# Patient Record
Sex: Male | Born: 1940 | Race: White | Hispanic: No | Marital: Married | Smoking: Former smoker
Health system: Southern US, Community
[De-identification: ages and names within clinical notes are randomized; demographics above are authoritative.]

## PROBLEM LIST (undated history)

## (undated) DIAGNOSIS — M069 Rheumatoid arthritis, unspecified: Secondary | ICD-10-CM

## (undated) DIAGNOSIS — L570 Actinic keratosis: Secondary | ICD-10-CM

## (undated) DIAGNOSIS — I6529 Occlusion and stenosis of unspecified carotid artery: Secondary | ICD-10-CM

## (undated) DIAGNOSIS — H3320 Serous retinal detachment, unspecified eye: Secondary | ICD-10-CM

## (undated) DIAGNOSIS — H5440 Blindness, one eye, unspecified eye: Secondary | ICD-10-CM

## (undated) DIAGNOSIS — G1221 Amyotrophic lateral sclerosis: Secondary | ICD-10-CM

## (undated) DIAGNOSIS — I639 Cerebral infarction, unspecified: Secondary | ICD-10-CM

## (undated) DIAGNOSIS — K5730 Diverticulosis of large intestine without perforation or abscess without bleeding: Secondary | ICD-10-CM

## (undated) DIAGNOSIS — W19XXXA Unspecified fall, initial encounter: Secondary | ICD-10-CM

## (undated) DIAGNOSIS — Z8679 Personal history of other diseases of the circulatory system: Secondary | ICD-10-CM

## (undated) DIAGNOSIS — I10 Essential (primary) hypertension: Secondary | ICD-10-CM

## (undated) DIAGNOSIS — Y92009 Unspecified place in unspecified non-institutional (private) residence as the place of occurrence of the external cause: Secondary | ICD-10-CM

## (undated) DIAGNOSIS — E785 Hyperlipidemia, unspecified: Secondary | ICD-10-CM

## (undated) DIAGNOSIS — K5900 Constipation, unspecified: Secondary | ICD-10-CM

## (undated) DIAGNOSIS — K219 Gastro-esophageal reflux disease without esophagitis: Secondary | ICD-10-CM

## (undated) HISTORY — DX: Gastro-esophageal reflux disease without esophagitis: K21.9

## (undated) HISTORY — PX: EYE SURGERY: SHX253

## (undated) HISTORY — PX: OTHER SURGICAL HISTORY: SHX169

## (undated) HISTORY — DX: Unspecified fall, initial encounter: W19.XXXA

## (undated) HISTORY — DX: Actinic keratosis: L57.0

## (undated) HISTORY — DX: Essential (primary) hypertension: I10

## (undated) HISTORY — DX: Unspecified place in unspecified non-institutional (private) residence as the place of occurrence of the external cause: Y92.009

## (undated) HISTORY — DX: Diverticulosis of large intestine without perforation or abscess without bleeding: K57.30

## (undated) HISTORY — PX: TOTAL KNEE ARTHROPLASTY: SHX125

## (undated) HISTORY — DX: Hyperlipidemia, unspecified: E78.5

## (undated) HISTORY — DX: Personal history of other diseases of the circulatory system: Z86.79

## (undated) HISTORY — DX: Serous retinal detachment, unspecified eye: H33.20

## (undated) HISTORY — DX: Cerebral infarction, unspecified: I63.9

## (undated) HISTORY — DX: Constipation, unspecified: K59.00

## (undated) HISTORY — DX: Blindness, one eye, unspecified eye: H54.40

## (undated) HISTORY — DX: Occlusion and stenosis of unspecified carotid artery: I65.29

## (undated) HISTORY — DX: Rheumatoid arthritis, unspecified: M06.9

---

## 1978-09-24 HISTORY — PX: CATARACT EXTRACTION: SUR2

## 1998-07-04 ENCOUNTER — Inpatient Hospital Stay (HOSPITAL_COMMUNITY): Admission: AD | Admit: 1998-07-04 | Discharge: 1998-07-11 | Payer: Self-pay | Admitting: Rheumatology

## 1998-07-04 ENCOUNTER — Encounter: Payer: Self-pay | Admitting: Rheumatology

## 1998-09-24 HISTORY — PX: JOINT REPLACEMENT: SHX530

## 1999-09-25 HISTORY — PX: CHOLECYSTECTOMY: SHX55

## 2000-03-21 ENCOUNTER — Other Ambulatory Visit: Admission: RE | Admit: 2000-03-21 | Discharge: 2000-03-21 | Payer: Self-pay | Admitting: Orthopedic Surgery

## 2000-06-24 ENCOUNTER — Emergency Department (HOSPITAL_COMMUNITY): Admission: EM | Admit: 2000-06-24 | Discharge: 2000-06-24 | Payer: Self-pay | Admitting: Emergency Medicine

## 2000-06-28 ENCOUNTER — Inpatient Hospital Stay (HOSPITAL_COMMUNITY): Admission: EM | Admit: 2000-06-28 | Discharge: 2000-06-30 | Payer: Self-pay | Admitting: Emergency Medicine

## 2000-06-28 ENCOUNTER — Encounter: Payer: Self-pay | Admitting: General Surgery

## 2000-06-28 ENCOUNTER — Encounter (INDEPENDENT_AMBULATORY_CARE_PROVIDER_SITE_OTHER): Payer: Self-pay | Admitting: *Deleted

## 2001-04-27 ENCOUNTER — Emergency Department (HOSPITAL_COMMUNITY): Admission: EM | Admit: 2001-04-27 | Discharge: 2001-04-27 | Payer: Self-pay | Admitting: Emergency Medicine

## 2001-05-04 ENCOUNTER — Emergency Department (HOSPITAL_COMMUNITY): Admission: EM | Admit: 2001-05-04 | Discharge: 2001-05-04 | Payer: Self-pay | Admitting: Emergency Medicine

## 2001-09-24 HISTORY — PX: CARPAL TUNNEL RELEASE: SHX101

## 2002-06-05 ENCOUNTER — Encounter: Admission: RE | Admit: 2002-06-05 | Discharge: 2002-06-05 | Payer: Self-pay | Admitting: Otolaryngology

## 2002-06-05 ENCOUNTER — Encounter: Payer: Self-pay | Admitting: Otolaryngology

## 2002-08-07 ENCOUNTER — Ambulatory Visit (HOSPITAL_COMMUNITY): Admission: RE | Admit: 2002-08-07 | Discharge: 2002-08-07 | Payer: Self-pay | Admitting: Ophthalmology

## 2002-09-24 HISTORY — PX: RETINAL DETACHMENT SURGERY: SHX105

## 2003-01-23 ENCOUNTER — Ambulatory Visit (HOSPITAL_COMMUNITY): Admission: EM | Admit: 2003-01-23 | Discharge: 2003-01-24 | Payer: Self-pay | Admitting: Ophthalmology

## 2003-01-23 ENCOUNTER — Encounter: Payer: Self-pay | Admitting: Ophthalmology

## 2003-03-15 ENCOUNTER — Ambulatory Visit (HOSPITAL_COMMUNITY): Admission: RE | Admit: 2003-03-15 | Discharge: 2003-03-16 | Payer: Self-pay | Admitting: Ophthalmology

## 2003-06-08 ENCOUNTER — Ambulatory Visit (HOSPITAL_COMMUNITY): Admission: RE | Admit: 2003-06-08 | Discharge: 2003-06-08 | Payer: Self-pay | Admitting: Orthopedic Surgery

## 2003-06-16 ENCOUNTER — Inpatient Hospital Stay (HOSPITAL_COMMUNITY): Admission: RE | Admit: 2003-06-16 | Discharge: 2003-06-21 | Payer: Self-pay | Admitting: Orthopedic Surgery

## 2003-06-16 ENCOUNTER — Encounter: Payer: Self-pay | Admitting: Orthopedic Surgery

## 2003-06-16 ENCOUNTER — Encounter (INDEPENDENT_AMBULATORY_CARE_PROVIDER_SITE_OTHER): Payer: Self-pay | Admitting: *Deleted

## 2003-10-18 ENCOUNTER — Ambulatory Visit (HOSPITAL_COMMUNITY): Admission: RE | Admit: 2003-10-18 | Discharge: 2003-10-19 | Payer: Self-pay | Admitting: Ophthalmology

## 2004-11-17 ENCOUNTER — Inpatient Hospital Stay (HOSPITAL_COMMUNITY): Admission: RE | Admit: 2004-11-17 | Discharge: 2004-11-21 | Payer: Self-pay | Admitting: Orthopedic Surgery

## 2004-12-26 ENCOUNTER — Ambulatory Visit: Payer: Self-pay | Admitting: Internal Medicine

## 2005-04-27 ENCOUNTER — Ambulatory Visit: Payer: Self-pay | Admitting: Internal Medicine

## 2005-05-04 ENCOUNTER — Ambulatory Visit: Payer: Self-pay | Admitting: Internal Medicine

## 2005-05-22 ENCOUNTER — Ambulatory Visit: Payer: Self-pay | Admitting: Internal Medicine

## 2005-06-01 ENCOUNTER — Ambulatory Visit: Payer: Self-pay | Admitting: Internal Medicine

## 2005-06-01 LAB — HM COLONOSCOPY

## 2005-06-08 ENCOUNTER — Ambulatory Visit: Payer: Self-pay | Admitting: Internal Medicine

## 2005-11-13 ENCOUNTER — Ambulatory Visit: Payer: Self-pay | Admitting: Internal Medicine

## 2005-12-18 ENCOUNTER — Encounter: Admission: RE | Admit: 2005-12-18 | Discharge: 2005-12-18 | Payer: Self-pay | Admitting: Internal Medicine

## 2006-01-09 ENCOUNTER — Ambulatory Visit: Payer: Self-pay | Admitting: Internal Medicine

## 2006-01-12 ENCOUNTER — Ambulatory Visit (HOSPITAL_COMMUNITY): Admission: RE | Admit: 2006-01-12 | Discharge: 2006-01-12 | Payer: Self-pay | Admitting: Internal Medicine

## 2006-01-12 ENCOUNTER — Encounter: Admission: RE | Admit: 2006-01-12 | Discharge: 2006-01-12 | Payer: Self-pay | Admitting: Internal Medicine

## 2006-01-12 ENCOUNTER — Encounter: Payer: Self-pay | Admitting: Vascular Surgery

## 2006-01-15 ENCOUNTER — Ambulatory Visit: Payer: Self-pay

## 2006-01-15 ENCOUNTER — Encounter: Payer: Self-pay | Admitting: Cardiology

## 2006-01-30 ENCOUNTER — Ambulatory Visit: Payer: Self-pay | Admitting: Internal Medicine

## 2006-02-07 ENCOUNTER — Ambulatory Visit: Payer: Self-pay

## 2006-02-16 ENCOUNTER — Encounter: Admission: RE | Admit: 2006-02-16 | Discharge: 2006-02-16 | Payer: Self-pay | Admitting: *Deleted

## 2006-02-27 ENCOUNTER — Ambulatory Visit: Payer: Self-pay | Admitting: Internal Medicine

## 2006-03-01 ENCOUNTER — Encounter (INDEPENDENT_AMBULATORY_CARE_PROVIDER_SITE_OTHER): Payer: Self-pay | Admitting: Cardiology

## 2006-03-01 ENCOUNTER — Ambulatory Visit (HOSPITAL_COMMUNITY): Admission: RE | Admit: 2006-03-01 | Discharge: 2006-03-01 | Payer: Self-pay | Admitting: Cardiology

## 2006-04-01 ENCOUNTER — Ambulatory Visit: Payer: Self-pay | Admitting: Internal Medicine

## 2006-04-10 ENCOUNTER — Ambulatory Visit: Payer: Self-pay | Admitting: Internal Medicine

## 2006-04-25 ENCOUNTER — Ambulatory Visit: Payer: Self-pay | Admitting: Internal Medicine

## 2006-05-10 ENCOUNTER — Ambulatory Visit: Payer: Self-pay | Admitting: Internal Medicine

## 2006-05-24 ENCOUNTER — Ambulatory Visit: Payer: Self-pay | Admitting: Internal Medicine

## 2006-05-30 ENCOUNTER — Ambulatory Visit: Payer: Self-pay | Admitting: Internal Medicine

## 2006-07-03 ENCOUNTER — Ambulatory Visit: Payer: Self-pay | Admitting: Internal Medicine

## 2006-08-02 ENCOUNTER — Ambulatory Visit: Payer: Self-pay | Admitting: Internal Medicine

## 2006-08-20 ENCOUNTER — Encounter (INDEPENDENT_AMBULATORY_CARE_PROVIDER_SITE_OTHER): Payer: Self-pay | Admitting: Cardiology

## 2006-08-20 ENCOUNTER — Ambulatory Visit (HOSPITAL_COMMUNITY): Admission: RE | Admit: 2006-08-20 | Discharge: 2006-08-20 | Payer: Self-pay | Admitting: Cardiology

## 2006-08-21 ENCOUNTER — Emergency Department (HOSPITAL_COMMUNITY): Admission: EM | Admit: 2006-08-21 | Discharge: 2006-08-21 | Payer: Self-pay | Admitting: Emergency Medicine

## 2006-10-30 ENCOUNTER — Ambulatory Visit: Payer: Self-pay | Admitting: Internal Medicine

## 2007-03-24 ENCOUNTER — Encounter: Payer: Self-pay | Admitting: Internal Medicine

## 2007-03-24 DIAGNOSIS — K5730 Diverticulosis of large intestine without perforation or abscess without bleeding: Secondary | ICD-10-CM

## 2007-03-24 DIAGNOSIS — Z8679 Personal history of other diseases of the circulatory system: Secondary | ICD-10-CM | POA: Insufficient documentation

## 2007-03-24 DIAGNOSIS — M069 Rheumatoid arthritis, unspecified: Secondary | ICD-10-CM | POA: Insufficient documentation

## 2007-03-24 DIAGNOSIS — K219 Gastro-esophageal reflux disease without esophagitis: Secondary | ICD-10-CM

## 2007-03-24 HISTORY — DX: Diverticulosis of large intestine without perforation or abscess without bleeding: K57.30

## 2007-03-24 HISTORY — DX: Personal history of other diseases of the circulatory system: Z86.79

## 2007-03-24 HISTORY — DX: Rheumatoid arthritis, unspecified: M06.9

## 2007-03-24 HISTORY — DX: Gastro-esophageal reflux disease without esophagitis: K21.9

## 2007-05-06 ENCOUNTER — Ambulatory Visit: Payer: Self-pay | Admitting: Internal Medicine

## 2007-05-06 LAB — CONVERTED CEMR LAB
AST: 24 units/L (ref 0–37)
Albumin: 3.3 g/dL — ABNORMAL LOW (ref 3.5–5.2)
Basophils Absolute: 0 10*3/uL (ref 0.0–0.1)
Bilirubin Urine: NEGATIVE
Bilirubin, Direct: 0.1 mg/dL (ref 0.0–0.3)
Blood in Urine, dipstick: NEGATIVE
Calcium: 8.7 mg/dL (ref 8.4–10.5)
Chloride: 108 meq/L (ref 96–112)
Cholesterol: 115 mg/dL (ref 0–200)
Eosinophils Absolute: 0.3 10*3/uL (ref 0.0–0.6)
Eosinophils Relative: 4.2 % (ref 0.0–5.0)
GFR calc Af Amer: 71 mL/min
GFR calc non Af Amer: 59 mL/min
Glucose, Bld: 113 mg/dL — ABNORMAL HIGH (ref 70–99)
Glucose, Urine, Semiquant: NEGATIVE
Ketones, urine, test strip: NEGATIVE
Lymphocytes Relative: 32.3 % (ref 12.0–46.0)
MCHC: 34 g/dL (ref 30.0–36.0)
MCV: 92.4 fL (ref 78.0–100.0)
Neutro Abs: 3.5 10*3/uL (ref 1.4–7.7)
Neutrophils Relative %: 55.3 % (ref 43.0–77.0)
PSA: 1.72 ng/mL (ref 0.10–4.00)
Platelets: 274 10*3/uL (ref 150–400)
Protein, U semiquant: NEGATIVE
RBC: 3.79 M/uL — ABNORMAL LOW (ref 4.22–5.81)
Sodium: 140 meq/L (ref 135–145)
Specific Gravity, Urine: 1.015
Total CHOL/HDL Ratio: 4
Triglycerides: 72 mg/dL (ref 0–149)
pH: 6.5

## 2007-06-05 ENCOUNTER — Ambulatory Visit: Payer: Self-pay | Admitting: Internal Medicine

## 2007-06-18 ENCOUNTER — Encounter: Payer: Self-pay | Admitting: Internal Medicine

## 2007-08-08 ENCOUNTER — Ambulatory Visit: Payer: Self-pay | Admitting: Internal Medicine

## 2007-08-08 ENCOUNTER — Encounter: Payer: Self-pay | Admitting: Internal Medicine

## 2007-08-08 ENCOUNTER — Ambulatory Visit: Payer: Self-pay | Admitting: Cardiology

## 2007-08-08 ENCOUNTER — Inpatient Hospital Stay (HOSPITAL_COMMUNITY): Admission: EM | Admit: 2007-08-08 | Discharge: 2007-08-12 | Payer: Self-pay | Admitting: Emergency Medicine

## 2007-08-11 ENCOUNTER — Ambulatory Visit: Payer: Self-pay | Admitting: *Deleted

## 2007-08-11 ENCOUNTER — Encounter: Payer: Self-pay | Admitting: Internal Medicine

## 2007-08-15 ENCOUNTER — Inpatient Hospital Stay (HOSPITAL_COMMUNITY): Admission: RE | Admit: 2007-08-15 | Discharge: 2007-08-18 | Payer: Self-pay | Admitting: *Deleted

## 2007-08-15 ENCOUNTER — Encounter (INDEPENDENT_AMBULATORY_CARE_PROVIDER_SITE_OTHER): Payer: Self-pay | Admitting: *Deleted

## 2007-08-15 DIAGNOSIS — I6529 Occlusion and stenosis of unspecified carotid artery: Secondary | ICD-10-CM

## 2007-08-15 HISTORY — PX: CAROTID ENDARTERECTOMY: SUR193

## 2007-08-15 HISTORY — DX: Occlusion and stenosis of unspecified carotid artery: I65.29

## 2007-08-17 ENCOUNTER — Encounter: Payer: Self-pay | Admitting: Vascular Surgery

## 2007-08-27 ENCOUNTER — Ambulatory Visit: Payer: Self-pay | Admitting: Internal Medicine

## 2007-08-27 DIAGNOSIS — E785 Hyperlipidemia, unspecified: Secondary | ICD-10-CM

## 2007-08-27 HISTORY — DX: Hyperlipidemia, unspecified: E78.5

## 2007-08-27 LAB — CONVERTED CEMR LAB
ALT: 24 units/L (ref 0–53)
AST: 23 units/L (ref 0–37)
Bilirubin, Direct: 0.2 mg/dL (ref 0.0–0.3)
Cholesterol: 139 mg/dL (ref 0–200)
HDL: 23.3 mg/dL — ABNORMAL LOW (ref >39.0)
Total Bilirubin: 0.7 mg/dL (ref 0.3–1.2)
Total Protein: 7.2 g/dL (ref 6.0–8.3)
Triglycerides: 94 mg/dL (ref 0–149)
VLDL: 19 mg/dL (ref 0–40)

## 2007-08-28 ENCOUNTER — Ambulatory Visit: Payer: Self-pay | Admitting: *Deleted

## 2007-08-28 ENCOUNTER — Encounter: Payer: Self-pay | Admitting: Internal Medicine

## 2007-08-29 ENCOUNTER — Telehealth: Payer: Self-pay | Admitting: Internal Medicine

## 2007-09-03 ENCOUNTER — Telehealth: Payer: Self-pay | Admitting: Internal Medicine

## 2007-09-03 ENCOUNTER — Telehealth (INDEPENDENT_AMBULATORY_CARE_PROVIDER_SITE_OTHER): Payer: Self-pay | Admitting: *Deleted

## 2007-09-25 HISTORY — PX: SHOULDER SURGERY: SHX246

## 2007-09-30 ENCOUNTER — Ambulatory Visit: Payer: Self-pay | Admitting: Internal Medicine

## 2007-10-16 ENCOUNTER — Telehealth: Payer: Self-pay | Admitting: Internal Medicine

## 2008-01-07 ENCOUNTER — Ambulatory Visit: Payer: Self-pay | Admitting: Internal Medicine

## 2008-02-03 ENCOUNTER — Telehealth (INDEPENDENT_AMBULATORY_CARE_PROVIDER_SITE_OTHER): Payer: Self-pay | Admitting: *Deleted

## 2008-03-11 ENCOUNTER — Ambulatory Visit: Payer: Self-pay | Admitting: *Deleted

## 2008-04-14 ENCOUNTER — Encounter: Payer: Self-pay | Admitting: Internal Medicine

## 2008-04-29 ENCOUNTER — Encounter: Admission: RE | Admit: 2008-04-29 | Discharge: 2008-04-29 | Payer: Self-pay | Admitting: Orthopedic Surgery

## 2008-06-17 ENCOUNTER — Emergency Department (HOSPITAL_COMMUNITY): Admission: EM | Admit: 2008-06-17 | Discharge: 2008-06-17 | Payer: Self-pay | Admitting: Emergency Medicine

## 2008-06-17 ENCOUNTER — Telehealth: Payer: Self-pay | Admitting: Internal Medicine

## 2008-06-22 ENCOUNTER — Ambulatory Visit: Payer: Self-pay | Admitting: Internal Medicine

## 2008-06-22 DIAGNOSIS — K5900 Constipation, unspecified: Secondary | ICD-10-CM

## 2008-06-22 HISTORY — DX: Constipation, unspecified: K59.00

## 2008-07-01 ENCOUNTER — Telehealth: Payer: Self-pay | Admitting: Internal Medicine

## 2008-07-22 ENCOUNTER — Telehealth: Payer: Self-pay | Admitting: Internal Medicine

## 2008-09-09 ENCOUNTER — Ambulatory Visit: Payer: Self-pay | Admitting: *Deleted

## 2008-11-22 ENCOUNTER — Telehealth: Payer: Self-pay | Admitting: Internal Medicine

## 2008-12-14 ENCOUNTER — Ambulatory Visit: Payer: Self-pay | Admitting: Internal Medicine

## 2008-12-14 DIAGNOSIS — I10 Essential (primary) hypertension: Secondary | ICD-10-CM | POA: Insufficient documentation

## 2008-12-14 HISTORY — DX: Essential (primary) hypertension: I10

## 2008-12-23 ENCOUNTER — Telehealth: Payer: Self-pay | Admitting: Internal Medicine

## 2009-01-12 ENCOUNTER — Encounter: Payer: Self-pay | Admitting: Internal Medicine

## 2009-03-15 ENCOUNTER — Ambulatory Visit: Payer: Self-pay | Admitting: Internal Medicine

## 2009-03-31 ENCOUNTER — Ambulatory Visit: Payer: Self-pay | Admitting: Internal Medicine

## 2009-03-31 DIAGNOSIS — C44300 Unspecified malignant neoplasm of skin of unspecified part of face: Secondary | ICD-10-CM | POA: Insufficient documentation

## 2009-03-31 DIAGNOSIS — C44309 Unspecified malignant neoplasm of skin of other parts of face: Secondary | ICD-10-CM | POA: Insufficient documentation

## 2009-06-07 ENCOUNTER — Encounter: Payer: Self-pay | Admitting: Internal Medicine

## 2009-07-15 ENCOUNTER — Ambulatory Visit: Payer: Self-pay | Admitting: Vascular Surgery

## 2009-07-15 ENCOUNTER — Encounter: Payer: Self-pay | Admitting: Internal Medicine

## 2009-08-24 ENCOUNTER — Ambulatory Visit: Payer: Self-pay | Admitting: Internal Medicine

## 2009-10-13 ENCOUNTER — Encounter: Payer: Self-pay | Admitting: Internal Medicine

## 2009-10-21 ENCOUNTER — Telehealth: Payer: Self-pay | Admitting: Internal Medicine

## 2009-11-10 ENCOUNTER — Telehealth: Payer: Self-pay | Admitting: Internal Medicine

## 2010-02-23 ENCOUNTER — Ambulatory Visit: Payer: Self-pay | Admitting: Internal Medicine

## 2010-02-23 LAB — CONVERTED CEMR LAB
AST: 23 units/L (ref 0–37)
Albumin: 3.8 g/dL (ref 3.5–5.2)
Alkaline Phosphatase: 33 units/L — ABNORMAL LOW (ref 39–117)
Basophils Relative: 0.3 % (ref 0.0–3.0)
Blood in Urine, dipstick: NEGATIVE
CO2: 29 meq/L (ref 19–32)
Calcium: 9.1 mg/dL (ref 8.4–10.5)
Eosinophils Absolute: 0.4 10*3/uL (ref 0.0–0.7)
Glucose, Bld: 83 mg/dL (ref 70–99)
Glucose, Urine, Semiquant: NEGATIVE
HCT: 36.3 % — ABNORMAL LOW (ref 39.0–52.0)
HDL: 31.5 mg/dL — ABNORMAL LOW (ref >39.00)
Hemoglobin: 11.9 g/dL — ABNORMAL LOW (ref 13.0–17.0)
Lymphocytes Relative: 21.9 % (ref 12.0–46.0)
Lymphs Abs: 1.9 10*3/uL (ref 0.7–4.0)
MCHC: 32.9 g/dL (ref 30.0–36.0)
Monocytes Relative: 13.3 % — ABNORMAL HIGH (ref 3.0–12.0)
Neutro Abs: 5.3 10*3/uL (ref 1.4–7.7)
Nitrite: NEGATIVE
Potassium: 4.8 meq/L (ref 3.5–5.1)
RBC: 3.66 M/uL — ABNORMAL LOW (ref 4.22–5.81)
Sodium: 143 meq/L (ref 135–145)
Specific Gravity, Urine: >=1.03
TSH: 1.68 microintl units/mL (ref 0.35–5.50)
Total CHOL/HDL Ratio: 4
Total Protein: 7.1 g/dL (ref 6.0–8.3)
WBC Urine, dipstick: NEGATIVE
pH: 5.5

## 2010-03-02 ENCOUNTER — Ambulatory Visit: Payer: Self-pay | Admitting: Internal Medicine

## 2010-06-29 ENCOUNTER — Telehealth: Payer: Self-pay | Admitting: Internal Medicine

## 2010-07-27 ENCOUNTER — Ambulatory Visit: Payer: Self-pay | Admitting: Internal Medicine

## 2010-07-31 ENCOUNTER — Ambulatory Visit: Payer: Self-pay | Admitting: Vascular Surgery

## 2010-10-26 NOTE — Progress Notes (Signed)
Summary: Pt taking generic Fenofibrate. Having burning sensation in face.  Phone Note Call from Patient Call back at Home Phone [phone]   Caller: spouse - Gaynelle Adu Summary of Call: Pts spouse called and said that pt is taking generic Fenofibrate 134mg  once a day. Pt says that his face to have a burning sensation like sunburn. Pt is wondering if this is normal side effect or if he should discontinue med. Pls advise.  Initial call taken by: Lucy Antigua,  June 29, 2010 11:37 AM  Follow-up for Phone Call        suggest  the patient stopped the medication for two to 3 weeks to see if the burning resolves;  if the burning resolves resume the medication to see if the burning recurs Follow-up by: Gordy Savers  MD,  June 29, 2010 5:21 PM  Additional Follow-up for Phone Call Additional follow up Details #1::        attempt to call - ans mach - LMTCB if questions - instructed to stop med x2-3 wks then restart - call in burning restarts. KIK Additional Follow-up by: Duard Brady LPN,  June 30, 2010 11:31 AM

## 2010-10-26 NOTE — Progress Notes (Signed)
Summary: constipation  Phone Note Call from Patient   Caller: Spouse Call For: Brett Savers  MD Summary of Call: c/o constipation, using Dulcolax stool softener, but needs something more. Initial call taken by: Raechel Ache, RN,  November 10, 2009 11:58 AM  Follow-up for Phone Call        miralax (polyethylene glycol) 17 gm (one scoop)  with 8 oz of water twice daily as needed Follow-up by: Brett Savers  MD,  November 10, 2009 12:45 PM  Additional Follow-up for Phone Call Additional follow up Details #1::        Rx Called In Additional Follow-up by: Raechel Ache, RN,  November 10, 2009 1:50 PM    New/Updated Medications: MIRALAX  POWD (POLYETHYLENE GLYCOL 3350) 1 scoop in 8oz water two times a day Prescriptions: MIRALAX  POWD (POLYETHYLENE GLYCOL 3350) 1 scoop in 8oz water two times a day  #17 x 3   Entered by:   Raechel Ache, RN   Authorized by:   Brett Savers  MD   Signed by:   Raechel Ache, RN on 11/10/2009   Method used:   Electronically to        [facility] [phone]* (retail)       [address]       [place], [place]  [address]       Ph: [phone] or [phone]       Fax: [phone]   RxID:   [id number]

## 2010-10-26 NOTE — Progress Notes (Signed)
Summary: Requesting Rx  Phone Note Call from Patient Call back at Home Phone [phone]   Caller: Spouse - Sherilyn Cooter Reason for Call: Referral Summary of Call: Pt was given sample of medicine to help build good cholesterol. Would like a  rx for generic fenofibrate capsules since this is cheaper with my insurance. Initial call taken by: Trixie Dredge,  October 21, 2009 4:31 PM  Follow-up for Phone Call        Rx Called In Follow-up by: Raechel Ache, RN,  October 21, 2009 5:10 PM    New/Updated Medications: FENOFIBRATE MICRONIZED 134 MG CAPS (FENOFIBRATE MICRONIZED) 1 once daily Prescriptions: FENOFIBRATE MICRONIZED 134 MG CAPS (FENOFIBRATE MICRONIZED) 1 once daily  #90 x 3   Entered by:   Raechel Ache, RN   Authorized by:   Gordy Savers  MD   Signed by:   Raechel Ache, RN on 10/21/2009   Method used:   Electronically to        [facility] [phone]* (retail)       [address]       [place], [place]  [address]       Ph: [phone] or [phone]       Fax: [phone]   RxID:   [id number]  OK  145 mg #90 one daily

## 2010-10-26 NOTE — Letter (Signed)
Summary: Rheumatology-Dr. Stacey Drain  Rheumatology-Dr. Stacey Drain   Imported By: Maryln Gottron 11/17/2009 12:20:36  _____________________________________________________________________  External Attachment:    Type:   Image     Comment:   External Document

## 2010-10-26 NOTE — Assessment & Plan Note (Signed)
Summary: cpx/mm   Vital Signs:  Patient profile:   70 year old male Height:      71.25 inches Weight:      191 pounds BMI:     26.55 Temp:     98.1 degrees F oral BP sitting:   140 / 70  (right arm) Cuff size:   regular  Vitals Entered By: Duard Brady LPN (March 02, 1609 9:07 AM) CC: cpx - doing well Is Patient Diabetic? No   CC:  cpx - doing well.  History of Present Illness: 70 year old patient who is seen today for a comprehensive evaluation.  He is followed closely by rheumatology for rheumatoid arthritis.  He has mild dyslipidemia with a low HDL cholesterol.  He is on fibroid therapy.  He has a history of cerebrovascular disease, gastroesophageal reflux disease and treated hypertension.  Laboratory studies were reviewed Here for Medicare AWV:  1.   Risk factors based on Past M, S, F history: for vascular risk factors include hyperlipidemia, and hypertension.  He has a prior history of cerebrovascular disease 2.   Physical Activities: remains quite active in spite of his arthritis.  No real exercise limitations although tires easily 3.   Depression/mood: no history depression 4.   Hearing: no deficits 5.   ADL's: completely independent in all aspects of daily living 6.   Fall Risk: low 7.   Home Safety: no problems identified 8.   Height, weight, &visual acuity:no change in his height or weight.  Blind right eye 9.   Counseling: regular exercise, heart healthy diet.  Discussed 10.   Labs ordered based on risk factors: laboratory profile, including lipid panel, PSA, reviewed 11.           Referral Coordination-rheumatologic follow-up as planned 12.           Care Plan- heart healthy diet;  regular exercise regimen encouraged 13.            Cognitive Assessment- alert and oriented, with normal affect   Preventive Screening-Counseling & Management  Alcohol-Tobacco     Smoking Status: quit  Allergies: 1)  ! * Dopamine  Past History:  Past Medical  History: Reviewed history from 12/14/2008 and no changes required. Rheumatoid arthritis Cerebrovascular accident, hx of Diverticulosis, colon GERD blindness right eye Hyperlipidemia status post right hemispheric stroke November 2008 history retinal detachment elevated liver function studies Hypertension  Past Surgical History: Reviewed history from 01/07/2008 and no changes required. Cholecystectomy 2001 status post bilateral total knee replacement history retinal detachment, right eye status post left carpal tunnel release 2003 colonoscopy.  2006 status post right carotid endarterectomy November 2008 2-D echocardiogram April 2007: ejection fraction 60 to 65%; no wall motion abnormalities  Family History: Reviewed history from 06/05/2007 and no changes required. father died age 56, thoracic aneurysm mother died at 34 3 sisters, one status post bypass surgery  Social History: Reviewed history and no changes required. Married 5 grandchildren   Review of Systems  The patient denies anorexia, fever, weight loss, weight gain, vision loss, decreased hearing, hoarseness, chest pain, syncope, dyspnea on exertion, peripheral edema, prolonged cough, headaches, hemoptysis, abdominal pain, melena, hematochezia, severe indigestion/heartburn, hematuria, incontinence, genital sores, muscle weakness, suspicious skin lesions, transient blindness, difficulty walking, depression, unusual weight change, abnormal bleeding, enlarged lymph nodes, angioedema, breast masses, and testicular masses.    Physical Exam  General:  Well-developed,well-nourished,in no acute distress; alert,appropriate and cooperative throughout examination; 130/74 Head:  Normocephalic and atraumatic without obvious abnormalities. No  apparent alopecia or balding. Eyes:  No corneal or conjunctival inflammation noted. EOMI. Perrla. Funduscopic exam benign, without hemorrhages, exudates or papilledema. Vision grossly  normal. Ears:  External ear exam shows no significant lesions or deformities.  Otoscopic examination reveals clear canals, tympanic membranes are intact bilaterally without bulging, retraction, inflammation or discharge. Hearing is grossly normal bilaterally. Nose:  External nasal examination shows no deformity or inflammation. Nasal mucosa are pink and moist without lesions or exudates. Mouth:  Oral mucosa and oropharynx without lesions or exudates.  Teeth in good repair. Neck:  No deformities, masses, or tenderness noted. Chest Wall:  No deformities, masses, tenderness or gynecomastia noted. Breasts:  No masses or gynecomastia noted Lungs:  Normal respiratory effort, chest expands symmetrically. Lungs are clear to auscultation, no crackles or wheezes. Heart:  Normal rate and regular rhythm. S1 and S2 normal without gallop, murmur, click, rub or other extra sounds. Abdomen:  Bowel sounds positive,abdomen soft and non-tender without masses, organomegaly or hernias noted. Rectal:  No external abnormalities noted. Normal sphincter tone. No rectal masses or tenderness. Genitalia:  Testes bilaterally descended without nodularity, tenderness or masses. No scrotal masses or lesions. No penis lesions or urethral discharge. Prostate:  2+ enlarged.  2+ enlarged.   Msk:  No deformity or scoliosis noted of thoracic or lumbar spine.   Pulses:  R and L carotid,radial,femoral,dorsalis pedis and posterior tibial pulses are full and equal bilaterally Extremities:  No clubbing, cyanosis, edema, or deformity noted with normal full range of motion of all joints.   Neurologic:  No cranial nerve deficits noted. Station and gait are normal. Plantar reflexes are down-going bilaterally. DTRs are symmetrical throughout. Sensory, motor and coordinative functions appear intact. Skin:  Intact without suspicious lesions or rashes Cervical Nodes:  No lymphadenopathy noted Axillary Nodes:  No palpable lymphadenopathy Inguinal  Nodes:  No significant adenopathy Psych:  Cognition and judgment appear intact. Alert and cooperative with normal attention span and concentration. No apparent delusions, illusions, hallucinations   Impression & Recommendations:  Problem # 1:  HYPERTENSION (ICD-401.9)  The following medications were removed from the medication list:    Chlorthalidone 25 Mg Tabs (Chlorthalidone) ..... One daily  Orders: EKG w/ Interpretation (93000)  The following medications were removed from the medication list:    Chlorthalidone 25 Mg Tabs (Chlorthalidone) ..... One daily  Problem # 2:  HYPERLIPIDEMIA (ICD-272.4)  The following medications were removed from the medication list:    Tricor 145 Mg Tabs (Fenofibrate) .Marland Kitchen... 1 tablet by mouth once a day His updated medication list for this problem includes:    Fenofibrate Micronized 134 Mg Caps (Fenofibrate micronized) .Marland Kitchen... 1 once daily  The following medications were removed from the medication list:    Tricor 145 Mg Tabs (Fenofibrate) .Marland Kitchen... 1 tablet by mouth once a day His updated medication list for this problem includes:    Fenofibrate Micronized 134 Mg Caps (Fenofibrate micronized) .Marland Kitchen... 1 once daily  Problem # 3:  DIVERTICULOSIS, COLON (ICD-562.10)  Problem # 4:  GERD (ICD-530.81)  His updated medication list for this problem includes:    Pepcid 20 Mg Tabs (Famotidine) .Marland Kitchen... 1 once daily  His updated medication list for this problem includes:    Pepcid 20 Mg Tabs (Famotidine) .Marland Kitchen... 1 once daily  Complete Medication List: 1)  Humira 40 Mg/0.6ml Kit (Adalimumab) .... Q week 2)  Bayer Aspirin 325 Mg Tabs (Aspirin) .Marland Kitchen.. 1 once daily 3)  Foltx 2.5-25-2 Mg Tabs (Fa-pyridoxine-cyancobalamin) .Marland Kitchen.. 1 once daily 4)  Xalatan  0.005 % Soln (Latanoprost) .... Ou once daily 5)  Timoptic 0.5 % Soln (Timolol maleate) .... Ou two times a day 6)  Methotrexate (anti-rheumatic) 2.5 Mg Tabs (Methotrexate (anti-rheumatic)) .... As dir 7)   Oxycodone-acetaminophen 5-325 Mg Tabs (Oxycodone-acetaminophen) .Marland Kitchen.. 1 q4h as needed 8)  Pepcid 20 Mg Tabs (Famotidine) .Marland Kitchen.. 1 once daily 9)  Fenofibrate Micronized 134 Mg Caps (Fenofibrate micronized) .Marland Kitchen.. 1 once daily 10)  Miralax Powd (Polyethylene glycol 3350) .Marland Kitchen.. 1 scoop in 8oz water two times a day  Other Orders: First annual wellness visit with prevention plan  (Z6109) Dermatology Referral (Derma)  Patient Instructions: 1)  Please schedule a follow-up appointment in 6 months. 2)  Limit your Sodium (Salt) to less than 2 grams a day(slightly less than 1/2 a teaspoon) to prevent fluid retention, swelling, or worsening of symptoms. 3)  It is important that you exercise regularly at least 20 minutes 5 times a week. If you develop chest pain, have severe difficulty breathing, or feel very tired , stop exercising immediately and seek medical attention. 4)  Check your Blood Pressure regularly. If it is above:150/90 you should make an appointment. Prescriptions: MIRALAX  POWD (POLYETHYLENE GLYCOL 3350) 1 scoop in 8oz water two times a day  #17 x 3   Entered and Authorized by:   Gordy Savers  MD   Signed by:   Gordy Savers  MD on 03/02/2010   Method used:   Electronically to        [facility] [phone]* (retail)       [address]       [place], [place]  [address]       Ph: [phone] or [phone]       Fax: [phone]   RxID:   [id number] FENOFIBRATE MICRONIZED 134 MG CAPS (FENOFIBRATE MICRONIZED) 1 once daily  #90 x 6   Entered and Authorized by:   Gordy Savers  MD   Signed by:   Gordy Savers  MD on 03/02/2010   Method used:   Electronically to        [facility] [phone]* (retail)       [address]       [place], [place]  [address]       Ph: [phone] or [phone]       Fax: [phone]   RxID:   [id number] FOLTX 2.5-25-2 MG  TABS (FA-PYRIDOXINE-CYANCOBALAMIN)  1 once daily  #90 x 6   Entered and Authorized by:   Gordy Savers  MD   Signed by:   Gordy Savers  MD on 03/02/2010   Method used:   Electronically to        [facility] [phone]* (retail)       [address]       [place], [place]  [address]       Ph: [phone] or [phone]       Fax: [phone]   RxID:   [id number]

## 2010-10-26 NOTE — Assessment & Plan Note (Signed)
Summary: tetanus ? /dm   Vital Signs:  Patient profile:   70 year old male Weight:      193 pounds Temp:     98.2 degrees F oral BP sitting:   130 / 70  (right arm) Cuff size:   regular  Vitals Entered By: Duard Brady LPN (July 27, 2010 4:25 PM) CC: c/o hit hamd with claw hammer - needs tetnaus Is Patient Diabetic? No   CC:  c/o hit hamd with claw hammer - needs tetnaus.  History of Present Illness: 70 year old patient this traumatized his left wrist area twice over the past week, resulting in skin breakage, and soft tissue ecchymoses.  Is requesting a tetanus shot since it has been over 10 years.  He has a history of RA and is on immunosuppressant therapy.  He has treated hypertension and dyslipidemia.  He states that he has discontinued fenofibrate due to possible side effects  Allergies: 1)  ! * Dopamine  Review of Systems       The patient complains of suspicious skin lesions.  The patient denies anorexia, fever, weight loss, weight gain, vision loss, decreased hearing, hoarseness, chest pain, syncope, dyspnea on exertion, peripheral edema, prolonged cough, headaches, hemoptysis, abdominal pain, melena, hematochezia, severe indigestion/heartburn, hematuria, incontinence, genital sores, muscle weakness, transient blindness, difficulty walking, depression, unusual weight change, abnormal bleeding, enlarged lymph nodes, angioedema, breast masses, and testicular masses.    Physical Exam  General:  Well-developed,well-nourished,in no acute distress; alert,appropriate and cooperative throughout examination Mouth:  Oral mucosa and oropharynx without lesions or exudates.  Teeth in good repair. Neck:  No deformities, masses, or tenderness noted. Lungs:  Normal respiratory effort, chest expands symmetrically. Lungs are clear to auscultation, no crackles or wheezes. Heart:  Normal rate and regular rhythm. S1 and S2 normal without gallop, murmur, click, rub or other extra  sounds. Skin:  areas of soft tissue trauma  on the dorsum of the left hand and wrist.  No signs of wound infection   Impression & Recommendations:  Problem # 1:  CEREBROVASCULAR ACCIDENT, HX OF (ICD-V12.50)  Problem # 2:  HYPERLIPIDEMIA (ICD-272.4)  His updated medication list for this problem includes:    Fenofibrate Micronized 134 Mg Caps (Fenofibrate micronized) .Marland Kitchen... 1 once daily  His updated medication list for this problem includes:    Fenofibrate Micronized 134 Mg Caps (Fenofibrate micronized) .Marland Kitchen... 1 once daily  Complete Medication List: 1)  Humira 40 Mg/0.35ml Kit (Adalimumab) .... Q week 2)  Bayer Aspirin 325 Mg Tabs (Aspirin) .Marland Kitchen.. 1 once daily 3)  Foltx 2.5-25-2 Mg Tabs (Fa-pyridoxine-cyancobalamin) .Marland Kitchen.. 1 once daily 4)  Xalatan 0.005 % Soln (Latanoprost) .... Ou once daily 5)  Timoptic 0.5 % Soln (Timolol maleate) .... Ou two times a day 6)  Methotrexate (anti-rheumatic) 2.5 Mg Tabs (Methotrexate (anti-rheumatic)) .... As dir 7)  Oxycodone-acetaminophen 5-325 Mg Tabs (Oxycodone-acetaminophen) .Marland Kitchen.. 1 q4h as needed 8)  Pepcid 20 Mg Tabs (Famotidine) .Marland Kitchen.. 1 once daily 9)  Fenofibrate Micronized 134 Mg Caps (Fenofibrate micronized) .Marland Kitchen.. 1 once daily 10)  Miralax Powd (Polyethylene glycol 3350) .Marland Kitchen.. 1 scoop in 8oz water two times a day  Other Orders: Tdap => 12yrs IM ([address]109) Admin 1st Vaccine (60454)  Patient Instructions: 1)  Please schedule a follow-up appointment in 4 months. 2)  Limit your Sodium (Salt). 3)  It is important that you exercise regularly at least 20 minutes 5 times a week. If you develop chest pain, have severe difficulty breathing, or feel very tired ,  stop exercising immediately and seek medical attention.   Orders Added: 1)  Est. Patient Level III [16109] 2)  Tdap => 92yrs IM [90715] 3)  Admin 1st Vaccine [60454]   Immunizations Administered:  Tetanus Vaccine:    Vaccine Type: Tdap    Site: left deltoid    Mfr: GlaxoSmithKline    Dose: 0.5  ml    Route: IM    Given by: Duard Brady LPN    Exp. Date: 07/13/2012    Lot #: [id number]    VIS given: 08/11/08 version given July 27, 2010.    Physician counseled: yes   Immunizations Administered:  Tetanus Vaccine:    Vaccine Type: Tdap    Site: left deltoid    Mfr: GlaxoSmithKline    Dose: 0.5 ml    Route: IM    Given by: Duard Brady LPN    Exp. Date: 07/13/2012    Lot #: [id number]    VIS given: 08/11/08 version given July 27, 2010.    Physician counseled: yes

## 2010-11-22 ENCOUNTER — Encounter: Payer: Self-pay | Admitting: Internal Medicine

## 2010-11-23 ENCOUNTER — Encounter: Payer: Self-pay | Admitting: Internal Medicine

## 2010-11-23 ENCOUNTER — Ambulatory Visit (INDEPENDENT_AMBULATORY_CARE_PROVIDER_SITE_OTHER): Payer: MEDICARE | Admitting: Internal Medicine

## 2010-11-23 DIAGNOSIS — I10 Essential (primary) hypertension: Secondary | ICD-10-CM

## 2010-11-23 DIAGNOSIS — Z8679 Personal history of other diseases of the circulatory system: Secondary | ICD-10-CM

## 2010-11-23 DIAGNOSIS — E785 Hyperlipidemia, unspecified: Secondary | ICD-10-CM

## 2010-11-23 MED ORDER — FENOFIBRATE MICRONIZED 134 MG PO CAPS: 134.0000 mg | ORAL_CAPSULE | Freq: Every day | ORAL | Status: DC

## 2010-11-23 MED ORDER — OXYCODONE-ACETAMINOPHEN 5-325 MG PO TABS: 1.0000 | ORAL_TABLET | ORAL | Status: DC | PRN

## 2010-11-23 MED ORDER — PRAVASTATIN SODIUM 40 MG PO TABS: 40.0000 mg | ORAL_TABLET | Freq: Every evening | ORAL | Status: DC

## 2010-11-23 NOTE — Progress Notes (Signed)
  Subjective:    Patient ID: Brett Castaneda, male    DOB: [birth year], 70 y.o.   MRN: [id number]  HPI   70 year old patient who has a history of to revascularize he is. He also has a history of rheumatoid arthritis followed by Dr. Sharyn Lull. He has been on fiber therapy for dyslipidemia in the past but apparently has not been on statin therapy. In view of his history is for vascular disease the risks and benefits of statin therapy discussed. He has a history of hypertension which has been well-controlled. His RA he seems stable on his aggressive medical treatment. He has gastroesophageal reflux disease controlled on Pepcid    Review of Systems  Constitutional: Negative for fever, chills, appetite change and fatigue.  HENT: Negative for hearing loss, ear pain, congestion, sore throat, trouble swallowing, neck stiffness, dental problem, voice change and tinnitus.   Eyes: Negative for pain, discharge and visual disturbance.  Respiratory: Negative for cough, chest tightness, wheezing and stridor.   Cardiovascular: Negative for chest pain, palpitations and leg swelling.  Gastrointestinal: Negative for nausea, vomiting, abdominal pain, diarrhea, constipation, blood in stool and abdominal distention.  Genitourinary: Negative for urgency, hematuria, flank pain, discharge, difficulty urinating and genital sores.  Musculoskeletal: Positive for arthralgias. Negative for myalgias, back pain, joint swelling and gait problem.  Skin: Negative for rash.  Neurological: Negative for dizziness, syncope, speech difficulty, weakness, numbness and headaches.  Hematological: Negative for adenopathy. Does not bruise/bleed easily.  Psychiatric/Behavioral: Negative for behavioral problems and dysphoric mood. The patient is not nervous/anxious.        Objective:   Physical Exam  Constitutional: He is oriented to person, place, and time. He appears well-developed.  HENT:  Head: Normocephalic.  Right Ear: External ear  normal.  Left Ear: External ear normal.  Eyes: Conjunctivae and EOM are normal.  Neck: Normal range of motion.        Status post right carotid endarterectomy  Cardiovascular: Normal rate and normal heart sounds.         Pedal pulses full except for a diminished left posterior tibial pulse  Pulmonary/Chest: Breath sounds normal.  Abdominal: Bowel sounds are normal.  Musculoskeletal: Normal range of motion. He exhibits no edema and no tenderness.  Neurological: He is alert and oriented to person, place, and time.  Psychiatric: He has a normal mood and affect. His behavior is normal.          Assessment & Plan:   cerebrovascular disease stable;   Patient also has evidence of peripheral vascular disease based on clinical exam and a history of mild dyslipidemia. Will start on pravastatin 40 mg daily  Hypertension stable  Rheumatoid arthritis  Gastroesophageal reflux disease   We'll start on pravastatin 40 mg daily and return in 3 months for a lipid panel and SGOT

## 2010-11-23 NOTE — Patient Instructions (Addendum)
Limit your sodium (Salt) intake    It is important that you exercise regularly, at least 20 minutes 3 to 4 times per week.  If you develop chest pain or shortness of breath seek  medical attention.  Return in 3 months for follow-up  

## 2011-01-09 ENCOUNTER — Telehealth: Payer: Self-pay | Admitting: *Deleted

## 2011-01-09 NOTE — Telephone Encounter (Signed)
Stop medication for 2 weeks.  Have patient retry medicine in 2 weeks and notify us if the symptoms return

## 2011-01-09 NOTE — Telephone Encounter (Signed)
Notified pt. 

## 2011-01-09 NOTE — Telephone Encounter (Signed)
Pt just started Pravastatin 3 days ago and had numbness in his mouth and tongue.  What should he do?  [phone] [facility])

## 2011-02-06 NOTE — Assessment & Plan Note (Signed)
OFFICE VISIT   OAKLEN, THIAM  DOB:  April 17, 1941                                       09/09/2008  ZOXWR#:[id number]   The patient continues to follow up for continued surveillance of his  carotid disease.  He had a right carotid endarterectomy for severe  ulcerative plaque and associated strokes in November 2008.  He has a  history of watershed strokes to his right brain.   Duplex scan reveals his right carotid endarterectomy site to be widely  patent.  Mild disease noted in the left internal carotid artery.  Antegrade vertebral flow present bilaterally.   The patient has had no further evidence of strokes.  His function in his  left arm is improved.  No recent visual disturbance.   He appears generally well.  No acute distress, alert and oriented.  BP  149/83, pulse 60 per minute.  His carotids reveal no bruits.  Cranial  nerves intact.  Strength equal bilaterally.   The patient has stable bilateral carotid disease.  Will plan follow-up  again in 1 year with carotid Doppler evaluation.   Balinda Quails, M.D.  Electronically Signed   [facility]MED[facility]  D:  09/09/2008  T:  09/10/2008  Job:  [id number]   cc:   Gordy Savers, MD

## 2011-02-06 NOTE — Consult Note (Signed)
NAME:  Brett Castaneda, Brett Castaneda             ACCOUNT NO.:  [id number]   MEDICAL RECORD NO.:  [id number]          PATIENT TYPE:  INP   LOCATION:  3310                         FACILITY:  [facility]   PHYSICIAN:  Casimiro Needle L. Reynolds, M.D.DATE OF BIRTH:  29-Oct-1940   DATE OF CONSULTATION:  08/17/2007  DATE OF DISCHARGE:                                 CONSULTATION   REQUESTING PHYSICIAN:  Dr.  Cari Caraway   REASON FOR EVALUATION:  Left hemiparesis following right carotid  endarterectomy.   HISTORY OF PRESENT ILLNESS:  This is an inpatient consultation  evaluation of this existing [facility] patient, a 70-  year-old man with a past medical history which includes a right brain  stroke approximately 14 months ago and another right brain TIA about ten  days ago, at which time he was seen by the stroke service, first by Dr.  Nash Shearer and later by Dr. Pearlean Brownie.  At the time of his original stroke, he  was placed on Coumadin for an aortic atheroma, and has been on Plavix  for about a year.  He presented on August 06, 2007 with left upper  extremity tingling and clumsiness which lasted a couple of hours and  resolved.  His MRI at that time showed watershed infarcts in the right  MCA territory.  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  The patient says that since his carotid endarterectomy, he has had some  problems.  The first thing that he has noticed is that his vision has  changed.  He says he is not seeing things off to his left as well as he  did.  The right is somewhat blurry, but the left he is not really seeing  well at all and he is having difficulty reading.  He does feel this is a  new change.  He does have an extensive ophthalmologic history including  blindness in his right eye and multiple retinal detachments in the  past.  He has also been noticed to have a left facial droop, and has noticed  that his left hand is somewhat clumsy.  He says when he brings it to his  face, he is not sure that he is going to touch his face or poke himself.  He thinks his left leg might have felt a little wobbly yesterday, but  this morning he was able to walk around successfully.  He is not aware  of any sensory changes over the left side when he touches the arm or  face.  He denies any associated chest pain, shortness of breath,  palpitations, headache or alteration of consciousness.  He was seen this  morning by Dr. Edilia Bo, who performed a carotid ultrasound which  demonstrated patency of the endarterectomy site.  He also had a CT of  the head this morning, which shows what appears to be a new stroke in  the right frontal area, and neurological consultation was subsequently  requested.   PAST MEDICAL HISTORY:  Remarkable for the previous stroke and right  carotid disease as above.  He has several other medical problems  including:  1. History of rheumatoid arthritis which he is on methotrexate and      Humira.  2. As well as hyperlipidemia and hyperhomocysteinemia.  3. 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  He sees      Dr. Fawn Kirk in the office.   FAMILY/SOCIAL/REVIEW OF SYSTEMS:  Per the admission H&P of August 15, 2007 as well as the consultation note of August 09, 2007 by Dr.  Nash Shearer which is reviewed.   MEDICATIONS AT DISCHARGE FROM THE HOSPITAL ON August 12, 2007:  He was  taking aspirin, Tricor, Folbee, methotrexate and several drops including  timolol, Xalatan and Alphagan.   In the hospital, he remains on the same medication regimen.   PHYSICAL EXAMINATION:  VITAL SIGNS:  Afebrile, blood pressure 154/60,  pulse 70s, respirations 18, O2 sat 97% on room air.  GENERAL EXAMINATION:  This is a  healthy-appearing man, seated in no  evident distress.  HEAD:  Cranium is normocephalic, atraumatic.  Oropharynx is benign.  NECK:  Supple without carotid bruits.  Postoperative changes of the  right carotid endarterectomy are noted.  There are no supraclavicular  bruits.  The surgical site appears to be healing well.  NEUROLOGIC EXAMINATION:  Mental status:  He is awake, alert.  He is  fully oriented to time, place and person.  Recent memory and remote  memory are intact.  Attention span, concentration and fund of knowledge  are all appropriate.  Speech is fluent and not dysarthric.  He has no  defects to confrontational naming.  He can repeat phrases.  Cranial  nerves:  His right eye is somewhat sclerotic with a dense cataract.  Left eye appears healthy.  The left pupil is 3 mm and reactive.  Examination of extraocular movements demonstrates limitation in leftward  gaze with the left eye, with some preserved rightward and vertical  movement.  The right eye is exotropic and does not move well at all.  Examination of visual fields out of the left eye demonstrates decreased  acuity with inability to count fingers in the left upper quadrant, with  some blurriness elsewhere, but able to perceive motion in finger and  count fingers.  Facial sensation is intact to pinprick.  He has an  obvious left facial droop with lower much greater than upper facial  weakness.  Tongue and palate move normally and symmetrically.  Shoulder  shrug strength is normal.  Motor testing:  Normal bulk and tone.  He has  slight weakness of the left finger extensors and interossei, otherwise  normal strength in the left upper and lower extremities compared to the  right.  Sensory:  He reports an intact pinprick and light touch  sensation over all extremities.  Double-simultaneous stimulation is  intact in the upper extremities, question some inconsistent extinction  on the left in the lower extremities.  Coordination:   Rapid movements  are performed adequately.  He is able to perform finger-to-nose without  definite ataxia, although his vision limits his ability to precisely  perform the maneuver somewhat.  Gait:  He rises easily from the chair  and his stance is a bit wide based.  He is able to ambulate a little bit  in the room with minimal assist.  Reflexes are 2+ and symmetric and toe  is up on the left and down on the right.   LABORATORY REVIEW:  CBC from yesterday:  White count 10.0, hemoglobin  10.4, platelets 213,000.  B-Met from yesterday:  Remarkable for an  elevated glucose of 34 and a slightly low sodium of 132, otherwise  normal.   CT of the head performed this morning is personally reviewed.  The study  is remarkable primarily for what appears to be an area of acute ischemia  in the right posterior frontal area.  There do not appear to be any  other acute events.  When compared to the previous MRI done on August 08, 2007, there was some ischemia in this area seen, but this does  appear to be a larger and more well-defined area of ischemia.   IMPRESSION:  Right frontal stroke with resultant mild left hemiparesis  and leftward lateral gaze limitation.  These would be well explained by  his new infarct seen on his imaging study in the left frontal area.  However, there is some concern about some other visual changes including  decreased acuity and possibly neglect.  This does not correspond very  well to what is seen on the CT today.  The new event seen on the scan  today does not correspond well to what was seen on the MRI and;  therefore, probably occurred perioperatively.   RECOMMENDATIONS:  1. Would continue aspirin daily.  2. Ophthalmology evaluation is in progress.  3. I would suggest checking an MRI of the brain to better compare the      more recent event to what happened ten days ago, and to rule out      any involvement in the right posterior areas which might explain       some left-sided      visual neglect, given that his ocular exam is so difficult due to      blindness in the right eye and other limitations.  4. Stroke service to follow.   Thank you for the consultation.      Michael L. Thad Ranger, M.D.  Electronically Signed     MLR/MEDQ  D:  08/17/2007  T:  08/17/2007  Job:  [id number]   cc:   Pramod P. Pearlean Brownie, MD  Gordy Savers, MD

## 2011-02-06 NOTE — Consult Note (Signed)
NAME:  Brett Castaneda, Brett Castaneda NO.:  [id number]   MEDICAL RECORD NO.:  [id number]          PATIENT TYPE:  INP   LOCATION:  3711                         FACILITY:  [facility]   PHYSICIAN:  Balinda Quails, M.D.    DATE OF BIRTH:  1941/09/14   DATE OF CONSULTATION:  08/11/2007  DATE OF DISCHARGE:                                 CONSULTATION   REFERRING PHYSICIAN:  Pramod P. Pearlean Brownie, MD.   PRIMARY CARE PHYSICIAN:  Gordy Savers, MD.   REASON FOR CONSULTATION:  Right hemispheric stroke.   HISTORY:  Brett Castaneda is a 70 year old Caucasian gentleman with a history  of multiple medical problems including a prior right brain embolic  stroke approximately 14-16 months ago.  At that time he was placed on  Coumadin for 3-6 months due to an aortic atheroma.  He was switched  approximately 1 year ago to Plavix.   The patient presented to hospital at [facility] on August 06, 2007,  with a history of a left upper arm tingling, clumsiness, and left-sided  tongue tingling.  These symptoms lasted between 1 and 2 hours and then  completely resolved.  By the time the patient was seen and evaluated by  neurology for consultation, his symptoms were completely gone.   He denied visual disturbance.  No headaches.  No speech changes.  Denies  swallowing or chewing problems.  No dizziness, vertigo, syncope, or  presyncope.  No history of falls.   PAST MEDICAL HISTORY:  1. Right brain stroke.  2. Hyperlipidemia.  3. Retinal detachment.  4. Rheumatoid arthritis.  5. Gastroesophageal reflux.   MEDICATIONS:  1. Tricor 145 mg daily,  2. Multivitamin 1 tablet daily.  3. Lovenox 40 mg subcu daily.  4. Atenolol 0.5% left eye b.i.d.  5. Xalatan ophthalmic 0.005% left eye nightly.  6. Alphagan 0.2% ophthalmic solution left eye b.i.d.  7. Aggrenox 1 tablet p.o. b.i.d.  8. Foltx 1 tablet daily.  9. Tylenol 500 mg p.o. b.i.d.  10.Tylenol 650  mg p.o. q.4 h. p.r.n.   ALLERGIES:  VICODIN and  MORPHINE.   FAMILY HISTORY:  Both of his parents are divorced.  Mother died at age  12, suddenly in her sleep.  Father died at age 41 of an MI.  The patient  has three sisters who are alive with history of coronary artery disease,  previous coronary bypass surgery, history of MI, diabetes and  hypertension.   SOCIAL HISTORY:  The patient is disabled due to rheumatoid arthritis.  He lives in a [place] home.  He lives with his wife.  He has several  grown children.  He does not smoke or use alcohol.   REVIEW OF SYSTEMS:  See history of presenting illness.  The patient  denies cardiac, respiratory, visual, hearing, gastrointestinal,  genitourinary, or respiratory problems.  He does have joint discomfort  and a history of joint replacement associated with rheumatoid arthritis.  No bleeding problems.   PHYSICAL EXAMINATION:  VITAL SIGNS:  BP is 145/84, pulse 69 per minute,  respirations 18 per minute, temperature is 97.7  O2 saturation 96% on  room air.  GENERAL:  The patient is alert and oriented, appeared to be in no acute  distress.  No cyanosis, pallor, or jaundice.  HEENT:  Normocephalic.  Extraocular movements intact.  Mouth and throat  are clear.  NECK:  Supple.  No thyromegaly or adenopathy.  No carotid bruits.  CARDIOVASCULAR:  Regular rate and rhythm.  Normal heart sounds without  murmurs.  No rubs or gallops.  CHEST:  Clear equal air entry bilaterally.  No rales or rhonchi.  ABDOMEN:  Soft, nontender, no masses, or organomegaly.  Normal bowel  sounds.  No bruits.  MUSCULOSKELETAL:  The patient has a left total knee replacement.  He has  good range of motion in his extremities bilaterally.  SKIN:  Warm and dry.  No rashes or ulcers.  NEUROLOGIC:  The patient is alert, and oriented.  Cranial nerves are  intact.  Strength equal bilaterally.  1+ reflexes bilaterally.  Fine  motion in his left hand is mildly reduced.  Strength in his left hand is  good.  He has clear  speech.   INVESTIGATIONS:  1. MR angiogram of the neck reveals a 50%-70% right ICA stenosis.      There is also an MRI of the brain which does verify evidence of      acute watershed infarcts in the right cerebral hemisphere.  2. Carotid Doppler reveals a moderate right internal carotid artery      stenosis with heterogeneous plaque.   FINAL IMPRESSION:  1. Right hemispheric watershed stroke.  2. Hyperlipidemia.  3. History of retinal detachment.  4. Rheumatoid arthritis.  5. Gastroesophageal reflux.   RECOMMENDATIONS:  The patient has what appears to be an unstable right  carotid bifurcation plaque, and now 2 episodes of embolic stroke.  At  this time, best treatment is right carotid endarterectomy for reduction  of stroke risk.  Continue antiplatelet agents.  Scheduled at this time  for Friday, August 15, 2007 at [facility].      Balinda Quails, M.D.  Electronically Signed     PGH/MEDQ  D:  08/11/2007  T:  08/12/2007  Job:  [id number]   cc:   Gordy Savers, MD  Pramod P. Pearlean Brownie, MD

## 2011-02-06 NOTE — Consult Note (Signed)
NAME:  Brett Castaneda, Brett Castaneda NO.:  [id number]   MEDICAL RECORD NO.:  [id number]          Castaneda TYPE:  INP   LOCATION:  3711                         FACILITY:  [facility]   PHYSICIAN:  Bevelyn Buckles. Champey, M.D.DATE OF BIRTH:  17-Jun-1941   DATE OF CONSULTATION:  DATE OF DISCHARGE:                                 CONSULTATION   STROKE CONSULTATION:   REQUESTING PHYSICIAN:  Dr. Felicity Coyer   REASON FOR CONSULTATION:  Stroke.   HISTORY OF PRESENT ILLNESS:  Brett Castaneda is a 70 year old Caucasian male  with multiple medical problems including prior embolic stroke, was found  to have an aortic atheroma, was placed on Coumadin for three to six  months and then switched to Plavix one year ago.  Castaneda presented to  [facility] hospital on Thursday morning after awaking with left upper extremity  tingling, weakness and tongue tingling.  Symptoms lasted about one to  two hours and then completely resolved.  Castaneda denies any other  symptoms of headaches, vision changes, speech changes, swallowing  problems, chewing problems, dizziness, vertigo, falls or loss of  consciousness.   PAST MEDICAL HISTORY:  Positive for:  1. Stroke.  2. Hyperlipidemia.  3. Toxo/retinal detachment4.  Rheumatoid arthritis.  4. Reflux.   MEDICATIONS:  Include:  1. Methotrexate.  2. Plavix.  3. Tricor.  4. Humira.  5. Timolol.   ALLERGIES:  ARE TO VICODIN.   FAMILY HISTORY:  Positive for stroke and vascular disease/aneurysm.   SOCIAL HISTORY:  Castaneda lives with his wife, quit tobacco 25 years ago,  denies any alcohol use.   Review of systems is positive as per HPI.  Review of systems negative as  per HPI in greater than seven other organ systems.   EXAMINATION:  VITALS:  Temperature is 97.6, pulse is 64, respirations  16, blood pressure 142/76, O2 sat is 98%.  HEENT:  Normocephalic, atraumatic.  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  Face looks symmetric.  NECK:  Supple.  HEART:  Regular.  LUNGS:   Clear.  ABDOMEN:  Soft.  EXTREMITIES:  Show good pulses.  NEUROLOGICAL EXAMINATION:  Castaneda is awake, alert.  Language is fluent.  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  Face is symmetric.  Tongue is midline.  On motor examination, Castaneda has good strength in  all four extremities.  Sensation is within normal limits to light touch.  Reflexes are 1 to 2+ symmetric.  Cerebellar function is within normal  limits finger-to-nose.  Gait was not assessed secondary to safety.   LABS:  Cholesterol is 133, LDL is 90.  Hemoglobin A1c is 5.6.  WBC is  6.1, hemoglobin 12.6, hematocrit is 37.0, platelets 259.  Sodium is 135,  potassium is 4.0, chloride is 108, CO2 is 23, BUN 16, creatinine is  1.26, glucose 127.  LFTs are normal.  Calcium is 8.7.   CT head showed hyperdensity in Brett right internal capsule lentiform  nucleus which is old infarct.   MRI/MRA of Brett brain showed acute watershed infarcts in Brett right  hemisphere.  MRA showed no significant stenosis.  IMPRESSION:  A 70 year old Caucasian male with known history of stroke,  who presents with right hemispheric watershed infarcts.  Symptoms have  clearly resolved.  Castaneda is not a candidate for IV/TPA, as his  symptoms are greater than three hours, however Brett symptoms have  resolved.  Will change his Plavix to Aggrenox.  Will consider  anticoagulation after his studies are done, especially with Brett  Castaneda's history.  Will get 2-D echocardiogram to consider a  transesophageal echocardiogram given his history of aortic atheroma.  Will get carotid Dopplers, MRI of Brett neck, homocysteine level, and will  get PT/OT consults as well.  Will follow Brett Castaneda while he is in [facility]  hospital.      Bevelyn Buckles. Nash Shearer, M.D.  Electronically Signed     DRC/MEDQ  D:  08/09/2007  T:  08/09/2007  Job:  [id number]

## 2011-02-06 NOTE — Op Note (Signed)
NAME:  Brett Castaneda, Brett Castaneda NO.:  [id number]   MEDICAL RECORD NO.:  [id number]          PATIENT TYPE:  INP   LOCATION:  3310                         FACILITY:  [facility]   PHYSICIAN:  Balinda Quails, M.D.    DATE OF BIRTH:  05/28/1941   DATE OF PROCEDURE:  08/15/2007  DATE OF DISCHARGE:                               OPERATIVE REPORT   SURGEON:  Denman George, M.D.   ASSISTANT:  Di Kindle. Edilia Bo, M.D., and Jerold Coombe, P.A.   ANESTHETIC:  Is general endotracheal.   ANESTHESIOLOGIST:  Guadalupe Maple, M.D.   PREOPERATIVE DIAGNOSIS:  Right brain stroke.   POSTOPERATIVE DIAGNOSIS:  Right brain stroke.   PROCEDURE:  Right carotid endarterectomy with Dacron patch angioplasty.   CLINICAL NOTE:  Brett Castaneda is a 70 year old Caucasian male with a history  of multiple medical problems including a prior right brain stroke  approximately 14-16 months ago.  He had represented to the hospital  earlier this week with a recurrent right brain stroke by MRI.  This is  an embolic watershed stroke.  He had evidence of moderate ulcerated  right internal carotid artery plaque.  This has worsened over the past  14-16 months.  He was seen in consultation and agreed to undergo right  carotid endarterectomy for reduction of stroke risk.  Risks and benefits  of the operative procedure were explained in detail with a major  morbidity mortality 1-2% to include but not limited MI, CVA, cranial  nerve injury and death.   OPERATIVE PROCEDURE:  The patient was brought to the operating room in  stable condition.  Placed in supine position.  General endotracheal  anesthesia induced.  Foley catheter and arterial line in place.  Right  neck prepped and draped in a sterile fashion.   Curvilinear skin incision was made along the anterior border of the  right sternomastoid muscle.  Dissection carried down through  subcutaneous tissue with electrocautery.  Platysma incised.  Deep  dissection carried along the anterior border of the sternomastoid,  common carotid artery mobilized down to the omohyoid muscle and  encircled with vessel loop.  Vagus nerve was reflected posteriorly and  preserved.  The distal dissection was carried up to the bulb where the  superior thyroid and external carotid were encircled with vessel loops.  The internal carotid artery followed distally up to the posterior belly  of digastric muscle and encircled with a vessel loop.  The hypoglossal  nerve reflected superiorly and preserved.  The patient administered 7000  units of heparin intravenously.   The carotid bifurcation revealed moderate amount of plaque extending 2-3  cm into the right internal carotid artery.  The patient administered  7000 units of heparin intravenously.  Adequate circulation time  permitted.  Carotid vessels were controlled with clamps.  Longitudinal  arteriotomy made in the distal common carotid artery.  The arteriotomy  extended across carotid bulb up and to the internal carotid artery.  There was a friable plaque with some thrombus present in the bulb.  Approximately 60% stenosis.  A shunt inserted.  The plaques were removed with an elevator.  The endarterectomy carried  down to the common carotid artery, and plaque was divided transversely  with Potts scissors.  Plaque raised up in the bulb, and superior thyroid  and external carotid were endarterectomized using an eversion technique.  The distal internal carotid artery plaque feathered out well.  Fragments  of plaque removed with fine forceps.  Site irrigated with heparin saline  solution.   A patch angioplasty endarterectomy site carried out with a running 6-0  Prolene suture and a Finesse Dacron patch.  At completion of patch  angioplasty, shunt was removed.  All vessels well flushed.  Clamps  removed directing initial antegrade flow up the external carotid artery;  following this, the internal carotid was  released.   Adequate hemostasis obtained.  Sponges and instrument counts correct.  The patient administered 50 mg of protamine intravenously.   Sternomastoid fascia closed running with a 2-0 Vicryl suture.  Platysma  closed with a running 3-0 Vicryl suture.  Skin closed with 4-0 Monocryl.  Dermabond applied.   Anesthesia reversed in the operating room.  The patient awakened  readily.  Moved all extremities to command.  Transferred to recovery  room in stable condition.      Balinda Quails, M.D.  Electronically Signed     PGH/MEDQ  D:  08/15/2007  T:  08/16/2007  Job:  [id number]

## 2011-02-06 NOTE — Discharge Summary (Addendum)
NAME:  Brett Castaneda, Brett Castaneda NO.:  [id number]   MEDICAL RECORD NO.:  [id number]          PATIENT TYPE:  INP   LOCATION:  3310                         FACILITY:  [facility]   PHYSICIAN:  Balinda Quails, M.D.    DATE OF BIRTH:  Nov 06, 1940   DATE OF ADMISSION:  08/15/2007  DATE OF DISCHARGE:                               DISCHARGE SUMMARY   ADDENDUM.    ____ QA MARKER: 29                                                 This is an addendum to a previously dictated discharge summary.  See job  number [id number] for details outlining Brett Castaneda admission and discharge  diagnoses, procedures, history and hospital course to the morning of  August 16, 2007.   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   DIAGNOSTICS FROM August 17, 2007:  1. Carotid duplex preliminary report showing right CEA appears patent.      All velocities within normal limits.  There was no left internal      carotid artery stenosis.  The right vertebral flow was retrograde.      Left vertebral antegrade.  2. The CT scan of the head without contrast showing new right frontal      cortical stroke, about 2 x 3 cm in size.  3. MRA/MRI of the head and brain without contrast showing there was no      evidence of large or medium vessel occlusion or correctable      proximal stenosis, bolus enlargement of the left MCA bifurcation      region that could represent 3-4 mm aneurysm, multiple embolic type      strokes to the right MCA with the largest measuring 2 x 3 cm in the      right posterior frontal region, other centimeter subcentimeter      stroke throughout the right hemisphere included 4 x 5 mm CVA of the      basilar ganglia/posterior limb of the internal capsule.  There is      no evidence of stroke in the left hemisphere brain stem or      cerebellum of an  acute nature.   HISTORY:  From Brett Castaneda previous discharge summary, it was thought  that he was neurologically intact postoperatively.  However, on the  morning of August 17, 2007 he reported that he had actually felt that  he had left upper extremity clumsiness since surgery  and also felt that  he had deficit in his left visual field and a decrease in visual acuity  overall.  Of note, he is blind in his right eye.  Physical exam showed  that showed that he did have left-sided facial droop and limited  peripheral vision in the left eye.  His lower  extremity strength was  strong and symmetrical.  His upper extremity movement did show a slight  decrease in strength on the left.  This prompted stat carotid duplex and  CT scan of the head without contrast with results as discussed above.  With the finding of a new stroke ,we did ask for neurology to be  reconsulted and he was seen by Dr. Kelli Hope.  We also asked for  an  ophthalmology consult as it was not felt that his new stroke well  explained his other visual changes including decreased acuity and  possible neglect.  Neurology did recommended a MRI/MRA of the head with  results as discussed above.  The ophthalmologist recommended that the  patient undergo a formal visual field study on an outpatient basis and  continue follow-up with Dr. Luciana Axe for his retina.   On August 18, 2007 Brett Castaneda was showing improved strength in the left  upper extremity.  In fact, they felt essentially symmetrical.  However,  he still reports some clumsiness of his left upper arm.  He reported  that he was ambulating without difficulty.  His vision was reportedly  the same.  Of note, the ophthalmologist reported that he was able to see  20/30 if objects were held to his right side.  He was not having any  seizure or swallowing difficulties.  He was reevaluated by neurology and  felt that no further stroke workup was indicated; however, that  he  should be evaluated by occupational therapy and perhaps undergo  outpatient therapy.  He is also to see Dr. Luciana Axe on outpatient basis as  well.   At this point it is felt Brett Castaneda is stable from his new stroke.  He is  to be reevaluated by Dr. Madilyn Fireman this morning and anticipate that he  should be ready for discharge home on the afternoon of August 18, 2007  or the morning of August 19, 2007.   DISCHARGE MEDICATIONS:  Please see previously dictated discharge  summary.   DISCHARGE INSTRUCTIONS:  Please see previously dictated discharge  summary.  In addition he was instructed to avoid driving until cleared  by Dr. Luciana Axe.  He is also to schedule a 104-month followup with Dr. Pearlean Brownie  or Dr. Nash Shearer at [facility].  He should call to  make that appointment and is to call and schedule followup with Dr.  Luciana Axe for a formal visual field testing and to continue evaluation of  his retina.  We have also ordered a home health occupational therapy  evaluation.      Jerold Coombe, P.A.      Balinda Quails, M.D.  Electronically Signed    AWZ/MEDQ  D:  08/18/2007  T:  08/18/2007  Job:  [id number]   cc:   Pramod P. Pearlean Brownie, MD  Raenette Rover Felicity Coyer, MD  Gordy Savers, MD  Alford Highland Rankin, M.D.

## 2011-02-06 NOTE — Assessment & Plan Note (Signed)
OFFICE VISIT   Brett Castaneda, Brett Castaneda  DOB:  07/24/1941                                       07/15/2009  XBJYN#:[id number]   The patient presents today for evaluation of his right neck.  He is very  pleasant 70 year old gentleman who is status post right carotid  endarterectomy Dacron patch angioplasty by Dr. Liliane Bade on August 15, 2007.  He had prior right brain stroke.  He had been seen in our  office for routine follow-up.  Recently he reports concern regarding  discomfort in his right neck.  He reports that he has had a sore throat  on and off for the last three months and reported sensation of a  prominent pulsation over his left carotid.  He does report some mild  discomfort with this, but more sore throat.  He reports that the  swelling that is associated with this does come and go and does not  appear to be related to the pain.  He reports that the pain is  relatively mild and is not related to any specific events.  He does  report that he had a fever blister on his lip but does not note any  association with this.  He continues to be a nonsmoker having quit in  1982.  Does not drink alcohol on a regular basis.  Review of systems  unchanged.  He continues to have difficulty with reflux and arthritic  joint pain.  He is blind in his right eye.   PHYSICAL EXAM:  A well-developed, well-nourished white male appearing  stated age.  He is a well-developed, well-nourished in no acute  distress.  He does have a blind in right eye which does not focus. Neck:  Reveals no lymphadenopathy.  He does have a prominent carotid pulses  under his old carotid incision.  He does not have bruits bilaterally.  He has no clubbing, cyanosis or edema.  He has no skin rashes.  He is  grossly intact neurologically.   I discussed the significance of his new issue regarding fullness and  soreness in his right neck.  I explained that we have reviewed his  duplex with him today  which he did take in our office.  This reveals no  evidence of carotid artery stenosis and does not show any evidence of  false aneurysm.  I explained that I feel his fullness and discomfort is  not related to his carotid surgery since he does not have any evidence  of false aneurysm or other issue.  I explained that if he does persist  to have a sore throat that I would suggest that he discuss this further  with Dr. Amador Cunas to determine if further workup is indicated.  He  understands this and will see [place] again in our vascular laboratory for a  yearly carotid protocol follow-up.  He will also notify us should he  develop any neurologic deficits.   Larina Earthly, M.D.  Electronically Signed   TFE/MEDQ  D:  07/15/2009  T:  07/18/2009  Job:  [id number]   cc:   Gordy Savers, MD

## 2011-02-06 NOTE — Assessment & Plan Note (Signed)
OFFICE VISIT   BURRIS, MATHERNE  DOB:  12-Apr-1941                                       08/28/2007  NWGNF#:[id number]   The patient returned to the office today after undergoing a right  carotid endarterectomy for severe right carotid stenosis associated with  multiple right hemispheric watershed strokes.  Following surgery, he did  have evidence of several embolic strokes in his right brain, some of  these may have occurred during surgery due to the fact he had a very  unstable plaque.  When the artery was opened, there was actually free  atherosclerotic debris and clot in his right internal carotid artery.   Fortunately, he has made a significant functional recovery.  He is  living at home with his wife.  Still some visual problems in his left  eye.   He is alert and oriented.  Right neck incision healing unremarkably.  His blood pressure is 159/88.  Pulse 63 per minute, regular.  Respirations 18 per minute.  O2 sat 100%.  No carotid bruits audible.   The patient is doing reasonably well.  I will plan followup with him  again in 6 months with a carotid Doppler evaluation per routine.   Balinda Quails, M.D.  Electronically Signed   PGH/MEDQ  D:  08/28/2007  T:  08/29/2007  Job:  [id number]   cc:   Gordy Savers, MD

## 2011-02-06 NOTE — Procedures (Signed)
CAROTID DUPLEX EXAM   INDICATION:  Followup of known carotid artery disease.  The patient is  currently asymptomatic.   HISTORY:  Diabetes:  No.  Cardiac:  No.  Hypertension:  No.  Smoking:  No.  Previous Surgery:  Right CEA with DPA on 08/15/2007 by Dr. Madilyn Fireman.  CV History:  Multiple right hemispheric watershed strokes.  Amaurosis Fugax No, Paresthesias No, Hemiparesis No                                       RIGHT             LEFT  Brachial systolic pressure:         134               134  Brachial Doppler waveforms:         WNL               WNL  Vertebral direction of flow:        Antegrade (atypical)                Antegrade  DUPLEX VELOCITIES (cm/sec)  CCA peak systolic                   65                63  ECA peak systolic                   111               89  ICA peak systolic                   72                67  ICA end diastolic                   20                21  PLAQUE MORPHOLOGY:                  N/A               Soft  PLAQUE AMOUNT:                      N/A               Mild  PLAQUE LOCATION:                    N/A               Bifurcation, ICA   IMPRESSION:  1. Right ICA without recurrent stenosis status post CEA.  2. Left 1-39% ICA stenosis.  3. Patent ECAs.  4. Bilateral antegrade flow in vertebral arteries, however, right      vertebral artery demonstrates early systolic deceleration      consistent with subclavian stenosis.   ___________________________________________  P. Liliane Bade, M.D.   PB/MEDQ  D:  03/11/2008  T:  03/11/2008  Job:  [id number]

## 2011-02-06 NOTE — Procedures (Signed)
CAROTID DUPLEX EXAM   INDICATION:  Followup of known carotid artery disease.   HISTORY:  Diabetes:  No.  Cardiac:  No.  Hypertension:  No.  Smoking:  No.  Previous Surgery:  Right CEA with DPA on 08/15/2007 by Dr. Madilyn Fireman.  CV History:  Multiple right hemispheric watershed stroke.  Amaurosis Fugax No, Paresthesias No, Hemiparesis No                                       RIGHT             LEFT  Brachial systolic pressure:         124               128  Brachial Doppler waveforms:         Within normal limits                Within normal limits  Vertebral direction of flow:        Antegrade         Antegrade  DUPLEX VELOCITIES (cm/sec)  CCA peak systolic                   90                103  ECA peak systolic                   128               125  ICA peak systolic                   109               97  ICA end diastolic                   42                32  PLAQUE MORPHOLOGY:                                    Heterogenous  PLAQUE AMOUNT:                                        Mild  PLAQUE LOCATION:                                      Bifurcation and  ICA   IMPRESSION:  1. Patent right internal carotid artery with no restenose.  2. 1-39% stenosis of the left internal carotid artery.     ___________________________________________  P. Liliane Bade, M.D.   AC/MEDQ  D:  09/09/2008  T:  09/09/2008  Job:  [id number]

## 2011-02-06 NOTE — Discharge Summary (Signed)
NAME:  Brett Castaneda, Brett Castaneda NO.:  [id number]   MEDICAL RECORD NO.:  [id number]          PATIENT TYPE:  INP   LOCATION:  3310                         FACILITY:  [facility]   PHYSICIAN:  Balinda Quails, M.D.    DATE OF BIRTH:  [birth year]   DATE OF ADMISSION:  08/15/2007  DATE OF DISCHARGE:  08/17/2007                               DISCHARGE SUMMARY   ADMISSION DIAGNOSIS:  Severe right internal carotid artery stenosis,  symptomatic with recent history of right hemispheric watershed strokes.   DISCHARGE/SECONDARY DIAGNOSES:  1. Severe right internal carotid artery stenosis, symptomatic with      recent history of right hemispheric watershed strokes,  status post right carotid endarterectomy.  1. Postoperative nausea and vomiting.  2. Preoperative elevated liver enzymes, AST and ALT.  3. Recent admission for right watershed cerebrovascular accident and      was noted to have mild anemia hyperglycemia (hemoglobin A1c 5.6).  4. Hyperlipidemia.  5. History of retinal detachment.  6. Rheumatoid arthritis.  7. Gastroesophageal reflux disease.  8. Intolerance to Vicodin which causes nausea, vomiting and morphine      which causes hallucinations.  He also had a rash and itching with a      dopamine this admission and it was felt to be a new drug allergy.   PROCEDURES:  August 15, 2007, right carotid endarterectomy with Dacron  patch angioplasty by P. Liliane Bade, M.D.   BRIEF HISTORY:  Brett Castaneda is a 70 year old Caucasian male with history of  multiple medical problems including a prior right brain embolic stroke  approximately 14-16 months ago.  At that time, he was placed on Coumadin  for 3-6 months.  Due to aortic atheroma.  He was switched to Plavix  approximate one year ago.  On August 06, 2007, however, the patient  presented to [facility] with complaints of left  upper arm tingling, clumsiness and left-sided tongue tingling.  The  symptoms lasted  between one and two hours and then completely resolved.  By the time he was evaluated by neurology, symptoms had completely  resolved.  MRA of the neck revealed 50-70% right internal carotid or  stenosis.  An MRI of the brain verified evidence of acute watershed  infarcts in the right cerebral hemisphere.  Carotid Dopplers revealed  moderate right internal carotid artery stenosis with heterogeneous  plaque.  Vascular surgery consultation was requested and he was seen by  Dr. Denman George, who recommended that he undergo a right carotid  endarterectomy.  He was discharged home following his new symptoms on  August 12, 2007, with plans for readmission for his carotid  endarterectomy on August 15, 2007.   HOSPITAL COURSE:  Brett Castaneda was electively admitted to [facility] August 15, 2007, and underwent the previously  mentioned procedure.  Postoperatively, he was extubated and was  neurologically at his baseline.  Of note, he did have hypotension and  bradycardia postoperatively and was started on dopamine, however, within  30 minutes of starting the dopamine, the patient  complained of itching  and rash on his chest.  He also had associated heart palpitations.  Dopamine was stopped and within approximately half an hour or so his  palpitations had resolved.  Benadryl was ordered as needed for his rash.  Symptoms had resolved by postoperative day #1.  His hypotension resolved  on its own.  He continued to have some mild bradycardia but by morning  rounds, her heart rate was in the 60s and in sinus rhythm.  He was  afebrile, saturating 95% on room air.  Arterial line and Foley catheter  had been discontinued.  There was an order for his diet to be advanced  as tolerated, however, he had had emesis overnight.  This has been  treated symptomatically and felt most likely secondary to anesthesia,  however, we will continue to monitor this and continue further workup  if  it persists.  Of note, his AST and ALT were elevated preoperatively at  89 and 123, respectively.  There are no baseline liver function test to  compare this with.  Due to Brett Castaneda nausea, it was felt that he should  remain hospitalized for an additional day.  If, on postop day #2, he is  ambulating without difficulty and tolerating a regular diet and voiding  without problems, we anticipate he will be discharged home at that time.  Currently, he is in stable condition, his incision shows no evidence of  hematoma.  Again, neurologically he remains intact.   His postoperative labs show a sodium 132, potassium 3.8, chloride 104,  CO2 25, blood glucose 134, BUN 14, creatinine 1.19, calcium 8.2.  White  count 10, hemoglobin 10.4, hematocrit 31, platelet count 214.  His  preoperative liver function tests showed total bilirubin of 0.8,  alkaline phosphatase 38, AST of 89, ALT 123, total protein 7.1, blood  albumin 3.7.   DISCHARGE MEDICATIONS:  1. Aspirin 325 mg p.o. daily.  2. Tricor 145 mg p.o. nightly.  3. Xalatan 0.005% one drop both eyes nightly.  4. Alphagan 0.2% one drop both eyes b.i.d.  5. Foltx one p.o. daily.  6. Methotrexate 2.5 mg 5 tablets every Thursday.  7. Humira every other week.  8. ICAPS one daily.  9. Vitamin B1 and B6 supplements daily.  10.Timoptic 0.5% one drop both eyes twice daily.  11.Oxycodone 5 mg 1-2 tablets p.o. q.4h. p.r.n. pain.   DISCHARGE INSTRUCTIONS:  He is to continue heart-healthy diet.  May  shower and clean incisions gently with soap and water.  Call if he  develops fever greater than 101, redness or drainage from incision sites  are or neurologic symptoms such as severe headache, weakness, speech or  visual changes.  He should avoid driving or lifting for the next two  weeks.  Increase activity as tolerated.  See Dr[facility] Madilyn Fireman at [facility] in  approximately two weeks.  Our office will contact him regarding  specific  appointment time.  He was also notified to contact family physician  regarding scheduling follow-up labs to check his liver function tests as  medication adjustment may need to be considered.      Jerold Coombe, P.A.      Balinda Quails, M.D.  Electronically Signed    AWZ/MEDQ  D:  08/16/2007  T:  08/17/2007  Job:  [id number]   cc:   Vikki Ports A. Felicity Coyer, MD  Pramod P. Pearlean Brownie, MD  Gordy Savers, MD

## 2011-02-06 NOTE — Discharge Summary (Signed)
NAME:  Brett Castaneda, Brett Castaneda NO.:  [id number]   MEDICAL RECORD NO.:  [id number]          PATIENT TYPE:  INP   LOCATION:  3711                         FACILITY:  [facility]   PHYSICIAN:  Valerie A. Felicity Coyer, MDDATE OF BIRTH:  1941-02-20   DATE OF ADMISSION:  08/08/2007  DATE OF DISCHARGE:  08/12/2007                               DISCHARGE SUMMARY   DISCHARGE DIAGNOSES:  1. Right watershed cerebrovascular accident in setting of right      internal carotid artery stenosis 60-80%.  2. Mild anemia.  3. Mild hyperglycemia.   HISTORY OF PRESENT ILLNESS:  Brett Castaneda is a 70 year old Caucasian male,  with multiple medical problems, including prior embolic stroke.  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  Was placed on Coumadin for 3-6 months  and then switched to Plavix 1 year ago.  Patient presented to the  emergency room on Thursday morning, after waking with left upper  extremity tingling, weakness and tongue tingling.  Symptoms lasted about  1-2 hours and then completely resolved.  Patient denies any other  symptoms of headache, vision changes, speech changes, swallowing  problems, chewing problems, dizziness, vertigo, falls, or loss of  consciousness.  Patient was admitted for further evaluation.   PAST MEDICAL HISTORY:  1. Stroke.  2. Hyperlipidemia.  3. Toxo-retinal detachment.  4. Rheumatoid arthritis.  5. Reflux.   COURSE IN HOSPITALIZATION:  Problem 1.  Right-sided watershed CVA.  Patient underwent MRI, MRA of the head upon admission to the hospital,  which revealed acute watershed infarct distributed throughout the right  cerebral hemisphere.  MRI, MRA of the neck revealed 50-75% stenosis of  the proximal right ICA, unchanged since 2006.  Patient also underwent 2-  D echo, which revealed normal LV EF of 60% and no obvious cardiac source  of embolus was found.  Patient was evaluated by both neuro and vascular  surgery as inpatient.  He is scheduled to have CEA on  August 15, 2007,  per Dr. Madilyn Fireman.  Patient to be discharged on aspirin and periostin.  Aggrenox to be discontinued.  Problem 2.  Mild anemia.  Likely, this is secondary to methotrexate use.  Monitor outpatient.  Problem 3.  Mild hyperglycemia.  A1C obtained during hospitalization was  5.6.  Monitor outpatient.   MEDICATIONS AT TIME OF DISCHARGE:  1. Aspirin 325 mg 1 tab p.o. daily.  2. Tricor 145 mg p.o. q.h.s.  3. Timolol 0.5% 1 drop each eye b.i.d.  4. Xalatan 0.005% 1 drop each eye q.h.s.  5. Alphagan 0.2% 1 drop each eye b.i.d.  6. Foltabs 1 tab daily.  7. Methotrexate 2.5 mg 5 tabs every Thursday.   Patient to hold Humira until 1 week postop.   LABS AT TIME OF DISCHARGE:  Homocystine 16.9.  Cholesterol 133, HDL 30,  LDL 90.  Hemoglobin A1C 5.6.  Hemoglobin 12.6, hematocrit 37.0, white  cell count 6.1.      Valerie A. Felicity Coyer, MD  Electronically Signed     VAL/MEDQ  D:  08/12/2007  T:  08/12/2007  Job:  [id number]   cc:  Marletta Lor, MD

## 2011-02-06 NOTE — Procedures (Signed)
CAROTID DUPLEX EXAM   INDICATION:  Right carotid endarterectomy.   HISTORY:  Diabetes:  No.  Cardiac:  No.  Hypertension:  No.  Smoking:  No.  Previous Surgery:  Right carotid endarterectomy on 08/15/2007.  CV History:  History of multiple right hemispheric strokes.  Amaurosis Fugax No, Paresthesias No, Hemiparesis No                                       RIGHT             LEFT  Brachial systolic pressure:         134               136  Brachial Doppler waveforms:         Normal            Normal  Vertebral direction of flow:        Antegrade / atypical                Antegrade  DUPLEX VELOCITIES (cm/sec)  CCA peak systolic                   64                115  ECA peak systolic                   90                99  ICA peak systolic                   76                78  ICA end diastolic                   24                27  PLAQUE MORPHOLOGY:                                    Heterogeneous  PLAQUE AMOUNT:                      None              Mild  PLAQUE LOCATION:                                      ICA   IMPRESSION:  1. Patent right carotid endarterectomy site with no right internal      carotid artery stenosis.  2. 1%-39% stenosis of the left internal carotid artery.  3. Dampened flow in the right vertebral artery demonstrates early      systolic deceleration.  4. No significant change in the bilateral internal carotid arteries      noted when compared to the previous exam on 09/09/2008.   ___________________________________________  Larina Earthly, M.D.   CH/MEDQ  D:  07/15/2009  T:  07/16/2009  Job:  [id number]

## 2011-02-06 NOTE — Procedures (Signed)
CAROTID DUPLEX EXAM   INDICATION:  Followup carotid artery disease.   HISTORY:  Diabetes:  No.  Cardiac:  No.  Hypertension:  No.  Smoking:  No.  Previous Surgery:  Right carotid endarterectomy 08/15/2007.  CV History:  History of multiple right hemisphere strokes.  Amaurosis Fugax Yes No, Paresthesias Yes No, Hemiparesis Yes No                                       RIGHT             LEFT  Brachial systolic pressure:         130               133  Brachial Doppler waveforms:         Within normal limits                Within normal limits  Vertebral direction of flow:        Antegrade         Antegrade  DUPLEX VELOCITIES (cm/sec)  CCA peak systolic                   87                104  ECA peak systolic                   129               153  ICA peak systolic                   108               100  ICA end diastolic                   40                38  PLAQUE MORPHOLOGY:                  Intimal wall thickening             Heterogeneous  PLAQUE AMOUNT:                      Minimal           Minimal  PLAQUE LOCATION:                    ICA, CCA          Bifurcation   IMPRESSION:  1. Right internal carotid artery velocity suggests a 1%-39% stenosis.  2. Patent right carotid endarterectomy site with no evidence of      restenosis.  3. Left internal carotid artery velocity suggests 1%-39% stenosis.   ___________________________________________  Larina Earthly, M.D.   EM/MEDQ  D:  07/31/2010  T:  07/31/2010  Job:  [id number]

## 2011-02-06 NOTE — Assessment & Plan Note (Signed)
OFFICE VISIT   Brett Castaneda, Brett Castaneda  DOB:  06/08/1941                                       03/11/2008  EAVWU#:[id number]   The patient returns today for continued surveillance of his carotid  disease.  He underwent a right carotid endarterectomy for severe  ulcerative plaque and stenosis in November of last year.  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   His duplex scan at this time reveals his right carotid endarterectomy  site to be widely patent without recurrent stenosis.  Minimal stenosis  in the left internal carotid artery.  Mild soft plaque noted in the left  carotid bifurcation.  Antegrade vertebral flow bilaterally with some  disturbed flow noted in the right vertebral.   PHYSICAL EXAMINATION:  The patient appears generally well.  BP 124/79,  pulse is 60 per minute.  No carotid bruits.  Cranial nerves intact.  Strength equal bilaterally.   The patient has recovered well from his carotid surgery and his history  of watershed strokes in the right brain.  I will plan to follow up with  him again in 6 months with a carotid Doppler evaluation.   Balinda Quails, M.D.  Electronically Signed   PGH/MEDQ  D:  03/11/2008  T:  03/12/2008  Job:  [id number]   cc:   Gordy Savers, MD

## 2011-02-09 NOTE — Discharge Summary (Signed)
NAME:  Brett Castaneda, Brett Castaneda                            ACCOUNT NO.:  [id number]   MEDICAL RECORD NO.:  [id number]                   PATIENT TYPE:  INP   LOCATION:  5037                                 FACILITY:  [facility]   PHYSICIAN:  Dyke Brackett, M.D.                 DATE OF BIRTH:  Nov 27, 1940   DATE OF ADMISSION:  06/16/2003  DATE OF DISCHARGE:  06/21/2003                                 DISCHARGE SUMMARY   ADMISSION DIAGNOSES:  1. Rheumatoid arthritis.  2. End-stage rheumatoid/osteoarthritis right knee.  3. Blind right eye.  4. Left eye retinal detachment, being treated recently.  5. 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   DISCHARGE DIAGNOSES:  1. End-stage rheumatoid/osteoarthritis right knee, status post right total     knee arthroplasty.  2. Acute blood loss anemia secondary to surgery.  3. Transient hallucinations secondary to pain medications.  4. Constipation.  5. Rheumatoid arthritis.  6. Blind right eye.  7. Left eye retinal detachment, being treated by Dr. Luciana Axe.  8. History of methicillin-sensitive Staphylococcus infection right knee in     1999.   SURGICAL PROCEDURE:  On June 16, 2003, Brett Castaneda underwent a right total  knee arthroplasty by Dr. Dyke Brackett, assisted by Richardean Canal, P.A.C.  He had a DePuy MBP Kiel tibial tray cemented size 5 with an LCS complete RP  insert size large, 12.5 mm thickness.  An LCS complete primary femoral  cemented component size large right, and an LCS three-peg rotating patella  cemented size large.   COMPLICATIONS:  None.   CONSULTS:  1. Pharmacy consult for Coumadin therapy June 16, 2003.  2. Physical therapy consult June 17, 2003.  3. Occupational therapy consult June 18, 2003.  4. Case management consult June 20, 2003.   HISTORY OF PRESENT ILLNESS:  This 70 year old white male patient presented  to Dr. Madelon Lips with a history of a workmen's  compensation injury to his  right knee and a subsequent infection in 1999.  He had Methicillin-sensitive  Staphylococcus infection in that knee which caused damage.  He has had  chronic pain in that knee since that time.  The pain is present with  weightbearing and also just at rest.  It is a deep, dull aching sensation  with some popping and grinding in the knee.  He has failed conservative  treatment and, because of that, he is presenting for a right knee  replacement.   HOSPITAL COURSE:  Brett Castaneda tolerated his surgical procedure well and without  immediate postoperative complications.  He was subsequently transferred to  5000.  He had great difficulty with pain control that first 24 hours.  PCA  morphine was not helping.  He was switched to PCA Dilaudid.  His t-max on  postoperative day #1 was 100.5, vital signs were stable.  The leg  was  neurovascularly intact.   On postoperative day #2 pain was much better controlled with the Dilaudid.  He did have some nausea and calf pain.  A Doppler was obtained to rule out a  DVT, and that was negative.  Right knee incision was well approximated, and  the leg was neurovascularly intact.  He was subsequently transfused with two  units of autologous blood due to a hemoglobin of 8.7 and hematocrit of 25.7.  He was switched to p.o. pain medications and continued on therapy.   He continued to make slow progress over the next several days.  On the night  of June 18, 2003, he had some difficulty seeing and complained of  everything looking like gray snow.  Dr. Leta Baptist was consulted for Dr.  Luciana Axe, and they felt this was a normal sensation from his retinal  detachment and for him to just follow up with Dr. Luciana Axe after discharge.   He made good progress with therapy over the next several days.  He did have  difficulty with constipation.  That was treated with laxatives.  He had some  hallucinations while he has been on the Dilaudid.  Once that  was  discontinued, that slowly resolved.  He did continue to run a low-grade  temperature of 100.7 to 101.2 and 101.3.  Wound and otherwise fever workup  was negative.  At this time he is ready for discharge home.   DISCHARGE INSTRUCTIONS:   DIET:  He can resume his regular prehospitalization diet.   MEDICATIONS:  1. He may resume his prehospitalization medications except for the baby     aspirin, Oruvail, and Arava.  These include:     A. Humira 40 mg injection once every other week on Fridays.     B. Timoprazole 20 mg p.o. q.h.s.     C. Percocet p.r.n. for pain.  2. Additional medications include:     A. Coumadin:  He is to take as directed by pharmacy.  He has been taking        5 mg at 6 p.m.     B. OxyContin 10 mg:  He is to take 2 tablets every 12 hours for seven        days and then 1 tablet every 12 hours for seven days and then        discontinue.  He is not to crush or chew those tablets.  He was given        42 with no refill.     C. Percocet 5/325 mg, 1-2 p.o. q.4h. p.r.n. for breakthrough pain, 30        with no refill.   ACTIVITY:  He can be out of bed, weightbearing as tolerated on the right leg  with use of the walker.  Please see the blue total-knee discharge sheet for  further activity instructions.   WOUND CARE:  He may shower after there is no drainage from the wound for two  days.  Please see the blue total-knee discharge sheet for further  instructions.   FOLLOW-UP:  1. He needs to follow up with Dr. Luciana Axe for his eyes after discharge.  2. He is to follow up with Dr. Madelon Lips in our office on Thursday, July 01, 2003, and he needs to call [phone] for that appointment.   LABORATORY DATA:  On June 22, 2003, white count 11, hemoglobin 12.4,  hematocrit 37.2.  On June 17, 2003, white count 11.9, hemoglobin 9.5,  hematocrit 28.5.  On June 18, 2003, white count 11.8, hemoglobin 8.7, hematocrit 25.7.  On June 19, 2003, hemoglobin 9.9,  hematocrit 28.1.  On June 20, 2003, white count 10.1, hemoglobin 10.6, hematocrit 31.4,  and platelets 299.   On June 22, 2003, PT 14, INR 1.1.  On June 21, 2003, PT 18.8, INR  1.9.   Glucose ranged throughout the hospitalization from a low of 113 on September  20 to a high of 140 on June 18, 2003.  Calcium ranged from a low of 7.9  on September 23 to a high of 8.1 on June 18, 2003.  All other  laboratory studies were within normal limits.  Wound and tissue cultures  taken from the wound on June 16, 2003, showed no growth after two days.      Legrand Pitts Marshall Cork, M.D.    KED/MEDQ  D:  06/21/2003  T:  06/21/2003  Job:  [id number]   cc:   Aundra Dubin, M.D.

## 2011-02-09 NOTE — Consult Note (Signed)
. [facility]  Patient:    Brett Castaneda, Brett Castaneda                         MRN: [id number] Proc. Date: 06/28/00 Adm. Date:  [id number] Attending:  Caleen Essex                          Consultation Report  HISTORY OF PRESENT ILLNESS:  Brett Castaneda is a 70 year old white male who began having epigastric abdominal pain on Sunday evening and Monday morning of this week.  He had never had any pain like this before.  The pain was fairly constant in nature.  He went to the emergency department and underwent a CT scan of his abdomen, which by report revealed gallstones but no other evidence of disease.  He was referred to Gita Kudo, M.D., who had him set up for gallbladder surgery for this problem.  He was doing well at home and this evening drank some chicken soup and had recurrence of his abdominal pain. Tonight he describes his abdominal pain as all across his upper abdomen and in his back.  He has not really experienced much nausea or vomiting but just constant, unbearable pain.  He denies any fevers, chills, chest pain, shortness of breath, diarrhea, dysuria, constipation.  His other review of systems is pretty much unremarkable.  PAST MEDICAL HISTORY:  Significant for gallstones, rheumatoid arthritis, previous septic right knee, retinal detachment and blindness in the right eye, early retinal detachment in the left eye.  PAST SURGICAL HISTORY:  Significant only for surgery on his right knee and fingers.  MEDICATIONS:  Oruvail, sulfasalazine, vitamin E, and Arava.  ALLERGIES:  VICODIN.  SOCIAL HISTORY:  He denies any use of alcohol or tobacco products.  FAMILY HISTORY:  Fairly unremarkable.  PHYSICAL EXAMINATION:  GENERAL:  He is a well-developed, well-nourished white male in no acute distress, lying in bed.  SKIN:  Warm and dry.  HEENT:  Extraocular muscles are intact.  NECK:  No bruits.  He does have a small palpable right thyroid nodule that  the patient states has been there for 20 years and has not changed.  LUNGS:  Clear to auscultation laterally.  HEART:  Regular rate and rhythm.  ABDOMEN:  Soft.  He is mildly tender to palpation in his epigastrium and both upper quadrants.  He shows no guarding or peritoneal signs.  EXTREMITIES:  He has no cyanosis, clubbing, or edema, with good peripheral pulses.  NEUROLOGIC:  He is alert and oriented x 4.  HEMATOLOGIC:  I could palpate no lymphadenopathy.  ASSESSMENT AND PLAN:  This is a 70 year old white male with new onset of epigastric and upper abdominal pain.  He was previously diagnosed with gallstones by CT scan and has had no further workup.  We will admit the patient for IV fluids and rehydration.  Will obtain a right upper quadrant ultrasound to further delineate his biliary system and any associated stones within it.  We will also obtain some lab work to better evaluate his hepatic function and pancreatic function. DD:  06/28/00 TD:  06/28/00 Job: [id number]

## 2011-02-09 NOTE — Op Note (Signed)
[place]  Patient:    Brett Castaneda, Brett Castaneda                         MRN: [id number] Proc. Date: 06/28/00 Adm. Date:  [id number] Disc. Date: [id number] Attending:  Chevis Pretty S                           Operative Report  PREOPERATIVE DIAGNOSIS:  Acute calculus cholecystitis.  POSTOPERATIVE DIAGNOSIS:  Acute calculus cholecystitis.  PROCEDURE PERFORMED:  Laparoscopic cholecystectomy.  SURGEON:  Adolph Pollack, M.D.  ASSISTANT:  Velora Heckler, M.D.  ANESTHESIA:  General.  INDICATIONS:  This 70 year old male began having epigastric right upper quadrant pain on 06/24/00 and presented to the emergency department.  He had a CT scan which demonstrates gallbladder wall thickening and gallstones.  He had a white count of 16,000 at that time.  He was discharged and came back in last night with worsening pain and was admitted.  Ultrasound again demonstrates small stones and sludge with a thickening gallbladder wall.  His white blood cell count was 14.6 on admission.  He only had mild elevation of SGOT at 41.  DESCRIPTION OF PROCEDURE:  He was placed supine on the operating table and a general anesthetic was administered.  The abdomen was sterilely prepped and draped.  Marcaine 0.5% was injected into the subumbilical region and a small incision was made in the subumbilical region and carried down to the midline fascia.  A 1 cm incision was made in the midline fascia.  The peritoneal cavity was entered bluntly and under direct vision.  A pursestring suture of 2-0 Vicryl was placed around the fascial edges.  A Hasson trocar was introduced into the peritoneal cavity and a pneumoperitoneum by insufflation of CO2 gas.  Next, the laparoscope was introduced.  The omentum was densely adherent to the gallbladder and liver, and I could not visualize the gallbladder.  I subsequently the 11 mm trocar through an epigastric incision and the 5 mm trocar in the right  lateral abdomen.  I was able to dissect the omental adhesions free from the liver and encountered underlying indurated inflamed gallbladder that was very firm.  Needle decompression of the gallbladder was performed.  The fundus was then grasped and retracted to the right shoulder and I continued to takedown omental adhesions.  We were then able to grasp the infundibulum.  Using blunt dissection, the cystic duct was isolated at its junction with the gallbladder neck.  It was clipped three times proximally and once distally, and divided.  The cystic artery was identified and clipped twice proximally, once distally and divided.  I then dissected the gallbladder free from the liver bed using electrocautery.  Once the gallbladder was freed from the liver bed, I irrigated the liver bed and controlled bleeding with electrocautery.  The gallbladder was then placed in an endopouch bag.  I then placed a drain into the gallbladder fossa and exited out the lateral 5 mm trocar port.  The drain was anchored to the skin with a 3-0 nylon suture.  The gallbladder was then removed in the endopouch bag through the subumbilical port.  The subumbilical fascia defect was closed by tightening up and tightening down the pursestring suture.  Next, all trocars were removed and the pneumoperitoneum was released.  The skin incisions were then closed with 4-0 Monocryl subcuticular stitches followed by Steri-Strips and sterile  dressings.  He tolerated the procedure well without any apparent complications and was taken to the recovery room in satisfactory condition. DD:  06/28/00 TD:  07/01/00 Job: [id number]

## 2011-02-09 NOTE — Op Note (Signed)
NAME:  Brett Castaneda, Brett Castaneda                            ACCOUNT NO.:  [id number]   MEDICAL RECORD NO.:  [id number]                   PATIENT TYPE:  OIB   LOCATION:  5735                                 FACILITY:  [facility]   PHYSICIAN:  Alford Highland. Rankin, M.D.                DATE OF BIRTH:  May 10, 1941   DATE OF PROCEDURE:  03/15/2003  DATE OF DISCHARGE:  03/16/2003                                 OPERATIVE REPORT   PREOPERATIVE DIAGNOSES:  1. Retinal detachment, left eye.  2. Proliferative vitreal retinopathy, left eye, stage C2, inferior and nasal     retinal detachment, left eye.  3. Epiretinal membrane and macular pucker, inducing number four.  4. Cystoid macular edema, left eye.   POSTOPERATIVE DIAGNOSES:  1. Retinal detachment, left eye.  2. Proliferative vitreal retinopathy, left eye, stage C2, inferior and nasal     retinal detachment, left eye.  3. Epiretinal membrane and macular pucker, inducing number four.  4. Cystoid macular edema, left eye.  5. 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   PROCEDURE:  1. Posterior vitrectomy with membrane peel, left eye.  2. Injection of vitreous substitute, temporary Perfluoron.  3. Endolaser pan-retinal photocoagulation for retinopexy purposes.  4. Removal of temporary Perfluoron fluid-air exchange, with permanent     injection of silicone oil 5000 centistokes vitreous substitute, left eye.   SURGEON:  Alford Highland. Rankin, M.D.   ANESTHESIA:  General endotracheal anesthesia.   INDICATIONS FOR PROCEDURE:  The patient is a 70 year old man who initially  had excellent visual acuity recovery to a level of 20/50 after a complex  retinal detachment, repaired with a vitrectomy, endolaser, scleral buckle,  and injection of gas.  He developed progressive macular pucker with  secondary cystoid macular edema.  This is an attempt to release  the macular  pucker, but also to relieve nonconnected, noncontiguous inferior nasal  retinal detachment, on the basis of proliferative vitreal retinopathy with  circumferential contracture of epiretinal tissues along the slip of the  buckle.  The patient understands the risks of anesthesia, the rare  occurrence of death, but also to the eye including, but not limited to  hemorrhage, infection, scarring, need for another surgery, no change in  vision, loss of vision, and progressive disease despite intervention, and  also the use of the silicone oil on a semi-permanent basis, and the  potential of requiring removal in the future.  He wishes to proceed,  understanding these risks.  After an informed consent was signed, he was  taken to the operating room.   DESCRIPTION OF PROCEDURE:  General endotracheal anesthesia was administered  without difficulty.  The left periocular region was sterilely prepped and  draped in the usual ophthalmic fashion.  A lid speculum was applied.  A  conjunctival peritomy was fashioned temporally and supranasally.  A 4 mm  infusion was secured 3.5 mm posterior to the limbus in the inferotemporal  quadrant.  The placement in the vitreous cavity was verified visually.  A  superior sclerotomy was then fashioned.  The [employer] was then placed  into position with the Biome attachment.  Entrance vitrectomies were  introduced and scleral depression was then used to further trim the vitreous  base, using vitreous base technique on the vitreous machine.  After shaving  the vitreous base further, the edges of epiretinal tissues  and  proliferations were noted.  These were engaged with Dorc forceps, and  significant traction on the underlying retina remained.  It was necessary to  use scissors segmentation to break the concentric contracture of this  portion of the epiretinal tissues.  Further elevation of these tissues was  then carried out anterior to the slope of  the buckle.  At this time,  residual fluid was identified in these two quadrants.  Endo diathermy was  then used to make retinotomies in the bases of these collections of fluid.  At this time the internal limiting membrane and epiretinal tissues over the  macular region were identified and removed in a continuous fashion using  Dorc forceps.   Again, at this time to further protect the macular region from dissection  peripherally, Perfluoron was then used to protect the macula further.  Thereafter the fluid-air exchange could be carried out without fear of  dissection of the fluid posterior to the macular region.  A fluid-air  exchange was then carried out over the macula, but overlying drainage of the  peripheral retinal holes identified previously.  Endolaser photocoagulation  was then carried out in the bed of these previous detachments for retinopexy  purposes.  Careful drying of the fluid Perfluoron at the vitreous layer  allowed for maintaining of these retinal reattachment sites.  Further  traction was identified.  Excellent scleral indentation was noted inferiorly  from the buckle.  At this time, the Perfluoron was then aspirated and all  chorioretinal scars and deep pockets were then aspirated as well, to assure  the unlikely event of residual Perfluoron.  Air was then allowed to freely  move through the vitreous cavity, to further allow for dissipation of the  Perfluoron.  At this time, the superior sclerotomies were then closed with  #7-0 Vicryl suture.  The area of silicone oil was then exchanged passively.  The remaining sclerotomy superiorly was closed.  The infusion was removed  and similarly closed with #7-0 Vicryl suture.  The conjunctiva was closed  with #7-0 Vicryl suture.  The subconjunctival injection with antibiotics was  then applied.  A sterile patch and Fox shield were then applied to the left  eye.  At this time the patient was awakened from anesthesia, and was  taken to the  recovery room in good and stable condition.  He tolerated the procedure  without complications.                                               Alford Highland Rankin, M.D.    GAR/MEDQ  D:  03/15/2003  T:  03/16/2003  Job:  [id number]

## 2011-02-09 NOTE — Op Note (Signed)
NAME:  Brett Castaneda, Brett Castaneda                            ACCOUNT NO.:  [id number]   MEDICAL RECORD NO.:  [id number]                   PATIENT TYPE:  OIB   LOCATION:  2550                                 FACILITY:  [facility]   PHYSICIAN:  Alford Highland. Rankin, M.D.                DATE OF BIRTH:  01-Sep-1941   DATE OF PROCEDURE:  10/18/2003  DATE OF DISCHARGE:                                 OPERATIVE REPORT   PREOPERATIVE DIAGNOSIS:  1. 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  2. Retained silicone oil implant--posteriorly, left.   POSTOPERATIVE DIAGNOSIS:  1. 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  2. Retained silicone oil implant--posteriorly, left.   OPERATION PERFORMED:  1. Posterior vitrectomy with endolaser panphotocoagulation, left eye, for     retinopexy purposes.  2. Removal of silicone oil implant--posterior vitreous cavity, left eye.   SURGEON:  Alford Highland. Rankin, M.D.   ANESTHESIA:  General endotracheal.   INDICATIONS FOR PROCEDURE:  The patient is a monocular 70 year old man who  has history of choroiditis, chorioretinal scarring who developed  rhegmatogenous retinal detachment complicated later on by proliferative  diabetic retinopathy.  The patient has undergone successful reattachment of  the posterior pole using liquid silicone as a long term vitreous substitute.  Today the oil will be removed after extensive retinopexy has successfully  maintained the posterior pole to be intact and attached.  The patient  understands the risks of anesthesia including the rare occurrence of death  but also to the eye including but not limited to from the procedure as well  as the previous condition, of  scarring, need for another surgery, no change in vision, loss of vision,  recurrent retinal detachment and need for  another surgery and even loss of  the eye.  He understands the risks and wished to proceed in order to  maintain and recover his best visual function.  Signed consent was obtained.   DESCRIPTION OF PROCEDURE:  In the operating room, general endotracheal  anesthesia was instituted without difficulty.  The left periocular region  was sterilely prepped and draped in the usual ophthalmic fashion.  A lid  speculum was applied.  Conjunctival peritomy was fashioned superiorly and  infratemporally.  A 4 mm infusion secured 3.5 mm posterior to the limbus in  the infratemporal quadrant.  Placement in the vitreous cavity verified  visually.  Superior sclerotomies then fashioned.  Wild microscope was placed  in position with the Biom attachment.  Vitreous silicone oil extractor was  then used to engage the silicone oil.  This was removed without difficulty  under steady intraocular pressure maintained.  Notable findings were of a  white nonperfused retinal anterior to the equator at the 6 o'clock position.  Retinectomy was then performed  in this area to release traction, so as to  prevent spread of this from further contracture.  There was no bleeding.  At  this time irrigation of the of the iris and posterior chamber recess was  then carried out so as to mobilize small amounts of silicone oil bubbles.  These were all removed.  Endolaser panphotocoagulation was placed around the  posterior pole for two more rows so as to confirm that the retina was  attached.  At this time superior sclerotomies were then closed with 7-0  Vicryl suture.  The infusion removed and similarly closed with 7-0 Vicryl  suture.  Intraocular pressure was slightly soft and for this reason 0.1 mL  of 100% SF6 was applied so as to prevent hypotony in the early postoperative  course of the procedure.  Conjunctiva closed with 7-0 Vicryl.  Subconjunctival injection of antibiotic and steroid applied.  The patient  tolerated the  procedure well without complication. The patient was then  transferred to the recovery room in good and stable condition.                                               Alford Highland Rankin, M.D.    GAR/MEDQ  D:  10/18/2003  T:  10/18/2003  Job:  [id number]

## 2011-02-09 NOTE — Op Note (Signed)
NAME:  Brett Castaneda, Brett Castaneda                            ACCOUNT NO.:  [id number]   MEDICAL RECORD NO.:  [id number]                   PATIENT TYPE:  OIB   LOCATION:  5704                                 FACILITY:  [facility]   PHYSICIAN:  Alford Highland. Rankin, M.D.                DATE OF BIRTH:  23-Dec-1940   DATE OF PROCEDURE:  01/23/2003  DATE OF DISCHARGE:  01/24/2003                                 OPERATIVE REPORT   PREOPERATIVE DIAGNOSIS:  Rhegmatogenous retinal detachment, left eye,  temporal, macula threatened.   POSTOPERATIVE DIAGNOSES:  1. Rhegmatogenous retinal detachment, left eye, temporal, macula threatened.  2. 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   PROCEDURES:  1. Posterior vitrectomy with membrane peel, OS.  2. Endolaser panretinal photocoagulation, OS, so as to repair retinal     detachment.  3. Scleral buckle using 240, 70, and 287 elements.  4. Injection of vitreous substitute, SF6 20%.  5. Endodiathermy for retinotomy placement because of residual fluid.  6. Temporary injection of Perfluoron, OS.   SURGEON:  Alford Highland. Rankin, M.D.   ANESTHESIA:  General endotracheal anesthesia.   INDICATION FOR PROCEDURE:  The patient is a 70 year old man who is  monocular, who has had profound visual field loss nasally in the left eye  over the last 24 hours.  This has been progressive.  He was found earlier in  the office today to have rhegmatogenous detachment with a hole seemingly at  the age of chorioretinitis scars but possibly with a hold in the highly  bullous portion of the detachment, which extends from the 1:30 position  inferotemporally down to the 4:30 position.   Patient and the wife understand that this is an attempt to reattach  the  retina.  They understand that the abnormal position of the posterior retinal  hole from chorioretinal scarring from a history of now-inactive  chorioretinitis posed the requirement for vitrectomy to close the posterior  holes and encircle these with Endolaser photocoagulation and place the  intraocular vitreous substitutes.  He understands these risks and wishes to  proceed, with anesthesia, including the risk of rare occurrence of death,  but  also the risks  to the eye, including but not limited to hemorrhage,  infection, scarring, need for further surgery, no change in vision, loss of  vision, and progressive disease despite intervention.  He particularly  understands the need for use of an intraocular substitute, particularly gas,  which will potentially, and will, hamper his visual function over the next  seven to 14 days until the gas bubble has diminished by one-half.  He  understands these issues  and wishes to proceed and is willing to take that  issue rather than using the oil as an intraocular tamponade, which would  require a second procedure.   DESCRIPTION OF PROCEDURE:  At this time the patient was taken to the  operating room.  In the operating room general endotracheal anesthesia was  instituted without difficulty.  The left periocular region was sterilely  prepped and draped in the usual ophthalmic fashion.  A lid speculum applied.  Conjunctival peritomy fashioned 360 degrees with relaxing incision made  inferotemporally and superonasally.  The rectus muscles isolated on 2-0 silk  ties.  A 287 solid-silicone explant was selected after indirect  ophthalmoscopy confirmed temporal detachment with a large retinal tear at  the 2:30 position with extension to large chorioretinal scarrings posterior  to the equator.  A 287 solid-silicone explant was elected and placed from  the 1:30 to the 4:30 position with a 240 encircling band as well as a 70  Watzke sleeve placed in the  superonasal quadrant.  Mersilene 5-0 mattress  sutures were used to place these sutures.  These were tied permanently with  excellent indentation.  At this time a 3 mm infusion was then secured 3.5 mm  posterior to the limbus in the inferotemporal quadrant.  Placement in the  vitreous cavity verified visually.  Superior sclerotomy was then fashioned.  A wall microscope placed in position with BIOM attached.  Core vitrectomy  was then begun.  The hyaloid was detached at the mid-vitreous.  This was  circumcised 360 degrees.  Dense old fibrous adhesions along the  chorioretinal scars at and slightly posterior to the equator were noted 360  degrees, and no attempt was made to remove all these or strip them off the  retina because of the risk of forming further retinal holes in this areas.  Nonetheless, it was trimmed 360 degrees, including over the bed of the  detachment.  A hole was confirmed at the 2:30 position.  Areas along the  chorioretinal scars posterior to the equator at the 4 o'clock meridian as  well as at the 2 o'clock meridian were suspected.   At this time Endodiathermy was then used to mark the edges of the retinal  hole.  An attempt was then made to use Perfluoron to protect the macula and  provide a fluid-air exchange overlying the Perfluoron bubble.  This was  carried out without difficulty.  The retina was seemingly flattened nicely.  The Perfluoron was then slowly removed and fastidious removal of subretinal  fluid from the primary retinal hole was attempted over about a 15-minute  procedure prior to removing the Perfluoron.  Then the Perfluoron was  removed.  Initially there was no posterior pooling of the subretinal fluid  and then later there was clearly pooling of subretinal fluid temporal to the  macula and posterior to the slip of the buckle.  Light endodiathermy was  used to open this area inferior to the horizontal raphe and then all the remaining subretinal fluid  could be removed.  Endolaser photocoagulation was  placed posterior to the buckle and demarcation at the equator and posterior  to the equator 360 degrees.   It  must be also added that prior to fluid-air exchange, the retinal  cryopexy had been applied in the bed of the buckle near the retinal tear  previously mentioned.   At this time all Perfluoron was removed.  A small washing of BSS was placed  just over the posterior  pole so as to confirm mobilization of all  Perfluoron.  The remaining BSS was aspirated and the remainder of the  Perfluoron was allowed to vent. There was a pneumicropic left.  At this time  an air-SF6 20% exchange completed.  The superior sclerotomy was closed with  7-0 Vicryl suture.  The infusion removed and similarly closed with 7-0  Vicryl suture.  Conjunctiva and Tenon's were brought forward and closed in  layers with 7-0 Vicryl suture.  The bed of the buckle had been irrigated  with bug juice.  A subconjunctival injection of antibiotic and steroid  applied.  The patient tolerated the procedure well without complication.  The patient was taken to the recovery room in good and stable condition.                                               Alford Highland Rankin, M.D.    GAR/MEDQ  D:  01/23/2003  T:  01/24/2003  Job:  [id number]

## 2011-02-09 NOTE — H&P (Signed)
NAME:  Brett Castaneda, Brett Castaneda NO.:  [id number]   MEDICAL RECORD NO.:  [id number]          PATIENT TYPE:  INP   LOCATION:                               FACILITY:  [facility]   PHYSICIAN:  Dyke Brackett, M.D.    DATE OF BIRTH:  06/13/1941   DATE OF ADMISSION:  11/17/2004  DATE OF DISCHARGE:                                HISTORY & PHYSICAL   CHIEF COMPLAINT:  Left knee pain.   HISTORY OF PRESENT ILLNESS:  The patient is a 70 year old white male with a  significant history of rheumatoid arthritis.  He has been having severe  progressively worsening left knee pain over the last several years.  The  patient did have a right total knee arthroplasty performed in 2004 with good  results.  The patient states that he has a constant worsening, deep, aching-  type pain when he is up walking around or standing on his feet for long  periods of time.  He has a sharp, shooting, stabbing pain with awkward  movements.  He does have popping, cracking, and grinding within the knee,  occasionally causing it to give out.  It is mostly located along the medial  joint.  It does radiate down the posterior aspect of the leg at times.  He  denies any night pain.   ALLERGIES:  1.  VICODIN, causing nausea.  2.  MORPHINE, causing hallucinations.   CURRENT MEDICATIONS:  1.  Humira 40 mg injection every other week, stopped as of this time.  2.  Timolol eye drops 0.5%, 1 drop b.i.d.  3.  Pilocarpine 1%, 1 drop t.i.d.  4.  Xalatan 0.005%, 1 drop q.h.s.   PRESENT MEDICAL HISTORY:  1.  Rheumatoid arthritis.  2.  Reflux.  3.  Blind right eye.  4.  Two thirds vision, left eye, with macular degeneration and glaucoma.   PAST SURGICAL HISTORY:  1.  Cholecystectomy in 2001.  2.  Right knee arthroscopy and debridement for staph infection in 1999.  3.  Right total knee arthroplasty in 2004.  4.  Carpal tunnel surgery, left hand.  5.  Detached retina in 2003 and 2004.   The patient denies any  complications with the above-mentioned surgical  procedures except for the hallucinations with the morphine with the total  joint replacement.   SOCIAL HISTORY:  The patient is a very healthy-appearing active 70 year old  white male.  Denies any history of smoking or alcohol use.  He is married  and lives with his wife.  He has several grown children.  He lives in a one-  story house.  He is currently disabled.   FAMILY PHYSICIAN:  Dr. __________.   OPHTHALMOLOGIST:  Dr. __________.   RHEUMATOLOGIST:  Dr. Kellie Simmering.   FAMILY MEDICAL HISTORY:  Mother and father are both deceased; mother in her  sleep at 69 years of age, father from a heart attack at 72.  The patient has  three sisters alive with a combination of history of coronary artery  disease, MIs, CABG, diabetes, and hypertension.   REVIEW OF SYSTEMS:  Review of systems is positive for glasses.  He does have  occasional problems with reflux for which he uses over-the-counter  medications.  Otherwise, all other review of systems categories are negative  and noncontributory if not mentioned above or related to his knees.   PHYSICAL EXAMINATION:  VITALS:  Height is 6 feet.  Weight is 190 pounds.  Blood pressure is 124/74.  Pulse is 76 and regular.  Respirations 12.  The  patient is afebrile.  GENERAL:  This is a healthy-appearing, physically fit, 70 year old white  male.  He ambulates with a slight left-sided limp.  He does get on and off  the exam table fairly easily.  HEENT:  Head was normocephalic.  Left pupil was reactive; right pupil  deviated to the right.  Left extraocular motions were intact; right would  only go to midline.  Sclerae were not icteric.  External ears were without  deformities.  Gross hearing is intact.  Oral buccal mucosa was pink and  moist.  NECK:  Supple.  No palpable lymphadenopathy.  Thyroid region was nontender.  The patient had good range of motion of the cervical spine without any  tenderness.   CHEST:  Lung sounds were clear and equal bilaterally.  No wheezes, rales, or  rhonchi.  HEART:  Regular rate and rhythm.  No murmurs, rubs, or gallops.  ABDOMEN:  Soft, nontender.  Bowel sounds were normoactive.  EXTREMITIES:  Upper extremities were symmetric in size and shape.  He had  full range of motion of his shoulders, elbows, and wrists.  Motor strength  was 5/5.  Lower extremities:  Right and left hip had full extension, flexion  up to 130 degrees with 20 degrees internal and external rotation without any  difficulty or tenderness.  Right knee had a well-healed midline surgical  incision, had full extension, flexion to 120 at about a 5 degrees  valgus/varus laxity, had the typical total knee clunking.  No effusion.  No  discomfort.  The calf is soft and nontender.  Left knee was without any  effusion, had coarse crepitus under the patella with full extension, flexion  back to 120 degrees.  He was tender along the medial joint line.  He had no  instability.  The calf is nontender.  Ankles were symmetrical with good  dorsi and plantar flexion.  PERIPHERAL VASCULAR:  Carotid pulses were 2+.  No bruits.  Radial pulses 2+.  Posterior tibial pulses were 1+.  He had no lower extremity edema or venous  stasis changes.  NEUROLOGIC:  The patient was conscious, alert, and appropriate except for  the loss of vision in his right eye.  The left eye had reported two thirds  vision.  Cranial nerves II-XII were grossly intact.  He had no gross  neurologic defects noted.  BREASTS:  Deferred at this time.  RECTAL:  Deferred at this time.  GENITOURINARY:  Deferred at this time.   IMPRESSION:  1.  End-stage rheumatoid arthritis, left knee.  2.  Rheumatoid arthritis.  3.  Blind right eye.  4.  Two thirds vision left eye with macular degeneration and glaucoma.  5.  Reflux disease.   PLAN:  The patient will undergo all routine labs and tests prior to having a left total knee arthroplasty by Dr.  Madelon Lips at [facility] on  November 17, 2004.  The patient has donated three units of autologous blood  for this procedure.      RWK/MEDQ  D:  11/09/2004  T:  11/09/2004  Job:  [id number]

## 2011-02-09 NOTE — Op Note (Signed)
   NAME:  Brett Castaneda, Brett Castaneda NO.:  [id number]   MEDICAL RECORD NO.:  [id number]                   PATIENT TYPE:  INP   LOCATION:  5037                                 FACILITY:  [facility]   PHYSICIAN:  Thera Flake., M.D.             DATE OF BIRTH:  12-02-1940   DATE OF PROCEDURE:  06/16/2003  DATE OF DISCHARGE:                                 OPERATIVE REPORT   PREOPERATIVE DIAGNOSIS:  Status post pyarthrosis of the knee with rheumatoid  arthritis.   POSTOPERATIVE DIAGNOSIS:  Status post pyarthrosis of the knee with  rheumatoid arthritis.   OPERATION PERFORMED:  Right total knee replacement (LCS cemented knee with  large femur, large patella, size 5 tibia, 12.5 mm bearing) rotating  platform.   SURGEON:  Dyke Brackett, M.D.   ASSISTANT:  Madilyn Fireman, P.A.-C.   TOURNIQUET TIME:  One hour and 20 minutes.   ANESTHESIA:   DESCRIPTION OF PROCEDURE:  Sterile prep and drape.  Exsanguination of the  leg, placement of tourniquet, 350 mmHg.  Medial parapatellar approach to the  knee made.  The synovial tissue was diseased although it was somewhat more  of a burned out type synovitis.  Synovectomy was carried out followed by  cutting the tibia about 2 to 3 mm below the most diseased compartment.  Moderately severe wear and damage was noted.  He was sized to be a large  femur.  Anterior and posterior femoral cuts were made with a 4 degree valgus  cut, anterior posterior Chamfers, keyhole for prosthesis created followed by  keel for  the tibia.  Medial side was moderately tight requiring stripping  of the medial collateral ligament to balance ligaments.  Patella was cut.  Patella was extremely large. It was cut leaving about 17 mm of native  patella, prepacked patella.  Trial reduction carried out.  Deemed the best  combination of mobility with stability of the bearing was with a 12.5 mm  bearing.  The final components were cemented in, tibia followed by femur  and  patella with a trial bearing again placed but this was removed.  The  tourniquet was released to allow inspection of the posterior aspect of the  knee.  There was no excessive bleeding.  Excessive cement was removed as  well.  The final polyethylene was inserted.  Range of motion again checked,  closure was accomplished with #5 FiberWire, #1 Ethibond 2-0 Vicryl and skin  clips.  Light sterile dressing applied.                                               Thera Flake., M.D.    WDC/MEDQ  D:  06/16/2003  T:  06/16/2003  Job:  [id number]

## 2011-02-09 NOTE — Op Note (Signed)
NAME:  SEGER, JANI NO.:  [id number]   MEDICAL RECORD NO.:  [id number]          PATIENT TYPE:  INP   LOCATION:  2899                         FACILITY:  [facility]   PHYSICIAN:  Thera Flake., M.D.DATE OF BIRTH:  09-22-1941   DATE OF PROCEDURE:  11/17/2004  DATE OF DISCHARGE:                                 OPERATIVE REPORT   PREOPERATIVE DIAGNOSES:  Severe rheumatoid arthritis with medial tibial  plateau complex bony cystic lesion, prepatellar bursa.   POSTOPERATIVE DIAGNOSES:  Severe rheumatoid arthritis with medial tibial  plateau complex bony cystic lesion, prepatellar bursa.   OPERATION:  1.  Left total knee replacement, (cemented LCS large femur patella and size      5 tibia with 10 mm bearing).  2.  Bone grafting of tibial plateau cyst.  3.  Excision of prepatellar bursa with fibrinous loose bodies.   SURGEON:  Dr. Madelon Lips.   ANESTHESIA:  General.   TOURNIQUET TIME:  Approximately one hour and 15 minutes.   ASSISTANT:  Legrand Pitts. Duffy, P.A.   ANESTHESIA:  General.   Sterile prep and drape and exsanguination of the leg and inflation of the  tourniquet to 375 and a straight skin incision, medial parapatellar  approach.  There was an extensive prepatellar bursa encountered with  fibrinous loose bodies requiring extensive debridement of the prepatellar  area.  A medial parapatellar approach immediately was made.  A complete  synovectomy was carried out.  The tibial plateau was cut about 2 mm below  the most diseased medial compartment with the second provisional cut made to  obtain equal flexion and extension gap.  There was a cystic area on the  anterior medial plateau that required extensive dissection and curetting  followed by cancellous bone grafting prior to insertion of the components  but the components were stably implanted on good bone.  The anterior and  posterior femoral cut was cut with the 10 degree flexion gap, 4 degrees of  valgus  cut on the femur followed by chamfer cuts with release of the  posterior cruciate ligament and excision of the meniscal remnants, etc.  Again, the bone graft was placed.  The tibia was cut with a keel hole  slightly shifting the tibia a little more towards the lateral side to  prevent putting the weightbearing aspect of the tibia on the larger portion  of the cyst although the cyst was bone grafted.  The patella was cut,  leaving about 15 to 16 mm of native patella.  The three peg patella was  used.  The trial components were inserted.  Prior to this, the copious  irrigation was carried out and this was actually, the irrigation was carried  out prior to the bone grafting of the cyst.  Final components were placed  with the cement in the bony state with 750 mg of Kefurox per bag of cement  for antibiotic-impregnated cement was used.  The excess cement was allowed  to harden and a trial bearing was left in place followed by removing the  trial banding.  When  the cement was hardened, the tourniquet was released.  Small bleeders were coagulated.  No excessive bleeding noted from the  posterior aspect of the knee.  No excessive amounts of cement in the  posterior aspect of the knee.  Hemostasis was obtained followed by insertion  of a Hemovac in the lateral gutter and closure of the capsule with #1  Ethibond.  Again, the patient had essentially had full extension on the  table, excellent stability of the collaterals, no tendency to bearing spin  out.  It could be flexed easily to 115-120 degrees without any difficulty  being noted.  We closed the subcutaneous tissues with 2-0 Vicryl, the skin  with skin clips, lightly compressive sterile dressing, knee immobilizer  applied.      WDC/MEDQ  D:  11/17/2004  T:  11/17/2004  Job:  [id number]

## 2011-02-09 NOTE — Discharge Summary (Signed)
NAME:  Brett Castaneda, Brett Castaneda NO.:  [id number]   MEDICAL RECORD NO.:  [id number]          PATIENT TYPE:  INP   LOCATION:  5022                         FACILITY:  [facility]   PHYSICIAN:  Dyke Brackett, M.D.    DATE OF BIRTH:  August 01, 1941   DATE OF ADMISSION:  11/17/2004  DATE OF DISCHARGE:  11/21/2004                                 DISCHARGE SUMMARY   ADMITTING DIAGNOSES:  1. End-stage rheumatoid arthritis left knee.  2. Rheumatoid arthritis.  3. Blind right eye.  4. Two-thirds vision left eye, with macular degeneration and glaucoma.  5. Reflux disease.     DISCHARGE DIAGNOSES:  1. Status post left total knee arthroplasty.  2. Hyponatremia.  3. Hallucinations with pain medications.  4. Poor pain control with Percocet and OxyContin.  5. Gastric reflux disease.  6. Rheumatoid arthritis.  7. Blind right eye.  8. Left eye with two-thirds vision, with macular degeneration and      glaucoma.     HISTORY OF PRESENT ILLNESS:  Brett Castaneda is a 70 year old male with a  significant history of rheumatoid arthritis.  The patient has had  progressively worsening left knee pain over the past several years.  The  patient underwent a right total knee arthroplasty in 2004, with good  results.  Left knee pain is described as a constant deep aching type pain  when he is up walking or standing on his feet for long periods of time.  The  pain does have a sharp, shooting, stabbing quality with all movements.  Mechanical symptoms positive for giving way.  Most of his knee pain is  located in the medial aspect of the joint.  Pain does radiate down the  posterior aspect of the left leg at times.  The patient denies any waking  pain.   ALLERGIES:  1. VICODIN causes nausea.  2. MORPHINE causes hallucinations.     CURRENT MEDICATIONS:  1. __________ 40 mg injection q. week, stopped at this time.  2. Timolol eye drops 0.5% 1 drop b.i.d.  3. Pilocarpine 1% 1 drop t.i.d.  4. Xalatan  0.005% 1 drop at bedtime.     SURGICAL PROCEDURE:  The patient was taken to the operating room on November 17, 2004 by Dr. Madelon Lips, assisted by Arnoldo Morale, P.A.  The patient was  placed under general anesthesia and underwent a left total knee replacement.  The following components were used:  An LCS large femur, size 5 tibia with  10 mm bearing, and three-peg __________ large patella.  The patient  tolerated the procedure well and returned to recovery in good stable  condition.   CONSULTS:  The following consults were obtained while the patient was  hospitalized:  1. PT/OT.  2. Case management.     HOSPITAL COURSE:  On postop day one, patient with very poor pain control.  Pain medicines were changed.  The patient was placed on Oxy-IR.  Patient  also with hyponatremia, asymptomatic.  This was treated with decrease in the  patient's free water intake.  Salt was added to the  diet.  Reflux was  asymptomatic.  Otherwise, the patient was afebrile, vital signs stable.  H&H  were 10.4 and 30.7.   By postop day two, the patient remained with poor pain control.  Therefore,  pain medications were once again changed.  The patient was placed on  [employer].  Hyponatremia remained unchanged at 130.  Otherwise, the  patient was afebrile, and vital signs were stable.  On postop day three, the  patient reported some hallucinations early in the day, but at the time of  visit in the hospital, the patient was without hallucinations.  He was  complaining of some heartburn, and therefore was placed on Protonix.  Hyponatremia was improving.  Sodium was at 132.   On postop day three, the patient reported pain under control, with no  hallucinations.  The patient was doing well with physical therapy, walking  250 feet with a rolling walker, transferring independently.  The patient  denied any heartburn.  The patient ws afebrile, vital signs stable.  Therefore, the patient was discharged home on  postop day four in good stable  condition.   LABORATORIES:  Routine labs on admission:  CBC:  White count 7,500,  hemoglobin 11.8, hematocrit 34.7, platelets 286.  H&H on discharge 9.8 and  28.2.  Coags on admission:  All values were within normal limits.  Routine  chemistries on admission:  Sodium 140, potassium 4.9, chloride 106,  bicarbonate 30, glucose 101, BUN 7, creatinine 1.1.  Hepatic enzymes on  admission:  All values within normal limits.  Urinalysis was negative on  admission.  ECG dated November 13, 2004 showed normal sinus rhythm, with  occasional premature ectopic complexes, otherwise normal.  Heart rate was 73  beats per minute.  PR interval 134 msec, PRT axis of 40, 1724.   DISCHARGE INSTRUCTIONS:   MEDICATIONS:  The patient is to resume home medications except for  __________, hold for one to two weeks, if tolerated..   The following medications are to be added:  1. Lovenox 40 mg 1 injection daily at 8:00 a.m. x 10 days.  2. Demerol 50 mg 1 tab q.4-6 h. to be taken with Phenergan 25 mg 1 tab q.4-      6 h. for pain.  3. Robaxin 500 mg 1 tab q.4-6 h. p.r.n. spasm.  4. Iron 325 mg 1 tab daily x 28 days.     ACTIVITY:  The patient is touchdown weightbearing with a rolling walker.  Home health/PT, Genevieve Norlander.   DIET:  No restrictions.   WOUND CARE:  The patient is to keep wound clean and dry.  Change dressing  daily.  May shower after two days of no drainage.  The patient is to call  office with temperatures greater than 101 degrees Fahrenheit, pain that is  not controlled, or any signs of infection.   SPECIAL INSTRUCTIONS:  CPM 0-90 degrees six to eight hours a day, increase  by 10 degrees a day.   The patient needs a follow-up appointment with Dr. Madelon Lips in the office  approximately 10 days from discharge.  The patient is to call the office at  [phone] for an appointment.      GC/MEDQ  D:  02/01/2005  T:  02/01/2005  Job:  [id number]  cc:   Thera Flake., M.D.  [address] [place]  [place] [address]  Fax: [phone]

## 2011-02-09 NOTE — H&P (Signed)
NAME:  Brett Castaneda, Brett Castaneda                            ACCOUNT NO.:  [id number]   MEDICAL RECORD NO.:  [id number]                   PATIENT TYPE:  INP   LOCATION:                                       FACILITY:  [facility]   PHYSICIAN:  Dyke Brackett, M.D.                 DATE OF BIRTH:  Feb 09, 1941   DATE OF ADMISSION:  06/16/2003  DATE OF DISCHARGE:                                HISTORY & PHYSICAL   CHIEF COMPLAINT:  Chronic pain, right knee.   HISTORY OF PRESENT ILLNESS:  The patient is a 70 year old white male with a  Worker's Compensation injury and subsequent infection in his right knee in  the year of 1999.  At that time, the patient had a methicillin-sensitive  Staphylococcus infection with some damage to the knee.  He has been having  chronic pain with any weightbearing activity.  It now has gotten so he has  significant discomfort even for sitting for long periods of time.  He  describes the pain as a deep dull aching sensation.  He does have popping  and grinding within the knee.  He has no swelling.  The patient has also got  rheumatoid arthritis.   ALLERGIES:  VICODIN causing nausea.   CURRENT MEDICATIONS:  1. Arava 20 mg once a day.  Patient advised to stop as of June 11, 2003.  2. Humira 40 mg injection once every other week on Fridays.  To take his     last injection this Friday and will skip the week of the surgery.  3. Omeprazole 20 mg p.o. q.h.s.  4. Baby aspirin daily.  5. Oruvail 200 mg p.o. daily p.r.n.  6. Percocet p.r.n.   PAST MEDICAL HISTORY:  1. Rheumatoid arthritis.  2. Blindness, right eye.  3. Left eye retinal detachment currently being treated by Dr. Luciana Axe with     therapy with oil injection into the eye.  Has restrictions of laying     supine for any length of time.  He does have decreased or altered vision     in his left eye.  4. History of methicillin-sensitive Staphylococcus infection in 1999.   PAST SURGICAL HISTORY:  1. Cataract  surgery in 1996.  2. Right knee arthroscopic lavage due to staphylococcal infection in 1999.  3. Repeat right knee arthroscopy due to pain and clean out of right knee in     2000.  4. Cholecystectomy in 2001.  5. Carpal tunnel release in 2003.  6. Detached retina in the left eye with surgery by Dr. Luciana Axe in 2004 x 2.   The patient denies any complications with the above-mentioned surgical  procedures.   SOCIAL HISTORY:  The patient is a 70 year old white male, physically fit and  healthy appearing.  He does have a history of smoking, but quit in 1984 with  one pack  a day and nothing since that time.  He denies alcohol use.  He is  married.  He does have three grown children.  He lives in a [place] house  with three steps to the main entrance.  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   FAMILY PHYSICIAN:  Gordy Savers, M.D., [phone].   RHEUMATOLOGIST:  Aundra Dubin, M.D.   Vanna ScotlandJillyn Hidden A. Rankin, M.D.   FAMILY MEDICAL HISTORY:  His mother is deceased at 60 of unknown causes.  His father is deceased at 67 from a heart attack.  The patient has three  sisters alive, one with a history of hypertension, one with cardiac disease,  MI, and bypass, and the third in good medical health.   REVIEW OF SYSTEMS:  Positive for reflux which is fairly well controlled with  the omeprazole.  He does have multiple visual problems with his left ear  related to the retinal detachment and the oil implant within the eye.  He  does have chronic postnasal drip with occasional cough.  Denies any fevers  or chills.  Otherwise all of the review of systems is negative other than  indicated above.   PHYSICAL EXAMINATION:  HEIGHT:  6 feet.  WEIGHT:  187 pounds.  VITAL SIGNS:  Pulse 72 and regular, respirations 14, blood pressure 128/82.  GENERAL APPEARANCE:  This is a healthy-appearing, well-developed, white male  who  ambulates without any significant limp, but has difficulty sitting with  his legs draped over the exam table for any period of time and prefers to  have his leg almost fully extended.  HEENT:  The head was normocephalic.  His right eye pupil was about 2 mm with  opacity, but it was reactive to accommodating light from the left eye.  It  does deviate laterally.  The right pupil is 2 mm and reactive.  The sclerae  are not icteric.  The conjunctivae are pink and moist.  Extraocular  movements intact.  The external ears are without deformities.  Canals  patent.  TMs were pearly and gray, but the left TM did have some sclerotic  regions.  Gross hearing is intact.  The nasal septum was deviated to the  left.  Oral buccal mucosa was pink and moist without lesions.  Dentition was  in good repair.  The uvula was midline.  The patient is able to swallow  without any difficulty.  NECK:  Supple.  No palpable lymphadenopathy.  The thyroid region was  nontender.  He had good range of motion of his cervical spine without any  difficulty.  He had no tenderness along the spinal column to percussion.  CHEST:  Lung sounds were clear and equal bilaterally.  No wheezing, rhonchi,  rales, or rubs noted.  HEART:  Regular rate and rhythm.  S1 and S2 are auscultated.  No murmurs,  rubs, or gallops noted.  ABDOMEN:  Soft and nontender.  Bowel sounds normoactive throughout.  The CVA  region was nontender.  EXTREMITIES:  The upper extremities were symmetric in size and shape.  He  had good range of motion of his shoulders, elbows, and wrists.  Motor  strength was 5/5.  Lower Extremities:  The right and left hips had full  extension.  Flexion up to 120 degrees with 20 degrees of internal and  external rotation without any difficulty or tenderness.  The right knee had  some faint  arthroscopic ports noted.  No signs of erythema.  No effusion. No soft tissue swelling.  He was tender along the medial and lateral joint   lines.  He had a moderate amount of crepitus on the patella with range of  motion.  He had full extension and flexion back to 130 degrees.  He had only  slight medial to lateral laxity with valgus and varus stressing.  The calf  was nontender.  The left knee was also without any signs of erythema,  ecchymosis, or palpable effusions.  He was tender also along the medial and  lateral joint line.  He had about 5 degrees of medial to lateral laxity with  valgus varus stressing and a moderate amount of crepitus on the patella.  He  had full extension flexion back to 130 degrees.  No calf tenderness.  The  ankles were symmetrical with good dorsiflexion and plantar flexion.  PERIPHERAL VASCULAR:  Carotid pulses were 2+ with no bruits.  Radial pulses  2+.  Posterior tibial and dorsalis pedis pulses were 2+.  He had no lower  extremity edema noted at this time.  NEUROLOGIC:  The patient was conscious, alert, and appropriate.  He held an  easy conversation with the examiner.  Cranial nerves II-XII were grossly  intact with the exception of loss of vision in his right eye.  The patient  was grossly intact to light touch sensation from head to toe.  He had no  gross neurologic defects noted.  BREASTS:  Deferred at this time.  RECTAL:  Deferred at this time.  GENITOURINARY:  Deferred at this time.   PLAN:  Dr. Madelon Lips had a conversation with Dr. Kellie Simmering about the patient's  rheumatologic medications.  The patient is to take his last dose of Arava on  this Friday and we will hold for a week or two postoperatively.  He is to  take his last dose of Humira on the Friday.  He will skip the normal week  postoperatively and then resume in one week.  Dr. Madelon Lips also had a  discussion with Dr. Orvan Falconer about the patient's previous history of  staphylococcal infection.  It was researched and he did have a methicillin-  sensitive Staphylococcus aureus.  There are no specific changes in routine  preoperative or  postoperative care.  He just feels that he is possibly at  slightly increased risk for reinfection.  The patient has also been cleared  by Dr. Luciana Axe for this surgical procedure.  There are precautions that must  be taken due to his detached retina and oil implantation in the ocular  cavity.  The patient is not to be laid completely supine for any long  periods of time.  Any attempts to elevated his head intraoperatively would  be beneficial.  Otherwise the patient will undergo all of the routine labs  and tests prior to have a right total knee arthroplasty by Dr. Madelon Lips at  [facility] on June 16, 2003.      Jamelle Rushing, Arnetha Courser, M.D.    RWK/MEDQ  D:  06/10/2003  T:  06/10/2003  Job:  [id number]

## 2011-02-23 ENCOUNTER — Encounter: Payer: Self-pay | Admitting: Internal Medicine

## 2011-02-23 ENCOUNTER — Ambulatory Visit (INDEPENDENT_AMBULATORY_CARE_PROVIDER_SITE_OTHER): Payer: Medicare Other | Admitting: Internal Medicine

## 2011-02-23 DIAGNOSIS — I10 Essential (primary) hypertension: Secondary | ICD-10-CM

## 2011-02-23 DIAGNOSIS — Z125 Encounter for screening for malignant neoplasm of prostate: Secondary | ICD-10-CM

## 2011-02-23 DIAGNOSIS — E785 Hyperlipidemia, unspecified: Secondary | ICD-10-CM

## 2011-02-23 LAB — PSA: PSA: 2.21 ng/mL (ref 0.10–4.00)

## 2011-02-23 LAB — LIPID PANEL
LDL Cholesterol: 77 mg/dL (ref 0–99)
Total CHOL/HDL Ratio: 3
Triglycerides: 50 mg/dL (ref 0.0–149.0)

## 2011-02-23 LAB — TSH: TSH: 1.26 u[IU]/mL (ref 0.35–5.50)

## 2011-02-23 NOTE — Progress Notes (Signed)
  Subjective:    Patient ID: Brett Castaneda, male    DOB: 1941/04/07, 70 y.o.   MRN: [id number]  HPI  is a 70 year old patient who is seen today for followup of his hypertension. This has been well controlled. He is followed by rheumatology for rheumatoid arthritis and this has done well he does get blood work done every 3 months due to methotrexate treatment. Pravachol was placed on hold for a period due to swelling of the tongue. This has been resumed a few weeks ago without reoccurrence. Is here today for fasting lab. Denies any cardiopulmonary complaints feels quite well.    Review of Systems  Constitutional: Negative for fever, chills, appetite change and fatigue.  HENT: Negative for hearing loss, ear pain, congestion, sore throat, trouble swallowing, neck stiffness, dental problem, voice change and tinnitus.   Eyes: Negative for pain, discharge and visual disturbance.  Respiratory: Negative for cough, chest tightness, wheezing and stridor.   Cardiovascular: Negative for chest pain, palpitations and leg swelling.  Gastrointestinal: Negative for nausea, vomiting, abdominal pain, diarrhea, constipation, blood in stool and abdominal distention.  Genitourinary: Negative for urgency, hematuria, flank pain, discharge, difficulty urinating and genital sores.  Musculoskeletal: Negative for myalgias, back pain, joint swelling, arthralgias and gait problem.  Skin: Negative for rash.  Neurological: Negative for dizziness, syncope, speech difficulty, weakness, numbness and headaches.  Hematological: Negative for adenopathy. Does not bruise/bleed easily.  Psychiatric/Behavioral: Negative for behavioral problems and dysphoric mood. The patient is not nervous/anxious.        Objective:   Physical Exam  Constitutional: He is oriented to person, place, and time. He appears well-developed.  HENT:  Head: Normocephalic.  Right Ear: External ear normal.  Left Ear: External ear normal.  Eyes: Conjunctivae  and EOM are normal.  Neck: Normal range of motion.  Cardiovascular: Normal rate and normal heart sounds.        Decreased left posterior tibial pulse  Pulmonary/Chest: Breath sounds normal.  Abdominal: Bowel sounds are normal.  Musculoskeletal: Normal range of motion. He exhibits no edema and no tenderness.  Neurological: He is alert and oriented to person, place, and time.  Psychiatric: He has a normal mood and affect. His behavior is normal.          Assessment & Plan:  Hypertension stable Dyslipidemia. We'll check a fasting lipid profile  Recheck 6 months

## 2011-02-23 NOTE — Patient Instructions (Signed)
Limit your sodium (Salt) intake  Please check your blood pressure on a regular basis.  If it is consistently greater than 150/90, please make an office appointment.  Return in 6 months for follow-up   

## 2011-03-06 ENCOUNTER — Telehealth: Payer: Self-pay | Admitting: *Deleted

## 2011-03-06 NOTE — Telephone Encounter (Signed)
Please call pt with recent lab results.  He is especially interested in the renal functions.

## 2011-03-06 NOTE — Telephone Encounter (Signed)
Called and discussed

## 2011-03-31 ENCOUNTER — Other Ambulatory Visit: Payer: Self-pay | Admitting: Internal Medicine

## 2011-06-12 ENCOUNTER — Encounter: Payer: Self-pay | Admitting: Physician Assistant

## 2011-06-25 LAB — URINALYSIS, ROUTINE W REFLEX MICROSCOPIC
Bilirubin Urine: NEGATIVE
Nitrite: NEGATIVE
Specific Gravity, Urine: 1.027
pH: 6

## 2011-06-25 LAB — DIFFERENTIAL
Basophils Absolute: 0
Lymphocytes Relative: 9 — ABNORMAL LOW
Monocytes Absolute: 0.9
Monocytes Relative: 6
Neutro Abs: 13.8 — ABNORMAL HIGH

## 2011-06-25 LAB — COMPREHENSIVE METABOLIC PANEL
Albumin: 3.9
BUN: 17
Calcium: 9.3
Creatinine, Ser: 1.35
Total Protein: 7.6

## 2011-06-25 LAB — CBC
HCT: 39.3
MCV: 94.8
Platelets: 244
RDW: 13.9

## 2011-07-03 ENCOUNTER — Ambulatory Visit (INDEPENDENT_AMBULATORY_CARE_PROVIDER_SITE_OTHER): Payer: Medicare Other | Admitting: Internal Medicine

## 2011-07-03 ENCOUNTER — Encounter: Payer: Self-pay | Admitting: Internal Medicine

## 2011-07-03 DIAGNOSIS — I10 Essential (primary) hypertension: Secondary | ICD-10-CM

## 2011-07-03 DIAGNOSIS — K219 Gastro-esophageal reflux disease without esophagitis: Secondary | ICD-10-CM

## 2011-07-03 LAB — BASIC METABOLIC PANEL
BUN: 14
CO2: 25
Chloride: 104
Glucose, Bld: 134 — ABNORMAL HIGH
Potassium: 3.8

## 2011-07-03 LAB — APTT
aPTT: 32
aPTT: 36

## 2011-07-03 LAB — DIFFERENTIAL
Basophils Absolute: 0
Lymphocytes Relative: 26
Monocytes Absolute: 0.8
Neutro Abs: 3.5
Neutrophils Relative %: 57

## 2011-07-03 LAB — URINALYSIS, ROUTINE W REFLEX MICROSCOPIC
Bilirubin Urine: NEGATIVE
Ketones, ur: NEGATIVE
Ketones, ur: NEGATIVE
Nitrite: NEGATIVE
Nitrite: NEGATIVE
Protein, ur: NEGATIVE
Protein, ur: NEGATIVE
Specific Gravity, Urine: 1.011
Urobilinogen, UA: 0.2
pH: 6

## 2011-07-03 LAB — LIPID PANEL
LDL Cholesterol: 90
Triglycerides: 65
VLDL: 13

## 2011-07-03 LAB — COMPREHENSIVE METABOLIC PANEL
AST: 89 — ABNORMAL HIGH
Albumin: 3.4 — ABNORMAL LOW
BUN: 16
CO2: 26
Calcium: 9
Chloride: 108
Creatinine, Ser: 1.38
Creatinine, Ser: 1.26
GFR calc Af Amer: >60
GFR calc non Af Amer: 52 — ABNORMAL LOW
Glucose, Bld: 107 — ABNORMAL HIGH
Total Bilirubin: 0.7
Total Protein: 6.8

## 2011-07-03 LAB — CK TOTAL AND CKMB (NOT AT ARMC)
CK, MB: 1.5
Total CK: 85

## 2011-07-03 LAB — CBC
HCT: 31 — ABNORMAL LOW
MCHC: 33.6
MCV: 93.1
MCV: 94.4
Platelets: 213
Platelets: 282
Platelets: 259
RBC: 4.11 — ABNORMAL LOW
RDW: 13.9
RDW: 14.1
RDW: 13.7

## 2011-07-03 LAB — TROPONIN I: Troponin I: 0.01

## 2011-07-03 LAB — PROTIME-INR: INR: 1.1

## 2011-07-03 LAB — HEMOGLOBIN A1C: Mean Plasma Glucose: 122

## 2011-07-03 MED ORDER — OMEPRAZOLE 40 MG PO CPDR: 40.0000 mg | DELAYED_RELEASE_CAPSULE | Freq: Every day | ORAL | Status: DC

## 2011-07-03 NOTE — Progress Notes (Signed)
  Subjective:    Patient ID: Brett Castaneda, male    DOB: Aug 25, 1941, 70 y.o.   MRN: [id number]  HPI  70 year old patient who has a long history of gastro-esophageal reflux disease. He has been on Pepcid 20 mg daily. He was told by a GI specialist greater than 20 years ago that he should consider surgery. More recently he has had severe reflux symptoms usually after a large meal aggravated by the supine position. He describes burning and excessive gas and belching. He has treated hypertension which has been partially controlled off medications. He does have a history of rheumatoid arthritis.    Review of Systems  Constitutional: Negative for fever, chills, appetite change and fatigue.  HENT: Negative for hearing loss, ear pain, congestion, sore throat, trouble swallowing, neck stiffness, dental problem, voice change and tinnitus.   Eyes: Negative for pain, discharge and visual disturbance.  Respiratory: Negative for cough, chest tightness, wheezing and stridor.   Cardiovascular: Negative for chest pain, palpitations and leg swelling.  Gastrointestinal: Positive for abdominal pain. Negative for nausea, vomiting, diarrhea, constipation, blood in stool and abdominal distention.  Genitourinary: Negative for urgency, hematuria, flank pain, discharge, difficulty urinating and genital sores.  Musculoskeletal: Negative for myalgias, back pain, joint swelling, arthralgias and gait problem.  Skin: Negative for rash.  Neurological: Negative for dizziness, syncope, speech difficulty, weakness, numbness and headaches.  Hematological: Negative for adenopathy. Does not bruise/bleed easily.  Psychiatric/Behavioral: Negative for behavioral problems and dysphoric mood. The patient is not nervous/anxious.        Objective:   Physical Exam  Constitutional: He is oriented to person, place, and time. He appears well-developed.       Repeat blood pressure 130/84  HENT:  Head: Normocephalic.  Right Ear: External  ear normal.  Left Ear: External ear normal.  Eyes: Conjunctivae and EOM are normal.  Neck: Normal range of motion.  Cardiovascular: Normal rate and normal heart sounds.   Pulmonary/Chest: Breath sounds normal.  Abdominal: Soft. Bowel sounds are normal. He exhibits no distension. There is no tenderness. There is no rebound and no guarding.  Musculoskeletal: Normal range of motion. He exhibits no edema and no tenderness.  Neurological: He is alert and oriented to person, place, and time.  Psychiatric: He has a normal mood and affect. His behavior is normal.          Assessment & Plan:   GERD. Aggressive antireflux measures were discussed and information provided. He will be placed on omeprazole 20 mg daily Hypertension currently stable off medication Rheumatoid arthritis. Rheumatology

## 2011-07-03 NOTE — Patient Instructions (Addendum)
Avoids foods high in acid such as tomatoes citrus juices, and spicy foods.  Avoid eating within two hours of lying down or before exercising.  Do not overheat.  Try smaller more frequent meals.  If symptoms persist, elevate the head of her bed 12 inches while sleeping.  Call or return to clinic prn if these symptoms worsen or fail to improve as anticipated. Acid Reflux (GERD) Acid reflux is also called gastroesophageal reflux disease (GERD). Your stomach makes acid to help digest food. Acid reflux happens when acid from your stomach goes into the tube between your mouth and stomach (esophagus). Your stomach is protected from the acid, but this tube is not. When acid gets into the tube, it may cause a burning feeling in the chest (heartburn). Besides heartburn, other health problems can happen if the acid keeps going into the tube. Some causes of acid reflux include:  Being overweight.   Smoking.   Drinking alcohol.   Eating large meals.   Eating meals and then going to bed right away.   Eating certain foods.   Increased stomach acid production.  HOME CARE  Take all medicine as told by your doctor.   You may need to:   Lose weight.   Avoid alcohol.   Quit smoking.   Do not eat big meals. It is better to eat smaller meals throughout the day.   Do not eat a meal and then nap or go to bed.   Sleep with your head higher than your stomach.   Avoid foods that bother you.   You may need more tests, or you may need to see a special doctor.  GET HELP RIGHT AWAY IF:  You have chest pain that is different than before.   You have pain that goes to your arms, jaw, or between your shoulder blades.   You throw up (vomit) blood, dark brown liquid, or your throw up looks like coffee grounds.   You have trouble swallowing.   You have trouble breathing or cannot stop coughing.   You feel dizzy or pass out.   Your skin is cool, wet, and pale.   Your medicine is not helping.  MAKE  SURE YOU:    Understand these instructions.   Will watch your condition.   Will get help right away if you are not doing well or get worse.  Document Released: 02/27/2008 Document Re-Released: 12/05/2009 [facility] Patient Information 2011 [place], [place].

## 2011-08-02 ENCOUNTER — Ambulatory Visit (INDEPENDENT_AMBULATORY_CARE_PROVIDER_SITE_OTHER): Payer: Medicare Other | Admitting: Thoracic Diseases

## 2011-08-02 ENCOUNTER — Encounter: Payer: Self-pay | Admitting: Thoracic Diseases

## 2011-08-02 ENCOUNTER — Other Ambulatory Visit (INDEPENDENT_AMBULATORY_CARE_PROVIDER_SITE_OTHER): Payer: Medicare Other | Admitting: *Deleted

## 2011-08-02 VITALS — BP 150/71 | HR 91 | Resp 20 | Ht 71.0 in | Wt 190.0 lb

## 2011-08-02 DIAGNOSIS — I6529 Occlusion and stenosis of unspecified carotid artery: Secondary | ICD-10-CM

## 2011-08-02 DIAGNOSIS — Z48812 Encounter for surgical aftercare following surgery on the circulatory system: Secondary | ICD-10-CM

## 2011-08-02 NOTE — Progress Notes (Signed)
Established Carotid Patient Date of Surgery: R CEA 08/15/07 Surgeon: Madilyn Fireman, MD  History of Present Illness  Brett Castaneda is a 70 y.o. male who has known carotid stenosis.  Patient has had previous Right CEA.  Patient has Negative history of TIA or stroke symptom.  The patient denies amaurosis fugax or monocular blindness.  The patient  denies facial drooping.  Pt. denies hemiplegia.  The patient denies receptive or expressive aphasia.  Pt. denies weakness; BUE/BLE; Pt. Doe state that he has had pain in both calves behind knees for the past 2 weeks when he was putting a porch in for his daughter. He denies pain at rest or claudication. He states it hurts when he has been in a kneeling position an then gets up and walks. He denies rest or night pain. 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 He has also had Bilat. TKR and is on methotrexate for his Rheumatoid arthritis.   Non-Invasive Vascular Imaging CAROTID DUPLEX 08/02/2011   Right ICA 20 - 39 % stenosis Left ICA 20 - 39 % stenosis  Previous carotid studies demonstrated: RICA 20 - 39 % stenosis, LICA 20 - 39 % stenosis.  These findings are Unchanged from previous exam  Past Medical History  Diagnosis Date  . BASAL CELL CARCINOMA, FACE 03/31/2009  . CEREBROVASCULAR ACCIDENT, HX OF 03/24/2007  . CONSTIPATION 06/22/2008  . DIVERTICULOSIS, COLON 03/24/2007  . GERD 03/24/2007  . HYPERLIPIDEMIA 08/27/2007  . HYPERTENSION 12/14/2008  . Rheumatoid arthritis 03/24/2007  . Blind right eye   . Retinal detachment     hx of  . Stroke     Social History History  Substance Use Topics  . Smoking status: Former Smoker -- 23 years    Types: Cigarettes    Quit date: 09/24/1980  . Smokeless tobacco: Never Used  . Alcohol Use: No    Family History Family History  Problem Relation Age of Onset  . Stroke Mother 64  . Heart disease Father   . Coronary artery disease Sister   . Heart disease  Sister   . Diabetes Sister   . Hypertension Sister     Review of Systems : [x]  Positive   [ ]  Negative  General:[ ]  Weight loss,  [ ]  Weight gain, [ ]  Loss of appetite, [ ]  Fever, [ ]  chills  Neurologic: [ ]  Dizziness, [ ]  Blackouts, [ ]  Headaches, [ ]  Seizure [ ]  Stroke, [ ]  "Mini stroke", [ ]  Slurred speech, [ ]  Temporary blindness;  [ ] weakness,  Ear/Nose/Throat: [ ]  Change in hearing, [ ]  Nose bleeds, [ ]  Hoarseness  Vascular:[x ] Pain in legs with walking secondary to arthritis, [ ]  Pain in feet while lying flat , [ ]   Non-healing ulcer, [ ]  Blood clot in vein,    Pulmonary: [ ]  Home oxygen, [ ]   Productive cough, [ ]  Bronchitis, [ ]  Coughing up blood,  [ ]  Asthma, [ ]  Wheezing  Musculoskeletal:  [ ]  Arthritis, [x ] Joint pain, [ ]  low back pain  Cardiac: [ ]  Chest pain, [ ]  Shortness of breath when lying flat, [ ]  Shortness of breath with exertion, [ ]  Palpitations, [ ]  Heart murmur, [ ]   Atrial fibrillation  Hematologic:[ ]  Easy Bruising, [ ]  Anemia; [ ]  Hepatitis  Psychiatric: [ ]   Depression, [ ]  Anxiety   Gastrointestinal: [ ]  Black stool, [ ]  Blood in stool, [ ]  Peptic ulcer disease,  [ ]   Gastroesophageal Reflux, [ ]  Trouble swallowing, [ ]  Diarrhea, [ ]  Constipation  Urinary: [ ]  chronic Kidney disease, [ ]  on HD, [ ]  Burning with urination, [ ]  Frequent urination, [ ]  Difficulty urinating;   Skin: [ ]  Rashes, [ ]  Wounds   Allergies  Allergen Reactions  . Dopamine   . Morphine And Related   . Vicodin (Hydrocodone-Acetaminophen)     GI Upset    Current Outpatient Prescriptions  Medication Sig Dispense Refill  . adalimumab (HUMIRA) 40 MG/0.8ML injection Inject 40 mg into the skin once a week.        Marland Kitchen aspirin 325 MG tablet Take 325 mg by mouth daily.        . famotidine (PEPCID) 20 MG tablet Take 20 mg by mouth daily.        . FOLBIC 2.5-25-2 MG TABS TAKE 1 TABLET BY MOUTH ONCE DAILY  90 tablet  1  . latanoprost (XALATAN) 0.005 % ophthalmic solution 1 drop at  bedtime.        . methotrexate (RHEUMATREX) 2.5 MG tablet Take 2.5 mg by mouth. Caution:Chemotherapy. Protect from light. As directed       . omeprazole (PRILOSEC) 40 MG capsule Take 1 capsule (40 mg total) by mouth daily.  90 capsule  6  . oxyCODONE-acetaminophen (PERCOCET) 5-325 MG per tablet Take 1 tablet by mouth every 4 (four) hours as needed.  30 tablet  0  . polyethylene glycol (GLYCOLAX/MIRALAX) powder Take 17 g by mouth 2 (two) times daily.        . pravastatin (PRAVACHOL) 40 MG tablet Take 1 tablet (40 mg total) by mouth every evening.  90 tablet  4  . timolol (TIMOPTIC) 0.5 % ophthalmic solution 1 drop 2 (two) times daily.          Physical Examination  Filed Vitals:   08/02/11 1429  BP: 150/71  Pulse: 91  Resp: 20    General: WDWN male in NAD GAIT: normal Eyes: PERRLA Pulmonary:  CTAB,   Cardiac: regular Rhythm  Vascular:     RIGHT   LEF CAROTID BRUITS Negative Negative  RADIAL Palpable palpable     BLE warm and well perfused with 2-3+ palpable DP and PT botH calves soft, no swelling Gastrointestinal: soft, nontender, BS WNL, no r/g,    Musculoskeletal: good and equal strength in BUE/BLE  Neurologic: A&O X 3; Appropriate Affect ; SENSATION ;normal; MOTOR FUNCTION: normal 5/5 strength in all tested muscle groups Speech is normal  Assessment: Brett Castaneda is a 70 y.o. male who presents with: Asymptomatic 20 - 39 % Bilateral ICA  stenosis The  ICA stenosis is  Unchanged from previous exam. Right subclavian artery velocities of 367. Asymptomatic for RUE claudication. Arthritis in joints, s/p Bilat. TKR Doubt DVT as pain in knees is related to going from a squatting to a standing position Doubt PAD as pt has bounding peripheral pulses  Plan: Follow-up in 1 years with Carotid Duplex scan and letter  Pt was given information regarding stroke symptoms and prevention Pt to contact PMD re: meds vs arthritis causing bilat, knee calf pain. Clinic MD: *C.Fields,  MD

## 2011-08-08 NOTE — Procedures (Unsigned)
CAROTID DUPLEX EXAM  INDICATION:  Follow up right CEA.  HISTORY: Diabetes:  No. Cardiac:  No. Hypertension:  No. Smoking:  No. Previous Surgery:  Right CEA, 08/15/2007. CV History:  CVA. Amaurosis Fugax No, Paresthesias No, Hemiparesis No.                                      RIGHT             LEFT Brachial systolic pressure:         130               120 Brachial Doppler waveforms:         WNL               WNL Vertebral direction of flow:        Abnormal          Antegrade DUPLEX VELOCITIES (cm/sec) CCA peak systolic                   91                90 ECA peak systolic                   85                183 ICA peak systolic                   45                74 ICA end diastolic                   8                 20 PLAQUE MORPHOLOGY:                                    Heterogenous PLAQUE AMOUNT:                      Moderate          Minimal PLAQUE LOCATION:                    Subclavian        ICA/ECA  IMPRESSION: 1. Widely patent right carotid endarterectomy without evidence of     restenosis and hyperplasia. 2. 1% to 39% left internal carotid artery plaquing. 3. Right vertebral artery is bidirectional.  Right subclavian artery     velocities are 367 cm/s with distal post-stenotic turbulence     observed.  However , there is no significant blood pressure     gradient in the brachial arteries. 4. Left subclavian and vertebral arteries are within normal limits. 5. Incidental finding:  There are multiple nodes in the left thyroid.  ___________________________________________ Larina Earthly, M.D.  LT/MEDQ  D:  08/02/2011  T:  08/02/2011  Job:  [id number]

## 2011-08-24 ENCOUNTER — Encounter: Payer: Self-pay | Admitting: Internal Medicine

## 2011-08-24 ENCOUNTER — Ambulatory Visit (INDEPENDENT_AMBULATORY_CARE_PROVIDER_SITE_OTHER): Payer: Medicare Other | Admitting: Internal Medicine

## 2011-08-24 DIAGNOSIS — E785 Hyperlipidemia, unspecified: Secondary | ICD-10-CM

## 2011-08-24 DIAGNOSIS — K219 Gastro-esophageal reflux disease without esophagitis: Secondary | ICD-10-CM

## 2011-08-24 DIAGNOSIS — I10 Essential (primary) hypertension: Secondary | ICD-10-CM

## 2011-08-24 MED ORDER — OXYCODONE-ACETAMINOPHEN 5-325 MG PO TABS: 1.0000 | ORAL_TABLET | ORAL | Status: DC | PRN

## 2011-08-24 NOTE — Progress Notes (Signed)
  Subjective:    Patient ID: Brett Castaneda, male    DOB: [birth year], 70 y.o.   MRN: [id number]  HPI  70 year old patient who is seen today for followup. He has a history of rheumatoid arthritis hypertension and dyslipidemia. The past week he has had increasing lumbar pain. He is seen for his six-month followup. Medical regimen includes oxycodone methotrexate as well as Humira. Denies any systemic complaints such as fever or weight loss In general doing reasonably well.    Review of Systems  Musculoskeletal: Positive for back pain and arthralgias.       Objective:   Physical Exam  Constitutional: He is oriented to person, place, and time. He appears well-developed.  HENT:  Head: Normocephalic.  Right Ear: External ear normal.  Left Ear: External ear normal.  Eyes: Conjunctivae and EOM are normal.  Neck: Normal range of motion.  Cardiovascular: Normal rate and normal heart sounds.   Pulmonary/Chest: Breath sounds normal.  Abdominal: Bowel sounds are normal.  Musculoskeletal: Normal range of motion. He exhibits no edema and no tenderness.       Straight leg test resulted in pain in the posterior thigh area No focal tenderness  Neurological: He is alert and oriented to person, place, and time.  Psychiatric: He has a normal mood and affect. His behavior is normal.          Assessment & Plan:   Low back pain Rheumatoid arthritis Hypertension stable Dyslipidemia we'll continue Pravachol.  Recheck 6 months for his annual exam We'll continue his analgesics rest. Will call if unimproved. Will followup with rheumatology

## 2011-08-24 NOTE — Patient Instructions (Signed)
Most patients with low back pain will improve with time over the next two to 4 weeks.  Keep active but avoid any activities that cause pain.  Apply moist heat to the low back area several times daily.  Call or return to clinic prn if these symptoms worsen or fail to improve as anticipated.

## 2011-10-10 ENCOUNTER — Other Ambulatory Visit: Payer: Self-pay

## 2011-10-10 ENCOUNTER — Emergency Department (HOSPITAL_COMMUNITY)
Admission: EM | Admit: 2011-10-10 | Discharge: 2011-10-10 | Disposition: A | Discharge status: Home / Self Care | Payer: Medicare Other | Attending: Emergency Medicine | Admitting: Emergency Medicine

## 2011-10-10 ENCOUNTER — Emergency Department (HOSPITAL_COMMUNITY): Payer: Medicare Other

## 2011-10-10 ENCOUNTER — Encounter (HOSPITAL_COMMUNITY): Payer: Self-pay | Admitting: Emergency Medicine

## 2011-10-10 DIAGNOSIS — I10 Essential (primary) hypertension: Secondary | ICD-10-CM | POA: Insufficient documentation

## 2011-10-10 DIAGNOSIS — M069 Rheumatoid arthritis, unspecified: Secondary | ICD-10-CM | POA: Insufficient documentation

## 2011-10-10 DIAGNOSIS — Z8673 Personal history of transient ischemic attack (TIA), and cerebral infarction without residual deficits: Secondary | ICD-10-CM | POA: Insufficient documentation

## 2011-10-10 DIAGNOSIS — Z7982 Long term (current) use of aspirin: Secondary | ICD-10-CM | POA: Insufficient documentation

## 2011-10-10 DIAGNOSIS — K219 Gastro-esophageal reflux disease without esophagitis: Secondary | ICD-10-CM | POA: Insufficient documentation

## 2011-10-10 DIAGNOSIS — E785 Hyperlipidemia, unspecified: Secondary | ICD-10-CM | POA: Insufficient documentation

## 2011-10-10 DIAGNOSIS — H5010 Unspecified exotropia: Secondary | ICD-10-CM | POA: Insufficient documentation

## 2011-10-10 DIAGNOSIS — R55 Syncope and collapse: Secondary | ICD-10-CM | POA: Insufficient documentation

## 2011-10-10 DIAGNOSIS — H5440 Blindness, one eye, unspecified eye: Secondary | ICD-10-CM | POA: Insufficient documentation

## 2011-10-10 DIAGNOSIS — R404 Transient alteration of awareness: Secondary | ICD-10-CM | POA: Insufficient documentation

## 2011-10-10 DIAGNOSIS — R42 Dizziness and giddiness: Secondary | ICD-10-CM

## 2011-10-10 DIAGNOSIS — R112 Nausea with vomiting, unspecified: Secondary | ICD-10-CM | POA: Insufficient documentation

## 2011-10-10 DIAGNOSIS — Z79899 Other long term (current) drug therapy: Secondary | ICD-10-CM | POA: Insufficient documentation

## 2011-10-10 LAB — DIFFERENTIAL
Eosinophils Absolute: 0.2 10*3/uL (ref 0.0–0.7)
Lymphs Abs: 1.2 10*3/uL (ref 0.7–4.0)
Neutro Abs: 7.3 10*3/uL (ref 1.7–7.7)
Neutrophils Relative %: 80 % — ABNORMAL HIGH (ref 43–77)

## 2011-10-10 LAB — CBC
MCH: 31.5 pg (ref 26.0–34.0)
Platelets: 236 10*3/uL (ref 150–400)
RBC: 4.03 MIL/uL — ABNORMAL LOW (ref 4.22–5.81)
WBC: 9.2 10*3/uL (ref 4.0–10.5)

## 2011-10-10 LAB — BASIC METABOLIC PANEL
Chloride: 103 mEq/L (ref 96–112)
GFR calc Af Amer: >90 mL/min (ref >90)
GFR calc non Af Amer: 84 mL/min — ABNORMAL LOW (ref >90)
Glucose, Bld: 128 mg/dL — ABNORMAL HIGH (ref 70–99)
Potassium: 4.1 mEq/L (ref 3.5–5.1)
Sodium: 136 mEq/L (ref 135–145)

## 2011-10-10 LAB — URINALYSIS, ROUTINE W REFLEX MICROSCOPIC
Hgb urine dipstick: NEGATIVE
Nitrite: NEGATIVE
Specific Gravity, Urine: 1.015 (ref 1.005–1.030)
Urobilinogen, UA: 1 mg/dL (ref 0.0–1.0)

## 2011-10-10 LAB — TROPONIN I: Troponin I: <0.3 ng/mL (ref <0.30)

## 2011-10-10 NOTE — ED Notes (Signed)
Family at bedside. 

## 2011-10-10 NOTE — ED Provider Notes (Signed)
I saw and evaluated the patient, reviewed the resident's note and I agree with the findings and plan.  Patient's symptoms started when patient was sitting down he became dizzy. Patient walked around more dizzy. But when he lays flat history of similar symptoms associated with vertigo she had 25 years ago. He denied any chest pain, severe headache, vomiting. He feels back to his normal self right now. We'll check MRI to rule out CVA. Suspect the patient will be about to be discharged.  Toy Baker, MD 10/10/11 1640

## 2011-10-10 NOTE — ED Notes (Addendum)
Pt states he had n/ but denies vomiting until ems arrived.  Has not vomitted since and nausea has gone away.

## 2011-10-10 NOTE — ED Provider Notes (Signed)
History     CSN: [id number]  Arrival date & time 10/10/11  1430   First MD Initiated Contact with Patient 10/10/11 1522      Chief Complaint  Patient presents with  . Loss of Consciousness    (Consider location/radiation/quality/duration/timing/severity/associated sxs/prior treatment) HPI Comments: Patient had near syncopal episode while sitting on the couch. He been sitting I./15 minutes. He denied chest pain, shortness of breath, nausea/vomiting, weakness/numbness leading up to the event. States he did not lose consciousness however came extremely close. Had episode of nausea with vomiting once EMS arrived upon him on the stretcher. He did not fall as he is sitting down on the couch when this happened.  Patient is a 71 y.o. male presenting with syncope. The history is provided by the patient. No language interpreter was used.  Loss of Consciousness This is a new problem. The current episode started today. Episode frequency: Once. The problem has been resolved. Associated symptoms include nausea (when EMS arrived) and vomiting (once). Pertinent negatives include no abdominal pain, chest pain, chills, coughing, diaphoresis, fever, headaches, numbness, urinary symptoms, vertigo, visual change or weakness. The symptoms are aggravated by nothing. He has tried nothing for the symptoms. The treatment provided no relief.    Past Medical History  Diagnosis Date  . BASAL CELL CARCINOMA, FACE 03/31/2009  . CEREBROVASCULAR ACCIDENT, HX OF 03/24/2007  . CONSTIPATION 06/22/2008  . DIVERTICULOSIS, COLON 03/24/2007  . GERD 03/24/2007  . HYPERLIPIDEMIA 08/27/2007  . HYPERTENSION 12/14/2008  . Rheumatoid arthritis 03/24/2007  . Blind right eye   . Retinal detachment     hx of  . Stroke     Past Surgical History  Procedure Date  . Total knee arthroplasty     bital  . Eye surgery     retinal detachment  . Carpal tunnel release   . Carotid endarterectomy   . Cataract extraction 1980    removed  cataract in left eye. Currently blind in right eye  . Cholecystectomy 2001    Gall Bladder  . Retinal detachment surgery 2004    Family History  Problem Relation Age of Onset  . Stroke Mother 85  . Heart disease Father   . Coronary artery disease Sister   . Heart disease Sister   . Diabetes Sister   . Hypertension Sister     History  Substance Use Topics  . Smoking status: Former Smoker -- 23 years    Types: Cigarettes    Quit date: 09/24/1980  . Smokeless tobacco: Never Used  . Alcohol Use: No      Review of Systems  Constitutional: Negative for fever, chills and diaphoresis.  Respiratory: Negative for cough.   Cardiovascular: Positive for syncope. Negative for chest pain.  Gastrointestinal: Positive for nausea (when EMS arrived) and vomiting (once). Negative for abdominal pain.  Neurological: Negative for vertigo, weakness, numbness and headaches.  All other systems reviewed and are negative.    Allergies  Dopamine; Morphine and related; and Vicodin  Home Medications   Current Outpatient Rx  Name Route Sig Dispense Refill  . ADALIMUMAB 40 MG/0.8ML [place] KIT Subcutaneous Inject 40 mg into the skin once a week.      . ASPIRIN 325 MG PO TABS Oral Take 325 mg by mouth daily.      . FOLBIC 2.5-25-2 MG PO TABS  TAKE 1 TABLET BY MOUTH ONCE DAILY 90 tablet 1  . LATANOPROST 0.005 % OP SOLN  1 drop at bedtime.      Marland Kitchen  METHOTREXATE 2.5 MG PO TABS Oral Take 2.5 mg by mouth. Caution:Chemotherapy. Protect from light. As directed     . OMEPRAZOLE 40 MG PO CPDR Oral Take 1 capsule (40 mg total) by mouth daily. 90 capsule 6  . OXYCODONE-ACETAMINOPHEN 5-325 MG PO TABS Oral Take 1 tablet by mouth every 4 (four) hours as needed. 30 tablet 0  . POLYETHYLENE GLYCOL 3350 PO POWD Oral Take 17 g by mouth 2 (two) times daily.      Marland Kitchen PRAVASTATIN SODIUM 40 MG PO TABS Oral Take 1 tablet (40 mg total) by mouth every evening. 90 tablet 4  . TIMOLOL MALEATE 0.5 % OP SOLN  1 drop 2 (two) times  daily.        BP 147/80  Pulse 70  Temp(Src) 98.7 F (37.1 C) (Oral)  Resp 17  SpO2 98%  Physical Exam  Constitutional: He is oriented to person, place, and time. He appears well-developed and well-nourished. No distress.  HENT:  Head: Normocephalic and atraumatic.  Mouth/Throat: No oropharyngeal exudate.  Eyes: EOM are normal. Pupils are equal, round, and reactive to light.       R eye with mild exotropia as baseline. 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 Neck supple.  Cardiovascular: Normal rate and regular rhythm.  Exam reveals no friction rub.   No murmur heard. Pulmonary/Chest: Effort normal and breath sounds normal. No respiratory distress. He has no wheezes. He has no rales.  Abdominal: He exhibits no distension. There is no tenderness. There is no rebound.  Musculoskeletal: Normal range of motion. He exhibits no edema.  Neurological: He is alert and oriented to person, place, and time. He has normal reflexes. He displays normal reflexes. No cranial nerve deficit. He exhibits normal muscle tone. Coordination normal.  Skin: Skin is warm and dry. He is not diaphoretic.    ED Course  Procedures (including critical care time)  Labs Reviewed  CBC - Abnormal; Notable for the following:    RBC 4.03 (*)    Hemoglobin 12.7 (*)    HCT 38.0 (*)    All other components within normal limits  DIFFERENTIAL - Abnormal; Notable for the following:    Neutrophils Relative 80 (*)    All other components within normal limits  BASIC METABOLIC PANEL - Abnormal; Notable for the following:    Glucose, Bld 128 (*)    GFR calc non Af Amer 84 (*)    All other components within normal limits  URINALYSIS, ROUTINE W REFLEX MICROSCOPIC  TROPONIN I   Ct Head Wo Contrast  10/10/2011  *RADIOLOGY REPORT*  Clinical Data: Loss of consciousness.  Dizziness.  CT HEAD WITHOUT CONTRAST  Technique:  Contiguous axial images were obtained from the base  of the skull through the vertex without contrast.  Comparison: MRI from 08/17/2007.  CT from 08/17/2007.  Findings: There is no evidence for acute hemorrhage, hydrocephalus, mass lesion, or abnormal extra-axial fluid collection.  No definite CT evidence for acute infarction.  Diffuse loss of parenchymal volume is consistent with atrophy.  Old right frontal infarct noted.The visualized paranasal sinuses and mastoid air cells are clear.  IMPRESSION: Stable.  Old right frontal infarct.  No new or acute interval abnormality.  Original Report Authenticated By: ERIC A. MANSELL, M.D.   [name]  10/10/2011  *RADIOLOGY REPORT*  Clinical Data: Dizziness.  Headache and vomiting.  Rule out CVA.  MRI HEAD WITHOUT CONTRAST  Technique:  Multiplanar, multiecho pulse sequences of the brain and surrounding structures were obtained according to standard protocol without intravenous contrast.  Comparison: CT 10/10/2011, MRI 08/17/2007  Findings: Negative for acute infarct.  Chronic infarct right frontal lobe.  Chronic ischemia in the white matter bilaterally.  Chronic infarcts in the right thalamus and right putamen.  Brainstem is intact.  Negative for hemorrhage or mass lesion.  Chronic injury to the right eye with atrophy.  Prior scleral buckle left globe. 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  This measures 12 mm.  IMPRESSION: Chronic ischemic change.  No acute abnormality.  Original Report Authenticated By: Camelia Phenes, M.D.     1. Near syncope       MDM  71 year old male presents with near-syncopal that. Unprovoked, as he was sitting on the couch and had been for 15 minutes this happened. No associated chest pain, nausea/vomiting, shortness of breath. Upon EMS arrival he did some nausea and one episode of vomiting when they moved into the stretcher, however has resolved now. History of arthritis. No cardiac history. No history of diabetes. History of a  stroke while he had a carotid endarterectomy. Vitals stable. Patient's neuro intact. Exam is benign.  [place] near syncopal episode, concern for possible cardiac versus neurologic etiology. We'll check head CT, troponin, basic labs. EKG is nonischemic.  MRI ordered to evaluate for possible basilar/cerebellar stroke. MRI negative. Symptoms c/w vertigo. Patient discharged home in stable condition, instructed to f/u with PCP in 2-3 days.  Elwin Mocha, MD 10/10/11 [phone]

## 2011-10-10 NOTE — ED Notes (Signed)
Pt home eatting lunch.  Pt sat down and passed out.  Crawled to call EMS 4 zofran, 18g rt hand.  12lead unreamrk.  99% on RA.  History of stroke and vertigo

## 2011-10-11 MED FILL — Ondansetron HCl Inj 4 MG/2ML (2 MG/ML): INTRAMUSCULAR | Qty: 2 | Status: AC

## 2011-10-11 NOTE — ED Provider Notes (Signed)
I saw and evaluated the patient, reviewed the resident's note and I agree with the findings and plan.  Toy Baker, MD 10/11/11 1525

## 2011-11-12 ENCOUNTER — Encounter: Payer: Self-pay | Admitting: Internal Medicine

## 2011-11-12 ENCOUNTER — Ambulatory Visit (INDEPENDENT_AMBULATORY_CARE_PROVIDER_SITE_OTHER): Payer: Medicare Other | Admitting: Internal Medicine

## 2011-11-12 DIAGNOSIS — I10 Essential (primary) hypertension: Secondary | ICD-10-CM

## 2011-11-12 DIAGNOSIS — J069 Acute upper respiratory infection, unspecified: Secondary | ICD-10-CM

## 2011-11-12 MED ORDER — BENZONATATE 200 MG PO CAPS: 200.0000 mg | ORAL_CAPSULE | Freq: Two times a day (BID) | ORAL | Status: DC | PRN

## 2011-11-12 NOTE — Progress Notes (Signed)
  Subjective:    Patient ID: Brett Castaneda, male    DOB: [birth year], 71 y.o.   MRN: [id number]  HPI  71 year old patient who presents with a three-day history of head and chest congestion and productive cough. He has had no fever. He was evaluated in the ED one month ago for vertigo. This has not recurred. He does have a hydrocodone allergy. Denies any chest pain or shortness of breath. He has a history of cerebrovascular disease and denies any focal neurological symptoms    Review of Systems  Constitutional: Negative for fever, chills, appetite change and fatigue.  HENT: Positive for congestion, rhinorrhea and postnasal drip. Negative for hearing loss, ear pain, sore throat, trouble swallowing, neck stiffness, dental problem, voice change and tinnitus.   Eyes: Negative for pain, discharge and visual disturbance.  Respiratory: Positive for cough. Negative for chest tightness, wheezing and stridor.   Cardiovascular: Negative for chest pain, palpitations and leg swelling.  Gastrointestinal: Negative for nausea, vomiting, abdominal pain, diarrhea, constipation, blood in stool and abdominal distention.  Genitourinary: Negative for urgency, hematuria, flank pain, discharge, difficulty urinating and genital sores.  Musculoskeletal: Negative for myalgias, back pain, joint swelling, arthralgias and gait problem.  Skin: Negative for rash.  Neurological: Negative for dizziness, syncope, speech difficulty, weakness, numbness and headaches.  Hematological: Negative for adenopathy. Does not bruise/bleed easily.  Psychiatric/Behavioral: Negative for behavioral problems and dysphoric mood. The patient is not nervous/anxious.        Objective:   Physical Exam  Constitutional: He is oriented to person, place, and time. He appears well-developed.       Repeat blood pressure 124/74  HENT:  Head: Normocephalic.  Right Ear: External ear normal.  Left Ear: External ear normal.  Eyes: Conjunctivae and EOM are  normal.       Exotropia  Neck: Normal range of motion.  Cardiovascular: Normal rate and normal heart sounds.   Pulmonary/Chest: Breath sounds normal.  Abdominal: Bowel sounds are normal.  Musculoskeletal: Normal range of motion. He exhibits no edema and no tenderness.  Neurological: He is alert and oriented to person, place, and time.  Psychiatric: He has a normal mood and affect. His behavior is normal.          Assessment & Plan:  Viral URI. Will treat symptomatically hypertension stable History of cerebrovascular disease

## 2011-11-12 NOTE — Patient Instructions (Signed)
Get plenty of rest, Drink lots of  clear liquids, and use Tylenol or ibuprofen for fever and discomfort.    Call or return to clinic prn if these symptoms worsen or fail to improve as anticipated.  

## 2011-11-20 ENCOUNTER — Encounter: Payer: Self-pay | Admitting: Internal Medicine

## 2011-11-20 ENCOUNTER — Ambulatory Visit (INDEPENDENT_AMBULATORY_CARE_PROVIDER_SITE_OTHER): Payer: Medicare Other | Admitting: Internal Medicine

## 2011-11-20 VITALS — BP 140/90 | Temp 99.5°F | Wt 184.0 lb

## 2011-11-20 DIAGNOSIS — I10 Essential (primary) hypertension: Secondary | ICD-10-CM

## 2011-11-20 MED ORDER — BENZONATATE 200 MG PO CAPS: 200.0000 mg | ORAL_CAPSULE | Freq: Two times a day (BID) | ORAL | Status: AC | PRN

## 2011-11-20 MED ORDER — AZITHROMYCIN 250 MG PO TABS: ORAL_TABLET | ORAL | Status: AC

## 2011-11-20 NOTE — Patient Instructions (Signed)
Take your antibiotic as prescribed until ALL of it is gone, but stop if you develop a rash, swelling, or any side effects of the medication.  Contact our office as soon as possible if  there are side effects of the medication.  Call or return to clinic prn if these symptoms worsen or fail to improve as anticipated.  

## 2011-11-20 NOTE — Progress Notes (Signed)
  Subjective:    Patient ID: Brett Castaneda, male    DOB: April 08, 1941, 71 y.o.   MRN: [id number]  HPI  71 year old patient who was seen 8 days ago and treated symptomatically for a viral URI more recently has developed worsening fever cough and general sense of unwellness. Fever has been as high as 101. Still no significant sputum production. He has been using Tessalon and Mucinex DM. He does have a hydrocodone allergy denies any chest pain or shortness of breath although he does note more chest congestion    Review of Systems  Constitutional: Positive for fever, activity change, appetite change and fatigue. Negative for chills.  HENT: Negative for hearing loss, ear pain, congestion, sore throat, trouble swallowing, neck stiffness, dental problem, voice change and tinnitus.   Eyes: Negative for pain, discharge and visual disturbance.  Respiratory: Positive for cough. Negative for chest tightness, wheezing and stridor.   Cardiovascular: Negative for chest pain, palpitations and leg swelling.  Gastrointestinal: Negative for nausea, vomiting, abdominal pain, diarrhea, constipation, blood in stool and abdominal distention.  Genitourinary: Negative for urgency, hematuria, flank pain, discharge, difficulty urinating and genital sores.  Musculoskeletal: Negative for myalgias, back pain, joint swelling, arthralgias and gait problem.  Skin: Negative for rash.  Neurological: Negative for dizziness, syncope, speech difficulty, weakness, numbness and headaches.  Hematological: Negative for adenopathy. Does not bruise/bleed easily.  Psychiatric/Behavioral: Negative for behavioral problems and dysphoric mood. The patient is not nervous/anxious.        Objective:   Physical Exam  Constitutional: He is oriented to person, place, and time. He appears well-developed.       Appears unwell but in no acute distress. Temperature 99.5  HENT:  Head: Normocephalic.  Right Ear: External ear normal.  Left Ear:  External ear normal.  Eyes: Conjunctivae and EOM are normal.  Neck: Normal range of motion.  Cardiovascular: Normal rate and normal heart sounds.   Pulmonary/Chest: Effort normal. He has rales.       Rales were noted involving both lung bases the right greater than the left  O2 saturation 97% Pulse rate 90  Abdominal: Bowel sounds are normal.  Musculoskeletal: Normal range of motion. He exhibits no edema and no tenderness.  Neurological: He is alert and oriented to person, place, and time.  Psychiatric: He has a normal mood and affect. His behavior is normal.          Assessment & Plan:   Recent viral URI- now suspected community-acquired pneumonia. We'll treat with azithromycin. We'll continue symptomatic treatment. We'll call if unimproved and check a chest x-ray if there is not prompt clinical improvement

## 2011-11-21 ENCOUNTER — Telehealth: Payer: Self-pay | Admitting: Internal Medicine

## 2011-11-21 MED ORDER — TRAMADOL HCL 50 MG PO TABS: 50.0000 mg | ORAL_TABLET | Freq: Four times a day (QID) | ORAL | Status: AC | PRN

## 2011-11-21 NOTE — Telephone Encounter (Signed)
Pt was dx with pneumonia yesterday. Pt is having pain all over. [facility] [phone]

## 2011-11-21 NOTE — Telephone Encounter (Signed)
done

## 2011-11-21 NOTE — Telephone Encounter (Signed)
Please advise 

## 2011-11-21 NOTE — Telephone Encounter (Signed)
Tramadol 50 mg #50  Two  initially then 1 every 6 hours as needed for pain

## 2011-11-30 ENCOUNTER — Telehealth: Payer: Self-pay | Admitting: *Deleted

## 2011-11-30 NOTE — Telephone Encounter (Signed)
Observe over the weekend. Call next week if any clinical worsening or fever. Continue Mucinex. No further antibiotics indicated

## 2011-11-30 NOTE — Telephone Encounter (Signed)
pts wife aware.

## 2011-11-30 NOTE — Telephone Encounter (Signed)
Pt wife is aware nurse will call back

## 2011-11-30 NOTE — Telephone Encounter (Signed)
Pt was dx with pneumonia and has finished his antibiotic.  He is taking mucinex and still has a productive cough clear sputum with a yellow tint, no fever.  He is also having frequent urination. [facility] [employer]

## 2011-12-20 ENCOUNTER — Other Ambulatory Visit: Payer: Self-pay | Admitting: Dermatology

## 2012-03-07 ENCOUNTER — Ambulatory Visit (INDEPENDENT_AMBULATORY_CARE_PROVIDER_SITE_OTHER)
Admission: RE | Admit: 2012-03-07 | Discharge: 2012-03-07 | Disposition: A | Discharge status: Home / Self Care | Payer: Medicare Other | Source: Ambulatory Visit | Attending: Internal Medicine | Admitting: Internal Medicine

## 2012-03-07 ENCOUNTER — Ambulatory Visit (INDEPENDENT_AMBULATORY_CARE_PROVIDER_SITE_OTHER): Payer: Medicare Other | Admitting: Internal Medicine

## 2012-03-07 ENCOUNTER — Telehealth: Payer: Self-pay

## 2012-03-07 ENCOUNTER — Encounter: Payer: Self-pay | Admitting: Internal Medicine

## 2012-03-07 VITALS — BP 140/80 | Temp 100.3°F | Wt 182.0 lb

## 2012-03-07 DIAGNOSIS — R509 Fever, unspecified: Secondary | ICD-10-CM

## 2012-03-07 DIAGNOSIS — M069 Rheumatoid arthritis, unspecified: Secondary | ICD-10-CM

## 2012-03-07 DIAGNOSIS — I10 Essential (primary) hypertension: Secondary | ICD-10-CM

## 2012-03-07 LAB — POCT URINALYSIS DIPSTICK
Bilirubin, UA: NEGATIVE
Glucose, UA: NEGATIVE
Nitrite, UA: NEGATIVE
Spec Grav, UA: 1.025
Urobilinogen, UA: 0.2

## 2012-03-07 LAB — COMPREHENSIVE METABOLIC PANEL
ALT: 22 U/L (ref 0–53)
AST: 28 U/L (ref 0–37)
Alkaline Phosphatase: 79 U/L (ref 39–117)
Sodium: 132 mEq/L — ABNORMAL LOW (ref 135–145)
Total Bilirubin: 1.6 mg/dL — ABNORMAL HIGH (ref 0.3–1.2)
Total Protein: 7.6 g/dL (ref 6.0–8.3)

## 2012-03-07 LAB — CBC WITH DIFFERENTIAL/PLATELET
Basophils Relative: 0 % (ref 0.0–3.0)
Eosinophils Absolute: 0 10*3/uL (ref 0.0–0.7)
HCT: 40 % (ref 39.0–52.0)
Lymphocytes Relative: 5.9 % — ABNORMAL LOW (ref 12.0–46.0)
MCV: 98.1 fl (ref 78.0–100.0)
Monocytes Relative: 2.9 % — ABNORMAL LOW (ref 3.0–12.0)
Neutro Abs: 18 10*3/uL — ABNORMAL HIGH (ref 1.4–7.7)
Neutrophils Relative %: 91.2 % — ABNORMAL HIGH (ref 43.0–77.0)

## 2012-03-07 MED ORDER — AZITHROMYCIN 250 MG PO TABS: ORAL_TABLET | ORAL | Status: AC

## 2012-03-07 MED ORDER — CEFTRIAXONE SODIUM 1 G IJ SOLR
1.0000 g | Freq: Once | INTRAMUSCULAR | Status: AC
  Administered 2012-03-07: 1 g via INTRAMUSCULAR

## 2012-03-07 NOTE — Addendum Note (Signed)
Addended by: Rita Ohara R on: 03/07/2012 01:10 PM   Modules accepted: Orders

## 2012-03-07 NOTE — Patient Instructions (Addendum)
Drink as much fluid as you  can tolerate over the next few days  Take [phone]  mg of Tylenol every 6 hours as needed for pain relief or fever.  Avoid taking more than 3000 mg in a 24-hour period (  This may cause liver damage).  ED evaluation over the weekend if any clinical worsening  Take your antibiotic as prescribed until ALL of it is gone, but stop if you develop a rash, swelling, or any side effects of the medication.  Contact our office as soon as possible if  there are side effects of the medication.  Return office visit in 3- 4 days

## 2012-03-07 NOTE — Telephone Encounter (Signed)
Error

## 2012-03-07 NOTE — Progress Notes (Signed)
  Subjective:    Patient ID: Brett Castaneda, male    DOB: [birth year], 71 y.o.   MRN: [id number]  HPI  71 year old patient with  history of RA who is on immunosuppressant therapy. Yesterday he began feeling unwell and later in the afternoon began having fever chills and progressive weakness. He has had perhaps a minimal nonproductive cough. Through the night he had some urinary frequency but no significant dysuria. History of hypertension his lipidemia. Fever yesterday was as high as 102. He has had poor appetite. Today he is still weak but improved fever with Tylenol.    Review of Systems  Constitutional: Positive for fever, chills, diaphoresis, activity change, appetite change and fatigue.  HENT: Negative for hearing loss, ear pain, congestion, sore throat, trouble swallowing, neck stiffness, dental problem, voice change and tinnitus.   Eyes: Negative for pain, discharge and visual disturbance.  Respiratory: Positive for cough. Negative for chest tightness, wheezing and stridor.   Cardiovascular: Negative for chest pain, palpitations and leg swelling.  Gastrointestinal: Negative for nausea, vomiting, abdominal pain, diarrhea, constipation, blood in stool and abdominal distention.  Genitourinary: Negative for urgency, hematuria, flank pain, discharge, difficulty urinating and genital sores.  Musculoskeletal: Negative for myalgias, back pain, joint swelling, arthralgias and gait problem.  Skin: Negative for rash.  Neurological: Positive for weakness. Negative for dizziness, syncope, speech difficulty, numbness and headaches.  Hematological: Negative for adenopathy. Does not bruise/bleed easily.  Psychiatric/Behavioral: Negative for behavioral problems and dysphoric mood. The patient is not nervous/anxious.        Objective:   Physical Exam  Constitutional: He is oriented to person, place, and time. He appears well-developed and well-nourished.       Appears weak but not toxic Blood pressure  140 rate 80 O2 saturation and pulse rate both 96  HENT:  Head: Normocephalic.  Right Ear: External ear normal.  Left Ear: External ear normal.  Eyes: Conjunctivae and EOM are normal.  Neck: Normal range of motion.  Cardiovascular: Normal rate and normal heart sounds.   Pulmonary/Chest: Effort normal and breath sounds normal. No respiratory distress. He has no wheezes. He has no rales.  Abdominal: Soft. Bowel sounds are normal. He exhibits no distension. There is no tenderness.  Musculoskeletal: Normal range of motion. He exhibits no edema and no tenderness.  Neurological: He is alert and oriented to person, place, and time.  Skin: He is not diaphoretic.  Psychiatric: He has a normal mood and affect. His behavior is normal.          Assessment & Plan:   Fever and chills in an immunocompromise host RA Hypertension Dyslipidemia  We'll obtain labs chest x-ray and a set of blood cultures. We'll treat with Rocephin 1 g IM and placed on erythromycin. A chest x-ray will be reviewed. If there is any clinical deterioration limit to the hospital for further evaluation and treatment

## 2012-03-07 NOTE — Progress Notes (Signed)
Quick Note:  Spoke with pt- informed of resutls and dr. Vernon Prey instructions - drink plenty of fluids and keep appt on monday ______

## 2012-03-10 ENCOUNTER — Ambulatory Visit (INDEPENDENT_AMBULATORY_CARE_PROVIDER_SITE_OTHER): Payer: Medicare Other | Admitting: Internal Medicine

## 2012-03-10 ENCOUNTER — Encounter: Payer: Self-pay | Admitting: Internal Medicine

## 2012-03-10 VITALS — BP 120/80 | Temp 97.6°F | Wt 180.0 lb

## 2012-03-10 DIAGNOSIS — R7881 Bacteremia: Secondary | ICD-10-CM

## 2012-03-10 DIAGNOSIS — I10 Essential (primary) hypertension: Secondary | ICD-10-CM

## 2012-03-10 DIAGNOSIS — M069 Rheumatoid arthritis, unspecified: Secondary | ICD-10-CM

## 2012-03-10 MED ORDER — PENICILLIN V POTASSIUM 500 MG PO TABS: 500.0000 mg | ORAL_TABLET | Freq: Four times a day (QID) | ORAL | Status: DC

## 2012-03-10 MED ORDER — CEFTRIAXONE SODIUM 1 G IJ SOLR
1.0000 g | Freq: Once | INTRAMUSCULAR | Status: AC
  Administered 2012-03-10: 1 g via INTRAMUSCULAR

## 2012-03-10 NOTE — Progress Notes (Signed)
  Subjective:    Patient ID: Brett Castaneda, male    DOB: 1940-12-19, 71 y.o.   MRN: [id number]  HPI  71 year old patient who is seen today in followup. He was seen 3 days ago with abrupt onset of fever chills malaise and mild cough. Evaluation included a chest x-ray that was consistent with pneumonia. Blood cultures were obtained and preliminary results show gram-positive cocci. 3 days ago he was treated with Rocephin 1 g IM and has taken 4 of 6 dosages of azithromycin. He has noted a prompt clinical improvement and today he feels quite well.  He is immunocompromised due to RA and treatment with methotrexate and Humira. Sensitivities are pending    Review of Systems  Constitutional: Positive for fever, chills, diaphoresis, activity change, appetite change and fatigue.  HENT: Negative for hearing loss, ear pain, congestion, sore throat, trouble swallowing, neck stiffness, dental problem, voice change and tinnitus.   Eyes: Negative for pain, discharge and visual disturbance.  Respiratory: Positive for cough (resolved). Negative for chest tightness, wheezing and stridor.   Cardiovascular: Negative for chest pain, palpitations and leg swelling.  Gastrointestinal: Negative for nausea, vomiting, abdominal pain, diarrhea, constipation, blood in stool and abdominal distention.  Genitourinary: Negative for urgency, hematuria, flank pain, discharge, difficulty urinating and genital sores.  Musculoskeletal: Negative for myalgias, back pain, joint swelling, arthralgias and gait problem.  Skin: Negative for rash.  Neurological: Negative for dizziness, syncope, speech difficulty, weakness, numbness and headaches.  Hematological: Negative for adenopathy. Does not bruise/bleed easily.  Psychiatric/Behavioral: Negative for behavioral problems and dysphoric mood. The patient is not nervous/anxious.        Objective:   Physical Exam  Constitutional: He is oriented to person, place, and time. He appears  well-developed.  HENT:  Head: Normocephalic.  Right Ear: External ear normal.  Left Ear: External ear normal.  Eyes: Conjunctivae and EOM are normal.  Neck: Normal range of motion.  Cardiovascular: Normal rate and normal heart sounds.   Pulmonary/Chest: Breath sounds normal.       Chest remains clear O2 saturation 98  Abdominal: Bowel sounds are normal.  Musculoskeletal: Normal range of motion. He exhibits no edema and no tenderness.  Neurological: He is alert and oriented to person, place, and time.  Psychiatric: He has a normal mood and affect. His behavior is normal.          Assessment & Plan:   Probable pneumococcal pneumonia with bacteremia. We'll complete therapy with azithromycin. Pending culture and sensitivities Will retreat with Rocephin 1 g IM today. Will treat with Pen-Vee K 500 4 times a day for 10 additional days. Patient is aware that based on sensitivities antibiotic therapy may be changed. Hypertension stable RA

## 2012-03-10 NOTE — Progress Notes (Signed)
Quick Note:  Pt has rov today st 1245 ______

## 2012-03-10 NOTE — Patient Instructions (Signed)
Take your antibiotic as prescribed until ALL of it is gone, but stop if you develop a rash, swelling, or any side effects of the medication.  Contact our office as soon as possible if  there are side effects of the medication.  Call or return to clinic prn if these symptoms worsen or fail to improve as anticipated.  

## 2012-03-11 LAB — CULTURE, BLOOD (SINGLE)

## 2012-03-12 ENCOUNTER — Telehealth: Payer: Self-pay

## 2012-03-12 NOTE — Telephone Encounter (Signed)
Spoke with wife - discuss meds- and to call with changes or fever

## 2012-03-12 NOTE — Telephone Encounter (Signed)
Wife called - doing fair , no fever, still no energy , little appetite, fluid intake fair - just f/u on making sure he is on the right meds.  please advise

## 2012-03-12 NOTE — Telephone Encounter (Signed)
Continue same

## 2012-03-14 LAB — CULTURE, BLOOD (SINGLE): Organism ID, Bacteria: NO GROWTH

## 2012-03-17 ENCOUNTER — Other Ambulatory Visit: Payer: Self-pay | Admitting: Internal Medicine

## 2012-03-21 ENCOUNTER — Encounter: Payer: Self-pay | Admitting: Internal Medicine

## 2012-03-21 ENCOUNTER — Ambulatory Visit: Payer: Medicare Other | Admitting: Internal Medicine

## 2012-03-21 ENCOUNTER — Ambulatory Visit (INDEPENDENT_AMBULATORY_CARE_PROVIDER_SITE_OTHER): Payer: Medicare Other | Admitting: Internal Medicine

## 2012-03-21 ENCOUNTER — Telehealth: Payer: Self-pay | Admitting: Internal Medicine

## 2012-03-21 VITALS — BP 130/80 | Temp 97.7°F | Wt 181.0 lb

## 2012-03-21 DIAGNOSIS — J189 Pneumonia, unspecified organism: Secondary | ICD-10-CM

## 2012-03-21 DIAGNOSIS — I10 Essential (primary) hypertension: Secondary | ICD-10-CM

## 2012-03-21 DIAGNOSIS — M069 Rheumatoid arthritis, unspecified: Secondary | ICD-10-CM

## 2012-03-21 LAB — CBC WITH DIFFERENTIAL/PLATELET
Basophils Absolute: 0 10*3/uL (ref 0.0–0.1)
Eosinophils Absolute: 0.3 10*3/uL (ref 0.0–0.7)
HCT: 40 % (ref 39.0–52.0)
Hemoglobin: 12.9 g/dL — ABNORMAL LOW (ref 13.0–17.0)
Lymphs Abs: 2.9 10*3/uL (ref 0.7–4.0)
MCHC: 32.2 g/dL (ref 30.0–36.0)
Monocytes Absolute: 1.2 10*3/uL — ABNORMAL HIGH (ref 0.1–1.0)
Neutro Abs: 4.6 10*3/uL (ref 1.4–7.7)
Platelets: 325 10*3/uL (ref 150.0–400.0)
RDW: 14.7 % — ABNORMAL HIGH (ref 11.5–14.6)

## 2012-03-21 LAB — COMPREHENSIVE METABOLIC PANEL
ALT: 18 U/L (ref 0–53)
AST: 25 U/L (ref 0–37)
Chloride: 103 mEq/L (ref 96–112)
Creatinine, Ser: 1 mg/dL (ref 0.4–1.5)
Sodium: 138 mEq/L (ref 135–145)
Total Bilirubin: 0.5 mg/dL (ref 0.3–1.2)
Total Protein: 7.6 g/dL (ref 6.0–8.3)

## 2012-03-21 NOTE — Telephone Encounter (Signed)
Called pts wife and sch work in ov for today at 2:15pm with pcp as noted.

## 2012-03-21 NOTE — Telephone Encounter (Signed)
Pts spouse called to check on status of getting pt work in appt today with Dr Amador Cunas re: fatigue, no energy, pale.

## 2012-03-21 NOTE — Telephone Encounter (Signed)
  Caller: Robbi/Spouse; PCP: Eleonore Chiquito; CB#: [phone];  Call regarding F/U After PCN for Bacteremia and pneumonia.  Started PCN 03/10/12. after F/U office visit.  Wife calling does he need a F/U appt to redo blood work. Now calling that he is fatigued and has no energy.  Triaged Fatigue and has lost 5 lbs in the past week.  Disp = needs to be seen in 24 hrs and no appts available.  PLEASE CALL PT FOR WORK-IN APPT OR TO ADVISE.   Home care and call back inst given.

## 2012-03-21 NOTE — Patient Instructions (Signed)
Slowly resume your usual activities  Call or return to clinic prn if these symptoms worsen or fail to improve as anticipated.  

## 2012-03-21 NOTE — Telephone Encounter (Signed)
Okay to work in today or tomorrow's Saturday clinic

## 2012-03-21 NOTE — Progress Notes (Signed)
  Subjective:    Patient ID: Brett Castaneda, male    DOB: Jan 25, 1941, 71 y.o.   MRN: [id number]  HPI  71 year old patient who was treated recently for a community-acquired pneumonia in the setting of immunocompromise with methotrexate and Humira.  He initially was felt to have a bacteremia associated with the pneumonia when an initial blood culture revealed gram-positive cocci. Subsequent culture revealed staph aureus coagulase-negative and only one of 2 blood cultures and this was probably a skin contaminant.  Today he complains of persistent fatigue and poor appetite this seems to be improving slowly. He has completed antibiotic therapy. He is sleeping poorly. No fever or chills denies any cough    Review of Systems  Constitutional: Positive for activity change, appetite change and fatigue. Negative for fever and diaphoresis.  HENT: Negative for hearing loss, ear pain, congestion, sore throat, trouble swallowing, neck stiffness, dental problem, voice change and tinnitus.   Eyes: Negative for pain, discharge and visual disturbance.  Respiratory: Negative for cough, chest tightness, wheezing and stridor.   Cardiovascular: Negative for chest pain, palpitations and leg swelling.  Gastrointestinal: Negative for nausea, vomiting, abdominal pain, diarrhea, constipation, blood in stool and abdominal distention.  Genitourinary: Negative for urgency, hematuria, flank pain, discharge, difficulty urinating and genital sores.  Musculoskeletal: Negative for myalgias, back pain, joint swelling, arthralgias and gait problem.  Skin: Negative for rash.  Neurological: Positive for weakness. Negative for dizziness, syncope, speech difficulty, numbness and headaches.  Hematological: Negative for adenopathy. Does not bruise/bleed easily.  Psychiatric/Behavioral: Negative for behavioral problems and dysphoric mood. The patient is not nervous/anxious.        Objective:   Physical Exam  Constitutional: He is oriented  to person, place, and time. He appears well-developed.  HENT:  Head: Normocephalic.  Right Ear: External ear normal.  Left Ear: External ear normal.  Eyes: Conjunctivae and EOM are normal.  Neck: Normal range of motion.  Cardiovascular: Normal rate and normal heart sounds.   Pulmonary/Chest: Effort normal and breath sounds normal. No respiratory distress. He has no wheezes. He has no rales.       Pulse 64 O2 saturation 97  Abdominal: Bowel sounds are normal.  Musculoskeletal: Normal range of motion. He exhibits no edema and no tenderness.  Neurological: He is alert and oriented to person, place, and time.  Psychiatric: He has a normal mood and affect. His behavior is normal.          Assessment & Plan:   Status post community acquired pneumonia Fatigue probably secondary to deconditioning. We'll check a CBC to hopefully show resolution of his leukocytosis as well as chemistries. He did have a mild elevated bilirubin and suppressed serum sodium. We'll slowly increase his level of activity Hypertension stable Rheumatoid arthritis

## 2012-03-28 ENCOUNTER — Other Ambulatory Visit: Payer: Self-pay | Admitting: Internal Medicine

## 2012-04-04 ENCOUNTER — Telehealth: Payer: Self-pay | Admitting: Internal Medicine

## 2012-04-04 NOTE — Telephone Encounter (Signed)
Spoke with pt- informed of dr. Vernon Prey instructions to eval at next rov

## 2012-04-04 NOTE — Telephone Encounter (Signed)
We'll assess the need for a Pneumovax at the time of his next office visit

## 2012-04-04 NOTE — Telephone Encounter (Signed)
We'll assess the need for a Pneumovax at the time of his next office visit 

## 2012-04-04 NOTE — Telephone Encounter (Signed)
Caller: Robbie/Spouse; Phone Number: [phone]; Message from caller: Pt wants to know if he needs to get Pneumonia vaccine since he has already had Pneumonia twice this year. Wife wants to also give doctor FYI that she knows they were told it will take a while to recover but he still has decreased energy and appetite.

## 2012-06-20 ENCOUNTER — Encounter: Payer: Self-pay | Admitting: Internal Medicine

## 2012-06-20 ENCOUNTER — Ambulatory Visit (INDEPENDENT_AMBULATORY_CARE_PROVIDER_SITE_OTHER): Payer: Medicare Other | Admitting: Internal Medicine

## 2012-06-20 VITALS — BP 130/80 | Temp 98.2°F | Wt 184.0 lb

## 2012-06-20 DIAGNOSIS — Z0000 Encounter for general adult medical examination without abnormal findings: Secondary | ICD-10-CM

## 2012-06-20 DIAGNOSIS — E785 Hyperlipidemia, unspecified: Secondary | ICD-10-CM

## 2012-06-20 DIAGNOSIS — Z23 Encounter for immunization: Secondary | ICD-10-CM

## 2012-06-20 DIAGNOSIS — M069 Rheumatoid arthritis, unspecified: Secondary | ICD-10-CM

## 2012-06-20 DIAGNOSIS — I10 Essential (primary) hypertension: Secondary | ICD-10-CM

## 2012-06-20 MED ORDER — PNEUMOCOCCAL VAC POLYVALENT 25 MCG/0.5ML IJ INJ: 0.5000 mL | INJECTION | Freq: Once | INTRAMUSCULAR | Status: DC

## 2012-06-20 NOTE — Patient Instructions (Signed)
Limit your sodium (Salt) intake    It is important that you exercise regularly, at least 20 minutes 3 to 4 times per week.  If you develop chest pain or shortness of breath seek  medical attention.  Return in 6 months for follow-up  

## 2012-06-20 NOTE — Progress Notes (Signed)
Subjective:    Patient ID: Brett Castaneda, male    DOB: 02-Mar-1941, 71 y.o.   MRN: [id number]  HPI  71 year old patient who is seen today for followup of hypertension and dyslipidemia he is followed by rheumatology for RA and remains on methotrexate therapy.  Humira has been discontinued several weeks ago and the patient remains quite stable. He has had a history GERD this year of community-acquired pneumonia. Today he feels quite well.  Past Medical History  Diagnosis Date  . BASAL CELL CARCINOMA, FACE 03/31/2009  . CEREBROVASCULAR ACCIDENT, HX OF 03/24/2007  . CONSTIPATION 06/22/2008  . DIVERTICULOSIS, COLON 03/24/2007  . GERD 03/24/2007  . HYPERLIPIDEMIA 08/27/2007  . HYPERTENSION 12/14/2008  . Rheumatoid arthritis 03/24/2007  . Blind right eye   . Retinal detachment     hx of  . Stroke     History   Social History  . Marital Status: Married    Spouse Name: N/A    Number of Children: N/A  . Years of Education: N/A   Occupational History  . Not on file.   Social History Main Topics  . Smoking status: Former Smoker -- 23 years    Types: Cigarettes    Quit date: 09/24/1980  . Smokeless tobacco: Never Used  . Alcohol Use: No  . Drug Use: No  . Sexually Active: Not on file   Other Topics Concern  . Not on file   Social History Narrative  . No narrative on file    Past Surgical History  Procedure Date  . Total knee arthroplasty     bital  . Eye surgery     retinal detachment  . Carpal tunnel release   . Carotid endarterectomy   . Cataract extraction 1980    removed cataract in left eye. Currently blind in right eye  . Cholecystectomy 2001    Gall Bladder  . Retinal detachment surgery 2004    Family History  Problem Relation Age of Onset  . Stroke Mother 93  . Heart disease Father   . Coronary artery disease Sister   . Heart disease Sister   . Diabetes Sister   . Hypertension Sister     Allergies  Allergen Reactions  . Dopamine Anaphylaxis  . Morphine And  Related     hallucination  . Vicodin (Hydrocodone-Acetaminophen)     GI Upset    Current Outpatient Prescriptions on File Prior to Visit  Medication Sig Dispense Refill  . aspirin 325 MG tablet Take 325 mg by mouth daily.       . FOLBIC 2.5-25-2 MG TABS take 1 tablet by mouth once daily  90 tablet  1  . folic acid-pyridoxine-cyancobalamin (FOLTX) 2.5-25-2 MG TABS Take 1 tablet by mouth daily.      Marland Kitchen latanoprost (XALATAN) 0.005 % ophthalmic solution Place 1 drop into the left eye at bedtime.       . methotrexate (RHEUMATREX) 2.5 MG tablet Take 10 mg by mouth 2 (two) times a week. Sunday and Monday; Caution:Chemotherapy. Protect from light. As directed      . Multiple Vitamins-Minerals (ICAPS) CAPS Take 2 capsules by mouth daily.      Marland Kitchen omeprazole (PRILOSEC) 40 MG capsule Take 40 mg by mouth every evening.      . polyethylene glycol powder (GLYCOLAX/MIRALAX) powder TAKE 1 SCOOP IN 8 OUNCES OF WATER TWICE A DAY  527 g  3  . timolol (TIMOPTIC) 0.5 % ophthalmic solution Place 1 drop into the left eye daily.  BP 130/80  Temp 98.2 F (36.8 C) (Oral)  Wt 184 lb (83.462 kg)       Review of Systems  Constitutional: Negative for fever, chills, appetite change and fatigue.  HENT: Negative for hearing loss, ear pain, congestion, sore throat, trouble swallowing, neck stiffness, dental problem, voice change and tinnitus.   Eyes: Negative for pain, discharge and visual disturbance.  Respiratory: Negative for cough, chest tightness, wheezing and stridor.   Cardiovascular: Negative for chest pain, palpitations and leg swelling.  Gastrointestinal: Negative for nausea, vomiting, abdominal pain, diarrhea, constipation, blood in stool and abdominal distention.  Genitourinary: Negative for urgency, hematuria, flank pain, discharge, difficulty urinating and genital sores.  Musculoskeletal: Negative for myalgias, back pain, joint swelling, arthralgias and gait problem.  Skin: Negative for rash.    Neurological: Negative for dizziness, syncope, speech difficulty, weakness, numbness and headaches.  Hematological: Negative for adenopathy. Does not bruise/bleed easily.  Psychiatric/Behavioral: Negative for behavioral problems and dysphoric mood. The patient is not nervous/anxious.        Objective:   Physical Exam  Constitutional: He is oriented to person, place, and time. He appears well-developed.  HENT:  Head: Normocephalic.  Right Ear: External ear normal.  Left Ear: External ear normal.  Eyes: Conjunctivae normal and EOM are normal.  Neck: Normal range of motion.  Cardiovascular: Normal rate and normal heart sounds.   Pulmonary/Chest: Breath sounds normal.  Abdominal: Bowel sounds are normal.  Musculoskeletal: Normal range of motion. He exhibits no edema and no tenderness.  Neurological: He is alert and oriented to person, place, and time.  Psychiatric: He has a normal mood and affect. His behavior is normal.          Assessment & Plan:   Hypertension well controlled Dyslipidemia stable Rheumatoid arthritis. Followup rheumatology  Flu vaccine and Pneumovax dispensed Recheck 6 months

## 2012-06-24 DIAGNOSIS — L570 Actinic keratosis: Secondary | ICD-10-CM

## 2012-06-24 HISTORY — DX: Actinic keratosis: L57.0

## 2012-07-29 ENCOUNTER — Telehealth: Payer: Self-pay | Admitting: Internal Medicine

## 2012-07-29 NOTE — Telephone Encounter (Signed)
Caller: Robbie/Spouse; Patient Name: Brett Castaneda; PCP: Eleonore Chiquito [facility]); Best Callback Phone Number: [phone].  Spouse calling about possible shingles outbreak on his scalp.  ONset of "soreness" 1 week ago.  States he can not lie on the right side of his head.  Has small blisterlike bumps in scalp.   Temp/tactile. Per skin lesions protocol, emergent symptoms denied; advised appt within 24 hours.   Appt scheduled 07/30/12 0915 due to car issues.

## 2012-07-30 ENCOUNTER — Ambulatory Visit (INDEPENDENT_AMBULATORY_CARE_PROVIDER_SITE_OTHER): Payer: Medicare Other | Admitting: Internal Medicine

## 2012-07-30 ENCOUNTER — Encounter: Payer: Self-pay | Admitting: Neurosurgery

## 2012-07-30 ENCOUNTER — Encounter: Payer: Self-pay | Admitting: Internal Medicine

## 2012-07-30 VITALS — BP 122/80 | Temp 97.9°F | Wt 186.0 lb

## 2012-07-30 DIAGNOSIS — M069 Rheumatoid arthritis, unspecified: Secondary | ICD-10-CM

## 2012-07-30 DIAGNOSIS — I10 Essential (primary) hypertension: Secondary | ICD-10-CM

## 2012-07-30 DIAGNOSIS — R51 Headache: Secondary | ICD-10-CM

## 2012-07-30 NOTE — Progress Notes (Signed)
  Subjective:    Patient ID: Brett Castaneda, male    DOB: [birth year], 71 y.o.   MRN: [id number]  HPI  71 year old patient who presents with a chief complaint no tenderness involving the right temporal scalp area. About 3 weeks ago while working a 2 x 4 slept and he sustained trauma to this area he has had persistent pain and tenderness over the scalp but no rash. He states that there is discomfort when he lies on his right side. He ados es have a history of rheumatoid arthritis that has been stable.  .b    Review of Systems  Constitutional: Negative for fever, chills, appetite change and fatigue.  HENT: Negative for hearing loss, ear pain, congestion, sore throat, trouble swallowing, neck stiffness, dental problem, voice change and tinnitus.   Eyes: Negative for pain, discharge and visual disturbance.  Respiratory: Negative for cough, chest tightness, wheezing and stridor.   Cardiovascular: Negative for chest pain, palpitations and leg swelling.  Gastrointestinal: Negative for nausea, vomiting, abdominal pain, diarrhea, constipation, blood in stool and abdominal distention.  Genitourinary: Negative for urgency, hematuria, flank pain, discharge, difficulty urinating and genital sores.  Musculoskeletal: Negative for myalgias, back pain, joint swelling, arthralgias and gait problem.  Skin: Negative for rash.  Neurological: Positive for headaches. Negative for dizziness, syncope, speech difficulty, weakness and numbness.  Hematological: Negative for adenopathy. Does not bruise/bleed easily.  Psychiatric/Behavioral: Negative for behavioral problems and dysphoric mood. The patient is not nervous/anxious.        Objective:   Physical Exam  Constitutional: He is oriented to person, place, and time. He appears well-developed.  HENT:  Head: Normocephalic.  Right Ear: External ear normal.  Left Ear: External ear normal.  Eyes: Conjunctivae normal and EOM are normal.  Neck: Normal range of motion.   Cardiovascular: Normal rate and normal heart sounds.   Pulmonary/Chest: Breath sounds normal.  Abdominal: Bowel sounds are normal.  Musculoskeletal: Normal range of motion. He exhibits no edema and no tenderness.  Neurological: He is alert and oriented to person, place, and time.  Skin:       Slight tenderness over the right temporal scalp area. No dermatitis  Psychiatric: He has a normal mood and affect. His behavior is normal.          Assessment & Plan:  Right scalp tenderness. No evidence of dermatitis possibly related to contusion from recent trauma. Will treat with Celebrex 200 mg daily for a few days and observe Rheumatoid arthritis stable Hypertension stable

## 2012-07-30 NOTE — Patient Instructions (Signed)
Call or return to clinic prn if these symptoms worsen or fail to improve as anticipated.  Celebrex 200 mg capsule once daily

## 2012-07-31 ENCOUNTER — Ambulatory Visit (INDEPENDENT_AMBULATORY_CARE_PROVIDER_SITE_OTHER): Payer: Medicare Other | Admitting: Neurosurgery

## 2012-07-31 ENCOUNTER — Encounter: Payer: Self-pay | Admitting: Neurosurgery

## 2012-07-31 ENCOUNTER — Other Ambulatory Visit (INDEPENDENT_AMBULATORY_CARE_PROVIDER_SITE_OTHER): Payer: Medicare Other | Admitting: *Deleted

## 2012-07-31 VITALS — BP 126/75 | HR 71 | Resp 16 | Ht 71.0 in | Wt 184.0 lb

## 2012-07-31 DIAGNOSIS — I6529 Occlusion and stenosis of unspecified carotid artery: Secondary | ICD-10-CM

## 2012-07-31 DIAGNOSIS — Z48812 Encounter for surgical aftercare following surgery on the circulatory system: Secondary | ICD-10-CM

## 2012-07-31 NOTE — Progress Notes (Signed)
[facility] Carotid Office Note  CC: Carotid surveillance Referring Physician: Fields  History of Present Illness: 71 year old male patient of Dr. Darrick Castaneda status post right CEA in 2008. The patient denies any signs or symptoms of CVA, TIA, amaurosis fugax or any neural deficit. The patient denies any new medical diagnoses or recent surgery.  Past Medical History  Diagnosis Date  . BASAL CELL CARCINOMA, FACE 03/31/2009  . CEREBROVASCULAR ACCIDENT, HX OF 03/24/2007  . CONSTIPATION 06/22/2008  . DIVERTICULOSIS, COLON 03/24/2007  . GERD 03/24/2007  . HYPERLIPIDEMIA 08/27/2007  . HYPERTENSION 12/14/2008  . Rheumatoid arthritis 03/24/2007  . Blind right eye   . Retinal detachment     hx of  . Stroke     ROS: [x]  Positive   [ ]  Denies    General: [ ]  Weight loss, [ ]  Fever, [ ]  chills Neurologic: [ ]  Dizziness, [ ]  Blackouts, [ ]  Seizure [ ]  Stroke, [ ]  "Mini stroke", [ ]  Slurred speech, [ ]  Temporary blindness; [ ]  weakness in arms or legs, [ ]  Hoarseness Cardiac: [ ]  Chest pain/pressure, [ ]  Shortness of breath at rest [ ]  Shortness of breath with exertion, [ ]  Atrial fibrillation or irregular heartbeat Vascular: [ ]  Pain in legs with walking, [ ]  Pain in legs at rest, [ ]  Pain in legs at night,  [ ]  Non-healing ulcer, [ ]  Blood clot in vein/DVT,   Pulmonary: [ ]  Home oxygen, [ ]  Productive cough, [ ]  Coughing up blood, [ ]  Asthma,  [ ]  Wheezing Musculoskeletal:  [ ]  Arthritis, [ ]  Low back pain, [ ]  Joint pain Hematologic: [ ]  Easy Bruising, [ ]  Anemia; [ ]  Hepatitis Gastrointestinal: [ ]  Blood in stool, [ ]  Gastroesophageal Reflux/heartburn, [ ]  Trouble swallowing Urinary: [ ]  chronic Kidney disease, [ ]  on HD - [ ]  MWF or [ ]  TTHS, [ ]  Burning with urination, [ ]  Difficulty urinating Skin: [ ]  Rashes, [ ]  Wounds Psychological: [ ]  Anxiety, [ ]  Depression   Social History History  Substance Use Topics  . Smoking status: Former Smoker -- 23 years   Types: Cigarettes    Quit date: 09/24/1980  . Smokeless tobacco: Never Used  . Alcohol Use: No    Family History Family History  Problem Relation Age of Onset  . Stroke Mother 52  . Heart disease Father   . Coronary artery disease Sister   . Heart disease Sister   . Diabetes Sister   . Hypertension Sister     Allergies  Allergen Reactions  . Dopamine Anaphylaxis  . Morphine And Related     hallucination  . Vicodin (Hydrocodone-Acetaminophen)     GI Upset    Current Outpatient Prescriptions  Medication Sig Dispense Refill  . aspirin 325 MG tablet Take 325 mg by mouth daily.       . FOLBIC 2.5-25-2 MG TABS take 1 tablet by mouth once daily  90 tablet  1  . folic acid-pyridoxine-cyancobalamin (FOLTX) 2.5-25-2 MG TABS Take 1 tablet by mouth daily.      Marland Kitchen latanoprost (XALATAN) 0.005 % ophthalmic solution Place 1 drop into the left eye at bedtime.       . methotrexate (RHEUMATREX) 2.5 MG tablet Take 10 mg by mouth 2 (two) times a week. Sunday and Monday; Caution:Chemotherapy. Protect from light. As directed      . Multiple Vitamins-Minerals (ICAPS) CAPS Take 2 capsules by mouth daily.      Marland Kitchen omeprazole (  PRILOSEC) 40 MG capsule Take 40 mg by mouth every evening.      . polyethylene glycol powder (GLYCOLAX/MIRALAX) powder TAKE 1 SCOOP IN 8 OUNCES OF WATER TWICE A DAY  527 g  3  . timolol (TIMOPTIC) 0.5 % ophthalmic solution Place 1 drop into the left eye daily.         Physical Examination  Filed Vitals:   07/31/12 1413  BP: 126/75  Pulse: 71  Resp: 16    Body mass index is 25.66 kg/(m^2).  General:  WDWN in NAD Gait: Normal HEENT: WNL Eyes: Pupils equal Pulmonary: normal non-labored breathing , without Rales, rhonchi,  wheezing Cardiac: RRR, without  Murmurs, rubs or gallops; Abdomen: soft, NT, no masses Skin: no rashes, ulcers noted  Vascular Exam Pulses: 3+ radial pulses bilaterally Carotid bruits: Carotid pulses to auscultation no bruits are heard Extremities  without ischemic changes, no Gangrene , no cellulitis; no open wounds;  Musculoskeletal: no muscle wasting or atrophy   Neurologic: A&O X 3; Appropriate Affect ; SENSATION: normal; MOTOR FUNCTION:  moving all extremities equally. Speech is fluent/normal  Non-Invasive Vascular Imaging CAROTID DUPLEX 07/31/2012  Right ICA 0 - 19% stenosis Left ICA 20 - 39 % stenosis   ASSESSMENT/PLAN: Asymptomatic patient with mild left ICA stenosis, the patient will followup in one year with repeat carotid duplex. The patient's questions were encouraged and answered, he is in agreement with this plan.  Lauree Chandler ANP   Clinic MD: Darrick Castaneda

## 2012-08-01 NOTE — Addendum Note (Signed)
Addended by: Sharee Pimple on: 08/01/2012 08:54 AM   Modules accepted: Orders

## 2012-09-26 ENCOUNTER — Telehealth: Payer: Self-pay | Admitting: Internal Medicine

## 2012-09-26 MED ORDER — CELECOXIB 200 MG PO CAPS: 200.0000 mg | ORAL_CAPSULE | Freq: Two times a day (BID) | ORAL | Status: DC

## 2012-09-26 NOTE — Telephone Encounter (Signed)
Pt was given some samples in Nov, 2013 of Celebrex and it has helped him tremendously. He wants to know if he can get a Rx for either Celebrex or the generic if there is one called in to his pharmacy, [facility] on [address]. [phone]? Thank you.

## 2012-09-26 NOTE — Telephone Encounter (Signed)
Pt's wife notified that Rx for Celebrex was sent to pharmacy, generic form medication may be expensive not sure. Pt's wife verbalized understanding.

## 2012-09-26 NOTE — Telephone Encounter (Signed)
Okay to refill Celebrex 200 mg #30 one daily refill x6. Notify patient this medication may be quite expensive

## 2012-10-24 ENCOUNTER — Other Ambulatory Visit: Payer: Self-pay | Admitting: Internal Medicine

## 2012-12-18 ENCOUNTER — Encounter: Payer: Self-pay | Admitting: Internal Medicine

## 2012-12-18 ENCOUNTER — Ambulatory Visit (INDEPENDENT_AMBULATORY_CARE_PROVIDER_SITE_OTHER): Payer: Medicare Other | Admitting: Internal Medicine

## 2012-12-18 VITALS — BP 124/80 | HR 65 | Temp 97.8°F | Resp 18 | Wt 184.0 lb

## 2012-12-18 DIAGNOSIS — E785 Hyperlipidemia, unspecified: Secondary | ICD-10-CM

## 2012-12-18 DIAGNOSIS — M069 Rheumatoid arthritis, unspecified: Secondary | ICD-10-CM

## 2012-12-18 DIAGNOSIS — I10 Essential (primary) hypertension: Secondary | ICD-10-CM

## 2012-12-18 DIAGNOSIS — K219 Gastro-esophageal reflux disease without esophagitis: Secondary | ICD-10-CM

## 2012-12-18 MED ORDER — CELECOXIB 200 MG PO CAPS: 200.0000 mg | ORAL_CAPSULE | Freq: Two times a day (BID) | ORAL | Status: DC

## 2012-12-18 MED ORDER — OMEPRAZOLE 40 MG PO CPDR: DELAYED_RELEASE_CAPSULE | ORAL | Status: DC

## 2012-12-18 NOTE — Patient Instructions (Signed)
Limit your sodium (Salt) intake  Please check your blood pressure on a regular basis.  If it is consistently greater than 150/90, please make an office appointment.  Return in 6 months for follow-up   

## 2012-12-18 NOTE — Progress Notes (Signed)
Subjective:    Patient ID: Brett Castaneda, male    DOB: 1941/04/02, 72 y.o.   MRN: [id number]  HPI  72 year old patient who is seen today for followup. He has a history of rheumatoid arthritis but does quite well. He is able to perform vigorous activities such as chopping wood without much difficulty. Remains on methotrexate. No concerns or complaints today  Past Medical History  Diagnosis Date  . BASAL CELL CARCINOMA, FACE 03/31/2009  . CEREBROVASCULAR ACCIDENT, HX OF 03/24/2007  . CONSTIPATION 06/22/2008  . DIVERTICULOSIS, COLON 03/24/2007  . GERD 03/24/2007  . HYPERLIPIDEMIA 08/27/2007  . HYPERTENSION 12/14/2008  . Rheumatoid arthritis 03/24/2007  . Blind right eye   . Retinal detachment     hx of  . Stroke   . Keratosis Oct. 2013    History   Social History  . Marital Status: Married    Spouse Name: N/A    Number of Children: N/A  . Years of Education: N/A   Occupational History  . Not on file.   Social History Main Topics  . Smoking status: Former Smoker -- 23 years    Types: Cigarettes    Quit date: 09/24/1980  . Smokeless tobacco: Never Used  . Alcohol Use: No  . Drug Use: No  . Sexually Active: Not on file   Other Topics Concern  . Not on file   Social History Narrative  . No narrative on file    Past Surgical History  Procedure Laterality Date  . Total knee arthroplasty      bital  . Eye surgery      retinal detachment  . Carpal tunnel release    . Carotid endarterectomy    . Cataract extraction  1980    removed cataract in left eye. Currently blind in right eye  . Cholecystectomy  2001    Gall Bladder  . Retinal detachment surgery  2004    Family History  Problem Relation Age of Onset  . Stroke Mother 54  . Heart disease Father   . Coronary artery disease Sister   . Heart disease Sister   . Diabetes Sister   . Hypertension Sister     Allergies  Allergen Reactions  . Dopamine Anaphylaxis  . Morphine And Related     hallucination  . Vicodin  (Hydrocodone-Acetaminophen)     GI Upset    Current Outpatient Prescriptions on File Prior to Visit  Medication Sig Dispense Refill  . aspirin 325 MG tablet Take 325 mg by mouth daily.       . celecoxib (CELEBREX) 200 MG capsule Take 1 capsule (200 mg total) by mouth 2 (two) times daily.  30 capsule  6  . FOLBIC 2.5-25-2 MG TABS take 1 tablet by mouth once daily  90 tablet  1  . latanoprost (XALATAN) 0.005 % ophthalmic solution Place 1 drop into the left eye at bedtime.       . methotrexate (RHEUMATREX) 2.5 MG tablet Take 10 mg by mouth 2 (two) times a week. Sunday and Monday; Caution:Chemotherapy. Protect from light. As directed      . Multiple Vitamins-Minerals (ICAPS) CAPS Take 2 capsules by mouth daily.      Marland Kitchen omeprazole (PRILOSEC) 40 MG capsule take 1 capsule by mouth once daily  30 capsule  3  . polyethylene glycol powder (GLYCOLAX/MIRALAX) powder TAKE 1 SCOOP IN 8 OUNCES OF WATER TWICE A DAY  527 g  3  . timolol (TIMOPTIC) 0.5 % ophthalmic solution Place  1 drop into the left eye daily.        No current facility-administered medications on file prior to visit.    BP 124/80  Pulse 65  Temp(Src) 97.8 F (36.6 C) (Oral)  Resp 18  Wt 184 lb (83.462 kg)  BMI 25.67 kg/m2  SpO2 98%     Review of Systems  Constitutional: Negative for fever, chills, appetite change and fatigue.  HENT: Negative for hearing loss, ear pain, congestion, sore throat, trouble swallowing, neck stiffness, dental problem, voice change and tinnitus.   Eyes: Negative for pain, discharge and visual disturbance.  Respiratory: Negative for cough, chest tightness, wheezing and stridor.   Cardiovascular: Negative for chest pain, palpitations and leg swelling.  Gastrointestinal: Negative for nausea, vomiting, abdominal pain, diarrhea, constipation, blood in stool and abdominal distention.  Genitourinary: Negative for urgency, hematuria, flank pain, discharge, difficulty urinating and genital sores.   Musculoskeletal: Negative for myalgias, back pain, joint swelling, arthralgias and gait problem.  Skin: Negative for rash.  Neurological: Negative for dizziness, syncope, speech difficulty, weakness, numbness and headaches.  Hematological: Negative for adenopathy. Does not bruise/bleed easily.  Psychiatric/Behavioral: Negative for behavioral problems and dysphoric mood. The patient is not nervous/anxious.        Objective:   Physical Exam  Constitutional: He is oriented to person, place, and time. He appears well-developed.  HENT:  Head: Normocephalic.  Right Ear: External ear normal.  Left Ear: External ear normal.  Eyes: Conjunctivae and EOM are normal.  Neck: Normal range of motion.  Cardiovascular: Normal rate and normal heart sounds.   Pulmonary/Chest: Breath sounds normal.  Abdominal: Bowel sounds are normal.  Musculoskeletal: Normal range of motion. He exhibits no edema and no tenderness.  Neurological: He is alert and oriented to person, place, and time.  Psychiatric: He has a normal mood and affect. His behavior is normal.          Assessment & Plan:   Rheumatoid arthritis. History of hypertension. Blood pressure well controlled off medication History of cerebrovascular disease.   CPX 6 months

## 2013-01-27 ENCOUNTER — Other Ambulatory Visit (HOSPITAL_COMMUNITY): Payer: Self-pay | Admitting: Orthopedic Surgery

## 2013-01-27 DIAGNOSIS — M25561 Pain in right knee: Secondary | ICD-10-CM

## 2013-02-04 ENCOUNTER — Encounter (HOSPITAL_COMMUNITY)
Admission: RE | Admit: 2013-02-04 | Discharge: 2013-02-04 | Disposition: A | Discharge status: Home / Self Care | Payer: Worker's Compensation | Source: Ambulatory Visit | Attending: Orthopedic Surgery | Admitting: Orthopedic Surgery

## 2013-02-04 DIAGNOSIS — Z96659 Presence of unspecified artificial knee joint: Secondary | ICD-10-CM | POA: Insufficient documentation

## 2013-02-04 DIAGNOSIS — M25561 Pain in right knee: Secondary | ICD-10-CM

## 2013-02-04 DIAGNOSIS — M069 Rheumatoid arthritis, unspecified: Secondary | ICD-10-CM | POA: Insufficient documentation

## 2013-02-04 DIAGNOSIS — M25569 Pain in unspecified knee: Secondary | ICD-10-CM | POA: Insufficient documentation

## 2013-02-04 MED ORDER — TECHNETIUM TC 99M MEDRONATE IV KIT
25.0000 | PACK | Freq: Once | INTRAVENOUS | Status: AC | PRN
  Administered 2013-02-04: 25 via INTRAVENOUS

## 2013-03-04 ENCOUNTER — Ambulatory Visit (INDEPENDENT_AMBULATORY_CARE_PROVIDER_SITE_OTHER): Payer: Medicare Other | Admitting: Internal Medicine

## 2013-03-04 ENCOUNTER — Encounter: Payer: Self-pay | Admitting: Internal Medicine

## 2013-03-04 VITALS — BP 140/80 | HR 70 | Temp 98.5°F | Resp 20 | Wt 180.0 lb

## 2013-03-04 DIAGNOSIS — M069 Rheumatoid arthritis, unspecified: Secondary | ICD-10-CM

## 2013-03-04 DIAGNOSIS — I10 Essential (primary) hypertension: Secondary | ICD-10-CM

## 2013-03-04 LAB — COMPREHENSIVE METABOLIC PANEL
ALT: 23 U/L (ref 0–53)
AST: 30 U/L (ref 0–37)
CO2: 25 mEq/L (ref 19–32)
Chloride: 105 mEq/L (ref 96–112)
GFR: 65.43 mL/min (ref >60.00)
Sodium: 138 mEq/L (ref 135–145)
Total Bilirubin: 1.1 mg/dL (ref 0.3–1.2)
Total Protein: 7.4 g/dL (ref 6.0–8.3)

## 2013-03-04 LAB — CBC WITH DIFFERENTIAL/PLATELET
Basophils Absolute: 0 10*3/uL (ref 0.0–0.1)
Eosinophils Absolute: 0.5 10*3/uL (ref 0.0–0.7)
Hemoglobin: 13 g/dL (ref 13.0–17.0)
Lymphocytes Relative: 21.9 % (ref 12.0–46.0)
Lymphs Abs: 1.8 10*3/uL (ref 0.7–4.0)
MCHC: 33.2 g/dL (ref 30.0–36.0)
MCV: 99.2 fl (ref 78.0–100.0)
Monocytes Absolute: 0.5 10*3/uL (ref 0.1–1.0)
Neutro Abs: 5.4 10*3/uL (ref 1.4–7.7)
RDW: 15 % — ABNORMAL HIGH (ref 11.5–14.6)

## 2013-03-04 MED ORDER — DOXYCYCLINE HYCLATE 100 MG PO TABS: 100.0000 mg | ORAL_TABLET | Freq: Two times a day (BID) | ORAL | Status: DC

## 2013-03-04 NOTE — Progress Notes (Signed)
  Subjective:    Patient ID: Brett Castaneda, male    DOB: 08/03/1941, 72 y.o.   MRN: [id number]  HPI 72 year old who has a history of rheumatoid arthritis. He presents with about a 2-3 week history of general malaise fatigue he has had some associated headache and sore throat. He did remove a tick on May 27 that he felt poorly prior to the removal of the tic. There's been no documented fever.   Wt Readings from Last 3 Encounters:  03/04/13 180 lb (81.647 kg)  12/18/12 184 lb (83.462 kg)  07/31/12 184 lb (83.462 kg)      Review of Systems  Constitutional: Positive for activity change, appetite change and fatigue. Negative for fever and chills.  HENT: Positive for sore throat. Negative for hearing loss, ear pain, congestion, trouble swallowing, neck stiffness, dental problem, voice change and tinnitus.   Eyes: Negative for pain, discharge and visual disturbance.  Respiratory: Negative for cough, chest tightness, wheezing and stridor.   Cardiovascular: Negative for chest pain, palpitations and leg swelling.  Gastrointestinal: Negative for nausea, vomiting, abdominal pain, diarrhea, constipation, blood in stool and abdominal distention.  Genitourinary: Negative for urgency, hematuria, flank pain, discharge, difficulty urinating and genital sores.  Musculoskeletal: Negative for myalgias, back pain, joint swelling, arthralgias and gait problem.  Skin: Negative for rash.  Neurological: Positive for weakness and headaches. Negative for dizziness, syncope, speech difficulty and numbness.  Hematological: Negative for adenopathy. Does not bruise/bleed easily.  Psychiatric/Behavioral: Negative for behavioral problems and dysphoric mood. The patient is not nervous/anxious.        Objective:   Physical Exam  Constitutional: He is oriented to person, place, and time. He appears well-developed.  HENT:  Head: Normocephalic.  Right Ear: External ear normal.  Left Ear: External ear normal.  Eyes:  Conjunctivae and EOM are normal.  Neck: Normal range of motion.  Cardiovascular: Normal rate and normal heart sounds.   Pulmonary/Chest: Breath sounds normal.  Abdominal: Bowel sounds are normal.  Musculoskeletal: Normal range of motion. He exhibits no edema and no tenderness.  Rheumatoid hands  Neurological: He is alert and oriented to person, place, and time.  Psychiatric: He has a normal mood and affect. His behavior is normal.          Assessment & Plan:   Malaise Rheumatoid arthritis History of tick exposure  We'll treat with doxycycline for 10 days. Will check some updated lab. Followup rheumatology

## 2013-03-04 NOTE — Patient Instructions (Signed)
Take your antibiotic as prescribed until ALL of it is gone, but stop if you develop a rash, swelling, or any side effects of the medication.  Contact our office as soon as possible if  there are side effects of the medication.  Call or return to clinic prn if these symptoms worsen or fail to improve as anticipated.  

## 2013-03-05 ENCOUNTER — Telehealth: Payer: Self-pay | Admitting: Internal Medicine

## 2013-03-05 NOTE — Telephone Encounter (Signed)
Spoke to pt's wife Gaynelle Adu told her husbands lab work was normal per Dr. Amador Cunas. Robbie verbalized understanding.

## 2013-03-05 NOTE — Telephone Encounter (Signed)
Please call/notify patient that lab/test/procedure is normal 

## 2013-03-05 NOTE — Telephone Encounter (Signed)
Wife calling for pt for results of labs done 6/11. Pls call when avaiable. OK to leave message.

## 2013-03-19 ENCOUNTER — Telehealth: Payer: Self-pay | Admitting: Internal Medicine

## 2013-03-19 NOTE — Telephone Encounter (Signed)
ED Notification 

## 2013-03-19 NOTE — Telephone Encounter (Signed)
Patient Information:  Caller Name: Gaynelle Adu  Phone: [phone]  Patient: Brett Castaneda  Gender: Male  DOB: 1941-01-23  Age: 72 Years  PCP: Eleonore Chiquito [facility])  Office Follow Up:  Does the office need to follow up with this patient?: No  Instructions For The Office: N/A  RN Note:  Wife states, Pt has had issues w/ dizziness, fatique for couple of months, Pt worsen on 6-26 w/ losing his balance. Advised Wife to have Pt seen at ED due to severe dizziness, feels like passing out when standing.  Pt doesn't want to go to ED now, he agrees to go in the morning of 6-27 if symptoms are not better.  No same day appts remaining.  Symptoms  Reason For Call & Symptoms: Dizziness, Fatigue, Off Balance  Reviewed Health History In EMR: Yes  Reviewed Medications In EMR: Yes  Reviewed Allergies In EMR: Yes  Reviewed Surgeries / Procedures: Yes  Date of Onset of Symptoms: 01/17/2013  Guideline(s) Used:  Dizziness  Disposition Per Guideline:   Go to ED Now (or to Office with PCP Approval)  Reason For Disposition Reached:   Severe dizziness (e.g., unable to stand, requires support to walk, feels like passing out now)  Advice Given:  N/A  Patient Refused Recommendation:  Patient Will Go To ED  Pt will go to ED w/n 24 hrs if symptoms don't improve or call back

## 2013-03-21 ENCOUNTER — Other Ambulatory Visit: Payer: Self-pay | Admitting: Internal Medicine

## 2013-03-26 ENCOUNTER — Encounter: Payer: Self-pay | Admitting: Internal Medicine

## 2013-03-26 ENCOUNTER — Ambulatory Visit (INDEPENDENT_AMBULATORY_CARE_PROVIDER_SITE_OTHER): Payer: Medicare Other | Admitting: Internal Medicine

## 2013-03-26 VITALS — BP 134/88 | HR 68 | Temp 98.5°F | Resp 20 | Wt 180.0 lb

## 2013-03-26 DIAGNOSIS — M069 Rheumatoid arthritis, unspecified: Secondary | ICD-10-CM

## 2013-03-26 DIAGNOSIS — I10 Essential (primary) hypertension: Secondary | ICD-10-CM

## 2013-03-26 DIAGNOSIS — R5381 Other malaise: Secondary | ICD-10-CM

## 2013-03-26 DIAGNOSIS — R5383 Other fatigue: Secondary | ICD-10-CM

## 2013-03-26 DIAGNOSIS — R05 Cough: Secondary | ICD-10-CM

## 2013-03-26 MED ORDER — PREDNISONE 20 MG PO TABS: 20.0000 mg | ORAL_TABLET | Freq: Every day | ORAL | Status: DC

## 2013-03-26 NOTE — Patient Instructions (Signed)
Take over-the-counter expectorants and cough medications such as  Mucinex DM.  Call if there is no improvement in 5 to 7 days or if he developed worsening cough, fever, or new symptoms, such as shortness of breath or chest pain.  Use a nonsedating antihistamine such as Allegra or Zyrtec once daily  Prednisone twice daily for 7 days  Use nasal spray twice daily

## 2013-03-26 NOTE — Progress Notes (Signed)
Subjective:    Patient ID: Brett Castaneda, male    DOB: December 30, 1940, 72 y.o.   MRN: [id number]  HPI  72 year old patient who has a history of rheumatoid arthritis.  He was seen last month with a 2 to three-week history of increasing fatigue and malaise. At that time he gave a history of a tick exposure N/A completed 10 days of doxycycline therapy.  He still feels unwell. Complaints today include low-grade fever sore throat postnasal drip and mild cough. Laboratory update revealed a slightly elevated sedimentation rate a normal white count and other lab was unremarkable allergies include hydrocodone.  Past Medical History  Diagnosis Date  . BASAL CELL CARCINOMA, FACE 03/31/2009  . CEREBROVASCULAR ACCIDENT, HX OF 03/24/2007  . CONSTIPATION 06/22/2008  . DIVERTICULOSIS, COLON 03/24/2007  . GERD 03/24/2007  . HYPERLIPIDEMIA 08/27/2007  . HYPERTENSION 12/14/2008  . Rheumatoid arthritis(714.0) 03/24/2007  . Blind right eye   . Retinal detachment     hx of  . Stroke   . Keratosis Oct. 2013    History   Social History  . Marital Status: Married    Spouse Name: N/A    Number of Children: N/A  . Years of Education: N/A   Occupational History  . Not on file.   Social History Main Topics  . Smoking status: Former Smoker -- 23 years    Types: Cigarettes    Quit date: 09/24/1980  . Smokeless tobacco: Never Used  . Alcohol Use: No  . Drug Use: No  . Sexually Active: Not on file   Other Topics Concern  . Not on file   Social History Narrative  . No narrative on file    Past Surgical History  Procedure Laterality Date  . Total knee arthroplasty      bital  . Eye surgery      retinal detachment  . Carpal tunnel release    . Carotid endarterectomy    . Cataract extraction  1980    removed cataract in left eye. Currently blind in right eye  . Cholecystectomy  2001    Gall Bladder  . Retinal detachment surgery  2004    Family History  Problem Relation Age of Onset  . Stroke Mother  99  . Heart disease Father   . Coronary artery disease Sister   . Heart disease Sister   . Diabetes Sister   . Hypertension Sister     Allergies  Allergen Reactions  . Dopamine Anaphylaxis  . Morphine And Related     hallucination  . Vicodin (Hydrocodone-Acetaminophen)     GI Upset    Current Outpatient Prescriptions on File Prior to Visit  Medication Sig Dispense Refill  . aspirin 325 MG tablet Take 325 mg by mouth daily.       . celecoxib (CELEBREX) 200 MG capsule Take 200 mg by mouth as needed.      . FOLBIC 2.5-25-2 MG TABS take 1 tablet by mouth once daily  90 tablet  1  . latanoprost (XALATAN) 0.005 % ophthalmic solution Place 1 drop into the left eye at bedtime.       . methotrexate (RHEUMATREX) 2.5 MG tablet Take 10 mg by mouth 2 (two) times a week. Sunday and Monday; Caution:Chemotherapy. Protect from light. As directed      . Multiple Vitamins-Minerals (ICAPS) CAPS Take 2 capsules by mouth daily.      Marland Kitchen omeprazole (PRILOSEC) 40 MG capsule take 1 capsule by mouth once daily  90 capsule  3  . polyethylene glycol powder (GLYCOLAX/MIRALAX) powder TAKE 1 SCOOP IN 8 OUNCES OF WATER TWICE A DAY  527 g  3  . timolol (TIMOPTIC) 0.5 % ophthalmic solution Place 1 drop into the left eye daily.        No current facility-administered medications on file prior to visit.    BP 134/88  Pulse 68  Temp(Src) 98.5 F (36.9 C) (Oral)  Resp 20  Wt 180 lb (81.647 kg)  BMI 25.12 kg/m2  SpO2 97%       Review of Systems  Constitutional: Positive for activity change, appetite change and fatigue. Negative for fever and chills.  HENT: Positive for congestion, sore throat, rhinorrhea and postnasal drip. Negative for hearing loss, ear pain, trouble swallowing, neck stiffness, dental problem, voice change and tinnitus.   Eyes: Negative for pain, discharge and visual disturbance.  Respiratory: Positive for cough. Negative for chest tightness, wheezing and stridor.   Cardiovascular:  Negative for chest pain, palpitations and leg swelling.  Gastrointestinal: Negative for nausea, vomiting, abdominal pain, diarrhea, constipation, blood in stool and abdominal distention.  Genitourinary: Negative for urgency, hematuria, flank pain, discharge, difficulty urinating and genital sores.  Musculoskeletal: Negative for myalgias, back pain, joint swelling, arthralgias and gait problem.  Skin: Negative for rash.  Neurological: Negative for dizziness, syncope, speech difficulty, weakness, numbness and headaches.  Hematological: Negative for adenopathy. Does not bruise/bleed easily.  Psychiatric/Behavioral: Negative for behavioral problems and dysphoric mood. The patient is not nervous/anxious.        Objective:   Physical Exam  Constitutional: He is oriented to person, place, and time. He appears well-developed.  HENT:  Head: Normocephalic.  Right Ear: External ear normal.  Left Ear: External ear normal.  Eyes: Conjunctivae and EOM are normal.  Neck: Normal range of motion.  Cardiovascular: Normal rate and normal heart sounds.   Pulmonary/Chest: Effort normal and breath sounds normal. No respiratory distress. He has no wheezes. He has no rales.  Abdominal: Bowel sounds are normal.  Musculoskeletal: Normal range of motion. He exhibits no edema and no tenderness.  Neurological: He is alert and oriented to person, place, and time.  Psychiatric: He has a normal mood and affect. His behavior is normal.          Assessment & Plan:   Malaise sore throat cough. Viral versus allergy flare. Patient has been miserable for the several weeks we'll treat with 7 days of oral prednisone. Additionally will place on nasal steroids and antihistamine therapy. We'll treat symptomatically with tramadol Rheumatoid arthritis Hypertension stable

## 2013-04-09 ENCOUNTER — Observation Stay (HOSPITAL_COMMUNITY)
Admission: EM | Admit: 2013-04-09 | Discharge: 2013-04-10 | Disposition: A | Discharge status: Home / Self Care | Payer: Medicare Other | Attending: Internal Medicine | Admitting: Internal Medicine

## 2013-04-09 ENCOUNTER — Encounter (HOSPITAL_COMMUNITY): Payer: Self-pay

## 2013-04-09 ENCOUNTER — Emergency Department (HOSPITAL_COMMUNITY): Payer: Medicare Other

## 2013-04-09 ENCOUNTER — Inpatient Hospital Stay (HOSPITAL_COMMUNITY): Payer: Medicare Other

## 2013-04-09 DIAGNOSIS — M069 Rheumatoid arthritis, unspecified: Secondary | ICD-10-CM

## 2013-04-09 DIAGNOSIS — Z85828 Personal history of other malignant neoplasm of skin: Secondary | ICD-10-CM | POA: Insufficient documentation

## 2013-04-09 DIAGNOSIS — Z8679 Personal history of other diseases of the circulatory system: Secondary | ICD-10-CM

## 2013-04-09 DIAGNOSIS — Z8249 Family history of ischemic heart disease and other diseases of the circulatory system: Secondary | ICD-10-CM

## 2013-04-09 DIAGNOSIS — Z79899 Other long term (current) drug therapy: Secondary | ICD-10-CM | POA: Insufficient documentation

## 2013-04-09 DIAGNOSIS — E785 Hyperlipidemia, unspecified: Secondary | ICD-10-CM

## 2013-04-09 DIAGNOSIS — K5730 Diverticulosis of large intestine without perforation or abscess without bleeding: Secondary | ICD-10-CM

## 2013-04-09 DIAGNOSIS — I714 Abdominal aortic aneurysm, without rupture, unspecified: Secondary | ICD-10-CM | POA: Insufficient documentation

## 2013-04-09 DIAGNOSIS — Z8673 Personal history of transient ischemic attack (TIA), and cerebral infarction without residual deficits: Secondary | ICD-10-CM | POA: Insufficient documentation

## 2013-04-09 DIAGNOSIS — I639 Cerebral infarction, unspecified: Secondary | ICD-10-CM

## 2013-04-09 DIAGNOSIS — I10 Essential (primary) hypertension: Secondary | ICD-10-CM

## 2013-04-09 DIAGNOSIS — J189 Pneumonia, unspecified organism: Principal | ICD-10-CM

## 2013-04-09 DIAGNOSIS — I6350 Cerebral infarction due to unspecified occlusion or stenosis of unspecified cerebral artery: Secondary | ICD-10-CM

## 2013-04-09 DIAGNOSIS — R079 Chest pain, unspecified: Secondary | ICD-10-CM

## 2013-04-09 DIAGNOSIS — K219 Gastro-esophageal reflux disease without esophagitis: Secondary | ICD-10-CM

## 2013-04-09 DIAGNOSIS — C44309 Unspecified malignant neoplasm of skin of other parts of face: Secondary | ICD-10-CM

## 2013-04-09 DIAGNOSIS — I6529 Occlusion and stenosis of unspecified carotid artery: Secondary | ICD-10-CM | POA: Diagnosis present

## 2013-04-09 DIAGNOSIS — K5900 Constipation, unspecified: Secondary | ICD-10-CM

## 2013-04-09 LAB — BASIC METABOLIC PANEL
Calcium: 9.1 mg/dL (ref 8.4–10.5)
GFR calc Af Amer: 80 mL/min — ABNORMAL LOW (ref >90)
GFR calc non Af Amer: 69 mL/min — ABNORMAL LOW (ref >90)
Potassium: 3.5 mEq/L (ref 3.5–5.1)
Sodium: 136 mEq/L (ref 135–145)

## 2013-04-09 LAB — CBC
Hemoglobin: 14.3 g/dL (ref 13.0–17.0)
Hemoglobin: 12.7 g/dL — ABNORMAL LOW (ref 13.0–17.0)
MCH: 33.9 pg (ref 26.0–34.0)
MCHC: 35.5 g/dL (ref 30.0–36.0)
MCHC: 35.2 g/dL (ref 30.0–36.0)
Platelets: 210 10*3/uL (ref 150–400)
Platelets: 188 10*3/uL (ref 150–400)
RBC: 3.78 MIL/uL — ABNORMAL LOW (ref 4.22–5.81)
RDW: 15.1 % (ref 11.5–15.5)

## 2013-04-09 LAB — CREATININE, SERUM
Creatinine, Ser: 1.05 mg/dL (ref 0.50–1.35)
GFR calc non Af Amer: 70 mL/min — ABNORMAL LOW (ref >90)

## 2013-04-09 LAB — POCT I-STAT TROPONIN I
Troponin i, poc: 0.01 ng/mL (ref 0.00–0.08)
Troponin i, poc: 0 ng/mL (ref 0.00–0.08)

## 2013-04-09 LAB — URINALYSIS, ROUTINE W REFLEX MICROSCOPIC
Nitrite: NEGATIVE
Protein, ur: NEGATIVE mg/dL
Specific Gravity, Urine: 1.018 (ref 1.005–1.030)
Urobilinogen, UA: 0.2 mg/dL (ref 0.0–1.0)

## 2013-04-09 LAB — PROTIME-INR: INR: 1.1 (ref 0.00–1.49)

## 2013-04-09 MED ORDER — METHOTREXATE 2.5 MG PO TABS: 10.0000 mg | ORAL_TABLET | ORAL | Status: DC

## 2013-04-09 MED ORDER — FA-PYRIDOXINE-CYANOCOBALAMIN 2.5-25-2 MG PO TABS
1.0000 | ORAL_TABLET | Freq: Every day | ORAL | Status: DC
  Administered 2013-04-09 – 2013-04-10 (×2): 1 via ORAL
  Filled 2013-04-09 (×2): qty 1

## 2013-04-09 MED ORDER — DIPHENHYDRAMINE HCL 25 MG PO TABS
25.0000 mg | ORAL_TABLET | Freq: Four times a day (QID) | ORAL | Status: DC | PRN
  Filled 2013-04-09: qty 1

## 2013-04-09 MED ORDER — ONDANSETRON HCL 4 MG/2ML IJ SOLN
4.0000 mg | Freq: Four times a day (QID) | INTRAMUSCULAR | Status: DC | PRN
  Administered 2013-04-09: 4 mg via INTRAVENOUS
  Filled 2013-04-09: qty 2

## 2013-04-09 MED ORDER — ASPIRIN 325 MG PO TABS
325.0000 mg | ORAL_TABLET | Freq: Every day | ORAL | Status: DC
  Administered 2013-04-09 – 2013-04-10 (×2): 325 mg via ORAL
  Filled 2013-04-09 (×2): qty 1

## 2013-04-09 MED ORDER — GUAIFENESIN-DM 100-10 MG/5ML PO SYRP: 5.0000 mL | ORAL_SOLUTION | ORAL | Status: DC | PRN

## 2013-04-09 MED ORDER — ALBUTEROL SULFATE (5 MG/ML) 0.5% IN NEBU: 2.5000 mg | INHALATION_SOLUTION | RESPIRATORY_TRACT | Status: DC | PRN

## 2013-04-09 MED ORDER — ACETAMINOPHEN 325 MG PO TABS: 650.0000 mg | ORAL_TABLET | Freq: Four times a day (QID) | ORAL | Status: DC | PRN

## 2013-04-09 MED ORDER — ONDANSETRON HCL 4 MG PO TABS: 4.0000 mg | ORAL_TABLET | Freq: Four times a day (QID) | ORAL | Status: DC | PRN

## 2013-04-09 MED ORDER — METOPROLOL TARTRATE 25 MG PO TABS
25.0000 mg | ORAL_TABLET | Freq: Two times a day (BID) | ORAL | Status: DC
  Administered 2013-04-10: 25 mg via ORAL
  Filled 2013-04-09 (×2): qty 1

## 2013-04-09 MED ORDER — HEPARIN SODIUM (PORCINE) 5000 UNIT/ML IJ SOLN
5000.0000 [IU] | Freq: Three times a day (TID) | INTRAMUSCULAR | Status: DC
  Administered 2013-04-09 – 2013-04-10 (×3): 5000 [IU] via SUBCUTANEOUS
  Filled 2013-04-09 (×6): qty 1

## 2013-04-09 MED ORDER — PIPERACILLIN-TAZOBACTAM 3.375 G IVPB
3.3750 g | Freq: Once | INTRAVENOUS | Status: AC
  Administered 2013-04-09: 3.375 g via INTRAVENOUS
  Filled 2013-04-09: qty 50

## 2013-04-09 MED ORDER — LATANOPROST 0.005 % OP SOLN
1.0000 [drp] | Freq: Every day | OPHTHALMIC | Status: DC
  Filled 2013-04-09: qty 2.5

## 2013-04-09 MED ORDER — LEVOFLOXACIN IN D5W 750 MG/150ML IV SOLN: 750.0000 mg | INTRAVENOUS | Status: DC

## 2013-04-09 MED ORDER — DEXTROSE 5 % IV SOLN
500.0000 mg | INTRAVENOUS | Status: DC
  Administered 2013-04-09: 500 mg via INTRAVENOUS
  Filled 2013-04-09 (×2): qty 500

## 2013-04-09 MED ORDER — ONDANSETRON HCL 4 MG/2ML IJ SOLN
4.0000 mg | Freq: Once | INTRAMUSCULAR | Status: AC
  Administered 2013-04-09: 4 mg via INTRAVENOUS
  Filled 2013-04-09: qty 2

## 2013-04-09 MED ORDER — ZOLPIDEM TARTRATE 5 MG PO TABS: 5.0000 mg | ORAL_TABLET | Freq: Every evening | ORAL | Status: DC | PRN

## 2013-04-09 MED ORDER — POLYETHYLENE GLYCOL 3350 17 G PO PACK
17.0000 g | PACK | Freq: Every day | ORAL | Status: DC | PRN
  Filled 2013-04-09: qty 1

## 2013-04-09 MED ORDER — ASPIRIN 325 MG PO TABS
325.0000 mg | ORAL_TABLET | ORAL | Status: AC
  Administered 2013-04-09: 325 mg via ORAL
  Filled 2013-04-09: qty 1

## 2013-04-09 MED ORDER — PIPERACILLIN-TAZOBACTAM 3.375 G IVPB
3.3750 g | Freq: Three times a day (TID) | INTRAVENOUS | Status: DC
  Administered 2013-04-10 (×2): 3.375 g via INTRAVENOUS
  Filled 2013-04-09 (×5): qty 50

## 2013-04-09 MED ORDER — ACETAMINOPHEN 650 MG RE SUPP: 650.0000 mg | Freq: Four times a day (QID) | RECTAL | Status: DC | PRN

## 2013-04-09 MED ORDER — TIMOLOL MALEATE 0.5 % OP SOLN
1.0000 [drp] | Freq: Every day | OPHTHALMIC | Status: DC
  Filled 2013-04-09: qty 5

## 2013-04-09 MED ORDER — SODIUM CHLORIDE 0.9 % IJ SOLN: 3.0000 mL | Freq: Two times a day (BID) | INTRAMUSCULAR | Status: DC

## 2013-04-09 MED ORDER — PANTOPRAZOLE SODIUM 40 MG PO TBEC
80.0000 mg | DELAYED_RELEASE_TABLET | Freq: Every day | ORAL | Status: DC
  Filled 2013-04-09 (×2): qty 1
  Filled 2013-04-09: qty 2

## 2013-04-09 MED ORDER — PREDNISONE 20 MG PO TABS
20.0000 mg | ORAL_TABLET | Freq: Every day | ORAL | Status: DC
  Filled 2013-04-09 (×2): qty 1

## 2013-04-09 MED ORDER — ALUM & MAG HYDROXIDE-SIMETH 200-200-20 MG/5ML PO SUSP: 30.0000 mL | Freq: Four times a day (QID) | ORAL | Status: DC | PRN

## 2013-04-09 MED ORDER — SODIUM CHLORIDE 0.9 % IV SOLN: INTRAVENOUS | Status: DC

## 2013-04-09 NOTE — ED Notes (Signed)
Pts CBG was 121 reported to nurse Irving Burton

## 2013-04-09 NOTE — ED Provider Notes (Signed)
I saw and evaluated the patient, reviewed the resident's note and I agree with the findings and plan. Subjective: Patient here complaining of chest pain with associated diaphoresis. Also notes cough and recently just finished a long course of steroids.  Objective: Patient afebrile but mildly tachycardic. Decreased breath sounds bilaterally.  Assessment/plan: Chest x-ray consistent with pneumonia will start treatment for community acquired pneumonia as well as a minute for ACS    Toy Baker, MD 04/09/13 1759

## 2013-04-09 NOTE — Progress Notes (Signed)
ANTIBIOTIC CONSULT NOTE - INITIAL  Pharmacy Consult for Zosyn Indication: pneumonia  Allergies  Allergen Reactions  . Dopamine Anaphylaxis  . Morphine And Related Other (See Comments)    Hallucinations, very bad reactions  . Vicodin (Hydrocodone-Acetaminophen) Nausea And Vomiting   Vital Signs: Temp: 98.3 F (36.8 C) (07/17 1614) Temp src: Oral (07/17 1614) BP: 154/83 mmHg (07/17 1700) Pulse Rate: 72 (07/17 1700)  Labs:  Recent Labs  04/09/13 1619  WBC 13.5*  HGB 14.3  PLT 210  CREATININE 1.06    Medical History: Past Medical History  Diagnosis Date  . BASAL CELL CARCINOMA, FACE 03/31/2009  . CEREBROVASCULAR ACCIDENT, HX OF 03/24/2007  . CONSTIPATION 06/22/2008  . DIVERTICULOSIS, COLON 03/24/2007  . GERD 03/24/2007  . HYPERLIPIDEMIA 08/27/2007  . HYPERTENSION 12/14/2008  . Rheumatoid arthritis(714.0) 03/24/2007  . Blind right eye   . Retinal detachment     hx of  . Stroke   . Keratosis Oct. 2013   Assessment: 72 y/o M presents to ED with N/V, CP, lightheadedness. CXR reveals patchy bibasilar densities; patient takes methotrexate PTA and appears to be finishing up a course of prednisone as well. WBC 13.5, Scr 1.06, unremarkable UA, Tmax 98.3.   ED Antibiotics  -Zosyn 3.375 g IV x 1 ordered -Azithromycin 500 mg IV x 1 ordered  7/17 Blood x 2 >> 7/17 Sputum >>  Goal of Therapy:  Eradication of infection  Plan:  -Zosyn 3.375G IV q8h to be infused over 4 hours -Trend WBC, temp, renal function -F/U radiologic/micro data -F/U change to PO when indicated  Thank you for allowing me to take part in this patient's care,  Abran Duke, PharmD Clinical Pharmacist Phone: [phone] Pager: [phone] 04/09/2013 6:29 PM

## 2013-04-09 NOTE — ED Notes (Signed)
Pt. Developed chest pain 15 minutes ago, he was riding in the truck.  He became diphoretic and vomited .  Pt.is very pale and sweaty.  Chest pain is non-radiaiting, center of chest, and is pressure like

## 2013-04-09 NOTE — Consult Note (Signed)
Reason for Consult: "chest pain"  Requesting Physician: [facility]  HPI: This is a 72 y.o. male with a past medical history significant for PVD. 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 Postop he had more embolic Lt strokes but eventually recovered. He denies any prior cardiac history. He is admitted now with pneumonia. We are asked to see him for chest pain. The pat denies any histroy of chest pain or tightness to me. He did have a "throbbing" in his lower back that he felt was his heart beat. He denies any tachycardia.  PMHx:  Past Medical History  Diagnosis Date  . BASAL CELL CARCINOMA, FACE 03/31/2009  . CEREBROVASCULAR ACCIDENT, HX OF 03/24/2007  . CONSTIPATION 06/22/2008  . DIVERTICULOSIS, COLON 03/24/2007  . GERD 03/24/2007  . HYPERLIPIDEMIA 08/27/2007  . HYPERTENSION 12/14/2008  . Rheumatoid arthritis(714.0) 03/24/2007  . Blind right eye   . Retinal detachment     hx of  . Stroke   . Keratosis Oct. 2013   Past Surgical History  Procedure Laterality Date  . Total knee arthroplasty      bital  . Eye surgery      retinal detachment  . Carpal tunnel release    . Carotid endarterectomy    . Cataract extraction  1980    removed cataract in left eye. Currently blind in right eye  . Cholecystectomy  2001    Gall Bladder  . Retinal detachment surgery  2004    FAMHx: Family History  Problem Relation Age of Onset  . Stroke Mother 22  . Heart disease Father   . Coronary artery disease Sister   . Heart disease Sister   . Diabetes Sister   . Hypertension Sister     SOCHx:  reports that he quit smoking about 32 years ago. His smoking use included Cigarettes. He smoked 0.00 packs per day for 23 years. He has never used smokeless tobacco. He reports that he does not drink alcohol or use illicit drugs.  ALLERGIES: Allergies   Allergen Reactions  . Dopamine Anaphylaxis  . Morphine And Related Other (See Comments)    Hallucinations, very bad reactions  . Vicodin (Hydrocodone-Acetaminophen) Nausea And Vomiting    ROS: Pertinent items are noted in HPI.  HOME MEDICATIONS:  (Not in a hospital admission)  HOSPITAL MEDICATIONS: I have reviewed the patient's current medications.  VITALS: Blood pressure 154/83, pulse 72, temperature 98.3 F (36.8 C), temperature source Oral, resp. rate 19, SpO2 98.00%.  PHYSICAL EXAM: General appearance: alert, cooperative, appears older than stated age and no distress Neck: no JVD and RCE scar with soft RCA bruit Lungs: clear to auscultation bilaterally Heart: regular rate and rhythm Abdomen: soft, non-tender; bowel sounds normal; no masses,  no organomegaly Extremities: extremities normal, atraumatic, no cyanosis or edema Pulses: 2+ and symmetric Skin: Skin color, texture, turgor normal. No rashes or lesions Neurologic: Grossly normal  LABS: Results for orders placed during the hospital encounter of 04/09/13 (from the past 48 hour(s))  CBC     Status: Abnormal   Collection Time    04/09/13  4:19 PM      Result Value Range   WBC 13.5 (*) 4.0 - 10.5 K/uL   RBC 4.22  4.22 - 5.81 MIL/uL   Hemoglobin 14.3  13.0 - 17.0 g/dL   HCT 11.9  14.7 -  52.0 %   MCV 95.5  78.0 - 100.0 fL   MCH 33.9  26.0 - 34.0 pg   MCHC 35.5  30.0 - 36.0 g/dL   RDW 16.1  09.6 - 04.5 %   Platelets 210  150 - 400 K/uL  BASIC METABOLIC PANEL     Status: Abnormal   Collection Time    04/09/13  4:19 PM      Result Value Range   Sodium 136  135 - 145 mEq/L   Potassium 3.5  3.5 - 5.1 mEq/L   Chloride 99  96 - 112 mEq/L   CO2 26  19 - 32 mEq/L   Glucose, Bld 134 (*) 70 - 99 mg/dL   BUN 20  6 - 23 mg/dL   Creatinine, Ser 4.09  0.50 - 1.35 mg/dL   Calcium 9.1  8.4 - 81.1 mg/dL   GFR calc non Af Amer 69 (*) >90 mL/min   GFR calc Af Amer 80 (*) >90 mL/min   Comment:            The eGFR has  been calculated     using the CKD EPI equation.     This calculation has not been     validated in all clinical     situations.     eGFR's persistently     <90 mL/min signify     possible Chronic Kidney Disease.  POCT I-STAT TROPONIN I     Status: None   Collection Time    04/09/13  5:01 PM      Result Value Range   Troponin i, poc 0.01  0.00 - 0.08 ng/mL   Comment 3            Comment: Due to the release kinetics of cTnI,     a negative result within the first hours     of the onset of symptoms does not rule out     myocardial infarction with certainty.     If myocardial infarction is still suspected,     repeat the test at appropriate intervals.  URINALYSIS, ROUTINE W REFLEX MICROSCOPIC     Status: None   Collection Time    04/09/13  5:15 PM      Result Value Range   Color, Urine YELLOW  YELLOW   APPearance CLEAR  CLEAR   Specific Gravity, Urine 1.018  1.005 - 1.030   pH 7.0  5.0 - 8.0   Glucose, UA NEGATIVE  NEGATIVE mg/dL   Hgb urine dipstick NEGATIVE  NEGATIVE   Bilirubin Urine NEGATIVE  NEGATIVE   Ketones, ur NEGATIVE  NEGATIVE mg/dL   Protein, ur NEGATIVE  NEGATIVE mg/dL   Urobilinogen, UA 0.2  0.0 - 1.0 mg/dL   Nitrite NEGATIVE  NEGATIVE   Leukocytes, UA NEGATIVE  NEGATIVE   Comment: MICROSCOPIC NOT DONE ON URINES WITH NEGATIVE PROTEIN, BLOOD, LEUKOCYTES, NITRITE, OR GLUCOSE <1000 mg/dL.    EKG: NSR/NSST changes  IMAGING: Dg Chest 2 View  04/09/2013   *RADIOLOGY REPORT*  Clinical Data: Chest pressure and congestion.  CHEST - 2 VIEW  Comparison: 03/07/2012 and 08/15/2007  Findings: Two views of the chest were obtained.  Patchy densities at the lung bases, left side greater than right.  Findings are similar to the previous examination.  Upper lungs are clear.  Heart size is within normal limits.  No definite pleural effusions.  IMPRESSION: Bibasilar patchy densities, left side greater than right.  There is likely a chronic component but  cannot exclude acute-on-chronic  disease, particularly at the left lung base.   Original Report Authenticated By: Richarda Overlie, M.D.      IMPRESSION: Principal Problem:   CAP (community acquired pneumonia) Active Problems:   Chest pain- really back discomfort- sounds atypical for angina   HYPERTENSION   RCE 11/08 complicated by post op CVA   Malignant neoplasm of skin of parts of face   HYPERLIPIDEMIA   GERD   DIVERTICULOSIS, COLON   CONSTIPATION   Rheumatoid arthritis(714.0)   CEREBROVASCULAR ACCIDENT, HX OF   RECOMMENDATION: MD to see. Consider an echo before discharge and possibly an OP Myoview.  Time Spent Directly with Patient: 45  minutes  Abelino Derrick [phone] beeper 04/09/2013, 7:01 PM

## 2013-04-09 NOTE — ED Notes (Signed)
Completed orthostatic vital signs pt was able to stand with some assistance and stated that he felt fine.

## 2013-04-09 NOTE — H&P (Signed)
[facility]                                                                                    Patient Demographics  Brett Castaneda, is a 72 y.o. male  CSN: [id number]  MRN: [id number]  DOB - 06-05-1941  Admit Date - 04/09/2013  Outpatient Primary MD for the patient is Rogelia Boga, MD   With History of -  Past Medical History  Diagnosis Date  . BASAL CELL CARCINOMA, FACE 03/31/2009  . CEREBROVASCULAR ACCIDENT, HX OF 03/24/2007  . CONSTIPATION 06/22/2008  . DIVERTICULOSIS, COLON 03/24/2007  . GERD 03/24/2007  . HYPERLIPIDEMIA 08/27/2007  . HYPERTENSION 12/14/2008  . Rheumatoid arthritis(714.0) 03/24/2007  . Blind right eye   . Retinal detachment     hx of  . Stroke   . Keratosis Oct. 2013      Past Surgical History  Procedure Laterality Date  . Total knee arthroplasty      bital  . Eye surgery      retinal detachment  . Carpal tunnel release    . Carotid endarterectomy    . Cataract extraction  1980    removed cataract in left eye. Currently blind in right eye  . Cholecystectomy  2001    Gall Bladder  . Retinal detachment surgery  2004    in for   Chief Complaint  Patient presents with  . Chest Pain     HPI  Brett Castaneda  is a 72 y.o. male,  with history of rheumatoid arthritis who is been on methotrexate and intermittent steroids for a while, dyslipidemia, hypertension, diverticulosis, CVA in the past from which he has recovered completely, whose been having some cough and congestion for the last 5-7 days, he was in his car which was driven by his wife this afternoon when he started to feel lightheaded and weak, when he's tried to get out of the car he got nauseated and threw up, at that point he had substernal chest pain along with some sweating and shortness of breath, pain did not radiate and lasted about 15 minutes, pain went away by itself. He says he intermittently has a throbbing feeling in both flanks which has been going on for the last few years,  she had this feeling for a few minutes also but it all resolved.   He was brought to the ER where his workup was suggested of community-acquired pneumonia with mild leukocytosis, EKG and cardiac enzymes were unremarkable. Patient already feels somewhat better. I was called to admit the patient.    Review of Systems    In addition to the HPI above,  No Fever-chills, No Headache, No changes with Vision or hearing, No problems swallowing food or Liquids, No Chest pain, Cough or Shortness of Breath, No Abdominal pain, No Nausea or Vommitting, Bowel movements are regular, No Blood in stool or Urine, No dysuria, No new skin rashes or bruises, No new joints pains-aches,  No new weakness, tingling, numbness in any extremity, mild generalized weakness but feels better already No recent weight gain or loss, No polyuria, polydypsia or polyphagia, No significant Mental Stressors.  A full 10 point Review of  Systems was done, except as stated above, all other Review of Systems were negative.   Social History History  Substance Use Topics  . Smoking status: Former Smoker -- 23 years    Types: Cigarettes    Quit date: 09/24/1980  . Smokeless tobacco: Never Used  . Alcohol Use: No      Family History Family History  Problem Relation Age of Onset  . Stroke Mother 40  . Heart disease Father   . Coronary artery disease Sister   . Heart disease Sister   . Diabetes Sister   . Hypertension Sister       Prior to Admission medications   Medication Sig Start Date End Date Taking? Authorizing Provider  aspirin 325 MG tablet Take 325 mg by mouth daily.    Yes Historical Provider, MD  chlorpheniramine (CHLOR-TRIMETON) 4 MG tablet Take 4 mg by mouth daily as needed for allergies.   Yes Historical Provider, MD  FOLBIC 2.5-25-2 MG TABS take 1 tablet by mouth once daily 03/28/12  Yes Gordy Savers, MD  latanoprost (XALATAN) 0.005 % ophthalmic solution Place 1 drop into the left eye at  bedtime.    Yes Historical Provider, MD  methotrexate (RHEUMATREX) 2.5 MG tablet Take 10 mg by mouth 2 (two) times a week. Saturday and Sunday; Caution:Chemotherapy. Protect from light.   Yes Historical Provider, MD  omeprazole (PRILOSEC) 40 MG capsule take 1 capsule by mouth once daily 12/18/12  Yes Gordy Savers, MD  polyethylene glycol [facility] Ethelene Hal) packet Take 17 g by mouth daily as needed (for constipation).   Yes Historical Provider, MD  predniSONE (DELTASONE) 20 MG tablet Take 1 tablet (20 mg total) by mouth daily. 03/26/13  Yes Gordy Savers, MD  timolol (TIMOPTIC) 0.5 % ophthalmic solution Place 1 drop into the left eye daily.    Yes Historical Provider, MD    Allergies  Allergen Reactions  . Dopamine Anaphylaxis  . Morphine And Related Other (See Comments)    Hallucinations, very bad reactions  . Vicodin (Hydrocodone-Acetaminophen) Nausea And Vomiting    Physical Exam  Vitals  Blood pressure 154/83, pulse 72, temperature 98.3 F (36.8 C), temperature source Oral, resp. rate 19, SpO2 98.00%.   1. General elderly white male lying in bed in NAD,     2. Normal affect and insight, Not Suicidal or Homicidal, Awake Alert, Oriented X 3.  3. No F.N deficits, ALL C.Nerves Intact, Strength 5/5 all 4 extremities, Sensation intact all 4 extremities, Plantars down going.  4. Ears and Eyes appear Normal, Conjunctivae clear, PERRLA. Moist Oral Mucosa.  5. Supple Neck, No JVD, No cervical lymphadenopathy appriciated, No Carotid Bruits.  6. Symmetrical Chest wall movement, Good air movement bilaterally, few rales  7. RRR, No Gallops, Rubs or Murmurs, No Parasternal Heave.  8. Positive Bowel Sounds, Abdomen Soft, Non tender, No organomegaly appriciated,No rebound -guarding or rigidity.  9.  No Cyanosis, Normal Skin Turgor, No Skin Rash or Bruise.  10. Good muscle tone,  joints appear normal , no effusions, Normal ROM.  11. No Palpable Lymph Nodes in Neck or  Axillae     Data Review  CBC  Recent Labs Lab 04/09/13 1619  WBC 13.5*  HGB 14.3  HCT 40.3  PLT 210  MCV 95.5  MCH 33.9  MCHC 35.5  RDW 15.1   ------------------------------------------------------------------------------------------------------------------  Chemistries   Recent Labs Lab 04/09/13 1619  NA 136  K 3.5  CL 99  CO2 26  GLUCOSE 134*  BUN 20  CREATININE 1.06  CALCIUM 9.1   ------------------------------------------------------------------------------------------------------------------ CrCl is unknown because both a height and weight (above a minimum accepted value) are required for this calculation. ------------------------------------------------------------------------------------------------------------------ No results found for this basename: TSH, T4TOTAL, FREET3, T3FREE, THYROIDAB,  in the last 72 hours   Coagulation profile No results found for this basename: INR, PROTIME,  in the last 168 hours ------------------------------------------------------------------------------------------------------------------- No results found for this basename: DDIMER,  in the last 72 hours -------------------------------------------------------------------------------------------------------------------  Cardiac Enzymes No results found for this basename: CK, CKMB, TROPONINI, MYOGLOBIN,  in the last 168 hours ------------------------------------------------------------------------------------------------------------------ No components found with this basename: POCBNP,    ---------------------------------------------------------------------------------------------------------------  Urinalysis    Component Value Date/Time   COLORURINE YELLOW 04/09/2013 1715   APPEARANCEUR CLEAR 04/09/2013 1715   LABSPEC 1.018 04/09/2013 1715   PHURINE 7.0 04/09/2013 1715   GLUCOSEU NEGATIVE 04/09/2013 1715   HGBUR NEGATIVE 04/09/2013 1715   HGBUR negative 02/23/2010 0756    BILIRUBINUR NEGATIVE 04/09/2013 1715   BILIRUBINUR neg 03/07/2012 1211   KETONESUR NEGATIVE 04/09/2013 1715   PROTEINUR NEGATIVE 04/09/2013 1715   UROBILINOGEN 0.2 04/09/2013 1715   UROBILINOGEN 0.2 03/07/2012 1211   NITRITE NEGATIVE 04/09/2013 1715   NITRITE neg 03/07/2012 1211   LEUKOCYTESUR NEGATIVE 04/09/2013 1715    ----------------------------------------------------------------------------------------------------------------  Imaging results:   Dg Chest 2 View  04/09/2013   *RADIOLOGY REPORT*  Clinical Data: Chest pressure and congestion.  CHEST - 2 VIEW  Comparison: 03/07/2012 and 08/15/2007  Findings: Two views of the chest were obtained.  Patchy densities at the lung bases, left side greater than right.  Findings are similar to the previous examination.  Upper lungs are clear.  Heart size is within normal limits.  No definite pleural effusions.  IMPRESSION: Bibasilar patchy densities, left side greater than right.  There is likely a chronic component but cannot exclude acute-on-chronic disease, particularly at the left lung base.   Original Report Authenticated By: Richarda Overlie, M.D.    My personal review of EKG: Rhythm NSR,   no Acute ST changes    Assessment & Plan  1. CAP - will be admitted to telemetry bed, sputum and blood cultures, since there is nausea vomiting involved we'll place him on Zosyn along with azithromycin. Nebulizer treatments and oxygen as needed. Follow culture results.   2. Chest pain lightheadedness, dizziness and sweating - EKG and troponin are stable, however he will be ruled out for MI with serial troponins, we'll continue home dose aspirin, since blood pressure is high we'll add low-dose beta blocker, cardiology to evaluate. Will check echogram also to rule out wall motion abnormalities.   3. History of rheumatoid arthritis on methotrexate and prednisone will be continued.    4. Throbbing sensation and flank areas which has been ongoing for the last few  years. Currently completely resolved. He has history of aneurysm in the family, will check abdominal vascular ultrasound to rule out AAA. Thereafter outpatient followup. He has good bilateral pedal pulses on exam. No abdominal pain whatsoever at this time. Never had any pain the sensation was throbbing in nature.   5. GERD. Continue PPI.       DVT Prophylaxis Heparin    AM Labs Ordered, also please review Full Orders  Family Communication: Admission, patients condition and plan of care including tests being ordered have been discussed with the patient and wife who indicate understanding and agree with the plan and Code Status.  Code Status full  Likely DC to  home  Time spent in minutes : 35  Condition Marinell Blight K M.D on 04/09/2013 at 6:35 PM  Between 7am to 7pm - Pager - [phone]  After 7pm go to [email or web address] - password [email or web address]  And look for the night coverage person covering me after hours  [facility] Office  [phone]

## 2013-04-09 NOTE — Consult Note (Signed)
Pt. Seen and examined. Agree with the NP/PA-C note as written.  Pleasant 72 yo male with a history of stroke in 2008, rheumatoid arthritis, retinal detachment, HTN, HPL and family history of CAD. He has reported progressive weakness and shortness of breath, which has been attributed to RA flares by his PCP - for which he gets steroids. He now presents with severe weakness, chills, cough and congestion. He did have some substernal chest pain (AFTER VOMITING) and some throbbing flank pain. Both of which have resolved. Neither sounds particularly cardiac in etiology.  He does, however, have multiple risk factors for CAD. His story of progressive weakness and dyspnea with exertion does sound concerning for CAD.  EKG is not concerning for ischemia.  Impression: 1. Atypical chest pain, probably related to PNA 2. Numerous cardiovascular equivalent risk factors (including stroke, HTN, HPL and RA)  Plan: 1. Agree with cycling cardiac enzymes. If negative, I would recommend outpatient stress testing in our office.  Thanks for the consult.  Chrystie Nose, MD, [facility] Attending Cardiologist [facility]

## 2013-04-09 NOTE — ED Notes (Signed)
C/o sudden onset lightheadedness, dizziness & "felt faint". Family reports pt became diaphoretic, n/v, emesis x3. After he started vomiting, then started to have midsternal CP & flet "throbbing" to bilateral flank areas. States CP lasted only 15 min which subsided when vomiting stopped.  Presently denies CP, bilateral flank throbbing but dizziness & nausea continues.  States dizziness worsens with head mvmt & when eyes are opened & he tries to focus on anything. Dizziness better when he is still & keeps eyes closed. Denies fever, cold, cough, abd pain. Pt is legally blind & has been having problems with increased pressure in his eyes (saw MD yesterday).

## 2013-04-09 NOTE — ED Notes (Signed)
Unit RN notified phlebotomy at bedside to draw labs

## 2013-04-09 NOTE — ED Notes (Signed)
Patient transported to X-ray 

## 2013-04-09 NOTE — ED Provider Notes (Signed)
History    CSN: [id number] Arrival date & time 04/09/13  1607  First MD Initiated Contact with Patient 04/09/13 1629     Chief Complaint  Patient presents with  . Chest Pain   (Consider location/radiation/quality/duration/timing/severity/associated sxs/prior Treatment) Patient is a 72 y.o. male presenting with neurologic complaint. The history is provided by the patient and the spouse.  Neurologic Problem This is a new (lightheadedness) problem. The current episode started today ("this morning"). The problem occurs constantly. The problem has been unchanged. Associated symptoms include chest pain (Onset and patient was on his way to the hospital, lasted for approximately 15 minutes, has since resolved, pressure-like in character, 5/10 when present, no known exacerbating or relieving factors), diaphoresis, nausea, vomiting and weakness. Pertinent negatives include no abdominal pain, anorexia, change in bowel habit, chills, congestion, coughing, fatigue, fever, headaches, joint swelling, myalgias, neck pain, numbness, rash, sore throat, swollen glands, urinary symptoms, vertigo or visual change. Nothing aggravates the symptoms. He has tried nothing for the symptoms.   Past Medical History  Diagnosis Date  . BASAL CELL CARCINOMA, FACE 03/31/2009  . CEREBROVASCULAR ACCIDENT, HX OF 03/24/2007  . CONSTIPATION 06/22/2008  . DIVERTICULOSIS, COLON 03/24/2007  . GERD 03/24/2007  . HYPERLIPIDEMIA 08/27/2007  . HYPERTENSION 12/14/2008  . Rheumatoid arthritis(714.0) 03/24/2007  . Blind right eye   . Retinal detachment     hx of  . Stroke   . Keratosis Oct. 2013   Past Surgical History  Procedure Laterality Date  . Total knee arthroplasty      bital  . Eye surgery      retinal detachment  . Carpal tunnel release    . Carotid endarterectomy    . Cataract extraction  1980    removed cataract in left eye. Currently blind in right eye  . Cholecystectomy  2001    Gall Bladder  . Retinal detachment  surgery  2004   Family History  Problem Relation Age of Onset  . Stroke Mother 70  . Heart disease Father   . Coronary artery disease Sister   . Heart disease Sister   . Diabetes Sister   . Hypertension Sister    History  Substance Use Topics  . Smoking status: Former Smoker -- 23 years    Types: Cigarettes    Quit date: 09/24/1980  . Smokeless tobacco: Never Used  . Alcohol Use: No    Review of Systems  Constitutional: Positive for diaphoresis. Negative for fever, chills, activity change, appetite change and fatigue.  HENT: Negative for congestion, sore throat, rhinorrhea, sneezing, trouble swallowing and neck pain.   Eyes: Negative for pain and redness.  Respiratory: Positive for chest tightness. Negative for cough, choking, shortness of breath, wheezing and stridor.   Cardiovascular: Positive for chest pain (Onset and patient was on his way to the hospital, lasted for approximately 15 minutes, has since resolved, pressure-like in character, 5/10 when present, no known exacerbating or relieving factors). Negative for leg swelling.  Gastrointestinal: Positive for nausea and vomiting. Negative for abdominal pain, diarrhea, constipation, blood in stool, abdominal distention, anal bleeding, anorexia and change in bowel habit.  Musculoskeletal: Negative for myalgias, back pain and joint swelling.  Skin: Negative for rash.  Neurological: Positive for weakness. Negative for dizziness, vertigo, speech difficulty, light-headedness, numbness and headaches.  Hematological: Negative for adenopathy.  Psychiatric/Behavioral: Negative for confusion.    Allergies  Dopamine; Morphine and related; and Vicodin  Home Medications   No current outpatient prescriptions on file. BP 148/90  Pulse 71  Temp(Src) 97.8 F (36.6 C) (Oral)  Resp 16  Ht 5\' 11"  (1.803 m)  Wt 177 lb (80.287 kg)  BMI 24.7 kg/m2  SpO2 99% Physical Exam  Nursing note and vitals reviewed. Constitutional: He is  oriented to person, place, and time. He appears well-developed and well-nourished.  Non-toxic appearance. He appears ill. He appears distressed.  HENT:  Head: Normocephalic and atraumatic.  Eyes: Conjunctivae and EOM are normal. Right eye exhibits no discharge. Left eye exhibits no discharge.  Neck: Normal range of motion. Neck supple. No tracheal deviation present.  Cardiovascular: Normal rate, regular rhythm and normal heart sounds.  Exam reveals no friction rub.   No murmur heard. Pulmonary/Chest: Effort normal. No stridor. No respiratory distress. He has no wheezes. He has rales (Bilateral lung bases). He exhibits no tenderness.  Abdominal: Soft. He exhibits no distension. There is no tenderness. There is no rebound and no guarding.  Neurological: He is alert and oriented to person, place, and time.  Skin: Skin is warm.  Psychiatric: He has a normal mood and affect.    ED Course  Procedures (including critical care time) Labs Reviewed  CBC - Abnormal; Notable for the following:    WBC 13.5 (*)    All other components within normal limits  BASIC METABOLIC PANEL - Abnormal; Notable for the following:    Glucose, Bld 134 (*)    GFR calc non Af Amer 69 (*)    GFR calc Af Amer 80 (*)    All other components within normal limits  GLUCOSE, CAPILLARY - Abnormal; Notable for the following:    Glucose-Capillary 121 (*)    All other components within normal limits  CBC - Abnormal; Notable for the following:    RBC 3.78 (*)    Hemoglobin 12.7 (*)    HCT 36.1 (*)    All other components within normal limits  CREATININE, SERUM - Abnormal; Notable for the following:    GFR calc non Af Amer 70 (*)    GFR calc Af Amer 81 (*)    All other components within normal limits  CULTURE, EXPECTORATED SPUTUM-ASSESSMENT  CULTURE, BLOOD (ROUTINE X 2)  CULTURE, BLOOD (ROUTINE X 2)  URINALYSIS, ROUTINE W REFLEX MICROSCOPIC  PROTIME-INR  BASIC METABOLIC PANEL  CBC  POCT I-STAT TROPONIN I  POCT  I-STAT TROPONIN I   Results for orders placed during the hospital encounter of 04/09/13  CBC      Result Value Range   WBC 13.5 (*) 4.0 - 10.5 K/uL   RBC 4.22  4.22 - 5.81 MIL/uL   Hemoglobin 14.3  13.0 - 17.0 g/dL   HCT 40.9  81.1 - 91.4 %   MCV 95.5  78.0 - 100.0 fL   MCH 33.9  26.0 - 34.0 pg   MCHC 35.5  30.0 - 36.0 g/dL   RDW 78.2  95.6 - 21.3 %   Platelets 210  150 - 400 K/uL  BASIC METABOLIC PANEL      Result Value Range   Sodium 136  135 - 145 mEq/L   Potassium 3.5  3.5 - 5.1 mEq/L   Chloride 99  96 - 112 mEq/L   CO2 26  19 - 32 mEq/L   Glucose, Bld 134 (*) 70 - 99 mg/dL   BUN 20  6 - 23 mg/dL   Creatinine, Ser 0.86  0.50 - 1.35 mg/dL   Calcium 9.1  8.4 - 57.8 mg/dL   GFR calc non Af Denyse Dago  69 (*) >90 mL/min   GFR calc Af Amer 80 (*) >90 mL/min  URINALYSIS, ROUTINE W REFLEX MICROSCOPIC      Result Value Range   Color, Urine YELLOW  YELLOW   APPearance CLEAR  CLEAR   Specific Gravity, Urine 1.018  1.005 - 1.030   pH 7.0  5.0 - 8.0   Glucose, UA NEGATIVE  NEGATIVE mg/dL   Hgb urine dipstick NEGATIVE  NEGATIVE   Bilirubin Urine NEGATIVE  NEGATIVE   Ketones, ur NEGATIVE  NEGATIVE mg/dL   Protein, ur NEGATIVE  NEGATIVE mg/dL   Urobilinogen, UA 0.2  0.0 - 1.0 mg/dL   Nitrite NEGATIVE  NEGATIVE   Leukocytes, UA NEGATIVE  NEGATIVE  GLUCOSE, CAPILLARY      Result Value Range   Glucose-Capillary 121 (*) 70 - 99 mg/dL  CBC      Result Value Range   WBC 9.4  4.0 - 10.5 K/uL   RBC 3.78 (*) 4.22 - 5.81 MIL/uL   Hemoglobin 12.7 (*) 13.0 - 17.0 g/dL   HCT 62.1 (*) 30.8 - 65.7 %   MCV 95.5  78.0 - 100.0 fL   MCH 33.6  26.0 - 34.0 pg   MCHC 35.2  30.0 - 36.0 g/dL   RDW 84.6  96.2 - 95.2 %   Platelets 188  150 - 400 K/uL  CREATININE, SERUM      Result Value Range   Creatinine, Ser 1.05  0.50 - 1.35 mg/dL   GFR calc non Af Amer 70 (*) >90 mL/min   GFR calc Af Amer 81 (*) >90 mL/min  PROTIME-INR      Result Value Range   Prothrombin Time 14.0  11.6 - 15.2 seconds   INR  1.10  0.00 - 1.49  POCT I-STAT TROPONIN I      Result Value Range   Troponin i, poc 0.01  0.00 - 0.08 ng/mL   Comment 3           POCT I-STAT TROPONIN I      Result Value Range   Troponin i, poc 0.00  0.00 - 0.08 ng/mL   Comment 3             Dg Chest 2 View  04/09/2013   *RADIOLOGY REPORT*  Clinical Data: Chest pressure and congestion.  CHEST - 2 VIEW  Comparison: 03/07/2012 and 08/15/2007  Findings: Two views of the chest were obtained.  Patchy densities at the lung bases, left side greater than right.  Findings are similar to the previous examination.  Upper lungs are clear.  Heart size is within normal limits.  No definite pleural effusions.  IMPRESSION: Bibasilar patchy densities, left side greater than right.  There is likely a chronic component but cannot exclude acute-on-chronic disease, particularly at the left lung base.   Original Report Authenticated By: Richarda Overlie, M.D.   US Aorta  04/09/2013   *RADIOLOGY REPORT*  Clinical Data:  Abdominal discomfort.  ULTRASOUND OF ABDOMINAL AORTA  Technique:  Ultrasound examination of the abdominal aorta was performed to evaluate for abdominal aortic aneurysm.  Comparison: CT 06/17/2008  Abdominal Aorta:  Dilatation of the mid and distal abdominal aorta with atherosclerotic plaque.  No significant dilatation of the proximal iliac arteries.        Maximum AP diameter:  3.0 cm.       Maximum TRV diameter:  3.4 cm.  Right kidney:  Right kidney measures 9.5 cm in length.  There is mild cortical thinning.  Negative  for hydronephrosis.  Left kidney:  Left kidney measures 10.5 cm in length without hydronephrosis.  Mild cortical thinning.  IMPRESSION: Abdominal aortic aneurysm measuring up to 3.4 cm.  Mild cortical thinning in the kidneys without hydronephrosis.   Original Report Authenticated By: Richarda Overlie, M.D.   1. CAP (community acquired pneumonia)   2. Chest pain   3. Rheumatoid arthritis(714.0)   4. Unspecified essential hypertension   5. Family  history of early CAD   6. Stroke     MDM  Waynetta Pean 72 y.o. presents with ligtheadedness, nausea, vomiting, and chest pain. Chest pain resolved prior to arrival. AFVSS at bed side. ASA given.  Initial troponin negative. EKG unchanged from previous. Chest x-ray shows diffuse bilateral bibasilar infiltrates worse in previous x-ray. Concern for multifocal pneumonia. Patient was started on antibiotics in the emergency department, for community acquired pneumonia. Considering the patient was also having chest pain and has risk factors for ACS, admission indicated for ACS rule out treatment of pneumonia. ECG, labs, and imaging reviewed. I discussed this patient's care with my attending, Dr. Freida Busman.   Sena Hitch, MD 04/09/13 [phone]

## 2013-04-10 DIAGNOSIS — I517 Cardiomegaly: Secondary | ICD-10-CM

## 2013-04-10 LAB — CBC
MCV: 95.1 fL (ref 78.0–100.0)
Platelets: 170 10*3/uL (ref 150–400)
RDW: 15 % (ref 11.5–15.5)
WBC: 8.1 10*3/uL (ref 4.0–10.5)

## 2013-04-10 LAB — EXPECTORATED SPUTUM ASSESSMENT W GRAM STAIN, RFLX TO RESP C

## 2013-04-10 LAB — BASIC METABOLIC PANEL
Calcium: 8.8 mg/dL (ref 8.4–10.5)
Creatinine, Ser: 1.09 mg/dL (ref 0.50–1.35)
GFR calc Af Amer: 77 mL/min — ABNORMAL LOW (ref >90)

## 2013-04-10 LAB — TROPONIN I: Troponin I: <0.3 ng/mL (ref <0.30)

## 2013-04-10 MED ORDER — AZITHROMYCIN 250 MG PO TABS: 250.0000 mg | ORAL_TABLET | Freq: Every day | ORAL | Status: DC

## 2013-04-10 MED ORDER — AMOXICILLIN-POT CLAVULANATE 875-125 MG PO TABS: 1.0000 | ORAL_TABLET | Freq: Two times a day (BID) | ORAL | Status: DC

## 2013-04-10 NOTE — Progress Notes (Signed)
[facility]                                                                                Patient Demographics  Brett Castaneda, is a 72 y.o. male, DOB - Jan 31, 1941, ZOX:[id number], WJX:[id number]  Admit date - 04/09/2013  Admitting Physician Leroy Sea, MD  Outpatient Primary MD for the patient is Rogelia Boga, MD  LOS - 1   Chief Complaint  Patient presents with  . Chest Pain        Assessment & Plan    1. CAP - follow sputum and blood cultures, much improved on empiric antibiotics which are Zosyn along with azithromycin. Nebulizer treatments and oxygen as needed. Follow culture results.     2. Chest pain lightheadedness, dizziness and sweating - EKG and cycled troponin are stable, we'll continue home dose aspirin,  beta blocker, cardiology input appreciated. Will check echogram also to rule out wall motion abnormalities. Outpatient stress testing if stable post discharge as per cardiology recommendation.   3. History of rheumatoid arthritis on methotrexate and prednisone will be continued.    4. Throbbing sensation and flank areas which has been ongoing for the last few years. Obtained abdominal vascular ultrasound which shows a 3.4 cm AAA, continue beta blocker schedule outpatient vascular surgery followup post discharge..    5. GERD. Continue PPI.     Code Status: full  Family Communication: wife  Disposition Plan: home   Procedures Echo   Consults Cardiology   DVT Prophylaxis  Heparin   Lab Results  Component Value Date   PLT 170 04/10/2013    Medications  Scheduled Meds: . aspirin  325 mg Oral Daily  . azithromycin  500 mg Intravenous Q24H  . folic acid-pyridoxine-cyancobalamin  1 tablet Oral Daily  . heparin  5,000 Units Subcutaneous Q8H  . latanoprost  1 drop Left Eye QHS  . [START ON 04/11/2013] methotrexate  10 mg Oral Custom  . metoprolol tartrate  25 mg Oral BID  . pantoprazole  80 mg Oral Daily  .  piperacillin-tazobactam (ZOSYN)  IV  3.375 g Intravenous Q8H  . predniSONE  20 mg Oral Q breakfast  . sodium chloride  3 mL Intravenous Q12H  . timolol  1 drop Left Eye Daily   Continuous Infusions: . sodium chloride 50 mL/hr at 04/09/13 2045   PRN Meds:.acetaminophen, acetaminophen, albuterol, alum & mag hydroxide-simeth, diphenhydrAMINE, guaiFENesin-dextromethorphan, ondansetron (ZOFRAN) IV, ondansetron, polyethylene glycol, zolpidem  Antibiotics    Anti-infectives   Start     Dose/Rate Route Frequency Ordered Stop   04/10/13 0400  piperacillin-tazobactam (ZOSYN) IVPB 3.375 g     3.375 g 12.5 mL/hr over 240 Minutes Intravenous 3 times per day 04/09/13 1831     04/09/13 1830  azithromycin (ZITHROMAX) 500 mg in dextrose 5 % 250 mL IVPB     500 mg 250 mL/hr over 60 Minutes Intravenous Every 24 hours 04/09/13 1819     04/09/13 1815  piperacillin-tazobactam (ZOSYN) IVPB 3.375 g     3.375 g 12.5 mL/hr over 240 Minutes Intravenous  Once 04/09/13 1802 04/10/13 0355   04/09/13 1815  levofloxacin (LEVAQUIN) IVPB 750 mg  Status:  Discontinued  750 mg 100 mL/hr over 90 Minutes Intravenous Every 24 hours 04/09/13 1802 04/09/13 1819       Time Spent in minutes   35   SINGH,PRASHANT K M.D on 04/10/2013 at 8:33 AM  Between 7am to 7pm - Pager - [phone]  After 7pm go to [email or web address] - password [email or web address]  And look for the night coverage person covering for me after hours  [facility] Office  [phone]    Subjective:   Brett Castaneda today has, No headache, No chest pain, No abdominal pain - No Nausea, No new weakness tingling or numbness, No Cough - SOB.    Objective:   Filed Vitals:   04/09/13 1957 04/09/13 2042 04/10/13 0500 04/10/13 0626  BP: 119/86 148/90  121/68  Pulse: 75 71  59  Temp:  97.8 F (36.6 C)  98.6 F (37 C)  TempSrc:  Oral  Oral  Resp:  16  16  Height:  5\' 11"  (1.803 m)    Weight:  80.287 kg (177 lb) 78.881 kg (173 lb 14.4 oz)   SpO2:  99%   95%    Wt Readings from Last 3 Encounters:  04/10/13 78.881 kg (173 lb 14.4 oz)  03/26/13 81.647 kg (180 lb)  03/04/13 81.647 kg (180 lb)     Intake/Output Summary (Last 24 hours) at 04/10/13 0833 Last data filed at 04/10/13 0804  Gross per 24 hour  Intake    825 ml  Output   1026 ml  Net   -201 ml    Exam Awake Alert, Oriented X 3, No new F.N deficits, Normal affect [place].AT,PERRAL Supple Neck,No JVD, No cervical lymphadenopathy appriciated.  Symmetrical Chest wall movement, Good air movement bilaterally, CTAB RRR,No Gallops,Rubs or new Murmurs, No Parasternal Heave +ve B.Sounds, Abd Soft, Non tender, No organomegaly appriciated, No rebound - guarding or rigidity. No Cyanosis, Clubbing or edema, No new Rash or bruise      Data Review   Micro Results Recent Results (from the past 240 hour(s))  CULTURE, EXPECTORATED SPUTUM-ASSESSMENT     Status: None   Collection Time    04/10/13 12:18 AM      Result Value Range Status   Specimen Description SPUTUM   Final   Special Requests NONE   Final   Sputum evaluation     Final   Value: THIS SPECIMEN IS ACCEPTABLE. RESPIRATORY CULTURE REPORT TO FOLLOW.   Report Status 04/10/2013 FINAL   Final    Radiology Reports Dg Chest 2 View  04/09/2013   *RADIOLOGY REPORT*  Clinical Data: Chest pressure and congestion.  CHEST - 2 VIEW  Comparison: 03/07/2012 and 08/15/2007  Findings: Two views of the chest were obtained.  Patchy densities at the lung bases, left side greater than right.  Findings are similar to the previous examination.  Upper lungs are clear.  Heart size is within normal limits.  No definite pleural effusions.  IMPRESSION: Bibasilar patchy densities, left side greater than right.  There is likely a chronic component but cannot exclude acute-on-chronic disease, particularly at the left lung base.   Original Report Authenticated By: Richarda Overlie, M.D.   US Aorta  04/09/2013   *RADIOLOGY REPORT*  Clinical Data:  Abdominal discomfort.   ULTRASOUND OF ABDOMINAL AORTA  Technique:  Ultrasound examination of the abdominal aorta was performed to evaluate for abdominal aortic aneurysm.  Comparison: CT 06/17/2008  Abdominal Aorta:  Dilatation of the mid and distal abdominal aorta with atherosclerotic plaque.  No significant dilatation of the  proximal iliac arteries.        Maximum AP diameter:  3.0 cm.       Maximum TRV diameter:  3.4 cm.  Right kidney:  Right kidney measures 9.5 cm in length.  There is mild cortical thinning.  Negative for hydronephrosis.  Left kidney:  Left kidney measures 10.5 cm in length without hydronephrosis.  Mild cortical thinning.  IMPRESSION: Abdominal aortic aneurysm measuring up to 3.4 cm.  Mild cortical thinning in the kidneys without hydronephrosis.   Original Report Authenticated By: Richarda Overlie, M.D.    CBC  Recent Labs Lab 04/09/13 1619 04/09/13 2047 04/10/13 0335  WBC 13.5* 9.4 8.1  HGB 14.3 12.7* 12.1*  HCT 40.3 36.1* 35.3*  PLT 210 188 170  MCV 95.5 95.5 95.1  MCH 33.9 33.6 32.6  MCHC 35.5 35.2 34.3  RDW 15.1 15.0 15.0    Chemistries   Recent Labs Lab 04/09/13 1619 04/09/13 2047 04/10/13 0335  NA 136  --  137  K 3.5  --  4.0  CL 99  --  102  CO2 26  --  30  GLUCOSE 134*  --  97  BUN 20  --  17  CREATININE 1.06 1.05 1.09  CALCIUM 9.1  --  8.8   ------------------------------------------------------------------------------------------------------------------ estimated creatinine clearance is 67.2 ml/min (by C-G formula based on Cr of 1.09). ------------------------------------------------------------------------------------------------------------------ No results found for this basename: HGBA1C,  in the last 72 hours ------------------------------------------------------------------------------------------------------------------ No results found for this basename: CHOL, HDL, LDLCALC, TRIG, CHOLHDL, LDLDIRECT,  in the last 72  hours ------------------------------------------------------------------------------------------------------------------ No results found for this basename: TSH, T4TOTAL, FREET3, T3FREE, THYROIDAB,  in the last 72 hours ------------------------------------------------------------------------------------------------------------------ No results found for this basename: VITAMINB12, FOLATE, FERRITIN, TIBC, IRON, RETICCTPCT,  in the last 72 hours  Coagulation profile  Recent Labs Lab 04/09/13 2047  INR 1.10    No results found for this basename: DDIMER,  in the last 72 hours  Cardiac Enzymes No results found for this basename: CK, CKMB, TROPONINI, MYOGLOBIN,  in the last 168 hours ------------------------------------------------------------------------------------------------------------------ No components found with this basename: POCBNP,

## 2013-04-10 NOTE — Discharge Summary (Signed)
[facility]                                                                                   Brett Castaneda, is a 72 y.o. male  DOB Apr 28, 1941  MRN [id number].  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 BASAL CELL CARCINOMA, FACE 03/31/2009  . CEREBROVASCULAR ACCIDENT, HX OF 03/24/2007  . CONSTIPATION 06/22/2008  . DIVERTICULOSIS, COLON 03/24/2007  . GERD 03/24/2007  . HYPERLIPIDEMIA 08/27/2007  . HYPERTENSION 12/14/2008  . Rheumatoid arthritis(714.0) 03/24/2007  . Blind right eye   . Retinal detachment     hx of  . Stroke   . Keratosis Oct. 2013    Past Surgical History  Procedure Laterality Date  . Total knee arthroplasty      bital  . Eye surgery      retinal detachment  . Carpal tunnel release    . Carotid endarterectomy    . Cataract extraction  1980    removed cataract in left eye. Currently blind in right eye  . Cholecystectomy  2001    Gall Bladder  . 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 male, patient with history of dyslipidemia, rheumatoid arthritis on prednisone and methotrexate, dyslipidemia, hypertension, GERD who was admitted yesterday with symptoms of chest pain along with findings suggestive of community acquired pneumonia.  Was admitted the hospital for workup of chest pain and treatment of committed for pneumonia, he was started on empiric antibiotics which included Unasyn azithromycin as he had some preceding history  of nausea and vomiting, also he was worked up for ACS, he ruled out for MI with serial negative troponins, he had an unremarkable echo gram and stable telemetry monitoring for 24 hours. He was seen by cardiologist Dr. Rennis Golden who cleared him for home discharge with outpatient followup with him for a noninvasive stress test in an outpatient setting.    Patient currently is completing symptom-free, he will be discharged home on oral antibiotics, his blood culture results are still pending will request primary care physician to please follow final culture results on next visit and obtain a two-view chest x-ray next visit along with CBC and BMP.    Complained of some throbbing sensation in his flank areas which has been present for the last several years for which he underwent a vascular abdominal ultrasound revealing a 3.4 cm AAA,  he already follows with Dr. early vascular surgeon and I will request the patient to follow with him post discharge within a week for this problem. He has agreed to do so.   For his rheumatoid arthritis he'll continue taking his methotrexate and prednisone unchanged, for his GERD he will continue his PPI.     Today   Subjective:   Brett Castaneda today has no headache,no chest abdominal pain,no new weakness tingling or numbness, feels much better wants to go home today.   Objective:   Blood pressure 118/72, pulse 63, temperature 98 F (36.7 C), temperature source Oral, resp. rate 18, height 5\' 11"  (1.803 m), weight 78.881 kg (173 lb 14.4 oz), SpO2 97.00%.   Intake/Output Summary (Last 24 hours) at 04/10/13 1432 Last data filed at 04/10/13 1306  Gross per 24 hour  Intake 1679.17 ml  Output   1426 ml  Net 253.17 ml    Exam Awake Alert, Oriented *3, No new F.N deficits, Normal affect [place].AT,PERRAL Supple Neck,No JVD, No cervical lymphadenopathy appriciated.  Symmetrical Chest wall movement, Good air movement bilaterally, CTAB RRR,No Gallops,Rubs or new Murmurs, No Parasternal Heave +ve B.Sounds, Abd Soft, Non tender, No organomegaly appriciated, No rebound -guarding or rigidity. No Cyanosis, Clubbing or edema, No new Rash or bruise  Data Review   Major procedures and Radiology Reports - PLEASE review detailed and final reports for all details in brief -   Echo Left ventricle: The cavity size was normal. Wall thickness was increased in a pattern of mild LVH. The estimated ejection fraction was 60%. Wall motion was normal; there were no regional wall motion abnormalities.     Dg Chest 2 View  04/09/2013   *RADIOLOGY REPORT*  Clinical Data: Chest pressure and congestion.  CHEST - 2 VIEW  Comparison: 03/07/2012 and 08/15/2007  Findings: Two views of the chest were obtained.  Patchy densities at the lung bases, left side greater than right.  Findings are similar to the previous  examination.  Upper lungs are clear.  Heart size is within normal limits.  No definite pleural effusions.  IMPRESSION: Bibasilar patchy densities, left side greater than right.  There is likely a chronic component but cannot exclude acute-on-chronic disease, particularly at the left lung base.   Original Report Authenticated By: Richarda Overlie, M.D.   US Aorta  04/09/2013   *RADIOLOGY REPORT*  Clinical Data:  Abdominal discomfort.  ULTRASOUND OF ABDOMINAL AORTA  Technique:  Ultrasound examination of the abdominal aorta was performed to evaluate for abdominal aortic aneurysm.  Comparison: CT 06/17/2008  Abdominal Aorta:  Dilatation of the mid and distal abdominal aorta with atherosclerotic plaque.  No significant  dilatation of the proximal iliac arteries.        Maximum AP diameter:  3.0 cm.       Maximum TRV diameter:  3.4 cm.  Right kidney:  Right kidney measures 9.5 cm in length.  There is mild cortical thinning.  Negative for hydronephrosis.  Left kidney:  Left kidney measures 10.5 cm in length without hydronephrosis.  Mild cortical thinning.  IMPRESSION: Abdominal aortic aneurysm measuring up to 3.4 cm.  Mild cortical thinning in the kidneys without hydronephrosis.   Original Report Authenticated By: Richarda Overlie, M.D.    Micro Results      Recent Results (from the past 240 hour(s))  CULTURE, EXPECTORATED SPUTUM-ASSESSMENT     Status: None   Collection Time    04/10/13 12:18 AM      Result Value Range Status   Specimen Description SPUTUM   Final   Special Requests NONE   Final   Sputum evaluation     Final   Value: THIS SPECIMEN IS ACCEPTABLE. RESPIRATORY CULTURE REPORT TO FOLLOW.   Report Status 04/10/2013 FINAL   Final  CULTURE, RESPIRATORY (NON-EXPECTORATED)     Status: None   Collection Time    04/10/13 12:18 AM      Result Value Range Status   Specimen Description SPUTUM   Final   Special Requests NONE   Final   Gram Stain     Final   Value: MODERATE WBC PRESENT, PREDOMINANTLY PMN      RARE SQUAMOUS EPITHELIAL CELLS PRESENT     RARE GRAM POSITIVE COCCI     IN PAIRS   Culture PENDING   Incomplete   Report Status PENDING   Incomplete     CBC w Diff: Lab Results  Component Value Date   WBC 8.1 04/10/2013   HGB 12.1* 04/10/2013   HCT 35.3* 04/10/2013   PLT 170 04/10/2013   LYMPHOPCT 21.9 03/04/2013   MONOPCT 5.8 03/04/2013   EOSPCT 5.5* 03/04/2013   BASOPCT 0.5 03/04/2013    CMP: Lab Results  Component Value Date   NA 137 04/10/2013   K 4.0 04/10/2013   CL 102 04/10/2013   CO2 30 04/10/2013   BUN 17 04/10/2013   CREATININE 1.09 04/10/2013   PROT 7.4 03/04/2013   ALBUMIN 3.3* 03/04/2013   BILITOT 1.1 03/04/2013   ALKPHOS 65 03/04/2013   AST 30 03/04/2013   ALT 23 03/04/2013  .   Discharge Instructions     Follow with Primary MD Rogelia Boga, MD in 4 days   Get CBC, CMP, checked 4 days by Primary MD and again as instructed by your Primary MD. Get a 2 view Chest X ray done next visit.  Get Medicines reviewed and adjusted.  Please request your Prim.MD to go over all Hospital Tests and Procedure/Radiological results at the follow up, please get all Hospital records sent to your Prim MD by signing hospital release before you go home.  Activity: As tolerated with Full fall precautions use walker/cane & assistance as needed   Diet:  Heart Healthy  For Heart failure patients - Check your Weight same time everyday, if you gain over 2 pounds, or you develop in leg swelling, experience more shortness of breath or chest pain, call your Primary MD immediately. Follow Cardiac Low Salt Diet and 1.8 lit/day fluid restriction.  Disposition Home   If you experience worsening of your admission symptoms, develop shortness of breath, life threatening emergency, suicidal or homicidal thoughts you must seek medical attention  immediately by calling 911 or calling your MD immediately  if symptoms less severe.  You Must read complete instructions/literature along with all the  possible adverse reactions/side effects for all the Medicines you take and that have been prescribed to you. Take any new Medicines after you have completely understood and accpet all the possible adverse reactions/side effects.   Do not drive and provide baby sitting services if your were admitted for syncope or siezures until you have seen by Primary MD or a Neurologist and advised to do so again.  Do not drive when taking Pain medications.    Do not take more than prescribed Pain, Sleep and Anxiety Medications  Special Instructions: If you have smoked or chewed Tobacco  in the last 2 yrs please stop smoking, stop any regular Alcohol  and or any Recreational drug use.  Wear Seat belts while driving.   Please note  You were cared for by a hospitalist during your hospital stay. If you have any questions about your discharge medications or the care you received while you were in the hospital after you are discharged, you can call the unit and asked to speak with the hospitalist on call if the hospitalist that took care of you is not available. Once you are discharged, your primary care physician will handle any further medical issues. Please note that NO REFILLS for any discharge medications will be authorized once you are discharged, as it is imperative that you return to your primary care physician (or establish a relationship with a primary care physician if you do not have one) for your aftercare needs so that they can reassess your need for medications and monitor your lab values.    Follow-up Information   Follow up with Rogelia Boga, MD. Schedule an appointment as soon as possible for a visit in 4 days.   Contact information:   [address] [place] [address] [phone]       Follow up with EARLY, TODD, MD. Schedule an appointment as soon as possible for a visit in 1 week.   Contact information:   [address] [place] [address] [phone]        Follow up with Chrystie Nose, MD. Schedule an appointment as soon as possible for a visit in 1 week.   Contact information:   [address] [place] [address] [phone]         Discharge Medications     Medication List         amoxicillin-clavulanate 875-125 MG per tablet  Commonly known as:  AUGMENTIN  Take 1 tablet by mouth 2 (two) times daily.     aspirin 325 MG tablet  Take 325 mg by mouth daily.     azithromycin 250 MG tablet  Commonly known as:  ZITHROMAX  Take 1 tablet (250 mg total) by mouth daily.     chlorpheniramine 4 MG tablet  Commonly known as:  CHLOR-TRIMETON  Take 4 mg by mouth daily as needed for allergies.     FOLBIC 2.5-25-2 MG Tabs  Generic drug:  folic acid-pyridoxine-cyancobalamin  take 1 tablet by mouth once daily     latanoprost 0.005 % ophthalmic solution  Commonly known as:  XALATAN  Place 1 drop into the left eye at bedtime.     methotrexate 2.5 MG tablet  Commonly known as:  RHEUMATREX  Take 10 mg by mouth 2 (two) times a week. Saturday and Sunday; Caution:Chemotherapy. Protect from light.  omeprazole 40 MG capsule  Commonly known as:  PRILOSEC  take 1 capsule by mouth once daily     polyethylene glycol packet  Commonly known as:  MIRALAX / GLYCOLAX  Take 17 g by mouth daily as needed (for constipation).     predniSONE 20 MG tablet  Commonly known as:  DELTASONE  Take 1 tablet (20 mg total) by mouth daily.     timolol 0.5 % ophthalmic solution  Commonly known as:  TIMOPTIC  Place 1 drop into the left eye daily.           Total Time in preparing paper work, data evaluation and todays exam - 35 minutes  Leroy Sea M.D on 04/10/2013 at 2:32 PM  [facility] Office  [phone]

## 2013-04-10 NOTE — Progress Notes (Signed)
  Echocardiogram 2D Echocardiogram has been performed.  Brett Castaneda 04/10/2013, 9:37 AM

## 2013-04-10 NOTE — Progress Notes (Signed)
Notified K. Schorr NP about patient refusal to take metoprolol 25 MG and prednisone 20 mg.  Also informed her that patient would like to be DNR status.  No orders at this time.  Will continue to monitor.  Patient denies pain and is resting comfortably.  Vitals stable.  Barrie Lyme 12:29 AM 04/10/2013

## 2013-04-10 NOTE — Progress Notes (Signed)
Discussed discharge instructions with pt including follow up appt, medications to take at home, prescriptions, activity, and diet. Pt and spouse verbalized understanding and denied any questions.   Juliane Lack, RN

## 2013-04-10 NOTE — Progress Notes (Signed)
Subjective: No complaints.  Ready to go.  He says his CP was a pressure that occurred while vomiting.   Objective: Vital signs in last 24 hours: Temp:  [97.8 F (36.6 C)-98.6 F (37 C)] 98.6 F (37 C) (07/18 0626) Pulse Rate:  [59-109] 59 (07/18 0626) Resp:  [16-20] 16 (07/18 0626) BP: (119-162)/(68-115) 121/68 mmHg (07/18 0626) SpO2:  [95 %-99 %] 95 % (07/18 0626) Weight:  [173 lb 14.4 oz (78.881 kg)-177 lb (80.287 kg)] 173 lb 14.4 oz (78.881 kg) (07/18 0500) Last BM Date: 04/09/13  Intake/Output from previous day: 07/17 0701 - 07/18 0700 In: 825 [I.V.:500; IV Piggyback:325] Out: 876 [Urine:875; Stool:1] Intake/Output this shift:    Medications Current Facility-Administered Medications  Medication Dose Route Frequency Provider Last Rate Last Dose  . 0.9 %  sodium chloride infusion   Intravenous Continuous Leroy Sea, MD 50 mL/hr at 04/09/13 2045    . acetaminophen (TYLENOL) tablet 650 mg  650 mg Oral Q6H PRN Leroy Sea, MD       Or  . acetaminophen (TYLENOL) suppository 650 mg  650 mg Rectal Q6H PRN Leroy Sea, MD      . albuterol (PROVENTIL) (5 MG/ML) 0.5% nebulizer solution 2.5 mg  2.5 mg Nebulization Q2H PRN Leroy Sea, MD      . alum & mag hydroxide-simeth (MAALOX/MYLANTA) 200-200-20 MG/5ML suspension 30 mL  30 mL Oral Q6H PRN Leroy Sea, MD      . aspirin tablet 325 mg  325 mg Oral Daily Leroy Sea, MD   325 mg at 04/09/13 2347  . azithromycin (ZITHROMAX) 500 mg in dextrose 5 % 250 mL IVPB  500 mg Intravenous Q24H Leroy Sea, MD 250 mL/hr at 04/09/13 1939 500 mg at 04/09/13 1939  . diphenhydrAMINE (BENADRYL) tablet 25 mg  25 mg Oral Q6H PRN Leroy Sea, MD      . folic acid-pyridoxine-cyancobalamin (FOLTX) 2.5-25-2 MG per tablet 1 tablet  1 tablet Oral Daily Leroy Sea, MD   1 tablet at 04/09/13 2347  . guaiFENesin-dextromethorphan (ROBITUSSIN DM) 100-10 MG/5ML syrup 5 mL  5 mL Oral Q4H PRN Leroy Sea, MD        . heparin injection 5,000 Units  5,000 Units Subcutaneous Q8H Leroy Sea, MD   5,000 Units at 04/10/13 0559  . latanoprost (XALATAN) 0.005 % ophthalmic solution 1 drop  1 drop Left Eye QHS Leroy Sea, MD      . Melene Muller ON 04/11/2013] methotrexate (RHEUMATREX) tablet 10 mg  10 mg Oral Custom Leroy Sea, MD      . metoprolol tartrate (LOPRESSOR) tablet 25 mg  25 mg Oral BID Leroy Sea, MD      . ondansetron [facility]) tablet 4 mg  4 mg Oral Q6H PRN Leroy Sea, MD       Or  . ondansetron (ZOFRAN) injection 4 mg  4 mg Intravenous Q6H PRN Leroy Sea, MD   4 mg at 04/09/13 2243  . pantoprazole (PROTONIX) EC tablet 80 mg  80 mg Oral Daily Leroy Sea, MD      . piperacillin-tazobactam (ZOSYN) IVPB 3.375 g  3.375 g Intravenous Q8H Abran Duke, RPH   3.375 g at 04/10/13 0559  . polyethylene glycol (MIRALAX / GLYCOLAX) packet 17 g  17 g Oral Daily PRN Leroy Sea, MD      . predniSONE (DELTASONE) tablet 20 mg  20 mg Oral Q breakfast  Leroy Sea, MD      . sodium chloride 0.9 % injection 3 mL  3 mL Intravenous Q12H Leroy Sea, MD      . timolol (TIMOPTIC) 0.5 % ophthalmic solution 1 drop  1 drop Left Eye Daily Leroy Sea, MD      . zolpidem (AMBIEN) tablet 5 mg  5 mg Oral QHS PRN Leroy Sea, MD        PE: General appearance: alert, cooperative and no distress Lungs: clear to auscultation bilaterally Heart: regular rate and rhythm, S1, S2 normal, no murmur, click, rub or gallop Extremities: No LEE Pulses: 2+ and symmetric Neurologic: Grossly normal  Lab Results:   Recent Labs  04/09/13 1619 04/09/13 2047 04/10/13 0335  WBC 13.5* 9.4 8.1  HGB 14.3 12.7* 12.1*  HCT 40.3 36.1* 35.3*  PLT 210 188 170   BMET  Recent Labs  04/09/13 1619 04/09/13 2047 04/10/13 0335  NA 136  --  137  K 3.5  --  4.0  CL 99  --  102  CO2 26  --  30  GLUCOSE 134*  --  97  BUN 20  --  17  CREATININE 1.06 1.05 1.09  CALCIUM 9.1  --  8.8    PT/INR  Recent Labs  04/09/13 2047  LABPROT 14.0  INR 1.10   Assessment/Plan  Principal Problem:   CAP (community acquired pneumonia) Active Problems:   Malignant neoplasm of skin of parts of face   HYPERLIPIDEMIA   HYPERTENSION   GERD   DIVERTICULOSIS, COLON   CONSTIPATION   Rheumatoid arthritis(714.0)   CEREBROVASCULAR ACCIDENT, HX OF   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  On Lopressor but apparently he refused it last night.  Negative MI.   +/- out patient stress test.  CP atypical.  BP and HR stable.   LOS: 1 day    HAGER, BRYAN 04/10/2013 7:56 AM   Agree with note written by Jones Skene PAC  Atyp CP with + CRF, neg enz. OK for D/C. Will arrange OP myoview the F/U in office.   Runell Gess 04/10/2013 8:50 AM

## 2013-04-12 LAB — CULTURE, RESPIRATORY W GRAM STAIN: Culture: NORMAL

## 2013-04-12 NOTE — ED Provider Notes (Signed)
I saw and evaluated the patient, reviewed the resident's note and I agree with the findings and plan.  Elyon Zoll T Jermain Curt, MD 04/12/13 0706 

## 2013-04-13 ENCOUNTER — Telehealth: Payer: Self-pay | Admitting: Internal Medicine

## 2013-04-13 NOTE — Telephone Encounter (Signed)
Discharged on Friday and was told to follow up with Dr Early also-Called to make an appt today--they told him they would need a referral from [place].

## 2013-04-13 NOTE — Telephone Encounter (Signed)
Returned call and spoke w/ Gaynelle Adu, pt's wife.  Stated pt was told to see Dr. Arbie Cookey and needs a referral.  Pt scheduled to see Dr. Rennis Golden on this Thursday as a new pt in the office.  Informed wife that Dr. Rennis Golden will likely make the referral at his visit on Thursday and will be notified of call.  If sooner referral needed, Dr. Rennis Golden will place order.  Wife verbalized understanding and agreed w/ plan.

## 2013-04-16 ENCOUNTER — Encounter: Payer: Self-pay | Admitting: Internal Medicine

## 2013-04-16 ENCOUNTER — Ambulatory Visit (INDEPENDENT_AMBULATORY_CARE_PROVIDER_SITE_OTHER): Payer: Medicare Other | Admitting: Internal Medicine

## 2013-04-16 ENCOUNTER — Encounter (HOSPITAL_COMMUNITY): Payer: Self-pay | Admitting: Internal Medicine

## 2013-04-16 VITALS — BP 138/82 | HR 80 | Ht 71.0 in | Wt 176.9 lb

## 2013-04-16 VITALS — BP 136/90 | HR 82 | Temp 98.7°F | Resp 20 | Wt 178.0 lb

## 2013-04-16 DIAGNOSIS — J189 Pneumonia, unspecified organism: Secondary | ICD-10-CM

## 2013-04-16 DIAGNOSIS — I10 Essential (primary) hypertension: Secondary | ICD-10-CM

## 2013-04-16 DIAGNOSIS — I714 Abdominal aortic aneurysm, without rupture, unspecified: Secondary | ICD-10-CM

## 2013-04-16 DIAGNOSIS — R079 Chest pain, unspecified: Secondary | ICD-10-CM

## 2013-04-16 DIAGNOSIS — M069 Rheumatoid arthritis, unspecified: Secondary | ICD-10-CM

## 2013-04-16 LAB — CULTURE, BLOOD (ROUTINE X 2)

## 2013-04-16 NOTE — Patient Instructions (Addendum)
Your physician has requested that you have a lexiscan myoview. For further information please visit [email or web address]. Please follow instruction sheet, as given. We will call you with the results.   Your physician wants you to follow-up in: 1 year. You will receive a reminder letter in the mail two months in advance. If you don't receive a letter, please call our office to schedule the follow-up appointment.  

## 2013-04-16 NOTE — Patient Instructions (Signed)
Limit your sodium (Salt) intake    It is important that you exercise regularly, at least 20 minutes 3 to 4 times per week.  If you develop chest pain or shortness of breath seek  medical attention.  Return in 6 months for follow-up  

## 2013-04-16 NOTE — Progress Notes (Signed)
Subjective:    Patient ID: Brett Castaneda, male    DOB: [birth year], 72 y.o.   MRN: [id number]  HPI  72 year old patient who is seen today in followup. He was discharged recently for inpatient treatment of a suspected community acquired pneumonia. He has a history of hypertension and cerebrovascular disease. He also presented with chest pain that was felt to be atypical for angina but he is scheduled for a stress test later today. He is doing quite well but remains slightly weak. Denies any fever. He does have a history of rheumatoid arthritis Hospital records reviewed. Abdominal ultrasound revealed a 3 cm AAA  Past Medical History  Diagnosis Date  . BASAL CELL CARCINOMA, FACE 03/31/2009  . CEREBROVASCULAR ACCIDENT, HX OF 03/24/2007  . CONSTIPATION 06/22/2008  . DIVERTICULOSIS, COLON 03/24/2007  . GERD 03/24/2007  . HYPERLIPIDEMIA 08/27/2007  . HYPERTENSION 12/14/2008  . Rheumatoid arthritis(714.0) 03/24/2007  . Blind right eye   . Retinal detachment     hx of  . Stroke   . Keratosis Oct. 2013    History   Social History  . Marital Status: Married    Spouse Name: N/A    Number of Children: N/A  . Years of Education: N/A   Occupational History  . Not on file.   Social History Main Topics  . Smoking status: Former Smoker -- 23 years    Types: Cigarettes    Quit date: 09/24/1980  . Smokeless tobacco: Never Used  . Alcohol Use: No  . Drug Use: No  . Sexually Active: Not on file   Other Topics Concern  . Not on file   Social History Narrative  . No narrative on file    Past Surgical History  Procedure Laterality Date  . Total knee arthroplasty      bital  . Eye surgery      retinal detachment  . Carpal tunnel release    . Carotid endarterectomy    . Cataract extraction  1980    removed cataract in left eye. Currently blind in right eye  . Cholecystectomy  2001    Gall Bladder  . Retinal detachment surgery  2004    Family History  Problem Relation Age of Onset  .  Stroke Mother 67  . Heart disease Father   . Coronary artery disease Sister   . Heart disease Sister   . Diabetes Sister   . Hypertension Sister     Allergies  Allergen Reactions  . Dopamine Anaphylaxis  . Morphine And Related Other (See Comments)    Hallucinations, very bad reactions  . Vicodin (Hydrocodone-Acetaminophen) Nausea And Vomiting    Current Outpatient Prescriptions on File Prior to Visit  Medication Sig Dispense Refill  . aspirin 325 MG tablet Take 325 mg by mouth daily.       . chlorpheniramine (CHLOR-TRIMETON) 4 MG tablet Take 4 mg by mouth daily as needed for allergies.      . FOLBIC 2.5-25-2 MG TABS take 1 tablet by mouth once daily  90 tablet  1  . latanoprost (XALATAN) 0.005 % ophthalmic solution Place 1 drop into the left eye at bedtime.       . methotrexate (RHEUMATREX) 2.5 MG tablet Take 10 mg by mouth 2 (two) times a week. Saturday and Sunday; Caution:Chemotherapy. Protect from light.      Marland Kitchen omeprazole (PRILOSEC) 40 MG capsule take 1 capsule by mouth once daily  90 capsule  3  . polyethylene glycol (MIRALAX / GLYCOLAX) packet  Take 17 g by mouth daily as needed (for constipation).      . timolol (TIMOPTIC) 0.5 % ophthalmic solution Place 1 drop into the left eye daily.        No current facility-administered medications on file prior to visit.    BP 136/90  Pulse 82  Temp(Src) 98.7 F (37.1 C) (Oral)  Resp 20  Wt 178 lb (80.74 kg)  BMI 24.84 kg/m2  SpO2 96%       Review of Systems  Constitutional: Positive for fatigue. Negative for fever, chills and appetite change.  HENT: Negative for hearing loss, ear pain, congestion, sore throat, trouble swallowing, neck stiffness, dental problem, voice change and tinnitus.   Eyes: Negative for pain, discharge and visual disturbance.  Respiratory: Negative for cough, chest tightness, wheezing and stridor.   Cardiovascular: Negative for chest pain, palpitations and leg swelling.  Gastrointestinal: Negative  for nausea, vomiting, abdominal pain, diarrhea, constipation, blood in stool and abdominal distention.  Genitourinary: Negative for urgency, hematuria, flank pain, discharge, difficulty urinating and genital sores.  Musculoskeletal: Negative for myalgias, back pain, joint swelling, arthralgias and gait problem.  Skin: Negative for rash.  Neurological: Positive for weakness. Negative for dizziness, syncope, speech difficulty, numbness and headaches.  Hematological: Negative for adenopathy. Does not bruise/bleed easily.  Psychiatric/Behavioral: Negative for behavioral problems and dysphoric mood. The patient is not nervous/anxious.        Objective:   Physical Exam  Constitutional: He is oriented to person, place, and time. He appears well-developed.  Blood pressure 124/82  HENT:  Head: Normocephalic.  Right Ear: External ear normal.  Left Ear: External ear normal.  Eyes: Conjunctivae and EOM are normal.  Neck: Normal range of motion.  Cardiovascular: Normal rate and normal heart sounds.   Pulmonary/Chest: Effort normal.  A few crackles at the right base Diminished breath sounds at the left base O2 saturation 96%  Abdominal: Bowel sounds are normal.  Musculoskeletal: Normal range of motion. He exhibits no edema and no tenderness.  Neurological: He is alert and oriented to person, place, and time.  Psychiatric: He has a normal mood and affect. His behavior is normal.          Assessment & Plan:   Status post required pneumonia 3.4 cm AAA History of atypical chest pain. Stress test later today has been scheduled Rheumatoid arthritis. Followup rheumatology  Return here 6 months

## 2013-04-16 NOTE — Progress Notes (Signed)
OFFICE NOTE  Chief Complaint:  Hospital follow-up  Primary Care Physician: Rogelia Boga, MD  HPI:  Brett Castaneda is a pleasant 72 yo male with a history of stroke in 2008, rheumatoid arthritis, retinal detachment, HTN, HPL and family history of CAD. He has reported progressive weakness and shortness of breath, which has been attributed to RA flares by his PCP - for which he gets steroids. He recently presented to the hospital with severe weakness, chills, cough and congestion. He did have some substernal chest pain (AFTER VOMITING) and some throbbing flank pain. Both of which have resolved. Neither sounded particularly cardiac in etiology. He does, however, have multiple risk factors for CAD. His story of progressive weakness and dyspnea with exertion does sound concerning for CAD. EKG is not concerning for ischemia. He ruled out for MI and was scheduled to see me back in the office today for followup visit. We were considering stress testing due to the fact that he has a history of vascular disease with a small abdominal aortic aneurysm and carotid artery disease. Also because of his chest pain symptoms which potentially could have been cardiac, although did not seem to be anginal. He's had no further chest pain and actually feels much better.  PMHx:  Past Medical History  Diagnosis Date  . BASAL CELL CARCINOMA, FACE 03/31/2009  . CEREBROVASCULAR ACCIDENT, HX OF 03/24/2007  . CONSTIPATION 06/22/2008  . DIVERTICULOSIS, COLON 03/24/2007  . GERD 03/24/2007  . HYPERLIPIDEMIA 08/27/2007  . HYPERTENSION 12/14/2008  . Rheumatoid arthritis(714.0) 03/24/2007  . Blind right eye   . Retinal detachment     hx of  . Stroke   . Keratosis Oct. 2013    Past Surgical History  Procedure Laterality Date  . Total knee arthroplasty      bital  . Eye surgery      retinal detachment  . Carpal tunnel release    . Carotid endarterectomy    . Cataract extraction  1980    removed cataract in left eye.  Currently blind in right eye  . Cholecystectomy  2001    Gall Bladder  . Retinal detachment surgery  2004    FAMHx:  Family History  Problem Relation Age of Onset  . Stroke Mother 36  . Heart disease Father   . Coronary artery disease Sister   . Heart disease Sister   . Diabetes Sister   . Hypertension Sister     SOCHx:   reports that he quit smoking about 32 years ago. His smoking use included Cigarettes. He smoked 0.00 packs per day for 23 years. He has never used smokeless tobacco. He reports that he does not drink alcohol or use illicit drugs.  ALLERGIES:  Allergies  Allergen Reactions  . Dopamine Anaphylaxis  . Morphine And Related Other (See Comments)    Hallucinations, very bad reactions  . Vicodin (Hydrocodone-Acetaminophen) Nausea And Vomiting    ROS: A comprehensive review of systems was negative except for: Constitutional: positive for fatigue Respiratory: positive for dyspnea on exertion Musculoskeletal: positive for muscle weakness  HOME MEDS: Current Outpatient Prescriptions  Medication Sig Dispense Refill  . aspirin 325 MG tablet Take 325 mg by mouth daily.       . chlorpheniramine (CHLOR-TRIMETON) 4 MG tablet Take 4 mg by mouth daily as needed for allergies.      . FOLBIC 2.5-25-2 MG TABS take 1 tablet by mouth once daily  90 tablet  1  . latanoprost (XALATAN) 0.005 % ophthalmic solution  Place 1 drop into the left eye at bedtime.       . methotrexate (RHEUMATREX) 2.5 MG tablet Take 10 mg by mouth 2 (two) times a week. Saturday and Sunday; Caution:Chemotherapy. Protect from light.      . polyethylene glycol (MIRALAX / GLYCOLAX) packet Take 17 g by mouth daily as needed (for constipation).      . timolol (TIMOPTIC) 0.5 % ophthalmic solution Place 1 drop into the left eye daily.        No current facility-administered medications for this visit.    LABS/IMAGING: No results found for this or any previous visit (from the past 48 hour(s)). No results  found.  VITALS: BP 138/82  Pulse 80  Ht 5\' 11"  (1.803 m)  Wt 176 lb 14.4 oz (80.241 kg)  BMI 24.68 kg/m2  EXAM: General appearance: alert and no distress Neck: no adenopathy, no carotid bruit, no JVD, supple, symmetrical, trachea midline and thyroid not enlarged, symmetric, no tenderness/mass/nodules Lungs: diminished breath sounds bilaterally Heart: regular rate and rhythm, S1, S2 normal, no murmur, click, rub or gallop Abdomen: soft, non-tender; bowel sounds normal; no masses,  no organomegaly Extremities: extremities normal, atraumatic, no cyanosis or edema Pulses: 2+ and symmetric Skin: Skin color, texture, turgor normal. No rashes or lesions Neurologic: Grossly normal  EKG: Not performed  ASSESSMENT: 1. Chest pain, atypical for angina 2. Vascular disease 3. Rheumatoid arthritis 4. Hyperlipidemia 5. Hypertension 6. Prior stroke  PLAN: 1.   [name] has number of coronary equivalents and had chest pain which sounded atypical was probably related to pneumonia and/or vomiting. Nonetheless, he should have stress testing both for prognostic significance and to rule out any significant underlying coronary disease. I would recommend a laexiscan nuclear stress test and we'll see him back to review the results shortly. He is to followup with Dr. Arbie Cookey regarding his vascular disease, but that is not urgent.  Chrystie Nose, MD, [facility] Attending Cardiologist [facility]  HILTY,Kenneth C 04/16/2013, 1:25 PM

## 2013-04-28 ENCOUNTER — Ambulatory Visit (HOSPITAL_COMMUNITY)
Admission: RE | Admit: 2013-04-28 | Discharge: 2013-04-28 | Disposition: A | Discharge status: Home / Self Care | Payer: Medicare Other | Source: Ambulatory Visit | Attending: Cardiovascular Disease | Admitting: Cardiovascular Disease

## 2013-04-28 DIAGNOSIS — I739 Peripheral vascular disease, unspecified: Secondary | ICD-10-CM | POA: Insufficient documentation

## 2013-04-28 DIAGNOSIS — I714 Abdominal aortic aneurysm, without rupture, unspecified: Secondary | ICD-10-CM | POA: Insufficient documentation

## 2013-04-28 DIAGNOSIS — Z87891 Personal history of nicotine dependence: Secondary | ICD-10-CM | POA: Insufficient documentation

## 2013-04-28 DIAGNOSIS — R42 Dizziness and giddiness: Secondary | ICD-10-CM | POA: Insufficient documentation

## 2013-04-28 DIAGNOSIS — Z8249 Family history of ischemic heart disease and other diseases of the circulatory system: Secondary | ICD-10-CM | POA: Insufficient documentation

## 2013-04-28 DIAGNOSIS — R0989 Other specified symptoms and signs involving the circulatory and respiratory systems: Secondary | ICD-10-CM | POA: Insufficient documentation

## 2013-04-28 DIAGNOSIS — R5381 Other malaise: Secondary | ICD-10-CM | POA: Insufficient documentation

## 2013-04-28 DIAGNOSIS — Z8673 Personal history of transient ischemic attack (TIA), and cerebral infarction without residual deficits: Secondary | ICD-10-CM | POA: Insufficient documentation

## 2013-04-28 DIAGNOSIS — I10 Essential (primary) hypertension: Secondary | ICD-10-CM | POA: Insufficient documentation

## 2013-04-28 DIAGNOSIS — R0609 Other forms of dyspnea: Secondary | ICD-10-CM | POA: Insufficient documentation

## 2013-04-28 DIAGNOSIS — R5383 Other fatigue: Secondary | ICD-10-CM | POA: Insufficient documentation

## 2013-04-28 DIAGNOSIS — R079 Chest pain, unspecified: Secondary | ICD-10-CM | POA: Insufficient documentation

## 2013-04-28 MED ORDER — AMINOPHYLLINE 25 MG/ML IV SOLN
125.0000 mg | Freq: Once | INTRAVENOUS | Status: AC
  Administered 2013-04-28: 125 mg via INTRAVENOUS

## 2013-04-28 MED ORDER — TECHNETIUM TC 99M SESTAMIBI GENERIC - CARDIOLITE
10.0000 | Freq: Once | INTRAVENOUS | Status: AC | PRN
  Administered 2013-04-28: 10 via INTRAVENOUS

## 2013-04-28 MED ORDER — TECHNETIUM TC 99M SESTAMIBI GENERIC - CARDIOLITE
30.0000 | Freq: Once | INTRAVENOUS | Status: AC | PRN
  Administered 2013-04-28: 30 via INTRAVENOUS

## 2013-04-28 MED ORDER — REGADENOSON 0.4 MG/5ML IV SOLN
0.4000 mg | Freq: Once | INTRAVENOUS | Status: AC
  Administered 2013-04-28: 0.4 mg via INTRAVENOUS

## 2013-04-28 NOTE — Procedures (Addendum)
[facility] [address] [place] [address] [phone]  Cardiology Nuclear Med Study  Brett Castaneda is a 72 y.o. male     MRN : [id number]     DOB: 1940/11/21  Procedure Date: 04/28/2013  Nuclear Med Background Indication for Stress Test:  Evaluation for Ischemia History:  NO PRIOR HISTORY REPORTED Cardiac Risk Factors: CVA, Family History - CAD, History of Smoking, Hypertension, Lipids, PVD and AAA  Symptoms:  Chest Pain, Dizziness, DOE, Fatigue, Light-Headedness and SOB   Nuclear Pre-Procedure Caffeine/Decaff Intake:  1:00am NPO After: 11AM   IV Site: R Forearm  IV 0.9% NS with Angio Cath:  22g  Chest Size (in):  40"  IV Started by: Emmit Pomfret, RN  Height: 5\' 11"  (1.803 m)  Cup Size: n/a  BMI:  Body mass index is 24.56 kg/(m^2). Weight:  176 lb (79.833 kg)   Tech Comments:  N/A    Nuclear Med Study 1 or 2 day study: 1 day  Stress Test Type:  Lexiscan  Order Authorizing Provider:  Zoila Shutter, MD   Resting Radionuclide: Technetium 43m Sestamibi  Resting Radionuclide Dose: 10.8 mCi   Stress Radionuclide:  Technetium 21m Sestamibi  Stress Radionuclide Dose: 31.4 mCi           Stress Protocol Rest HR: 68 Stress HR: 102  Rest BP: 172/96 Stress BP: 159/84  Exercise Time (min): n/a METS: n/a   Predicted Max HR: 150 bpm % Max HR: 68 bpm Rate Pressure Product: 84132  Dose of Adenosine (mg):  n/a Dose of Lexiscan: 0.4 mg  Dose of Atropine (mg): n/a Dose of Dobutamine: n/a mcg/kg/min (at max HR)  Stress Test Technologist: Esperanza Sheets, CCT Nuclear Technologist: Koren Shiver, CNMT   Rest Procedure:  Myocardial perfusion imaging was performed at rest 45 minutes following the intravenous administration of Technetium 26m Sestamibi. Stress Procedure:  The patient received IV Lexiscan 0.4 mg over 15-seconds.  Technetium 36m Sestamibi injected at 30-seconds.  The patient experienced SOB and nausea; 75 mg of IV  Aminophylline was administered with resolution of symptoms.  There were no significant changes with Lexiscan.  Quantitative spect images were obtained after a 45 minute delay.  Transient Ischemic Dilatation (Normal <1.22):  0.96  Lung/Heart Ratio (Normal <0.45):  0.33 QGS EDV:  84 ml QGS ESV:  27 ml LV Ejection Fraction: 68%  Signed by     Rest ECG: NSR - Normal EKG  Stress ECG: No significant change from baseline ECG  QPS Raw Data Images:  Normal; no motion artifact; normal heart/lung ratio. Stress Images:  Small in size,mild in intensity apical lateral and apical anteroseptal defect Rest Images:  Normal homogeneous uptake in all areas of the myocardium. Subtraction (SDS):  Small region of apical ishemia (extent 7%)  Impression Exercise Capacity:  Lexiscan with no exercise. BP Response:  Normal blood pressure response. Clinical Symptoms:  Mild shortness of breath  ECG Impression:  No significant ST segment change suggestive of ischemia. Comparison with Prior Nuclear Study: No images to compare  Overall Impression:  Low risk stress nuclear study demonstrating a possible small region of apical anteroseptal and apical lateral inferior ischemia.  LV Wall Motion:  Normal LV function; EF 68%, Normal wall motion.   Brett Bihari, MD  04/28/2013 6:55 PM

## 2013-04-29 ENCOUNTER — Telehealth: Payer: Self-pay | Admitting: Internal Medicine

## 2013-04-29 NOTE — Telephone Encounter (Signed)
Returned call.  Pt stated he wanted to know if anything is wrong from the test.  Result given per MD.  Pt verbalized understanding.  Pt stated he is not on any medications.  Pt advised no changes would be made to treatment at this time.  Pt verbalized understanding and agreed w/ plan.

## 2013-04-29 NOTE — Telephone Encounter (Signed)
Patient had stress test yesterday Eugenie Birks).  Today he is having leg cramps.  Could the cramps be due to the stress test??

## 2013-04-29 NOTE — Progress Notes (Signed)
Patient called and spoke with Joice Lofts, RN, who gave test results to patient.

## 2013-05-22 ENCOUNTER — Telehealth: Payer: Self-pay | Admitting: Internal Medicine

## 2013-05-22 ENCOUNTER — Encounter: Payer: Self-pay | Admitting: Internal Medicine

## 2013-05-22 ENCOUNTER — Ambulatory Visit (INDEPENDENT_AMBULATORY_CARE_PROVIDER_SITE_OTHER): Payer: Medicare Other | Admitting: Internal Medicine

## 2013-05-22 VITALS — BP 170/100 | HR 71 | Temp 98.7°F | Resp 20 | Wt 182.0 lb

## 2013-05-22 DIAGNOSIS — E785 Hyperlipidemia, unspecified: Secondary | ICD-10-CM

## 2013-05-22 DIAGNOSIS — I10 Essential (primary) hypertension: Secondary | ICD-10-CM

## 2013-05-22 DIAGNOSIS — R5381 Other malaise: Secondary | ICD-10-CM

## 2013-05-22 DIAGNOSIS — M069 Rheumatoid arthritis, unspecified: Secondary | ICD-10-CM

## 2013-05-22 LAB — COMPREHENSIVE METABOLIC PANEL
Alkaline Phosphatase: 55 U/L (ref 39–117)
BUN: 11 mg/dL (ref 6–23)
Glucose, Bld: 91 mg/dL (ref 70–99)
Sodium: 138 mEq/L (ref 135–145)
Total Bilirubin: 0.5 mg/dL (ref 0.3–1.2)
Total Protein: 7 g/dL (ref 6.0–8.3)

## 2013-05-22 LAB — CBC WITH DIFFERENTIAL/PLATELET
Eosinophils Relative: 2.8 % (ref 0.0–5.0)
HCT: 36.3 % — ABNORMAL LOW (ref 39.0–52.0)
Hemoglobin: 12.1 g/dL — ABNORMAL LOW (ref 13.0–17.0)
Lymphs Abs: 2.5 10*3/uL (ref 0.7–4.0)
Monocytes Relative: 9.8 % (ref 3.0–12.0)
Neutro Abs: 3.5 10*3/uL (ref 1.4–7.7)
WBC: 6.9 10*3/uL (ref 4.5–10.5)

## 2013-05-22 LAB — SEDIMENTATION RATE: Sed Rate: 29 mm/hr — ABNORMAL HIGH (ref 0–22)

## 2013-05-22 NOTE — Patient Instructions (Signed)
Limit your sodium (Salt) intake  Please check your blood pressure on a regular basis.  If it is consistently greater than 150/90, please make an office appointment.  Return in one month for follow-up  

## 2013-05-22 NOTE — Telephone Encounter (Signed)
Patient Information:  Caller Name: Gaynelle Adu  Phone: [phone]  Patient: Brett Castaneda, Brett Castaneda  Gender: Male  DOB: [birth year]  Age: 72 Years  PCP: Eleonore Chiquito (Family Practice > 50yrs old)  Office Follow Up:  Does the office need to follow up with this patient?: No  Instructions For The Office: N/A   Symptoms  Reason For Call & Symptoms: caller reports pt has no energy.  Pt is having calf pain.  Caller states that pt has had these symptoms for a couple months but now it is getting worse  Reviewed Health History In EMR: Yes  Reviewed Medications In EMR: Yes  Reviewed Allergies In EMR: Yes  Reviewed Surgeries / Procedures: Yes  Date of Onset of Symptoms: Unknown  Guideline(s) Used:  Weakness (Generalized) and Fatigue  Disposition Per Guideline:   Go to Office Now  Reason For Disposition Reached:   Moderate weakness (i.e., interferes with work, school, normal activities) and cause unknown  Advice Given:  N/A  Patient Will Follow Care Advice:  YES  Appointment Scheduled:  05/22/2013 13:00:00 Appointment Scheduled Provider:  Eleonore Chiquito (Family Practice > 22yrs old)

## 2013-05-22 NOTE — Progress Notes (Signed)
Subjective:    Patient ID: Brett Castaneda, male    DOB: January 10, 1941, 71 y.o.   MRN: [id number]  HPI  72 year old patient who has a history of rheumatoid arthritis. He was hospitalized last month for community acquired pneumonia and has not fully recovered. He complains of the significant fatigue. He states that he has a difficult time ambulating do to weakness in the legs. He is followed by rheumatology for rheumatoid arthritis. He also describes some cramping in the calves with standing and walking but this does not occur in a sitting or supine position. There's been no fever or other constitutional complaints. Medical regimen does include methotrexate. He states that there is a history of hypothyroidism and his family. Last TSH was greater than 2 years ago  Past Medical History  Diagnosis Date  . BASAL CELL CARCINOMA, FACE 03/31/2009  . CEREBROVASCULAR ACCIDENT, HX OF 03/24/2007  . CONSTIPATION 06/22/2008  . DIVERTICULOSIS, COLON 03/24/2007  . GERD 03/24/2007  . HYPERLIPIDEMIA 08/27/2007  . HYPERTENSION 12/14/2008  . Rheumatoid arthritis(714.0) 03/24/2007  . Blind right eye   . Retinal detachment     hx of  . Stroke   . Keratosis Oct. 2013    History   Social History  . Marital Status: Married    Spouse Name: N/A    Number of Children: N/A  . Years of Education: N/A   Occupational History  . Not on file.   Social History Main Topics  . Smoking status: Former Smoker -- 23 years    Types: Cigarettes    Quit date: 09/24/1980  . Smokeless tobacco: Never Used  . Alcohol Use: No  . Drug Use: No  . Sexual Activity: Not on file   Other Topics Concern  . Not on file   Social History Narrative  . No narrative on file    Past Surgical History  Procedure Laterality Date  . Total knee arthroplasty      bital  . Eye surgery      retinal detachment  . Carpal tunnel release    . Carotid endarterectomy    . Cataract extraction  1980    removed cataract in left eye. Currently blind in  right eye  . Cholecystectomy  2001    Gall Bladder  . Retinal detachment surgery  2004    Family History  Problem Relation Age of Onset  . Stroke Mother 24  . Heart disease Father   . Coronary artery disease Sister   . Heart disease Sister   . Diabetes Sister   . Hypertension Sister     Allergies  Allergen Reactions  . Dopamine Anaphylaxis  . Morphine And Related Other (See Comments)    Hallucinations, very bad reactions  . Vicodin [Hydrocodone-Acetaminophen] Nausea And Vomiting    Current Outpatient Prescriptions on File Prior to Visit  Medication Sig Dispense Refill  . aspirin 325 MG tablet Take 325 mg by mouth daily.       . chlorpheniramine (CHLOR-TRIMETON) 4 MG tablet Take 4 mg by mouth daily as needed for allergies.      . FOLBIC 2.5-25-2 MG TABS take 1 tablet by mouth once daily  90 tablet  1  . latanoprost (XALATAN) 0.005 % ophthalmic solution Place 1 drop into the left eye at bedtime.       . methotrexate (RHEUMATREX) 2.5 MG tablet Take 10 mg by mouth 2 (two) times a week. Saturday and Sunday; Caution:Chemotherapy. Protect from light.      . polyethylene  glycol (MIRALAX / GLYCOLAX) packet Take 17 g by mouth daily as needed (for constipation).      . timolol (TIMOPTIC) 0.5 % ophthalmic solution Place 1 drop into the left eye daily.        No current facility-administered medications on file prior to visit.    BP 170/100  Pulse 71  Temp(Src) 98.7 F (37.1 C) (Oral)  Resp 20  Wt 182 lb (82.555 kg)  BMI 25.4 kg/m2  SpO2 98%       Review of Systems  Constitutional: Positive for fatigue. Negative for fever, chills and appetite change.  HENT: Negative for hearing loss, ear pain, congestion, sore throat, trouble swallowing, neck stiffness, dental problem, voice change and tinnitus.   Eyes: Negative for pain, discharge and visual disturbance.  Respiratory: Negative for cough, chest tightness, wheezing and stridor.   Cardiovascular: Negative for chest pain,  palpitations and leg swelling.  Gastrointestinal: Negative for nausea, vomiting, abdominal pain, diarrhea, constipation, blood in stool and abdominal distention.  Genitourinary: Negative for urgency, hematuria, flank pain, discharge, difficulty urinating and genital sores.  Musculoskeletal: Positive for gait problem. Negative for myalgias, back pain, joint swelling and arthralgias.  Skin: Negative for rash.  Neurological: Positive for weakness. Negative for dizziness, syncope, speech difficulty, numbness and headaches.  Hematological: Negative for adenopathy. Does not bruise/bleed easily.  Psychiatric/Behavioral: Negative for behavioral problems and dysphoric mood. The patient is not nervous/anxious.        Objective:   Physical Exam  Constitutional: He is oriented to person, place, and time. He appears well-developed.  Alert and appropriate. Afebrile does not appear to be in any distress Blood pressure 160/95  HENT:  Head: Normocephalic.  Right Ear: External ear normal.  Left Ear: External ear normal.  Eyes: Conjunctivae and EOM are normal.  Neck: Normal range of motion.  Cardiovascular: Normal rate and normal heart sounds.   Pulmonary/Chest: Breath sounds normal.  A few basilar crackles O2 saturation 98  Abdominal: Bowel sounds are normal.  Musculoskeletal: Normal range of motion. He exhibits no edema and no tenderness.  Neurological: He is alert and oriented to person, place, and time. He has normal reflexes.  Appears generally weak but nonfocal Reflexes are brisk and symmetrical  Psychiatric: He has a normal mood and affect. His behavior is normal.          Assessment & Plan:   Weakness and fatigue. Unclear etiology. We'll check some updated lab Hypertension. The blood pressure is elevated today but presently is not on therapy. Will observe over the next month and reassess at that time. Home blood pressure monitoring encouraged Rheumatoid arthritis Family history of  hypothyroidism. We'll check a TSH

## 2013-05-27 ENCOUNTER — Other Ambulatory Visit: Payer: Self-pay | Admitting: Rheumatology

## 2013-05-27 DIAGNOSIS — M545 Low back pain: Secondary | ICD-10-CM

## 2013-05-30 ENCOUNTER — Ambulatory Visit
Admission: RE | Admit: 2013-05-30 | Discharge: 2013-05-30 | Disposition: A | Discharge status: Home / Self Care | Payer: Medicare Other | Source: Ambulatory Visit | Attending: Rheumatology | Admitting: Rheumatology

## 2013-05-30 DIAGNOSIS — M545 Low back pain, unspecified: Secondary | ICD-10-CM

## 2013-06-01 ENCOUNTER — Other Ambulatory Visit: Payer: Self-pay | Admitting: Internal Medicine

## 2013-06-04 ENCOUNTER — Encounter: Payer: Self-pay | Admitting: Rheumatology

## 2013-06-04 ENCOUNTER — Encounter (INDEPENDENT_AMBULATORY_CARE_PROVIDER_SITE_OTHER): Payer: Medicare Other | Admitting: Vascular Surgery

## 2013-06-04 DIAGNOSIS — I70219 Atherosclerosis of native arteries of extremities with intermittent claudication, unspecified extremity: Secondary | ICD-10-CM

## 2013-06-15 ENCOUNTER — Telehealth: Payer: Self-pay | Admitting: Internal Medicine

## 2013-06-15 NOTE — Telephone Encounter (Signed)
Pt's wife calling to report that pt is still experiencing weakness in legs and has fallen twice over the weekend.  Pt was referred to Dr. Kellie Simmering due to hx of RA.  However, testing was done and nothing significant was found  Pt needs to know what are the next treatment options that PCP recommends since pt is not getting better.

## 2013-06-15 NOTE — Telephone Encounter (Signed)
rov this week for reassessment and possible referral

## 2013-06-15 NOTE — Telephone Encounter (Signed)
Spoke to pt and wife told them to call back in the morning and schedule an appt per Dr.K for this week. Pt verbalized understanding.

## 2013-06-17 ENCOUNTER — Ambulatory Visit (INDEPENDENT_AMBULATORY_CARE_PROVIDER_SITE_OTHER): Payer: Medicare Other | Admitting: Internal Medicine

## 2013-06-17 ENCOUNTER — Encounter: Payer: Self-pay | Admitting: Internal Medicine

## 2013-06-17 VITALS — BP 140/86 | HR 75 | Temp 98.1°F | Resp 20 | Wt 181.0 lb

## 2013-06-17 DIAGNOSIS — M069 Rheumatoid arthritis, unspecified: Secondary | ICD-10-CM

## 2013-06-17 DIAGNOSIS — I10 Essential (primary) hypertension: Secondary | ICD-10-CM

## 2013-06-17 DIAGNOSIS — E785 Hyperlipidemia, unspecified: Secondary | ICD-10-CM

## 2013-06-17 DIAGNOSIS — R29898 Other symptoms and signs involving the musculoskeletal system: Secondary | ICD-10-CM

## 2013-06-17 NOTE — Patient Instructions (Signed)
Limit your sodium (Salt) intake    It is important that you exercise regularly, at least 20 minutes 3 to 4 times per week.  If you develop chest pain or shortness of breath seek  medical attention.  Nerve conduction studies as discussed  Return in one month for follow-up

## 2013-06-17 NOTE — Progress Notes (Signed)
Subjective:    Patient ID: Brett Castaneda, male    DOB: 26-Dec-1940, 72 y.o.   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 He complains of excessive daytime sleepiness cold intolerance in general decreased energy.  Leg weakness seems to predominate. He says that he fell twice recently. He also describes some cramping in both calves. He has been seen by rheumatology for rheumatoid arthritis. A lumbar MRI revealed mild spinal stenosis and osteoarthritic changes but no significant findings. He also had vascular studies performed that revealed a normal ABI. A nuclear stress test also has been normal.    Past Medical History  Diagnosis Date  . BASAL CELL CARCINOMA, FACE 03/31/2009  . CEREBROVASCULAR ACCIDENT, HX OF 03/24/2007  . CONSTIPATION 06/22/2008  . DIVERTICULOSIS, COLON 03/24/2007  . GERD 03/24/2007  . HYPERLIPIDEMIA 08/27/2007  . HYPERTENSION 12/14/2008  . Rheumatoid arthritis(714.0) 03/24/2007  . Blind right eye   . Retinal detachment     hx of  . Stroke   . Keratosis Oct. 2013    History   Social History  . Marital Status: Married    Spouse Name: N/A    Number of Children: N/A  . Years of Education: N/A   Occupational History  . Not on file.   Social History Main Topics  . Smoking status: Former Smoker -- 23 years    Types: Cigarettes    Quit date: 09/24/1980  . Smokeless tobacco: Never Used  . Alcohol Use: No  . Drug Use: No  . Sexual Activity: Not on file   Other Topics Concern  . Not on file   Social History Narrative  . No narrative on file    Past Surgical History  Procedure Laterality Date  . Total knee arthroplasty      bital  . Eye surgery      retinal detachment  . Carpal tunnel release    . Carotid endarterectomy    . Cataract extraction  1980    removed cataract in left eye. Currently  blind in right eye  . Cholecystectomy  2001    Gall Bladder  . Retinal detachment surgery  2004    Family History  Problem Relation Age of Onset  . Stroke Mother 53  . Heart disease Father   . Coronary artery disease Sister   . Heart disease Sister   . Diabetes Sister   . Hypertension Sister     Allergies  Allergen Reactions  . Dopamine Anaphylaxis  . Morphine And Related Other (See Comments)    Hallucinations, very bad reactions  . Vicodin [Hydrocodone-Acetaminophen] Nausea And Vomiting    Current Outpatient Prescriptions on File Prior to Visit  Medication Sig Dispense Refill  . aspirin 325 MG tablet Take 325 mg by mouth daily.       . chlorpheniramine (CHLOR-TRIMETON) 4 MG tablet Take 4 mg by mouth daily as needed for allergies.      Marland Kitchen latanoprost (XALATAN) 0.005 % ophthalmic solution Place 1 drop into the left eye at bedtime.       . methotrexate (RHEUMATREX) 2.5 MG tablet Take 10 mg by mouth 2 (two) times a week. Saturday and Sunday; Caution:Chemotherapy. Protect from light.      . polyethylene glycol (MIRALAX / GLYCOLAX) packet Take 17 g by mouth daily as needed (for constipation).      Marland Kitchen  timolol (TIMOPTIC) 0.5 % ophthalmic solution Place 1 drop into the left eye daily.       Marland Kitchen VIRT-VITE FORTE 2.5-25-2 MG TABS take 1 tablet by mouth once daily  90 tablet  0   No current facility-administered medications on file prior to visit.    BP 140/86  Pulse 75  Temp(Src) 98.1 F (36.7 C) (Oral)  Resp 20  Wt 181 lb (82.101 kg)  BMI 25.26 kg/m2  SpO2 95%       Review of Systems  Constitutional: Positive for activity change and fatigue. Negative for fever, chills and appetite change.  HENT: Negative for hearing loss, ear pain, congestion, sore throat, trouble swallowing, neck stiffness, dental problem, voice change and tinnitus.   Eyes: Negative for pain, discharge and visual disturbance.  Respiratory: Negative for cough, chest tightness, wheezing and stridor.    Cardiovascular: Negative for chest pain, palpitations and leg swelling.  Gastrointestinal: Negative for nausea, vomiting, abdominal pain, diarrhea, constipation, blood in stool and abdominal distention.  Genitourinary: Negative for urgency, hematuria, flank pain, discharge, difficulty urinating and genital sores.  Musculoskeletal: Positive for gait problem. Negative for myalgias, back pain, joint swelling and arthralgias.  Skin: Negative for rash.  Neurological: Negative for dizziness, syncope, speech difficulty, weakness, numbness and headaches.  Hematological: Negative for adenopathy. Does not bruise/bleed easily.  Psychiatric/Behavioral: Negative for behavioral problems and dysphoric mood. The patient is not nervous/anxious.        Objective:   Physical Exam  Constitutional: He is oriented to person, place, and time. He appears well-developed.  130/80  HENT:  Head: Normocephalic.  Right Ear: External ear normal.  Left Ear: External ear normal.  Eyes: Conjunctivae and EOM are normal.  Neck: Normal range of motion.  Cardiovascular: Normal rate, normal heart sounds and intact distal pulses.   Pulmonary/Chest: Breath sounds normal.  Abdominal: Bowel sounds are normal.  Musculoskeletal: Normal range of motion. He exhibits no edema and no tenderness.  Negative straight leg test  Neurological: He is alert and oriented to person, place, and time. He has normal reflexes. No cranial nerve deficit. Coordination normal.  Able to raise either leg off examining table in a supine position without difficulty Able to hold both legs against gravity  Weakness in dorsi and plantar flexion of the feet-patient is unable to walk on his toes or his heels  Patellar and Achilles reflexes are normal Plantar responses flexor  Psychiatric: He has a normal mood and affect. His behavior is normal.          Assessment & Plan:   Generalized weakness but lower chimney weakness predominates especially  peripherally with dorsi and plantar flexion weakness. We'll check a CPK as well as nerve conduction studies. We'll consider neurological referral Rheumatoid arthritis Hypertension

## 2013-06-18 ENCOUNTER — Other Ambulatory Visit: Payer: Self-pay | Admitting: Internal Medicine

## 2013-06-18 DIAGNOSIS — R29898 Other symptoms and signs involving the musculoskeletal system: Secondary | ICD-10-CM

## 2013-06-19 ENCOUNTER — Ambulatory Visit: Payer: Self-pay | Admitting: Internal Medicine

## 2013-06-19 ENCOUNTER — Telehealth: Payer: Self-pay | Admitting: Internal Medicine

## 2013-06-19 NOTE — Telephone Encounter (Signed)
Wife would like results of creatine labs done 9/24. pls call Also does pt need to see a a different md for the nerve conduction test or does neuro do this test? She thinks so.

## 2013-06-22 ENCOUNTER — Ambulatory Visit: Payer: Medicare Other | Admitting: Internal Medicine

## 2013-06-22 NOTE — Telephone Encounter (Signed)
Spoke to pt's wife Gaynelle Adu, told her Creatinine was normal and someone will be contacting them for Neurology referral appt and it is different from Nerve Conduction testing. Robbie verbalized understanding.

## 2013-06-24 DIAGNOSIS — I639 Cerebral infarction, unspecified: Secondary | ICD-10-CM

## 2013-06-24 HISTORY — DX: Cerebral infarction, unspecified: I63.9

## 2013-06-26 ENCOUNTER — Telehealth: Payer: Self-pay | Admitting: Neurology

## 2013-06-26 NOTE — Telephone Encounter (Signed)
Pt's wife called, pt fell this AM on the sidewalk. Wife fears his balance and walking is getting work, She wants to know if we can do anything to get him in sooner for his NCV/EMG. He is currently scheduled for 07/02/13. There are no openings on Monday but I thought I'd let you know, so we could see if we can do anything at another time. [phone]# [phone] / Sherri S.

## 2013-06-26 NOTE — Telephone Encounter (Signed)
Noted.  Can we add him on the schedule for Tuesday at 10am? Otherwise it would be Wednesday morning at 10am. Thanks.

## 2013-06-29 ENCOUNTER — Telehealth: Payer: Self-pay | Admitting: Internal Medicine

## 2013-06-29 NOTE — Telephone Encounter (Signed)
Please schedule

## 2013-06-29 NOTE — Telephone Encounter (Signed)
Pt fell friday am and hurt his nose,forhead above eye,,cheek  and knee.  Pt went to eye md today , which advised pt to fu w/ pcp for perhaps an antiobiotic. No appt tomorrow except SD, pt have nerve conduction test on wed in the am. It is ok to schedule?

## 2013-06-30 ENCOUNTER — Encounter: Payer: Self-pay | Admitting: Internal Medicine

## 2013-06-30 ENCOUNTER — Ambulatory Visit (INDEPENDENT_AMBULATORY_CARE_PROVIDER_SITE_OTHER): Payer: Medicare Other | Admitting: Internal Medicine

## 2013-06-30 VITALS — BP 150/90 | HR 106 | Temp 98.2°F | Resp 20 | Wt 185.0 lb

## 2013-06-30 DIAGNOSIS — M069 Rheumatoid arthritis, unspecified: Secondary | ICD-10-CM

## 2013-06-30 DIAGNOSIS — R2681 Unsteadiness on feet: Secondary | ICD-10-CM

## 2013-06-30 DIAGNOSIS — Z8679 Personal history of other diseases of the circulatory system: Secondary | ICD-10-CM

## 2013-06-30 DIAGNOSIS — R269 Unspecified abnormalities of gait and mobility: Secondary | ICD-10-CM

## 2013-06-30 DIAGNOSIS — Z23 Encounter for immunization: Secondary | ICD-10-CM

## 2013-06-30 DIAGNOSIS — I10 Essential (primary) hypertension: Secondary | ICD-10-CM

## 2013-06-30 NOTE — Patient Instructions (Signed)
Neurology evaluation as discussed Nerve conduction studies as scheduled

## 2013-06-30 NOTE — Telephone Encounter (Signed)
Pt aware.

## 2013-06-30 NOTE — Progress Notes (Signed)
  Subjective:    Patient ID: Brett Castaneda, male    DOB: [birth year], 72 y.o.   MRN: [id number]  HPI  61/7.  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 He complains of excessive daytime sleepiness cold intolerance in general decreased energy. Leg weakness seems to predominate. He says that he fell twice recently. He also describes some cramping in both calves.  He has been seen by rheumatology for rheumatoid arthritis. A lumbar MRI revealed mild spinal stenosis and osteoarthritic changes but no significant findings. He also had vascular studies performed that revealed a normal ABI. A nuclear stress test also has been normal.   10/7. Patient was seen today after a fall 4 days ago. He tripped and fell forward sustaining facial trauma and right knee trauma. He also landed on his hands to buffer the fall. He did see his ophthalmologist yesterday.  He is scheduled for nerve conduction studies in the morning   Review of Systems  Musculoskeletal: Positive for gait problem.  Neurological: Positive for weakness. Negative for numbness.       Objective:   Physical Exam  Constitutional: He is oriented to person, place, and time. He appears well-developed and well-nourished. No distress.  Eyes:  Tenderness over the left infraorbital area with mild soft tissue swelling  Neurological: He is alert and oriented to person, place, and time.  Marked weakness in foot flexion and extension Patellar reflexes normal Achilles reflexes depressed  Hip flexors appear to be intact  Denies any sensory changes  Plantar response flexor  Skin:  Abrasions over the bridge of the nose and right knee           Assessment & Plan:   Gait abnormality secondary to bilateral weakness in foot flexors and extensors flexion and extension. Recent normal lumbar MRI. We'll  proceed with nerve conduction studies in the morning and schedule a neurological evaluation Hypertension

## 2013-07-01 ENCOUNTER — Ambulatory Visit (INDEPENDENT_AMBULATORY_CARE_PROVIDER_SITE_OTHER): Payer: Medicare Other | Admitting: Neurology

## 2013-07-01 ENCOUNTER — Encounter: Payer: Self-pay | Admitting: Neurology

## 2013-07-01 DIAGNOSIS — R209 Unspecified disturbances of skin sensation: Secondary | ICD-10-CM

## 2013-07-01 DIAGNOSIS — G608 Other hereditary and idiopathic neuropathies: Secondary | ICD-10-CM

## 2013-07-01 DIAGNOSIS — R269 Unspecified abnormalities of gait and mobility: Secondary | ICD-10-CM

## 2013-07-01 NOTE — Progress Notes (Signed)
See procedure note for EMG results.  Amrutha Avera K. Meher Kucinski, DO  

## 2013-07-02 ENCOUNTER — Telehealth: Payer: Self-pay | Admitting: Internal Medicine

## 2013-07-02 ENCOUNTER — Encounter: Payer: Self-pay | Admitting: Neurology

## 2013-07-02 DIAGNOSIS — R2681 Unsteadiness on feet: Secondary | ICD-10-CM

## 2013-07-02 NOTE — Telephone Encounter (Signed)
Pt wife is calling concerning getting MRI of brain for her husband. Pt has unsteady gait and falling alot. Pt fell yesterday

## 2013-07-02 NOTE — Telephone Encounter (Signed)
Called and spoke with pt's wife and she is aware that an MRI was ordered.

## 2013-07-02 NOTE — Telephone Encounter (Signed)
Please schedule a brain MRI  due to instability

## 2013-07-02 NOTE — Telephone Encounter (Signed)
MRI ordered

## 2013-07-06 NOTE — Procedures (Signed)
[facility]  [address] [place], [address]  [place], [place] [address] Tel: [phone] Fax:  [phone] Test Date:  07/01/2013  Patient: Brett Castaneda DOB: 07-28-1941 Physician: Nita Sickle, DO  Sex: Male Height: 5\' 11"  Ref Phys:   ID#: [id number]   Technician:    Patient Complaints: 72 year-old male presenting with 55-month history of bilateral leg weakness and falls.  NCV & EMG Findings: Extensive evaluation of the right upper and lower extremities reveals: 1. Absent ulnar sensory response and prolonged median sensory response (4.2 ms) with preserved amplitude. 2. Absent sural and superficial peroneal sensory responses. 3. Reduced amplitude of the median (3.0 mV) and ulnar motor response (4.6 mV) with prolonged latency (3.8 ms).  Ulnar conduction velocity is slowed at the elbow (A Elbow-B Elbow, 38 m/s). 4. Tibial motor response is absent.  Peroneal motor response is absent at the extensor digitorum brevis and markedly reduced (1.3 mV) with prolonged latency (4.8 ms) at the tibialis anterior. 5. Absent ulnar F-wave and prolonged bilateral H-reflexes. 6. In the legs, chronic motor axon loss changes affecting nearly all the muscles distal to mid-thigh, with active motor axon loss limited to L5 > S1-innervated muscles.  7. Chronic motor axon loss changes affecting C7-T1-myotomes with active changes isolated to flexor pollicis longus, abductor pollicis brevis, and flexor digitorum profundus 4+5.   Impression: In summary, this is a complex NCS/EMG study with findings as follows: 1. 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  Overall, these changes are moderately severe in degree electrically.   2. Alternatively, multilevel intraspinal canal lesions (i.e. radiculopathy, anterior horn cell disorder) cannot be excluded and repeat EMG in 3-6 months may be indicated to evaluate to  degree of ongoing axon motor loss.  3. Right ulnar neuropathy at the elbow, moderate in degree electrically.   ___________________________ Nita Sickle, DO    Nerve Conduction Studies Anti Sensory Summary Table   Site NR Peak (ms) Norm Peak (ms) P-T Amp (V) Norm P-T Amp  Right Median Anti Sensory (2nd Digit)  Wrist    4.2 <3.8 15.8 >10  Right Sup Peroneal Anti Sensory (Ant Lat Mall)  12 cm NR  <4.6  >3  Right Sural Anti Sensory (Lat Mall)  Calf NR  <4.6  >3  Right Ulnar Anti Sensory (5th Digit)  Wrist NR  <3.2  >5   Motor Summary Table   Site NR Onset (ms) Norm Onset (ms) O-P Amp (mV) Norm O-P Amp Site1 Site2 Delta-0 (ms) Dist (cm) Vel (m/s) Norm Vel (m/s)  Right Median Motor (Abd Poll Brev)  Wrist    4.5 <4.0 3.0 >5 Elbow Wrist 5.8 32.0 55 >50  Elbow    10.3  2.7         Right Peroneal Motor (Ext Dig Brev)  Ankle NR  <6.0  >2.5 B Fib Ankle  43.0  >40  B Fib NR     Poplt B Fib  10.0  >40  Poplt NR            Right Peroneal TA Motor (Tib Ant)  Fib Head    4.8 <4.5 1.3 >3 Poplit Fib Head 22.7 0.0  >40  Poplit    27.5  1.1         Right Tibial Motor (Abd Hall Brev)  Ankle NR  <6.0  >4 Knee Ankle  42.0  >40  Knee NR  Right Ulnar Motor (Abd Dig Minimi)  Wrist    3.8 <3.1 4.6 >7 B Elbow Wrist 5.2 26.0 50 >50  B Elbow    9.0  3.6  A Elbow B Elbow 2.6 10.0 38 >50  A Elbow    11.6  3.6          F Wave Studies   NR F-Lat (ms) Lat Norm (ms) L-R F-Lat (ms)  Right Ulnar (Mrkrs) (Abd Dig Min)  NR  <33    H Reflex Studies   NR H-Lat (ms) Lat Norm (ms) L-R H-Lat (ms)  Left Tibial (Gastroc)     36.89 <35 3.55  Right Tibial (Gastroc)     40.44 <35 3.55   EMG   Side Muscle Ins Act Fibs Psw Fasc Number Recrt Dur Dur. Amp Amp. Poly Poly. Comment  Right AntTibialis Nml 1+ 2+ Nml 3- Rapid All 1+ All 1+ Most 1+ N/A  Right Gastroc Nml 1+ 2+ Nml 1- Mod Few 1+ Nml Nml Nml Nml N/A  Right Flex Dig Long 1- Nml Nml Nml Nml None - - - - - - ATR  Right VastusLat Nml Nml Nml  Nml 1- Mod-R Some 1+ Nml Nml Some 1+ N/A  Right AdductorLong Nml Nml Nml Nml 1- Mod-R Some 1+ Nml Nml Nml Nml N/A  Right Iliopsoas Nml Nml Nml Nml Nml Nml Nml Nml Nml Nml Nml Nml N/A  Right GluteusMed Nml Nml Nml Nml Nml Nml Nml Nml Nml Nml Nml Nml N/A  Right BicepsFemS Nml Nml Nml Nml 1- Mod Few 1+ Nml Nml Nml Nml N/A  Right Semimembranosus Nml 1+ 2+ Nml Nml Mod-R Some 1+ Some 1+ Nml Nml N/A  Right Lumbo Parasp Low Nml Nml Nml Nml NE - - - - - - - N/A  Right Deltoid Nml Nml Nml Nml Nml Nml Nml Nml Nml Nml Nml Nml N/A  Right Triceps Nml Nml Nml Nml 1- Mod-R Some 1+ Nml Nml Nml Nml N/A  Right Biceps Nml Nml Nml Nml Nml Nml Nml Nml Nml Nml Nml Nml N/A  Right PronatorTeres Nml Nml Nml Nml 2- Rapid Some 1+ Nml Nml Some 1+ N/A  Right FlexPolLong Nml 1+ Nml Nml 2- Rapid Some 1+ Nml Nml Nml Nml N/A  Right Ext Indicis Nml Nml Nml Nml 2- Rapid Many 1+ Nml Nml Nml Nml N/A  Right 1stDorInt Nml Nml Nml Nml 2- Rapid Many 1+ Nml Nml Nml Nml ATR  Right Abd Poll Brev Nml 1+ Nml Nml 3- Rapid Most 1+ Nml Nml Some 1+ N/A  Right ABD Dig Min Nml Nml Nml Nml 3- Rapid Most 1+ Nml Nml Nml Nml N/A  Right FlexDigProf 4,5 Nml 1+ Nml Nml 2- Rapid Some 1+ Nml Nml Nml Nml N/A  Right Cervical Parasp Low Nml Nml Nml Nml NE - - - - - - - N/A      Waveforms:

## 2013-07-07 ENCOUNTER — Ambulatory Visit (INDEPENDENT_AMBULATORY_CARE_PROVIDER_SITE_OTHER): Payer: Medicare Other | Admitting: Neurology

## 2013-07-07 ENCOUNTER — Encounter: Payer: Self-pay | Admitting: Neurology

## 2013-07-07 VITALS — BP 142/74 | HR 78 | Temp 99.1°F | Ht 71.0 in | Wt 178.0 lb

## 2013-07-07 DIAGNOSIS — R296 Repeated falls: Secondary | ICD-10-CM

## 2013-07-07 DIAGNOSIS — G629 Polyneuropathy, unspecified: Secondary | ICD-10-CM | POA: Insufficient documentation

## 2013-07-07 DIAGNOSIS — Z9181 History of falling: Secondary | ICD-10-CM

## 2013-07-07 DIAGNOSIS — R269 Unspecified abnormalities of gait and mobility: Secondary | ICD-10-CM

## 2013-07-07 DIAGNOSIS — G609 Hereditary and idiopathic neuropathy, unspecified: Secondary | ICD-10-CM

## 2013-07-07 LAB — CREATININE, SERUM: Creatinine, Ser: 1 mg/dL (ref 0.4–1.5)

## 2013-07-07 NOTE — Patient Instructions (Addendum)
1.  Blood work today 2.  MRI cervical spine and MRI of the brain: Scheduled at [facility] Radiology department 07/14/13 12:00pm. Please arrive at 11:45am to the Radiology department. If you need to reschedule please call [phone]. 3.  Lumbar puncture under radiology: Please go directly to Short Stay department after the Radiology department after MRI on 07/14/13. Nothing by mouth after midnight. Be prepared to be in the department for 5-8 hours for this test.  4.  Physical therapy for gait training. Neurorehab will contact you regarding this appointment.  5.  Return to clinic 3 weeks

## 2013-07-07 NOTE — Progress Notes (Signed)
[facility] Clinic Note - Initial Visit   Date: 07/07/2013   Brett Castaneda   Dear Dr Amador Cunas:  Thank you for your kind referral of Brett Castaneda for consultation of falls. Although his history is well known to you, please allow [place] to reiterate it for the purpose of our medical record. The patient was accompanied to the clinic by wife.   History of Present Illness: Brett Castaneda is a 72 y.o. year-old right-handed Caucasian male with history of HPL, HTN, RA (on MTX, intermittently on steroids), GERD, right retinal detachment, and stroke (2008, mild residual left sided weakness), and right carotid stenosis s/p CEA (2008) presenting for evaluation of falls.    Mr. Castaneda has always been physically active and even last year was cutting firewood.  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  However, during the summer 2013, he started developing bilateral leg weakness and walking problems.  His wife recall that he became acutely ill on 04/08/2013 because she was going with her husband to get her driver's license, he started feeling nauseated and sick.  When she drove them home, he was took weak to get out of the car so she drove him to the Emergency Department.  He was complaining of chest pain, shortness of breath, and back pain.  Work-up was suggestive of community-acquired pneumonia with mild leukocytosis, EKG and cardiac enzymes were unremarkable and he was started on appropriate antibiotics and discharged home.  Since his discharge, he has not been doing well and especially started noticing problems with walking and feeling very unsteady. He has fallen 5 times over the past few months, some of which were severe falls resulting in facial contusions and breaking his eye glasses.  His last two falls occurred when he was on the side-walk.  He feels that he is unable to stop and when he does attempt to walk,  sometimes shuffles backwards.  He endorses weakness of his legs, generalized malaise, and cramping of the legs. No bowel/bladder problems.  Denies any facial weakness, problems with swallowing/talking, muscle twitches, numbness/tingling.   Electronic medical record, and images have been reviewed where available and summarized as:  MRI lumbar spine wo contrast 05/31/2013: Spinal stenosis and degenerative changes most prominent L4-5 level  He also had vascular studies performed that revealed a normal ABI. A nuclear stress test also has been normal.  EMG 07/01/2013: In summary, this is a complex NCS/EMG study with findings as follows:  1. 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 Overall, these changes are moderately severe in degree electrically.  2. Alternatively, multilevel intraspinal canal lesions (i.e. radiculopathy, anterior horn cell disorder) cannot be excluded and repeat EMG in 3-6 months may be indicated to evaluate to degree of ongoing axon motor loss.  3. Right ulnar neuropathy at the elbow, moderate in degree electrically.   Component     Latest Ref Rng 05/22/2013 06/17/2013  TSH     0.35 - 5.50 uIU/mL 0.26 (L)   Sed Rate     0 - 22 mm/hr 29 (H)   CK Total     7 - 232 U/L  102    Past Medical History  Diagnosis Date  . BASAL CELL CARCINOMA, FACE 03/31/2009  . 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 CONSTIPATION 06/22/2008  . DIVERTICULOSIS, COLON  03/24/2007  . GERD 03/24/2007  . HYPERLIPIDEMIA 08/27/2007  . HYPERTENSION 12/14/2008  . Rheumatoid arthritis(714.0) 03/24/2007  . Blind right eye   . Retinal detachment     hx of  . Stroke   . Keratosis Oct. 2013    Past Surgical History  Procedure Laterality Date  . Total knee arthroplasty      bital  . Eye surgery      retinal detachment  . Carpal tunnel release    . Carotid  endarterectomy  2008  . Cataract extraction  1980    removed cataract in left eye. Currently blind in right eye  . Cholecystectomy  2001    Gall Bladder  . Retinal detachment surgery  2004     Medications:  Current Outpatient Prescriptions on File Prior to Visit  Medication Sig Dispense Refill  . aspirin 325 MG tablet Take 325 mg by mouth daily.       . chlorpheniramine (CHLOR-TRIMETON) 4 MG tablet Take 4 mg by mouth daily as needed for allergies.      Marland Kitchen latanoprost (XALATAN) 0.005 % ophthalmic solution Place 1 drop into the left eye at bedtime.       . methotrexate (RHEUMATREX) 2.5 MG tablet Take 10 mg by mouth 2 (two) times a week. Saturday and Sunday; Caution:Chemotherapy. Protect from light.      . polyethylene glycol (MIRALAX / GLYCOLAX) packet Take 17 g by mouth daily as needed (for constipation).      . timolol (TIMOPTIC) 0.5 % ophthalmic solution Place 1 drop into the left eye daily.       Marland Kitchen VIRT-VITE FORTE 2.5-25-2 MG TABS take 1 tablet by mouth once daily  90 tablet  0   No current facility-administered medications on file prior to visit.    Allergies:  Allergies  Allergen Reactions  . Dopamine Anaphylaxis  . Morphine And Related Other (See Comments)    Hallucinations, very bad reactions  . Vicodin [Hydrocodone-Acetaminophen] Nausea And Vomiting    Family History: Family History  Problem Relation Age of Onset  . Stroke Mother 27  . Heart disease Father   . Coronary artery disease Sister   . Heart disease Sister   . Diabetes Sister   . Hypertension Sister     Social History: History   Social History  . Marital Status: Married    Spouse Name: N/A    Number of Children: N/A  . Years of Education: N/A   Occupational History  . Not on file.   Social History Main Topics  . Smoking status: Former Smoker -- 23 years    Types: Cigarettes    Quit date: 09/24/1980  . Smokeless tobacco: Never Used  . Alcohol Use: No  . Drug Use: No  . Sexual Activity: Not  on file   Other Topics Concern  . Not on file   Social History Narrative   Lives with wife of 51 years.  They have four children.  He previously worked in [employer], Lobbyist, as well as other maintenance work.     Review of Systems:  CONSTITUTIONAL: No fevers, chills, night sweats, or weight loss.  + fatigue EYES: No visual changes or eye pain ENT: No hearing changes.  No history of nose bleeds.   RESPIRATORY: No cough, wheezing and shortness of breath.   CARDIOVASCULAR: Negative for chest pain, and palpitations.   GI: Negative for abdominal discomfort, blood in stools or black stools.  No recent change in bowel habits.   GU:  No history of incontinence.   MUSCLOSKELETAL: + joint pain or swelling.   SKIN:+ lesions, rash, and itching.   HEMATOLOGY/ONCOLOGY: Negative for prolonged bleeding, +bruising easily, and swollen nodes.  ENDOCRINE: Negative for cold or heat intolerance, polydipsia or goiter.   PSYCH:  No depression or anxiety symptoms.   NEURO: As Above.   Vital Signs:  BP 142/74  Pulse 78  Temp(Src) 99.1 F (37.3 C)  Ht 5\' 11"  (1.803 m)  Wt 178 lb (80.74 kg)  BMI 24.84 kg/m2  Neurological Exam: MENTAL STATUS including orientation to time, place, person, recent and remote memory, attention span and concentration, language, and fund of knowledge is normal.  Speech is not dysarthric.  CRANIAL NERVES: II:  No visual field defects.  Limited fundoscopic exam of left eye, unable to assess right eye due to lens opacity.   III-IV-VI:  Blind in right eye.  Left pupil is briskly reactive.  Mildly disconjugate gaze with baseline right exotropia.  Extraocular muscles of the left eye are intact.   No nystagmus.  No ptosis prior to or post sustained upgaze.   V:  Normal facial sensation.  Jaw jerk is absent.   VII:  Mild facial weakness of the frontalis, buccinator, orbicularis oculi, and orbicularis oris muscle (5-/5).  Face is symmetric.   Snout and Myerson's signs present.    VIII:   Normal hearing and vestibular function.   IX-X:  Normal palatal movement.   XI:  Normal shoulder shrug and head rotation.   XII:  Mild tongue weakness, normal range of motion, no deviation or fasciculation.  MOTOR:  Significant atrophy of the intrinsic hand muscles (APB, ADM, FDI), moderate TA atrophy bilaterally.  No fasciculations.  No pronator drift.   Right Upper Extremity:    Left Upper Extremity:    Deltoid  5/5   Deltoid  5/5   Biceps  5/5   Biceps  5/5   Triceps  5/5   Triceps  5/5   Wrist extensors  4+/5   Wrist extensors  4+/5   Wrist flexors  4+/5   Wrist flexors  4+/5   Finger extensors  4/5   Finger extensors  4/5   Finger flexors  5/5   Finger flexors  5/5   Dorsal interossei  4/5   Dorsal interossei  4/5   Abductor pollicis  4/5   Abductor pollicis  4/5   Tone (Ashworth scale)  0  Tone (Ashworth scale)  0   Right Lower Extremity:    Left Lower Extremity:    Hip flexors  4/5   Hip flexors  4/5   Hip extensors  5/5   Hip extensors  5/5   Knee flexors  5/5   Knee flexors  5/5   Knee extensors  5-/5   Knee extensors  5-/5   Dorsiflexors  2/5   Dorsiflexors  3/5   Plantarflexors  2/5   Plantarflexors  2/5   Toe extensors  0/5   Toe extensors  1/5   Toe flexors  2/5   Toe flexors  2/5   Tone (Ashworth scale)  0  Tone (Ashworth scale)  0   MSRs:  Right  Left brachioradialis 3+  brachioradialis 3+  biceps 3+  biceps 3+  triceps 3+  triceps 3+  patellar 2+  patellar 2+  ankle jerk 0  ankle jerk 0  Hoffman no  Hoffman no  plantar response mute  plantar response mute   SENSORY:  Vibration is reduced to 20% at knees bilaterally and absent at ankles.  Proprioception impaired at great toe bilaterally.  Light touch, pin prick, and temperature is intact.  Romberg's sign prominently positive. No pseudoathetosis.  COORDINATION/GAIT:  Normal finger-to- nose-finger and heel-to-shin.  Intact rapid alternating movements  bilaterally.  Unable to rise from a chair without using arms.  Gait wide-based, unsteady, and he drags the left foot.  He is too unsteady to perform tandem and stressed gait intact.    IMPRESSION: Mr. Krummel is a 72 year-old gentleman presenting for evaluation of falls and bilateral leg weakness over the past 45-months.  His neurological examination shows atrophy and weakness of his lower extremities, moderately severe distal to proximal pattern of large fiber sensorimotor polyneuropathy, and brisk reflexes of the arms and knee jerks. Imaging of his lumbar spine shows L4-L5 stenosis, and while this may contribute to his weakness, it would not alone explain his symptomology. His EMG showed both axon loss and demyelinating features which is consistent with his clinical findings and given the subacute nature of his symptoms, I would like to obtain CSF testing to look for inflammatory/infiltrative/infectious processes in addition to extensive lab testing.  Because he does have hyperreflexia, I will order imaging of his brain and cervical spine to look for structural disease.  There is no evidence of myopathy on EMG and recent CK is normal.   PLAN/RECOMMENDATIONS:  1.  MRI brain and c-spine wwo contrast 2.  Lumbar puncture - will check routine analysis, OCB, MBP, IgG index, WNV, Lyme, cytology,VDRL, ACE 3.   Check the following labs: Marland Kitchen Vitamin B12  . Methylmalonic acid(mma), rnd urine  . Copper, serum  . Ceruloplasmin  . Creatinine, serum  . Sedimentation rate  . C-reactive protein  . Vitamin B6  . Vitamin B1  . Protein electrophoresis  . IFE, Urine (with Tot Prot)  . Immunofixation electrophoresis  . GM1 Antibody IgG, IgM  . Hemoglobin A1c  . T4, free  . T3, free  . Vitamin E  . Zinc  4.  Referral to physical therapy for gait training 5.  Fall precautions discussed, as he is at high risk for falls 6.  Return to clinic in 3-weeks, or sooner as needed   The duration of this appointment visit  was 80 minutes of face-to-face time with the patient.  Greater than 50% of this time was spent in counseling, explanation of diagnosis, planning of further management, and coordination of care.   Thank you for allowing me to participate in patient's care.  If I can answer any additional questions, I would be pleased to do so.    Sincerely,    Jaimi Belle K. Allena Katz, DO

## 2013-07-08 LAB — CERULOPLASMIN: Ceruloplasmin: 26 mg/dL (ref 20–60)

## 2013-07-09 ENCOUNTER — Ambulatory Visit: Payer: Medicare Other | Attending: Neurology | Admitting: Physical Therapy

## 2013-07-09 ENCOUNTER — Encounter (HOSPITAL_COMMUNITY): Payer: Self-pay | Admitting: Pharmacy Technician

## 2013-07-09 DIAGNOSIS — R269 Unspecified abnormalities of gait and mobility: Secondary | ICD-10-CM | POA: Insufficient documentation

## 2013-07-09 DIAGNOSIS — IMO0001 Reserved for inherently not codable concepts without codable children: Secondary | ICD-10-CM | POA: Insufficient documentation

## 2013-07-09 LAB — UIFE/LIGHT CHAINS/TP QN, 24-HR UR
Albumin, U: DETECTED
Alpha 1, Urine: DETECTED — AB
Alpha 2, Urine: DETECTED — AB
Beta, Urine: DETECTED — AB
Free Kappa Lt Chains,Ur: 17.8 mg/dL — ABNORMAL HIGH (ref 0.14–2.42)
Free Kappa/Lambda Ratio: 16.33 ratio — ABNORMAL HIGH (ref 2.04–10.37)
Free Lambda Lt Chains,Ur: 1.09 mg/dL — ABNORMAL HIGH (ref 0.02–0.67)
Gamma Globulin, Urine: DETECTED — AB
Total Protein, Urine: 19.8 mg/dL

## 2013-07-09 LAB — COPPER, SERUM: Copper: 93 ug/dL (ref 70–175)

## 2013-07-10 ENCOUNTER — Telehealth: Payer: Self-pay | Admitting: Neurology

## 2013-07-10 LAB — IMMUNOFIXATION ELECTROPHORESIS
IgA: 540 mg/dL — ABNORMAL HIGH (ref 68–379)
IgG (Immunoglobin G), Serum: 1680 mg/dL — ABNORMAL HIGH (ref 650–1600)
IgM, Serum: 60 mg/dL (ref 41–251)

## 2013-07-10 NOTE — Telephone Encounter (Signed)
Please call pt regarding MRI / Sherri

## 2013-07-11 LAB — VITAMIN E
Gamma-Tocopherol (Vit E): <0.2 mg/L (ref <=4.3)
Vitamin E (Alpha Tocopherol): 18.9 mg/L (ref 5.7–19.9)

## 2013-07-11 LAB — ZINC: Zinc: 73 ug/dL (ref 60–130)

## 2013-07-13 ENCOUNTER — Ambulatory Visit: Payer: Medicare Other | Admitting: Physical Therapy

## 2013-07-13 ENCOUNTER — Telehealth: Payer: Self-pay

## 2013-07-13 NOTE — Telephone Encounter (Signed)
Returned patient's call.  Questions answered.  Gracey Tolle K. Allena Katz, DO

## 2013-07-13 NOTE — Telephone Encounter (Signed)
Pt has questions regarding MRI results

## 2013-07-14 ENCOUNTER — Other Ambulatory Visit: Payer: Self-pay | Admitting: Neurology

## 2013-07-14 ENCOUNTER — Ambulatory Visit (HOSPITAL_COMMUNITY)
Admission: RE | Admit: 2013-07-14 | Discharge: 2013-07-14 | Disposition: A | Discharge status: Home / Self Care | Payer: Medicare Other | Source: Ambulatory Visit | Attending: Neurology | Admitting: Neurology

## 2013-07-14 ENCOUNTER — Other Ambulatory Visit: Payer: Self-pay | Admitting: Radiology

## 2013-07-14 DIAGNOSIS — G629 Polyneuropathy, unspecified: Secondary | ICD-10-CM

## 2013-07-14 DIAGNOSIS — Z9181 History of falling: Secondary | ICD-10-CM | POA: Insufficient documentation

## 2013-07-14 DIAGNOSIS — R296 Repeated falls: Secondary | ICD-10-CM

## 2013-07-14 DIAGNOSIS — R269 Unspecified abnormalities of gait and mobility: Secondary | ICD-10-CM

## 2013-07-14 DIAGNOSIS — G9389 Other specified disorders of brain: Secondary | ICD-10-CM | POA: Insufficient documentation

## 2013-07-14 HISTORY — PX: MRI: SHX5353

## 2013-07-14 HISTORY — PX: LUMBAR PUNCTURE: SHX1985

## 2013-07-14 LAB — CSF CELL COUNT WITH DIFFERENTIAL

## 2013-07-14 MED ORDER — GADOBENATE DIMEGLUMINE 529 MG/ML IV SOLN
20.0000 mL | Freq: Once | INTRAVENOUS | Status: AC | PRN
  Administered 2013-07-14: 17 mL via INTRAVENOUS

## 2013-07-14 NOTE — Procedures (Signed)
Lumbar puncture performed at L3-4 under fluoroscopy without complication

## 2013-07-15 ENCOUNTER — Other Ambulatory Visit: Payer: Self-pay

## 2013-07-15 ENCOUNTER — Telehealth: Payer: Self-pay | Admitting: Neurology

## 2013-07-15 DIAGNOSIS — I639 Cerebral infarction, unspecified: Secondary | ICD-10-CM

## 2013-07-15 LAB — VDRL, CSF: VDRL Quant, CSF: NONREACTIVE

## 2013-07-15 LAB — HERPES SIMPLEX VIRUS(HSV) DNA BY PCR: HSV 1 DNA: NOT DETECTED

## 2013-07-15 NOTE — Telephone Encounter (Signed)
Called patient to discuss results of MRI brain. There is evidence of a right old frontoparietal stroke which is consistent with his clinical history. They come he had a stroke in 2008 in the setting of carotid stenosis and underwent CEA soon after. I also mention that there is evidence of a possible subacute right pontine stroke.  I have ordered vessel imaging which is scheduled for tomorrow. Initial CSF results are also normal.  All questions were answered.  Donika K. Allena Katz, DO

## 2013-07-16 ENCOUNTER — Ambulatory Visit: Payer: Self-pay | Admitting: Internal Medicine

## 2013-07-16 ENCOUNTER — Other Ambulatory Visit (HOSPITAL_COMMUNITY): Payer: Self-pay

## 2013-07-16 ENCOUNTER — Telehealth: Payer: Self-pay | Admitting: Neurology

## 2013-07-16 ENCOUNTER — Encounter (HOSPITAL_COMMUNITY): Payer: Self-pay

## 2013-07-16 ENCOUNTER — Ambulatory Visit (HOSPITAL_COMMUNITY)
Admission: RE | Admit: 2013-07-16 | Discharge: 2013-07-16 | Disposition: A | Discharge status: Home / Self Care | Payer: Medicare Other | Source: Ambulatory Visit | Attending: Neurology | Admitting: Neurology

## 2013-07-16 DIAGNOSIS — I700 Atherosclerosis of aorta: Secondary | ICD-10-CM | POA: Insufficient documentation

## 2013-07-16 DIAGNOSIS — G9389 Other specified disorders of brain: Secondary | ICD-10-CM | POA: Insufficient documentation

## 2013-07-16 DIAGNOSIS — I639 Cerebral infarction, unspecified: Secondary | ICD-10-CM

## 2013-07-16 DIAGNOSIS — R29898 Other symptoms and signs involving the musculoskeletal system: Secondary | ICD-10-CM | POA: Insufficient documentation

## 2013-07-16 DIAGNOSIS — R918 Other nonspecific abnormal finding of lung field: Secondary | ICD-10-CM | POA: Insufficient documentation

## 2013-07-16 DIAGNOSIS — I771 Stricture of artery: Secondary | ICD-10-CM | POA: Insufficient documentation

## 2013-07-16 DIAGNOSIS — I6529 Occlusion and stenosis of unspecified carotid artery: Secondary | ICD-10-CM | POA: Insufficient documentation

## 2013-07-16 LAB — OTHER SOLSTAS TEST

## 2013-07-16 LAB — METHYLMALONIC ACID(MMA), RND URINE
Creatinine: 15.3 mmol/L (ref 2.38–26.55)
Methylmalonic acid: 1.1 mmol/mol{creat} (ref 0.3–1.9)

## 2013-07-16 LAB — ANGIOTENSIN CONVERTING ENZYME, CSF: Angio Convert Enzyme: 10 U/L (ref <=15)

## 2013-07-16 MED ORDER — IOHEXOL 350 MG/ML SOLN
100.0000 mL | Freq: Once | INTRAVENOUS | Status: AC | PRN
  Administered 2013-07-16: 100 mL via INTRAVENOUS

## 2013-07-16 NOTE — Telephone Encounter (Signed)
Patient has underwent extensive work-up thus far:  MRI brain wwo 07/07/2013:   ?tiny acute infarct right pons.  Old right frontal infarct with encephalomalcia  MRI cervical spine wwo 07/07/2013:  spondylotic changes C4-C5 and C5-C6 on the right with very mild right-sided cord flattening. Thyroid lesions can be assessed/ polyp with thyroid ultrasound.  MRI lumbar spine 05/31/2013: Spinal stenosis and degenerative changes most prominent L4-5 level  CT/A head and neck 07/16/2013:  LICA siphon with calcified plaque, no stenosis.  Mild cervical atherosclerosis.  Right vertebral is patent, but very small in caliber.  Extensive aortic arch and R subclavian artery plaque with ulcerated plaque in the arch.  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  There is note of bilateral upper lung nodules, up to 17 mm (no prior comparison study).   CSF 07/14/2013:  R0 W0 G51 P31  CSF ACE, HSV, cytology, VDRL - neg  CSF WNV, MBP, OCB, IgG index - pending  Serum:  Vitamin B12, vit B1, vit E, MMA, copper, ceruloplasmin, CK, HbA1c, SPEP/UPEP with IFE, zinc  - all normal limits    *Abnormal:  Vitamin B6 elevated 51.7, ESR 52   Based on what we have done so far, the most notable findings are:  (1) possible R pontine stroke, likely small vessel vs embolic from aortic arch calcification.   -  He is already on aspirin, may consider plavix because of ulcerated plaque.  I am not convinced that a pontine stroke is primarily responsible for his progressive gait unsteadiness because his EMG findings suggested more of an acute peripheral nerve process  (2) primarily generalized polyneuropathy.   - Elevated vitamin B6 can cause sensory neuronopathy and present with symptoms like he has, so I have asked him to stop taking exogenous vitamin B6 (currently has 25mg  in his vitamins)  (3)  Abnormal lung nodules on CT.    - He needs CT chest w contrast, because a paraneoplastic process is possible especially with his subacute course.  -  I will see if Dr. Vernon Prey office can help arrange.  I have asked the patient to keep well hydrated, as he received contrast for MRI and CT this week.   - Will plan on sending paraneoplastic panel   Discussed the above with patient and his wife who is in agreement of plan.  Victorine Mcnee K. Allena Katz, DO

## 2013-07-17 ENCOUNTER — Ambulatory Visit: Payer: Medicare Other | Admitting: Rehabilitative and Restorative Service Providers"

## 2013-07-17 LAB — MYELIN BASIC PROTEIN, CSF: Myelin Basic Protein: <2 mcg/L (ref 0.0–4.0)

## 2013-07-17 NOTE — Addendum Note (Signed)
Addended by: Jimmye Norman on: 07/17/2013 09:53 AM   Modules accepted: Orders

## 2013-07-17 NOTE — Telephone Encounter (Signed)
Please schedule a chest CT scan

## 2013-07-18 LAB — CSF IGG: IgG, CSF: 4 mg/dL (ref 0.8–7.7)

## 2013-07-20 ENCOUNTER — Ambulatory Visit: Payer: Medicare Other | Admitting: Physical Therapy

## 2013-07-20 LAB — OLIGOCLONAL BANDS, CSF + SERM

## 2013-07-23 ENCOUNTER — Ambulatory Visit (INDEPENDENT_AMBULATORY_CARE_PROVIDER_SITE_OTHER)
Admission: RE | Admit: 2013-07-23 | Discharge: 2013-07-23 | Disposition: A | Discharge status: Home / Self Care | Payer: Medicare Other | Source: Ambulatory Visit | Attending: Internal Medicine | Admitting: Internal Medicine

## 2013-07-23 ENCOUNTER — Ambulatory Visit: Payer: Medicare Other | Admitting: Physical Therapy

## 2013-07-23 DIAGNOSIS — R918 Other nonspecific abnormal finding of lung field: Secondary | ICD-10-CM

## 2013-07-23 MED ORDER — IOHEXOL 300 MG/ML  SOLN
80.0000 mL | Freq: Once | INTRAMUSCULAR | Status: AC | PRN
  Administered 2013-07-23: 80 mL via INTRAVENOUS

## 2013-07-27 ENCOUNTER — Telehealth: Payer: Self-pay | Admitting: Internal Medicine

## 2013-07-27 ENCOUNTER — Ambulatory Visit: Payer: Medicare Other | Attending: Neurology | Admitting: Physical Therapy

## 2013-07-27 DIAGNOSIS — R269 Unspecified abnormalities of gait and mobility: Secondary | ICD-10-CM | POA: Insufficient documentation

## 2013-07-27 DIAGNOSIS — IMO0001 Reserved for inherently not codable concepts without codable children: Secondary | ICD-10-CM | POA: Insufficient documentation

## 2013-07-27 NOTE — Telephone Encounter (Signed)
Spoke to pt told him CT scan was normal except for some mild scarring, no lung masses. Pt verbalized understanding. Asked pt what he wanted to know about lyme disease? He said did not need it daughter looked it up on the internet. Told him okay.

## 2013-07-27 NOTE — Telephone Encounter (Signed)
Please call/notify patient that lab/test/procedure is unremarkable except for some mild scarring. No lung masses

## 2013-07-27 NOTE — Telephone Encounter (Signed)
Pt would like to have results of ct scan done on 07-23-13. Pt would like to know symptom of lyme disease. Pt was bitten by tick on 02-17-13

## 2013-07-29 ENCOUNTER — Ambulatory Visit: Payer: Medicare Other | Admitting: Physical Therapy

## 2013-07-29 ENCOUNTER — Encounter: Payer: Self-pay | Admitting: Vascular Surgery

## 2013-07-29 LAB — ARBOVIRUS PANEL, ~~LOC~~ LAB

## 2013-07-30 ENCOUNTER — Ambulatory Visit (HOSPITAL_COMMUNITY)
Admission: RE | Admit: 2013-07-30 | Discharge: 2013-07-30 | Disposition: A | Discharge status: Home / Self Care | Payer: Medicare Other | Source: Ambulatory Visit | Attending: Family | Admitting: Family

## 2013-07-30 ENCOUNTER — Encounter: Payer: Self-pay | Admitting: Family

## 2013-07-30 ENCOUNTER — Ambulatory Visit (INDEPENDENT_AMBULATORY_CARE_PROVIDER_SITE_OTHER): Payer: Medicare Other | Admitting: Family

## 2013-07-30 DIAGNOSIS — I6529 Occlusion and stenosis of unspecified carotid artery: Secondary | ICD-10-CM | POA: Insufficient documentation

## 2013-07-30 DIAGNOSIS — M79609 Pain in unspecified limb: Secondary | ICD-10-CM | POA: Insufficient documentation

## 2013-07-30 DIAGNOSIS — Z48812 Encounter for surgical aftercare following surgery on the circulatory system: Secondary | ICD-10-CM | POA: Insufficient documentation

## 2013-07-30 DIAGNOSIS — R29898 Other symptoms and signs involving the musculoskeletal system: Secondary | ICD-10-CM | POA: Insufficient documentation

## 2013-07-30 DIAGNOSIS — I714 Abdominal aortic aneurysm, without rupture: Secondary | ICD-10-CM

## 2013-07-30 NOTE — Patient Instructions (Addendum)
Stroke Prevention Some medical conditions and behaviors are associated with an increased chance of having a stroke. You may prevent a stroke by making healthy choices and managing medical conditions. Reduce your risk of having a stroke by:  Staying physically active. Get at least 30 minutes of activity on most or all days.  Not smoking. It may also be helpful to avoid exposure to secondhand smoke.  Limiting alcohol use. Moderate alcohol use is considered to be:  No more than 2 drinks per day for men.  No more than 1 drink per day for nonpregnant women.  Eating healthy foods.  Include 5 or more servings of fruits and vegetables a day.  Certain diets may be prescribed to address high blood pressure, high cholesterol, diabetes, or obesity.  Managing your cholesterol levels.  A low-saturated fat, low-trans fat, low-cholesterol, and high-fiber diet may control cholesterol levels.  Take any prescribed medicines to control cholesterol as directed by your caregiver.  Managing your diabetes.  A controlled-carbohydrate, controlled-sugar diet is recommended to manage diabetes.  Take any prescribed medicines to control diabetes as directed by your caregiver.  Controlling your high blood pressure (hypertension).  A low-salt (sodium), low-saturated fat, low-trans fat, and low-cholesterol diet is recommended to manage high blood pressure.  Take any prescribed medicines to control hypertension as directed by your caregiver.  Maintaining a healthy weight.  A reduced-calorie, low-sodium, low-saturated fat, low-trans fat, low-cholesterol diet is recommended to manage weight.  Stopping drug abuse.  Avoiding birth control pills.  Talk to your caregiver about the risks of taking birth control pills if you are over 35 years old, smoke, get migraines, or have ever had a blood clot.  Getting evaluated for sleep disorders (sleep apnea).  Talk to your caregiver about getting a sleep evaluation  if you snore a lot or have excessive sleepiness.  Taking medicines as directed by your caregiver.  For some people, aspirin or blood thinners (anticoagulants) are helpful in reducing the risk of forming abnormal blood clots that can lead to stroke. If you have the irregular heart rhythm of atrial fibrillation, you should be on a blood thinner unless there is a good reason you cannot take them.  Understand all your medicine instructions. SEEK IMMEDIATE MEDICAL CARE IF:   You have sudden weakness or numbness of the face, arm, or leg, especially on one side of the body.  You have sudden confusion.  You have trouble speaking (aphasia) or understanding.  You have sudden trouble seeing in one or both eyes.  You have sudden trouble walking.  You have dizziness.  You have a loss of balance or coordination.  You have a sudden, severe headache with no known cause.  You have new chest pain or an irregular heartbeat. Any of these symptoms may represent a serious problem that is an emergency. Do not wait to see if the symptoms will go away. Get medical help right away. Call your local emergency services (911 in [place] Do not drive yourself to the hospital. Document Released: 10/18/2004 Document Revised: 12/03/2011 Document Reviewed: 03/13/2013 Exit[employer] Patient Information 2014 [employer].   Abdominal Aortic Aneurysm An aneurysm is a weakened or damaged part of an artery wall that bulges from the normal force of blood pumping through the body. An abdominal aortic aneurysm is an aneurysm that occurs in the lower part of the aorta, the main artery of the body.  The major concern with an abdominal aortic aneurysm is that it can enlarge and burst (rupture) or   blood can flow between the layers of the wall of the aorta through a tear (aorticdissection). Both of these conditions can cause bleeding inside the body and can be life threatening unless diagnosed and treated promptly. CAUSES  The exact  cause of an abdominal aortic aneurysm is unknown. Some contributing factors are:   A hardening of the arteries caused by the buildup of fat and other substances in the lining of a blood vessel (arteriosclerosis).  Inflammation of the walls of an artery (arteritis).   Connective tissue diseases, such as Marfan syndrome.   Abdominal trauma.   An infection, such as syphilis or staphylococcus, in the wall of the aorta (infectious aortitis) caused by bacteria. RISK FACTORS  Risk factors that contribute to an abdominal aortic aneurysm may include:  Age older than 60 years.   High blood pressure (hypertension).  Male gender.  Ethnicity (white race).  Obesity.  Family history of aneurysm (first degree relatives only).  Tobacco use. PREVENTION  The following healthy lifestyle habits may help decrease your risk of abdominal aortic aneurysm:  Quitting smoking. Smoking can raise your blood pressure and cause arteriosclerosis.  Limiting or avoiding alcohol.  Keeping your blood pressure, blood sugar level, and cholesterol levels within normal limits.  Decreasing your salt intake. In somepeople, too much salt can raise blood pressure and increase your risk of abdominal aortic aneurysm.  Eating a diet low in saturated fats and cholesterol.  Increasing your fiber intake by including whole grains, vegetables, and fruits in your diet. Eating these foods may help lower blood pressure.  Maintaining a healthy weight.  Staying physically active and exercising regularly. SYMPTOMS  The symptoms of abdominal aortic aneurysm may vary depending on the size and rate of growth of the aneurysm.Most grow slowly and do not have any symptoms. When symptoms do occur, they may include:  Pain (abdomen, side, lower back, or groin). The pain may vary in intensity. A sudden onset of severe pain may indicate that the aneurysm has ruptured.  Feeling full after eating only small amounts of  food.  Nausea or vomiting or both.  Feeling a pulsating lump in the abdomen.  Feeling faint or passing out. DIAGNOSIS  Since most unruptured abdominal aortic aneurysms have no symptoms, they are often discovered during diagnostic exams for other conditions. An aneurysm may be found during the following procedures:  Ultrasonography (A one-time screening for abdominal aortic aneurysm by ultrasonography is also recommended for all men aged 65-75 years who have ever smoked).  X-ray exams.  A computed tomography (CT).  Magnetic resonance imaging (MRI).  Angiography or arteriography. TREATMENT  Treatment of an abdominal aortic aneurysm depends on the size of your aneurysm, your age, and risk factors for rupture. Medication to control blood pressure and pain may be used to manage aneurysms smaller than 6 cm. Regular monitoring for enlargement may be recommended by your caregiver if:  The aneurysm is 3 4 cm in size (an annual ultrasonography may be recommended).  The aneurysm is 4 4.5 cm in size (an ultrasonography every 6 months may be recommended).  The aneurysm is larger than 4.5 cm in size (your caregiver may ask that you be examined by a vascular surgeon). If your aneurysm is larger than 6 cm, surgical repair may be recommended. There are two main methods for repair of an aneurysm:   Endovascular repair (a minimally invasive surgery). This is done most often.  Open repair. This method is used if an endovascular repair is not possible. Document   Released: 06/20/2005 Document Revised: 01/05/2013 Document Reviewed: 10/10/2012 [employer] Patient Information 2014 [employer].  

## 2013-07-30 NOTE — Progress Notes (Signed)
Established Carotid Patient  History of Present Illness  Brett Castaneda is a 72 y.o. male patient of Dr. Darrick Penna status post right CEA in 2008. Legs are weak, he staggers, no strength in his legs, his body has a forward momentum that he has trouble stopping. ABI's done Sept., 2014 demonstrate no evidence of arterial occlusive disease. 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 He had TIA's prior to the CEA, then had left arm and leg weakness after the CEA which resolved before he was discharged from the hospital. 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 Patient denies expressive or receptive aphasia. Wife states he had a TIA while taking Plavix and it was stopped. Patient states he has no heart disease, but has a hx of small aneurysm "behind the heart" and he states this has improved, states coumadin was stopped since this improved. July, 2014 he had an abdominal aortic US which showed abdominal aortic aneurysm measuring up to 3.4 cm.  Patient denies New Medical or Surgical History.  Pt Diabetic: No Pt smoker: former smoker, quit 1982  Pt meds include: Statin : Yes, wife states his cholesterol is good ASA: Yes Other anticoagulants/antiplatelets: no   Past Medical History  Diagnosis Date  . BASAL CELL CARCINOMA, FACE 03/31/2009  . 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 CONSTIPATION 06/22/2008  . DIVERTICULOSIS, COLON 03/24/2007  . GERD 03/24/2007  . HYPERLIPIDEMIA 08/27/2007  . HYPERTENSION 12/14/2008  . Rheumatoid arthritis(714.0) 03/24/2007  . Blind right eye   . Retinal detachment     hx of  . Keratosis Oct. 2013  . Fall at home Nov. 5, 2014  06-28-13    in the home and Outside as well  . 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 Stroke Oct 2014    Social History History  Substance Use Topics  . Smoking status: Former Smoker -- 23  years    Types: Cigarettes    Quit date: 09/24/1980  . Smokeless tobacco: Never Used  . Alcohol Use: No    Family History Family History  Problem Relation Age of Onset  . Stroke Mother 68  . Heart disease Father   . Coronary artery disease Sister   . Heart disease Sister   . Diabetes Sister   . Hypertension Sister     Surgical History Past Surgical History  Procedure Laterality Date  . Total knee arthroplasty      bital  . Eye surgery      retinal detachment  . Carpal tunnel release    . Cataract extraction  1980    removed cataract in left eye. Currently blind in right eye  . Cholecystectomy  2001    Gall Bladder  . Retinal detachment surgery  2004  . Lumbar puncture  Oct. 21, 2014  . Nerve conduction    . Mri  07-14-13  . Carotid endarterectomy Right 08-15-07    cea    Allergies  Allergen Reactions  . Dopamine Anaphylaxis  . Morphine And Related Other (See Comments)    Hallucinations, very bad reactions  . Vicodin [Hydrocodone-Acetaminophen] Nausea And Vomiting    Current Outpatient Prescriptions  Medication Sig Dispense Refill  . aspirin 325 MG tablet Take 325 mg by mouth daily.       . chlorpheniramine (CHLOR-TRIMETON) 4 MG tablet Take 4 mg  by mouth daily as needed for allergies.      . famotidine (PEPCID) 20 MG tablet Take 20 mg by mouth daily.      Marland Kitchen latanoprost (XALATAN) 0.005 % ophthalmic solution Place 1 drop into the left eye at bedtime.       . methotrexate (RHEUMATREX) 2.5 MG tablet Take 10 mg by mouth 2 (two) times a week. Saturday and Sunday; Caution:Chemotherapy. Protect from light.      . Multiple Vitamins-Minerals (ICAPS) CAPS Take 1 capsule by mouth 2 (two) times daily.      . polyethylene glycol (MIRALAX / GLYCOLAX) packet Take 17 g by mouth daily as needed (for constipation).      . timolol (TIMOPTIC) 0.5 % ophthalmic solution Place 1 drop into the left eye daily.       Marland Kitchen VIRT-VITE FORTE 2.5-25-2 MG TABS take 1 tablet by mouth once daily  90  tablet  0  . Thiamine HCl (VITAMIN B-1) 250 MG tablet Take 250 mg by mouth daily.       No current facility-administered medications for this visit.    Review of Systems : [x]  Positive   [ ]  Denies  General:[ ]  Weight loss,  [ ]  Weight gain, [ ]  Loss of appetite, [ ]  Fever, [ ]  chills  Neurologic: [ ]  Dizziness, [ ]  Blackouts, [ ]  Headaches, [ ]  Seizure [ ]  Stroke, [ ]  "Mini stroke", [ ]  Slurred speech, [ ]  Temporary blindness;  [ ] weakness,  Ear/Nose/Throat: [ ]  Change in hearing, [ ]  Nose bleeds, [ ]  Hoarseness  Vascular:[ ]  Pain in legs with walking, [ ]  Pain in feet while lying flat , [ ]   Non-healing ulcer, [ ]  Blood clot in vein,    Pulmonary: [ ]  Home oxygen, [ ]   Productive cough, [ ]  Bronchitis, [ ]  Coughing up blood,  [ ]  Asthma, [ ]  Wheezing  Musculoskeletal:  [ ]  Arthritis, [ ]  Joint pain, [ ]  low back pain  Cardiac: [ ]  Chest pain, [ ]  Shortness of breath when lying flat, [ ]  Shortness of breath with exertion, [ ]  Palpitations, [ ]  Heart murmur, [ ]   Atrial fibrillation  Hematologic:[ ]  Easy Bruising, [ ]  Anemia; [ ]  Hepatitis  Psychiatric: [ ]   Depression, [ ]  Anxiety   Gastrointestinal: [ ]  Black stool, [ ]  Blood in stool, [ ]  Peptic ulcer disease,  [ ]  Gastroesophageal Reflux, [ ]  Trouble swallowing, [ ]  Diarrhea, [ ]  Constipation  Urinary: [ ]  chronic Kidney disease, [ ]  on HD, [ ]  Burning with urination, [ ]  Frequent urination, [ ]  Difficulty urinating;   Skin: [ ]  Rashes, [ ]  Wounds    Physical Examination  Filed Vitals:   07/30/13 1126  BP: 134/95  Pulse: 77  Resp:    Filed Weights   07/30/13 1120  Weight: 175 lb (79.379 kg)   Body mass index is 24.42 kg/(m^2).   General: WDWN male in NAD, tired appearing, must lie supine on stretcher since he is fatigued. GAIT: slow and deliberate, using walker. Eyes: PERRLA Pulmonary:  CTAB, Negative  Rales, Negative rhonchi, & Negative wheezing.  Cardiac: regular Rhythm ,  Negative Murmurs.  VASCULAR  EXAM Carotid Bruits Left Right   Negative Negative    Aorta is is palpable. Radial pulses are 2+ palpable and equal.  LE Pulses LEFT RIGHT       FEMORAL   palpable   palpable        POPLITEAL  not palpable   not palpable    Gastrointestinal: soft, nontender, BS WNL, no r/g,  negative masses.  Musculoskeletal: mild generalized muscle atrophy/wasting. M/S 2/5 throughout, Extremities without ischemic changes.  Neurologic: A&O X 3; Appropriate Affect ; SENSATION ;normal;  Speech is normal CN 2-12 intact , Pain and light touch intact in extremities, Motor exam as listed above.   Non-Invasive Vascular Imaging CAROTID DUPLEX 07/30/2013   Right ICA: patent, CEA site, no restenosis. Left ICA: <40% stenosis. Right vertebral artery is antegrade and blunted suggestive of a more proximal stenosis vs.occlusion. Left vertebral artery is patent and antegrade.  These findings are Unchanged from previous exam.  Assessment: Brett Castaneda is a 72 y.o. male who presents with asymptomatic patent right ICA with no restenosis and <40% Left ICA stenosis. The  ICA stenosis is  Unchanged from previous exam. Patient denies any steal symptoms in his arms/hands. Discussed the above results with Dr. Darrick Penna; the internal carotid arteries are not contributing to any of his weakness nor lack of balance issues. Will defer to Dr. Nita Sickle as to the significance of the right vertebral artery as antegrade and blunted suggestive of a more proximal stenosis vs.occlusion.  Plan: Follow-up in 1 year  with Carotid Duplex scan and  AAA Duplex to follow up on 3.4 cm AAA noted on April 06, 2013 abdominal US.   I discussed in depth with the patient the nature of atherosclerosis, and emphasized the importance of maximal medical management including strict control of blood pressure, blood glucose, and  lipid levels, obtaining regular exercise, and continued cessation of smoking.  The patient is aware that without maximal medical management the underlying atherosclerotic disease process will progress, limiting the benefit of any interventions. The patient was given information about stroke prevention and abdominal aortic aneurysm and what symptoms should prompt the patient to seek immediate medical care. Thank you for allowing [place] to participate in this patient's care.  Charisse March, RN, MSN, FNP-C [facility] Office: [phone]  Clinic Physician: Darrick Penna  07/30/2013 11:46 AM

## 2013-07-31 ENCOUNTER — Encounter: Payer: Self-pay | Admitting: Neurology

## 2013-07-31 ENCOUNTER — Ambulatory Visit (INDEPENDENT_AMBULATORY_CARE_PROVIDER_SITE_OTHER): Payer: Medicare Other | Admitting: Neurology

## 2013-07-31 VITALS — BP 140/82 | HR 86 | Temp 98.3°F | Wt 176.0 lb

## 2013-07-31 DIAGNOSIS — Z9181 History of falling: Secondary | ICD-10-CM

## 2013-07-31 DIAGNOSIS — R269 Unspecified abnormalities of gait and mobility: Secondary | ICD-10-CM

## 2013-07-31 DIAGNOSIS — R296 Repeated falls: Secondary | ICD-10-CM

## 2013-07-31 DIAGNOSIS — G609 Hereditary and idiopathic neuropathy, unspecified: Secondary | ICD-10-CM

## 2013-07-31 DIAGNOSIS — G629 Polyneuropathy, unspecified: Secondary | ICD-10-CM

## 2013-07-31 LAB — B. BURGDORFI ANTIBODIES, CSF

## 2013-07-31 LAB — C-REACTIVE PROTEIN: CRP: 1.1 mg/dL (ref 0.5–20.0)

## 2013-07-31 LAB — SEDIMENTATION RATE: Sed Rate: 43 mm/hr — ABNORMAL HIGH (ref 0–22)

## 2013-07-31 LAB — RHEUMATOID FACTOR: Rhuematoid fact SerPl-aCnc: 13 IU/mL (ref <=14)

## 2013-07-31 NOTE — Progress Notes (Signed)
[facility]  Follow-up Visit   Date: 07/31/2013    Brett Castaneda MRN: [id number] DOB: 1941/06/27   Interim History: Brett Castaneda is a 72 y.o. right-handed Caucasian male with history of HPL, HTN, RA (on MTX, intermittently on steroids), GERD, right retinal detachment, and stroke (2008, mild residual left sided weakness), and right carotid stenosis s/p CEA (2008) returning to the clinic for follow-up of falls.  The patient was accompanied to the clinic by wife.  Since his last visit, patient has had no change in symptoms, if not slight worsening of weakness.  He has one fall in the living room last week.  He has been going to therapy, but has not noticed any improvement in his balance.    Of note, he tells me that in in May 2014, he had a tick bite on his leg and a the following month, his weakness started.  Lyme titers from CSF are still pending.    He has had extensive work-up from his initial visit including MRI neuroaxis, CSF and laboratory testing (see below).  MRI brain shows subacute possible right pontine stroke  No evidence of CSF infection/inflammatory.  Labs indicate mild elevation in ESR and vitamin B6.     He endorses weakness of his legs, generalized malaise, and cramping of the legs. No bowel/bladder problems. Denies any facial weakness, problems with swallowing/talking, muscle twitches, numbness/tingling. Functionally, he has become dependent on his wife to help with nearly all ADLs.  He does not leave the home any more because he is unable to walk very far.   History of present illness: Brett Castaneda has always been physically active and even last year was cutting firewood. 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 However, during the summer 2014, he started developing bilateral leg weakness and walking problems. His wife recall that he became acutely ill on 04/08/2013 because she was going with her husband to get  her driver's license, he started feeling nauseated and sick. When she drove them home, he was took weak to get out of the car so she drove him to the Emergency Department. He was complaining of chest pain, shortness of breath, and back pain. Work-up was suggestive of community-acquired pneumonia with mild leukocytosis, EKG and cardiac enzymes were unremarkable and he was started on appropriate antibiotics and discharged home.   Since his discharge, he has not been doing well and especially started noticing problems with walking and feeling very unsteady. He has fallen 5 times over the past few months, some of which were severe falls resulting in facial contusions and breaking his eye glasses. His last two falls occurred when he was on the side-walk. He feels that he is unable to stop and when he does attempt to walk, sometimes shuffles backwards.   Medications:  Current Outpatient Prescriptions on File Prior to Visit  Medication Sig Dispense Refill  . aspirin 325 MG tablet Take 325 mg by mouth daily.       . chlorpheniramine (CHLOR-TRIMETON) 4 MG tablet Take 4 mg by mouth daily as needed for allergies.      . famotidine (PEPCID) 20 MG tablet Take 20 mg by mouth daily.      Marland Kitchen latanoprost (XALATAN) 0.005 % ophthalmic solution Place 1 drop into the left eye at bedtime.       . methotrexate (RHEUMATREX) 2.5 MG tablet Take 10 mg by mouth 2 (two) times a week. Saturday and Sunday; Caution:Chemotherapy. Protect from  light.      . Multiple Vitamins-Minerals (ICAPS) CAPS Take 1 capsule by mouth 2 (two) times daily.      . polyethylene glycol (MIRALAX / GLYCOLAX) packet Take 17 g by mouth daily as needed (for constipation).      . Thiamine HCl (VITAMIN B-1) 250 MG tablet Take 250 mg by mouth daily.      . timolol (TIMOPTIC) 0.5 % ophthalmic solution Place 1 drop into the left eye daily.       Marland Kitchen VIRT-VITE FORTE 2.5-25-2 MG TABS take 1 tablet by mouth once daily  90 tablet  0   No current facility-administered  medications on file prior to visit.    Allergies:  Allergies  Allergen Reactions  . Dopamine Anaphylaxis  . Morphine And Related Other (See Comments)    Hallucinations, very bad reactions  . Vicodin [Hydrocodone-Acetaminophen] Nausea And Vomiting     Review of Systems:  CONSTITUTIONAL: No fevers, chills, night sweats, +weight loss.   EYES: No visual changes or eye pain ENT: No hearing changes.  No history of nose bleeds.   RESPIRATORY: No cough, wheezing and shortness of breath.   CARDIOVASCULAR: Negative for chest pain, and palpitations.   GI: Negative for abdominal discomfort, blood in stools or black stools.  No recent change in bowel habits.   GU:  No history of incontinence.   MUSCLOSKELETAL: + joint pain or swelling.  No myalgias.   SKIN: Negative for lesions, rash, and itching.   ENDOCRINE: Negative for cold or heat intolerance, polydipsia or goiter.   PSYCH:  No depression or anxiety symptoms.   NEURO: As Above.   Vital Signs:  BP 138/82  Pulse 80  Temp(Src) 98.3 F (36.8 C)  Wt 176 lb (79.833 kg)  Neurological Exam: MENTAL STATUS including orientation to time, place, person, recent and remote memory, attention span and concentration, language, and fund of knowledge is normal.  Speech is not dysarthric.  CRANIAL NERVES: II:  No visual field defects on the left eye. 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  III-IV-VI: Left pupil is briskly reactive. Mildly disconjugate gaze with baseline right exotropia. Extraocular muscles of the left eye are intact.  V:  Normal facial sensation.  Jaw jerk is absent.   VII:  Subtle Mild facial weakness of the frontalis, buccinator, orbicularis oculi, and orbicularis oris muscle (5-/5). Face is symmetric. Snout and Myerson's signs present.  IX-X:  Normal palatal movement.   XI:  Normal shoulder shrug and head rotation.   XII:  Normal tongue strength and range of motion, no deviation or fasciculation.  MOTOR:  Marked atrophy of the instrinsic  hand muscles (ABP, ADM, FDI) and TA bilaterally.  No fasciculations or abnormal movements.  No pronator drift.  Tone is normal.    Right Upper Extremity:    Left Upper Extremity:    Deltoid  5/5   Deltoid  5/5   Biceps  5/5   Biceps  5/5   Triceps  5/5   Triceps  5/5   Wrist extensors  5/5   Wrist extensors  5/5   Wrist flexors  4+/5   Wrist flexors  4+/5   Finger extensors  5/5   Finger extensors  5/5   Finger flexors  5/5   Finger flexors  5/5   Dorsal interossei  4-/5   Dorsal interossei  4-/5   Abductor pollicis  4-/5   Abductor pollicis  4-/5   Tone (Ashworth scale)  0  Tone (Ashworth scale)  0   Right Lower Extremity:    Left Lower Extremity:    Hip flexors  4+/5   Hip flexors  4+/5   Hip extensors  5/5   Hip extensors  5/5   Knee flexors  5/5   Knee flexors  5/5   Knee extensors  5/5   Knee extensors  5/5   Dorsiflexors  2/5   Dorsiflexors  2/5   Plantarflexors  2/5   Plantarflexors  2/5   Toe extensors  0/5   Toe extensors  1/5   Toe flexors  1/5   Toe flexors  1/5   Tone (Ashworth scale)  0  Tone (Ashworth scale)  0   MSRs:  Right                                                                 Left brachioradialis 3+  brachioradialis 3+  biceps 3+  biceps 3+  triceps 3+  triceps 3+  patellar 2+  patellar 2+  ankle jerk 0  ankle jerk 0  Hoffman no  Hoffman no  plantar response down  plantar response up   SENSORY:  Vibration is reduced to 20% at knees bilaterally and absent at ankles. Proprioception impaired at great toe bilaterally. Light touch, pin prick, and temperature is intact. Romberg's sign prominently positive. No pseudoathetosis.  COORDINATION/GAIT: Normal finger-to- nose-finger.  Intact rapid alternating movements bilaterally.  He is able to stand by pushing off the chair, unable to stand without support.  Gait was not testing due to severity of instability.  Data: MRI brain wwo 07/07/2013: ?tiny acute infarct right pons. Old right frontal infarct with  encephalomalcia   MRI cervical spine wwo 07/07/2013: spondylotic changes C4-C5 and C5-C6 on the right with very mild right-sided cord flattening. Thyroid lesions can be assessed/ polyp with thyroid ultrasound.   MRI lumbar spine 05/31/2013: Spinal stenosis and degenerative changes most prominent L4-5 level   CT/A head and neck 07/16/2013: LICA siphon with calcified plaque, no stenosis. Mild cervical atherosclerosis. Right vertebral is patent, but very small in caliber. Extensive aortic arch and R subclavian artery plaque with ulcerated plaque in the arch. 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 There is note of bilateral upper lung nodules, up to 17 mm (no prior comparison study).   CSF 07/14/2013: R0 W0 G51 P31, IgG 2.4 CSF ACE, HSV, cytology, VDRL, WNV, MBP, OCB - neg  CSF Lyme - pending   Serum: Vitamin B12, vit B1, vit E, MMA, copper, ceruloplasmin, CK, HbA1c, SPEP/UPEP with IFE, zinc - all normal limits  *Abnormal: Vitamin B6 elevated 51.7, ESR 52   EMG 07/01/2013: In summary, this is a complex NCS/EMG study with findings as follows:  1. 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 Overall, these changes are moderately severe in degree electrically.  2. Alternatively, multilevel intraspinal canal lesions (i.e. radiculopathy, anterior horn cell disorder) cannot be excluded and repeat EMG in 3-6 months may be indicated to evaluate to degree of ongoing axon motor loss.  3. Right ulnar neuropathy at the elbow, moderate in degree electrically.  IMPRESSION: 1. Bilateral leg and hand weakness  - Clinically, patient appears to be worsening  - Exam slightly worse with  diminished motor strength distally   - No evidence of myopathy based on EMG and normal CK  - No evidence of GBS based on CSF testing  - EMG most consistent with a generalized sensorimotor PN, however multilevel intraspinal canal  lesions (i.e. anterior horn cell disorder) is also possible with a superimposed neuropathy  - Prominent atrophy with preserved reflexes makes UMN process still a possibility with a superimposed PN and a repeat EMG may be warranted to look for ongoing motor axon loss 2. Generalized sensorimotor polyneuropathy, length-dependent with active motor axon loss  - Work-up thus far has been extensive, but notable only for elevated vitamin B6.  MRI neuroaxis, CSF, and labs normal  - At this time, differential remains subacute peripheral neuropathy (unclear etiology- ?elevated B6, inflammatory process)   - Elevated vitamin B6 can cause sensory neuronopathy like he has, so patient stopped B6 supplementation. Recheck levels 3. Possible R pontine stroke, likely small vessel vs embolic from aortic arch calcification.   - Ulcerated aortic arch plaque  - He is already on aspirin, may consider plavix because of ulcerated plaque.   - I am not convinced that a pontine stroke is primarily responsible for his progressive gait unsteadiness because his EMG findings suggested more of an acute peripheral nerve process   - Given that the left vertebral is dominant and filling the basilar, no intervention recommended for hypoplastic R vertebral artery besides medical management 4. Rheumatoid arthritis  - on MTX 10mg  per week, previously on Humira (stopped in 2012)   PLAN/RECOMMENDATIONS:  1.  Lyme titer, vitamin B6, CRP, ESR, RF, heavy metals, paraneoplastic panel (complete) 2.  Extensive discussion regarding referral to academic center for second opinion, pursuing a nerve biopsy, or empiric trial of steroids.  Patient would like to continue their care here and is not seeking another opinion at this time.  Sural nerve biopsy can be done, but I discussed that there is a risk of having numbness/tingling over the side of the foot, which can be permanent in some cases.  Finally, the option of empiric trial of high-dose steroids  can be considered.  They are favoring a course of steroids, but I would like to discuss his case with patient's PCP and rheumatologist (Dr. Kellie Simmering) as well. 3.  Continue physical therapy.  Fall precautions discussed including using a 4-wheeled rollator, but patient is happy with a walker for now 4.  Home health aide declined 5.  Continue ASA for now, consider adding plavix and statin for secondary risk factor prevention.  Understandably, after discussing all the results of the testing thus far, patient is too overwhelmed with all the details of today's visit so opted to discuss this further at the next visit 6.  Return to clinic in 2 weeks   The duration of this appointment visit was 60 minutes of face-to-face time with the patient.  Greater than 50% of this time was spent in counseling, explanation of diagnosis, planning of further management, and coordination of care.   Thank you for allowing me to participate in patient's care.  If I can answer any additional questions, I would be pleased to do so.    Sincerely,    Sharie Amorin K. Allena Katz, DO

## 2013-08-02 LAB — HEAVY METALS, BLOOD
Arsenic: <3 mcg/L (ref <23)
Mercury, B: <4 mcg/L (ref <=10)

## 2013-08-03 ENCOUNTER — Ambulatory Visit: Payer: Medicare Other | Admitting: Physical Therapy

## 2013-08-03 ENCOUNTER — Telehealth: Payer: Self-pay | Admitting: Neurology

## 2013-08-03 LAB — B. BURGDORFI ANTIBODIES: B burgdorferi Ab IgG+IgM: 0.39 {ISR}

## 2013-08-03 MED ORDER — CLOPIDOGREL BISULFATE 75 MG PO TABS: 75.0000 mg | ORAL_TABLET | Freq: Every day | ORAL | Status: DC

## 2013-08-03 NOTE — Telephone Encounter (Signed)
Received call from patient stating that they are ok with switching to plavix.  He was instructed to stop aspirin.  Rx sent to pharmacy.  I have contacted Dr. Ines Bloomer office, patient's rheumatologist, to discuss patient's care and I am awaiting call back.  Chaeli Judy K. Allena Katz, DO

## 2013-08-04 ENCOUNTER — Telehealth: Payer: Self-pay | Admitting: Neurology

## 2013-08-04 MED ORDER — PREDNISONE 10 MG PO TABS: ORAL_TABLET | ORAL | Status: DC

## 2013-08-04 NOTE — Telephone Encounter (Signed)
Discussed patient's work-up thus far with Dr. Kellie Simmering, patient's rheumatologist.  Infection has been excluded so I feel that we can offer a trial of steroids.  Will start prednisone 60mg  x 1 week, then 40mg  x 1 week, and continue based on how he is doing.  He is scheduled to see Dr. Kellie Simmering on 11/18 and I will plan on seeing him back the following week.  Discussed with patient who is in agreement of plan.  Risks and benefits discussed.  Shelvy Perazzo K. Allena Katz, DO

## 2013-08-04 NOTE — Telephone Encounter (Signed)
Call to talk to patient about starting steroids, but there was no answer.  I will try the patient again later.  Haylynn Pha K. Allena Katz, DO

## 2013-08-05 ENCOUNTER — Ambulatory Visit: Payer: Medicare Other | Admitting: Physical Therapy

## 2013-08-05 NOTE — Telephone Encounter (Signed)
LM for pt to call / Sherri

## 2013-08-06 NOTE — Telephone Encounter (Signed)
Pt is scheduled to be seen 08/17/13  Brett Castaneda

## 2013-08-07 ENCOUNTER — Telehealth: Payer: Self-pay | Admitting: Neurology

## 2013-08-10 ENCOUNTER — Ambulatory Visit: Payer: Self-pay | Admitting: Physical Therapy

## 2013-08-11 ENCOUNTER — Ambulatory Visit: Payer: Self-pay | Admitting: Physical Therapy

## 2013-08-11 LAB — PROTEIN ELECTROPHORESIS
Alpha 2: 0.5 g/dL (ref 0.4–1.2)
Beta: 1 g/dL (ref 0.6–1.3)
Globulin, Total: 3.6 g/dL (ref 2.0–4.5)

## 2013-08-11 LAB — GM1 ANTIBODY IGG, IGM: GM 1 IgG: <1:100 {titer}

## 2013-08-11 NOTE — Telephone Encounter (Signed)
Spoke with Brett Castaneda informed him that someone from [facility] would be coming out to draw his labs he should keep the box that was mailed to him

## 2013-08-13 ENCOUNTER — Ambulatory Visit: Payer: Self-pay | Admitting: Physical Therapy

## 2013-08-17 ENCOUNTER — Ambulatory Visit (INDEPENDENT_AMBULATORY_CARE_PROVIDER_SITE_OTHER): Payer: Medicare Other | Admitting: Neurology

## 2013-08-17 ENCOUNTER — Encounter: Payer: Self-pay | Admitting: Neurology

## 2013-08-17 VITALS — BP 166/88 | HR 78 | Temp 98.0°F | Resp 16 | Ht 71.0 in | Wt 179.5 lb

## 2013-08-17 DIAGNOSIS — E785 Hyperlipidemia, unspecified: Secondary | ICD-10-CM

## 2013-08-17 DIAGNOSIS — I10 Essential (primary) hypertension: Secondary | ICD-10-CM

## 2013-08-17 DIAGNOSIS — G629 Polyneuropathy, unspecified: Secondary | ICD-10-CM

## 2013-08-17 DIAGNOSIS — G609 Hereditary and idiopathic neuropathy, unspecified: Secondary | ICD-10-CM

## 2013-08-17 LAB — LIPID PANEL
Cholesterol: 183 mg/dL (ref 0–200)
HDL: 61.6 mg/dL (ref >39.00)
LDL Cholesterol: 105 mg/dL — ABNORMAL HIGH (ref 0–99)
Total CHOL/HDL Ratio: 3
Triglycerides: 80 mg/dL (ref 0.0–149.0)
VLDL: 16 mg/dL (ref 0.0–40.0)

## 2013-08-17 NOTE — Patient Instructions (Addendum)
1.  Taper prednisone as follows:  Day 1-5: 30mg  daily  Day 6-10:  20mg  daily  Day 11-15:  15mg  daily  Day 16-20: 10mg   Day 21-25:  5mg  daily  Day 26-35:  5mg  every other day 2.  EMG of the right side in January 3.  Check lipid panel today 4.  Return to clinic in January

## 2013-08-17 NOTE — Progress Notes (Signed)
[facility] Neurology Division  Follow-up Visit   Date: 08/17/2013    Brett Castaneda MRN: [id number] DOB: 12/14/1940   Interim History: Brett Castaneda is a 72 y.o. right-handed Caucasian male with history of HPL, HTN, RA (on MTX, intermittently on steroids), GERD, right retinal detachment, and stroke (2008, mild residual left sided weakness), and right carotid stenosis s/p CEA (2008) returning to the clinic for follow-up of falls and generalized weakness.  He was last seen in the clinic on 07/31/2013.  The patient was accompanied to the clinic by wife.  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  He is currently on prednisone 40mg  and reports no change in symptoms.  In fact, he and his wife say that he has become much more irritable.  There have been no interval falls or hospitalizations.  He has noticed new twitching of his muscles over his calf and hands.  Denies any problems with swallowing, talking, or any shortness of breath.  He continues to go to PT, but has not reported any benefit.   History of present illness: Brett Castaneda has always been physically active and even last year was cutting firewood. 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 However, during the summer 2014, he started developing bilateral leg weakness and walking problems. His wife recall that he became acutely ill on 04/08/2013 because she was going with her husband to get her driver's license, he started feeling nauseated and sick. When she drove them home, he was took weak to get out of the car so she drove him to the Emergency Department. He was complaining of chest pain, shortness of breath, and back pain. Work-up was suggestive of community-acquired pneumonia with mild leukocytosis, EKG and cardiac enzymes were unremarkable and he was started on appropriate antibiotics and  discharged home.   Since his discharge, he has not been doing well and especially started noticing problems with walking and feeling very unsteady. He has fallen 5 times over the past few months, some of which were severe falls resulting in facial contusions and breaking his eye glasses. His last two falls occurred when he was on the side-walk. He feels that he is unable to stop and when he does attempt to walk, sometimes shuffles backwards.   May 2014, he had a tick bite on his leg and a the following month, his weakness started.  Lyme titers are negative.  11/7 follow-up:   He has had extensive work-up from his initial visit including MRI neuroaxis, CSF and laboratory testing (see below).  MRI brain shows subacute possible right pontine stroke.  No evidence of CSF infection/inflammatory.  Serum labs indicate mild elevation in ESR and vitamin B6.  Recommended patient to stop B complex supplements.   Progressive weakness and dependent on his wife for nearly all ADLs.  Trial of prednisone 60mg  started on 11/11.   Medications:  Current Outpatient Prescriptions on File Prior to Visit  Medication Sig Dispense Refill  . chlorpheniramine (CHLOR-TRIMETON) 4 MG tablet Take 4 mg by mouth daily as needed for allergies.      Marland Kitchen clopidogrel (PLAVIX) 75 MG tablet Take 1 tablet (75 mg total) by mouth daily.  30 tablet  11  . famotidine (PEPCID) 20 MG tablet Take 20 mg by mouth daily.      Marland Kitchen latanoprost (XALATAN) 0.005 % ophthalmic solution Place 1 drop into the left eye  at bedtime.       . Multiple Vitamins-Minerals (ICAPS) CAPS Take 1 capsule by mouth 2 (two) times daily.      . polyethylene glycol (MIRALAX / GLYCOLAX) packet Take 17 g by mouth daily as needed (for constipation).      . predniSONE (DELTASONE) 10 MG tablet Take 6 pills for one week, then take 4 pills for one week, then 3 pills thereafter.  150 tablet  1  . Thiamine HCl (VITAMIN B-1) 250 MG tablet Take 250 mg by mouth daily.      . timolol  (TIMOPTIC) 0.5 % ophthalmic solution Place 1 drop into the left eye daily.       Marland Kitchen VIRT-VITE FORTE 2.5-25-2 MG TABS take 1 tablet by mouth once daily  90 tablet  0  . methotrexate (RHEUMATREX) 2.5 MG tablet Take 10 mg by mouth 2 (two) times a week. Saturday and Sunday; Caution:Chemotherapy. Protect from light.       No current facility-administered medications on file prior to visit.    Allergies:  Allergies  Allergen Reactions  . Dopamine Anaphylaxis  . Morphine And Related Other (See Comments)    Hallucinations, very bad reactions  . Vicodin [Hydrocodone-Acetaminophen] Nausea And Vomiting     Review of Systems:  CONSTITUTIONAL: No fevers, chills, night sweats, +weight loss.   EYES: No visual changes or eye pain ENT: No hearing changes.  No history of nose bleeds.   RESPIRATORY: No cough, wheezing and shortness of breath.   CARDIOVASCULAR: Negative for chest pain, and palpitations.   GI: Negative for abdominal discomfort, blood in stools or black stools.  No recent change in bowel habits.   GU:  No history of incontinence.   MUSCLOSKELETAL: + joint pain or swelling.  No myalgias.   SKIN: Negative for lesions, rash, and itching.   ENDOCRINE: Negative for cold or heat intolerance, polydipsia or goiter.   PSYCH:  No depression or anxiety symptoms.   NEURO: As Above.   Vital Signs:  BP 166/88  Pulse 78  Temp(Src) 98 F (36.7 C)  Resp 16  Ht 5\' 11"  (1.803 m)  Wt 179 lb 8 oz (81.421 kg)  BMI 25.05 kg/m2  Neurological Exam: MENTAL STATUS including orientation to time, place, person, recent and remote memory, attention span and concentration, language, and fund of knowledge is normal.  Speech is not dysarthric.  CRANIAL NERVES: II:  No visual field defects on the left eye. 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  III-IV-VI: Left pupil is briskly reactive. Mildly disconjugate gaze with baseline right exotropia. Extraocular muscles of the left eye are intact.  V:  Normal facial sensation.   Jaw jerk is absent.   VII:  Subtle facial weakness of the frontalis, buccinator, orbicularis oculi, and orbicularis oris muscle (5-/5). Face is symmetric. Palomental reflex, snout and Myerson's signs present.  IX-X:  Normal palatal movement.   XI:  Normal shoulder shrug and head rotation.   XII:  Normal tongue strength and range of motion, no deviation or fasciculation.  MOTOR:  Marked atrophy of the instrinsic hand muscles (ABP, ADM, FDI) and TA bilaterally. Rare fasciculations over the left FDI. No pronator drift.  Tone is normal.    Right Upper Extremity:    Left Upper Extremity:    Deltoid  5/5   Deltoid  5/5   Biceps  5/5   Biceps  5/5   Triceps  5/5   Triceps  5/5   Wrist extensors  5/5   Wrist extensors  5/5   Wrist flexors  4+/5   Wrist flexors  4+/5   Finger extensors  5/5   Finger extensors  5/5   Finger flexors  5/5   Finger flexors  5/5   Dorsal interossei  4-/5   Dorsal interossei  4-/5   Abductor pollicis  4-/5   Abductor pollicis  4-/5   Tone (Ashworth scale)  0  Tone (Ashworth scale)  0   Right Lower Extremity:    Left Lower Extremity:    Hip flexors  5-/5   Hip flexors  5-/5   Hip extensors  5/5   Hip extensors  5/5   Knee flexors  5/5   Knee flexors  5/5   Knee extensors  5/5   Knee extensors  5/5   Dorsiflexors  2/5   Dorsiflexors  2/5   Plantarflexors  2/5   Plantarflexors  2/5   Toe extensors  0/5   Toe extensors  1/5   Toe flexors  1/5   Toe flexors  1/5   Tone (Ashworth scale)  0  Tone (Ashworth scale)  0   MSRs:  Right                                                                 Left brachioradialis 3+  brachioradialis 3+  biceps 3+  biceps 3+  triceps 3+  triceps 3+  Patellar* 4+  patellar 4+  ankle jerk 0  ankle jerk 0  Hoffman no  Hoffman no  plantar response down  plantar response up  Vertical spread with patella testing to the shoulders  SENSORY:  Vibration is absent at the the ankles only and intact at the kness (improved). Proprioception  impaired at great toe bilaterally. Light touch, pin prick, and temperature is intact. Romberg's sign prominently positive.   COORDINATION/GAIT: Normal finger-to- nose-finger.  Intact rapid alternating movements bilaterally.  He is able to stand by pushing off the chair, unable to stand without support.  Gait was not testing due to severity of instability.  Data: MRI brain wwo 07/07/2013: ?tiny acute infarct right pons. Old right frontal infarct with encephalomalcia   MRI cervical spine wwo 07/07/2013: spondylotic changes C4-C5 and C5-C6 on the right with very mild right-sided cord flattening. Thyroid lesions can be assessed/ polyp with thyroid ultrasound.   MRI lumbar spine 05/31/2013: Spinal stenosis and degenerative changes most prominent L4-5 level   CT/A head and neck 07/16/2013: LICA siphon with calcified plaque, no stenosis. Mild cervical atherosclerosis. Right vertebral is patent, but very small in caliber. Extensive aortic arch and R subclavian artery plaque with ulcerated plaque in the arch. 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 There is note of bilateral upper lung nodules, up to 17 mm (no prior comparison study).   CSF 07/14/2013: R0 W0 G51 P31, IgG 2.4 CSF ACE, HSV, cytology, VDRL, WNV, MBP, OCB - neg  CSF Lyme, WNV, echovirus - neg  Serum: Vitamin B12, vit B1, vit E, MMA, copper, ceruloplasmin, CK, CRP, RF, Lyme titers, heavy metal screen,  HbA1c, SPEP/UPEP with IFE, zinc - all normal limits  *Abnormal:    Component     Latest Ref Rng 03/04/2013 05/22/2013 07/07/2013 07/31/2013  Sed Rate     0 - 22 mm/hr 42 (H) 29 (H)  52 (H) 43 (H)  Vitamin B6     2.1 - 21.7 ng/mL   51.7 (H) 41.5 (H)    EMG 07/01/2013: In summary, this is a complex NCS/EMG study with findings as follows:  1. 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 Overall, these changes are moderately severe in degree  electrically.  2. Alternatively, multilevel intraspinal canal lesions (i.e. radiculopathy, anterior horn cell disorder) cannot be excluded and repeat EMG in 3-6 months may be indicated to evaluate to degree of ongoing axon motor loss.  3. Right ulnar neuropathy at the elbow, moderate in degree electrically.  IMPRESSION: 1. Distal predominant weakness of hands and legs  - Clinically, patient appears to have stabilized  - Exam with diminished motor strength distally, brisk reflexes, and rare fasiculations  - No evidence of myopathy based on EMG and normal CK  - No evidence of GBS based on CSF testing  - EMG most consistent with a generalized sensorimotor PN, however multilevel intraspinal canal lesions (i.e. anterior horn cell disorder) is also possible with a superimposed neuropathy  - Prominent atrophy with brisk reflexes makes UMN process still a possibility with a superimposed PN.  Repeat EMG planned 2. Generalized sensorimotor polyneuropathy, length-dependent with active motor axon loss  - Work-up thus far has been extensive, but notable only for elevated vitamin B6.  MRI neuroaxis, CSF, and labs normal  - At this time, differential remains subacute peripheral neuropathy (unclear etiology- ?elevated B6, inflammatory process)   - Elevated vitamin B6 can cause sensory neuronopathy like he has, so patient stopped B6 supplementation.  3. Possible R pontine stroke, likely small vessel vs embolic from aortic arch calcification.   - Ulcerated aortic arch plaque  - He was previously on aspirin and switched to plavix   - Given that the left vertebral is dominant and filling the basilar, no intervention recommended for hypoplastic R vertebral artery besides medical management 4. Rheumatoid arthritis  - Previously on MTX 10mg  per week, stopped while on steroids  At the last visit, I had extensive discussion regarding referral to academic center for second opinion, pursuing a nerve biopsy, or empiric  trial of steroids given the active changes on his EMG (?subacute inflammatory polyneuropathy).  Patient would like to continue their care here and is not seeking another opinion at this time. A trial of high-dose steroids was given, but he has no signficant benefit so it would be reasonable to taper him off it.  Today on exam, there are rare fasciculations over his left FDI.  I would like to repeat his EMG because if there is worsening of active motor axon loss, motor neuron disease is a possibility.   PLAN/RECOMMENDATIONS:  1.  Taper prednisone as follows:  Day 1-5: 30mg  daily  Day 6-10:  20mg  daily  Day 11-15:  15mg  daily  Day 16-20: 10mg   Day 21-25:  5mg  daily  Day 26-35:  5mg  every other day  2.  Repeat EMG of the right side in January  3.  Check fasting lipid panel  4.  Awaiting results of paraneoplastic panel (complete) 5.  Okay to hold off on physical therapy for now as patient is not showing any improvement.  Encouraged home exercises and using a 4-wheeled rollator 6.  Home health aide declined  7.  Continue plavix 75mg  daily.  Pending results of lipid panel, start statin (developed rash to pravastatin). 8.  Return to clinic in  74-month   The duration of this appointment visit was 30 minutes of face-to-face time with the patient.  Greater than 50% of this time was spent in counseling, explanation of diagnosis, planning of further management, and coordination of care.   Thank you for allowing me to participate in patient's care.  If I can answer any additional questions, I would be pleased to do so.    Sincerely,    Sakeenah Valcarcel K. PPosey Pronto DO

## 2013-08-24 ENCOUNTER — Telehealth: Payer: Self-pay | Admitting: Neurology

## 2013-08-24 NOTE — Progress Notes (Signed)
faxed

## 2013-08-24 NOTE — Telephone Encounter (Signed)
Patient is awaiting lab results please review

## 2013-08-24 NOTE — Telephone Encounter (Signed)
Discussed that it will take several weeks for the paraneoplastic antibody panel to return.  Lipid panel shows mildly elevated LDL 105.  He apparently has not tolerated statins in the past, so have encouraged him to follow a low-fat diet.  His wife says he has small rash on his chest and face without itching or irritation.  I instructed her to keep an eye on it and to let me know if it worsens.  He is doing well tapering the prednisone so will have him reduce the dose every 3 days.  Taja Pentland K. Allena Katz, DO

## 2013-08-24 NOTE — Telephone Encounter (Signed)
Lab results available yet. Cell# [phone] / Sherri S.

## 2013-09-07 ENCOUNTER — Telehealth: Payer: Self-pay | Admitting: *Deleted

## 2013-09-07 NOTE — Telephone Encounter (Signed)
Patient wife called to see if labs were back and results

## 2013-09-07 NOTE — Telephone Encounter (Signed)
Please call [facility] and see if they have resulted his paraneoplastic panel.  I do not have any results yet. Thank you.

## 2013-09-08 ENCOUNTER — Telehealth: Payer: Self-pay | Admitting: Neurology

## 2013-09-08 NOTE — Telephone Encounter (Signed)
Waiting on [facility] to fax over results

## 2013-09-08 NOTE — Telephone Encounter (Signed)
Returned call to patient.  Results from [facility] rec'd dated 09/03/2013.  [facility] paraneoplastic panel is negative.  I had extensive discussion with Mr and Mrs Chad regarding his symptoms.  They report that his foot drop is worsening, there is new muscle twitches over the right arm, and weakness is worsening in the right hand.  Understandably, he has become very irritable and easily frustrated.  I offered medication for his mood (anti-depressant), but he is not interested.  Denies shortness of breath, dysphagia, but does endorse mild drooling.  At this time, work-up has been extensive.  I am planning on getting a repeat EMG early in January.  My concern is that he may have motor neuron disease.  All questions were answered.   Terry Bolotin K. Allena Katz, DO

## 2013-09-08 NOTE — Telephone Encounter (Signed)
Patient's wife calling again, pt is very agitated. Wants call ASAP!!!

## 2013-09-08 NOTE — Telephone Encounter (Signed)
Please call wife regarding patient labs / Sherri

## 2013-09-28 ENCOUNTER — Ambulatory Visit (INDEPENDENT_AMBULATORY_CARE_PROVIDER_SITE_OTHER): Payer: Medicare Other | Admitting: Neurology

## 2013-09-28 ENCOUNTER — Encounter: Payer: Self-pay | Admitting: Neurology

## 2013-09-28 DIAGNOSIS — G1229 Other motor neuron disease: Secondary | ICD-10-CM

## 2013-09-28 DIAGNOSIS — G5621 Lesion of ulnar nerve, right upper limb: Secondary | ICD-10-CM

## 2013-09-28 DIAGNOSIS — G608 Other hereditary and idiopathic neuropathies: Secondary | ICD-10-CM

## 2013-09-28 DIAGNOSIS — G5620 Lesion of ulnar nerve, unspecified upper limb: Secondary | ICD-10-CM

## 2013-09-28 NOTE — Progress Notes (Signed)
See procedure note for EMG results.  Brylynn Hanssen K. Saryn Cherry, DO  

## 2013-09-28 NOTE — Procedures (Addendum)
[facility] Neurology  [place], [address]  [place], [place] [address] Tel: [phone] Fax:  [phone] Test Date:  09/28/2013  Patient: Brett Castaneda DOB: July 11, 1941 Physician: Narda Amber, DO  Sex: Male Height: 5\' 11"  Ref Phys:   ID#: [id number] Temp: 32.6 Technician:    Patient Complaints: This is a 73 year-old man with progressive weakness of his arms and legs.  NCV & EMG Findings: Extensive evaluation of the right upper and lower extremity and mid-thoracic paraspinal muscles (T7 and T11 levels) shows the following:  1. Prolonged median and ulnar sensory responses with reduced median amplitude. The radial sensory response is preserved.  2. Motor responses of the median and ulnar nerves are reduced. The ulnar motor response at the first dorsal interosseous is absent. Overall, when compared to his previous NCS on 07/01/2013, and there has been a loss of motor amplitude.  3. Absent sural and superficial peroneal sensory responses. 4. Absent motor responses of the tibial and peroneal nerves.  Previously, peroneal motor response recorded at tibialis anterior was markedly reduced (1.55mV), and is absent on today's study. 5. In the upper extremity, there is active and chronic motor axonal loss involving C6-T1 myotomes. 6. In the lower extremity, there is active on chronic motor axon loss involving L3-S1 myotomes which has increased from the last study in which active changes were restricted to L5-S1-myotomes. 7. Thoracic paraspinal muscles did not show active denervation. 8. Fasiculation potential were infrequent and seen in 3/21 tested muscles.   Impression: 1. Generalized large fiber sensorimotor polyneuropathy, predominately axon loss in type, affecting the right side.  Overall, these changes are moderately severe in degree electrically. 2. There is evidence of a superimposed multilevel active on chronic intraspinal canal lesions affecting C6-T1 and L3-S1 roots/segments, which is at  least moderate in degree electrically.  These findings may result from lesions due to compression, among other etiologies, including degeneration of anterior horn cells.  Given the selective progression of active and chronic motor axon loss, an evolving disorder or anterior horn cells, as seen in motor neuron disease is likely. Based on electrodiagnostic findings alone, the changes are insufficient for the definitive electrodiagnosis of amyotrophic lateral sclerosis. 3. Right ulnar neuropathy at the elbow, moderate in degree electrically. 4. Right non-localizable median neuropathy, mild-moderate in degree electrically.   ___________________________ Narda Amber, DO    Nerve Conduction Studies Anti Sensory Summary Table   Site NR Peak (ms) Norm Peak (ms) P-T Amp (V) Norm P-T Amp  Right Median Anti Sensory (2nd Digit)  Wrist    4.4 <3.8 9.7 >10  Right Radial Anti Sensory (Base 1st Digit)  Wrist    2.5 <2.8 11.8 >10  Right Sup Peroneal Anti Sensory (Ant Lat Mall)  12 cm NR  <4.6  >3  Right Sural Anti Sensory (Lat Mall)  Calf NR  <4.6  >3  Right Ulnar Anti Sensory (5th Digit)  Wrist    3.7 <3.2 12.7 >5   Motor Summary Table   Site NR Onset (ms) Norm Onset (ms) O-P Amp (mV) Norm O-P Amp Site1 Site2 Delta-0 (ms) Dist (cm) Vel (m/s) Norm Vel (m/s)  Right Median Motor (Abd Poll Brev)  Wrist    4.1 <4.0 1.9 >5 Elbow Wrist 7.0 32.0 46 >50  Elbow    11.1  1.7  Post-exercise Elbow 6.9 0.0    Post-exercise    4.2  2.0         Right Peroneal Motor (Ext Dig Brev)  Ankle NR  <  6.0  >2.5 B Fib Ankle  0.0  >40  B Fib NR     Poplt B Fib  0.0  >40  Poplt NR            Right Peroneal TA Motor (Tib Ant)  Fib Head NR  <4.5  >3 Poplit Fib Head  0.0  >40  Poplit NR            Right Tibial Motor (Abd Hall Brev)  Ankle NR  <6.0  >4 Knee Ankle  0.0  >40  Knee NR            Right Ulnar Motor (Abd Dig Minimi)  Wrist    4.1 <3.1 2.5 >7 B Elbow Wrist 4.7 25.0 53 >50  B Elbow    8.8  2.3  A Elbow B Elbow  3.2 10.0 31 >50  A Elbow    12.0  2.1         Post-exercise    3.4  2.6         Right Ulnar (FDI) Motor (1st DI)  Wrist NR  <4.5  >7         EMG   Side Muscle Ins Act Fibs Psw Fasc Number Recrt Dur Dur. Amp Amp. Poly Poly. Comment  Right 1stDorInt Nml 2+ Nml 1+ 3- Rapid Most 1+ Most 1+ Most 1+ ATR  Right Abd Poll Brev Nml 2+ Nml Nml 3- Rapid All 1+ All 1+ All 1+ ATR  Right FlexPolLong Nml 2+ Nml Nml 3- Rapid All 1+ All 1+ Nml Nml N/A  Right ABD Dig Min Nml 1+ Nml 1+ SMU Rapid All 1+ All 1+ All 1+ ATR  Right Ext Indicis Nml 2+ Nml Nml 3- Rapid Most 1+ Most 1+ Nml Nml N/A  Right PronatorTeres Nml 3+ Nml Nml 2- Rapid Most 1+ Most 1+ Nml Nml N/A  Right Biceps Nml Nml Nml Nml 1- Mod-R Some 2+ Nml Nml Nml Nml MMAV  Right Triceps Nml Nml Nml Nml 1- Mod-R Some 1+ Some 1+ Some 1+ N/A  Right Deltoid Nml Nml Nml 1+ Nml Nml Nml Nml Nml Nml Nml Nml N/A  Right Cervical Parasp Low Nml Nml Nml Nml Nml Nml Nml Nml Nml Nml Nml Nml N/A  Right T6 Parasp Nml Nml Nml Nml Nml Nml Nml Nml Nml Nml Nml Nml N/A  Right T11 Parasp Nml Nml Nml Nml Nml Nml Nml Nml Nml Nml Nml Nml N/A  Right Lumbo Parasp Low Nml Nml Nml Nml Nml Nml Nml Nml Nml Nml Nml Nml N/A  Right GluteusMed Nml Nml Nml Nml Nml Nml Nml Nml Nml Nml Nml Nml N/A  Right VastusLat Nml 1+ Nml Nml 1- Mod-R Some 1+ Few 1+ Nml Nml N/A  Right AntTibialis Nml 1+ Nml Nml SMU Rapid All 1+ All 1+ All 1+ ATR  Right Gastroc Nml 2+ Nml Nml SMU Rapid All 1+ All 1+ Most 2+ MMAV  Right Flex Dig Long Nml 1+ Nml Nml None None - - - - - - ATR  Right Semimembranosus Nml 2+ Nml Nml 1- Mod-R Some 1+ Some 1+ Nml Nml N/A  Right BicepsFemS Nml 2+ Nml Nml Nml Nml Nml Nml Nml Nml Nml Nml N/A  Right AdductorLong Nml 1+ Nml Nml 1- Mod-R Few 1+ Nml Nml Nml Nml N/A   Waveforms:

## 2013-09-29 ENCOUNTER — Encounter: Payer: Self-pay | Admitting: Neurology

## 2013-10-07 ENCOUNTER — Ambulatory Visit (INDEPENDENT_AMBULATORY_CARE_PROVIDER_SITE_OTHER): Payer: Medicare Other | Admitting: Neurology

## 2013-10-07 ENCOUNTER — Encounter: Payer: Self-pay | Admitting: Neurology

## 2013-10-07 VITALS — BP 150/84 | HR 88 | Temp 98.0°F | Resp 14 | Ht 71.0 in | Wt 179.6 lb

## 2013-10-07 DIAGNOSIS — G1220 Motor neuron disease, unspecified: Secondary | ICD-10-CM

## 2013-10-07 DIAGNOSIS — G1221 Amyotrophic lateral sclerosis: Secondary | ICD-10-CM

## 2013-10-07 NOTE — Progress Notes (Signed)
[facility]  Follow-up Visit   Date: 10/07/2013    Brett Castaneda MRN: [id number] DOB: [birth year]   Interim History: Brett Castaneda is a 73 y.o. right-handed Caucasian male with history of HPL, HTN, RA (on MTX, intermittently on steroids), GERD, right retinal detachment, and stroke (2008, mild residual left sided weakness), and right carotid stenosis s/p CEA (2008) returning to the clinic for follow-up of falls and generalized weakness.  He was last seen in the clinic on 08/17/2013.  The patient was accompanied to the clinic by wife who provides additional history.  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 However, during the summer 2014, he started developing bilateral leg weakness and walking problems. His wife recall that he became acutely ill on 04/08/2013 because she was going with her husband to get her driver's license, he started feeling nauseated and sick. When she drove them home, he was took weak to get out of the car so she drove him to the Emergency Department. He was complaining of chest pain, shortness of breath, and back pain. Work-up was suggestive of community-acquired pneumonia with mild leukocytosis, EKG and cardiac enzymes were unremarkable and he was started on appropriate antibiotics and discharged home.   Since his discharge, he has not been doing well and especially started noticing problems with walking and feeling very unsteady. He had frequent falls, some of which were severe falls resulting in facial contusions and breaking his eye glasses. Of note, in May 2014, he had a tick bite on his leg and a the following month, his weakness started.  Lyme titers are negative.  11/7 follow-up:   He has had extensive work-up from his initial visit including MRI neuroaxis, CSF and laboratory testing (see below).  MRI brain shows  subacute possible right pontine stroke.  No evidence of CSF infection/inflammatory.  Serum labs indicate mild elevation in ESR and vitamin B6.  Recommended patient to stop B complex supplements.  Clinically, he continued to have progressive weakness of arms and legs and became more dependent on his wife for nearly all ADLs. 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   08/17/2013 follow-up:  No change with trial of prednisone, so it was gradually tapered off (stopped in December 2014).  New twitches noticed over the calf and arms.  Completed PT but it was stopped due to no improvement in symptoms.  Interval History:   He continues to have progressive symptoms of weakness, especially of the arms and legs.The muscle switches have become less apparent.  He has no new symptoms today.  He denies any interval falls because he is much more careful and walks with the walker.  Denies any problems with swallowing, talking, or any shortness of breath.  Repeat EMG was performed on 1/5 which showed worsening active changes in muscles which were previously unaffected (see below).  On a side note, they tell me that their children renovated their kitchen over the holidays.   Medications:  Current Outpatient Prescriptions on File Prior to Visit  Medication Sig Dispense Refill  . chlorpheniramine (CHLOR-TRIMETON) 4 MG tablet Take 4 mg by mouth daily as needed for allergies.      .Marland Kitchenclopidogrel (PLAVIX) 75 MG tablet Take 1 tablet (75 mg total) by mouth daily.  30 tablet  11  . famotidine (PEPCID) 20 MG tablet Take 20 mg by mouth daily.      Marland Kitchen latanoprost (XALATAN) 0.005 % ophthalmic solution Place 1 drop into the left eye at bedtime.       . methotrexate (RHEUMATREX) 2.5 MG tablet Take 10 mg by mouth 2 (two) times a week. Saturday and Sunday; Caution:Chemotherapy. Protect from light.      . Multiple  Vitamins-Minerals (ICAPS) CAPS Take 1 capsule by mouth 2 (two) times daily.      . polyethylene glycol (MIRALAX / GLYCOLAX) packet Take 17 g by mouth daily as needed (for constipation).      . Thiamine HCl (VITAMIN B-1) 250 MG tablet Take 250 mg by mouth daily.      . timolol (TIMOPTIC) 0.5 % ophthalmic solution Place 1 drop into the left eye daily.       Marland Kitchen VIRT-VITE FORTE 2.5-25-2 MG TABS take 1 tablet by mouth once daily  90 tablet  0   No current facility-administered medications on file prior to visit.    Allergies:  Allergies  Allergen Reactions  . Dopamine Anaphylaxis  . Morphine And Related Other (See Comments)    Hallucinations, very bad reactions  . Vicodin [Hydrocodone-Acetaminophen] Nausea And Vomiting     Review of Systems:  CONSTITUTIONAL: No fevers, chills, night sweats, or weight loss.   EYES: No visual changes or eye pain ENT: No hearing changes.  No history of nose bleeds.   RESPIRATORY: No cough, wheezing and shortness of breath.   CARDIOVASCULAR: Negative for chest pain, and palpitations.   GI: Negative for abdominal discomfort, blood in stools or black stools.  No recent change in bowel habits.   GU:  No history of incontinence.   MUSCLOSKELETAL: No joint pain or swelling.  No myalgias.   SKIN: Negative for lesions, rash, and itching.   ENDOCRINE: Negative for cold or heat intolerance, polydipsia or goiter.   PSYCH:  No depression or anxiety symptoms.   NEURO: As Above.   Vital Signs:  BP 150/84  Pulse 88  Temp(Src) 98 F (36.7 C)  Resp 14  Ht _0  (1.803 m)  Wt 179 lb 9.6 oz (81.466 kg)  BMI 25.06 kg/m2  Neurological Exam: MENTAL STATUS including orientation to time, place, person, recent and remote memory, attention span and concentration, language, and fund of knowledge is normal.  Speech is not dysarthric.  CRANIAL NERVES: II:  No visual field defects on the left eye. 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  III-IV-VI: Left pupil is briskly reactive.  Mildly disconjugate gaze with baseline right exotropia. Extraocular muscles of the left eye are intact.  V:  Normal facial sensation.  Jaw jerk is absent.   VII:  Subtle facial weakness of the frontalis, buccinator, orbicularis oculi, and orbicularis oris muscle (5-/5). Face is symmetric. Palomental reflex, snout, and Myerson's signs present.  IX-X:  Normal palatal movement.   XI:  Normal shoulder shrug and head rotation.   XII:  Normal tongue strength and range of motion, no deviation or fasciculations.  MOTOR:  Marked atrophy of the instrinsic hand muscles (ABP, ADM, FDI) and TA bilaterally. Rare fasciculations over the left FDI.  No pronator drift.  Tone is normal.    Right Upper Extremity:    Left Upper Extremity:    Deltoid  5/5   Deltoid  5/5   Biceps  5/5   Biceps  5/5   Triceps  5/5   Triceps  5/5   Wrist extensors  5/5   Wrist extensors  5/5   Wrist flexors  4+/5   Wrist flexors  4+/5   Finger extensors  5/5   Finger extensors  5/5   Finger flexors  5/5   Finger flexors  5/5   Dorsal interossei  4-/5   Dorsal interossei  4-/5   Abductor pollicis  4-/5   Abductor pollicis  4-/5   Tone (Ashworth scale)  0  Tone (Ashworth scale)  0   Right Lower Extremity:    Left Lower Extremity:    Hip flexors  5-/5   Hip flexors  5-/5   Hip extensors  5/5   Hip extensors  5/5   Knee flexors  5-/5   Knee flexors  5-/5   Knee extensors  5/5   Knee extensors  5/5   Dorsiflexors  2/5   Dorsiflexors  2/5   Plantarflexors  2/5   Plantarflexors  2/5   Toe extensors  0/5   Toe extensors  1/5   Toe flexors  1/5   Toe flexors  1/5   Tone (Ashworth scale)  0  Tone (Ashworth scale)  0   MSRs:  Right                                                                 Left brachioradialis 3+  brachioradialis 3+  biceps 3+  biceps 3+  triceps 3+  triceps 3+  Patellar* 4+  patellar 4+  ankle jerk 0  ankle jerk 0  Hoffman no  Hoffman no  plantar response down  plantar response up  Vertical spread with  patella testing to the shoulders  SENSORY:  Vibration is absent at the the ankles only and intact at the kness. Proprioception impaired at great toe bilaterally. Light touch, pin prick, and temperature is intact. Romberg's sign prominently positive.   COORDINATION/GAIT: Normal finger-to- nose-finger.  He is able to stand by pushing off the chair.  Gait is wide-based and unsteady when tested unassisted.   Data: MRI brain wwo 07/07/2013: ?tiny acute infarct right pons. Old right frontal infarct with encephalomalcia   MRI cervical spine wwo 07/07/2013: spondylotic changes C4-C5 and C5-C6 on the right with very mild right-sided cord flattening. Thyroid lesions can be assessed/ polyp with thyroid ultrasound.   MRI lumbar spine 05/31/2013: Spinal stenosis and degenerative changes most prominent L4-5 level   CT/A head and neck 49/82/6415: LICA siphon with calcified plaque, no stenosis. Mild cervical atherosclerosis. Right vertebral is patent, but very small in caliber. Extensive aortic arch and R subclavian artery plaque with ulcerated plaque in the arch. 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 There is note of bilateral upper lung nodules, up to 17 mm (no prior comparison study).   CSF 07/14/2013: R0 W0 G51 P31, IgG 2.4 CSF ACE, HSV, cytology, VDRL, WNV, MBP, OCB - neg  CSF Lyme, WNV, echovirus - neg  Serum: Vitamin B12, vit B1, vit E, MMA, copper, ceruloplasmin, CK, CRP, RF, Lyme titers, heavy metal screen,  HbA1c, SPEP/UPEP with IFE, zinc, Neocomplete paraneoplastic panel - all normal limits  *Abnormal:    Component     Latest Ref Rng 03/04/2013 05/22/2013 07/07/2013 07/31/2013  Sed Rate     0 - 22 mm/hr 42 (H) 29 (H) 52 (H) 43 (  H)  Vitamin B6     2.1 - 21.7 ng/mL   51.7 (H) 41.5 (H)    EMG 07/01/2013: In summary, this is a complex NCS/EMG study with findings as follows:  1. Generalized large fiber sensorimotor polyneuropathy, axon loss and demyelinating in type, affecting the right side  conforming to a gradient pattern. Overall, these changes are moderately severe in degree electrically.  2. Alternatively, multilevel intraspinal canal lesions (i.e. radiculopathy, anterior horn cell disorder) cannot be excluded and repeat EMG in 3-6 months may be indicated to evaluate to degree of ongoing axon motor loss.  3. Right ulnar neuropathy at the elbow, moderate in degree electrically.  EMG right side 09/28/2012:   1. Generalized large fiber sensorimotor polyneuropathy, predominately axon loss in type, affecting the right side. Overall, these changes are moderately severe in degree electrically.  2. There is evidence of a superimposed multilevel active on chronic intraspinal canal lesions affecting C6-T1 and L3-S1 roots/segments, which is at least moderate in degree electrically. These findings may result from lesions due to compression, among other etiologies, including degeneration of anterior horn cells. Given the selective progression of active and chronic motor axon loss, an evolving disorder or anterior horn cells, as seen in motor neuron disease is likely. Based on electrodiagnostic findings alone, the changes are insufficient for the definitive electrodiagnosis of amyotrophic lateral sclerosis.  3. Right ulnar neuropathy at the elbow, moderate in degree electrically. 4. Right non-localizable median neuropathy, mild-moderate in degree electrically.  Problem list - Severe neuropathy - Distal atrophy and weakness - Possible R pontine stroke, likely small vessel vs embolic from aortic arch calcification (06/2013).   - Ulcerated aortic arch plaque  - Continue plavix. He has not tolerated statins in the past  - Given that the left vertebral is dominant and filling the basilar, medical management recommended for hypoplastic R vertebral artery  - Rheumatoid arthritis  - on MTX 78m per week   IMPRESSION: Mr. WBerthais a 73year-old gentleman returning to the clinic for evaluation of  distal-predominant weakness, atrophy, and falls. Clinically, his weakness seems to be stable.  On exam, he has marked distal atrophy with diminished motor strength, brisk reflexes, pathological facial reflexes, and rare fasiculations.   His initial presentation was complex and work-up was extensive (imaging of the neuroaxis, CSF, and EMG).  Initial EMG was most consistent with a severe generalized sensorimotor PN, however multilevel intraspinal canal lesions (i.e. anterior horn cell disorder) is also possible.  Given the abrupt onset of symptoms and mild demyelinating features and active motor axon loss on EMG, a trial of prednisone for possible subacute inflammatory neuropathy was given, but there was no benefit.  Repeat EMG performed on 1/5 showed diffuse active on chronic motor axon loss with active changes in previously unaffected muscles.  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     I had extensive discussion regarding the pathophysiology, etiology, natural course, and management of motor neuron disease with patient and his wife.  I offered riluzole and mentioned risks and benefit.  Given the marginal benefit, Mr. WKrennis not interested in starting it.    At this time, management is symptomatic.  He denies any problems with swallowing, talking, or breathing.  He has completed PT for gait training.  Mood is fair, although wife says he can be irritable at times.  He declined my offer to start something to help his mood/depression.  I offered home health aide, but Mr. And Mrs Rumpf say they are handling things at this time.  I have provided a hand-out on ALS and will provide them with the local ALS chapter contact  information.   I will see him back in 6 weeks or sooner as needed.   The duration of this appointment visit was 45 minutes of face-to-face time with the patient.  Greater than 50% of this time was spent in counseling, explanation of diagnosis, planning of further management, and coordination of care.   Thank you for allowing me to participate in patient's care.  If I can answer any additional questions, I would be pleased to do so.    Sincerely,    Arsen Mangione K. Posey Pronto, DO

## 2013-10-07 NOTE — Patient Instructions (Signed)
The evaluation of your symptoms so far is most consistent with motor neuron disease, or amyotrophic lateral sclerosis (ALS).  If you develop new problems with talking, swallowing, or shortness of breath, please contact me. I would like to see you back in 6 weeks, unless you need me sooner.

## 2013-10-08 DIAGNOSIS — G1221 Amyotrophic lateral sclerosis: Secondary | ICD-10-CM | POA: Insufficient documentation

## 2013-10-09 ENCOUNTER — Other Ambulatory Visit: Payer: Self-pay

## 2013-10-09 NOTE — Progress Notes (Signed)
Added Fosamax to med list.

## 2013-10-12 NOTE — Progress Notes (Signed)
faxed

## 2013-10-13 ENCOUNTER — Ambulatory Visit: Payer: Self-pay | Admitting: Neurology

## 2013-10-19 ENCOUNTER — Ambulatory Visit (INDEPENDENT_AMBULATORY_CARE_PROVIDER_SITE_OTHER): Payer: Medicare Other | Admitting: Internal Medicine

## 2013-10-19 ENCOUNTER — Encounter: Payer: Self-pay | Admitting: Internal Medicine

## 2013-10-19 VITALS — BP 130/80 | HR 78 | Temp 98.6°F | Resp 20 | Ht 71.0 in | Wt 177.0 lb

## 2013-10-19 DIAGNOSIS — I10 Essential (primary) hypertension: Secondary | ICD-10-CM

## 2013-10-19 DIAGNOSIS — G1221 Amyotrophic lateral sclerosis: Secondary | ICD-10-CM

## 2013-10-19 DIAGNOSIS — R29898 Other symptoms and signs involving the musculoskeletal system: Secondary | ICD-10-CM

## 2013-10-19 DIAGNOSIS — Z8679 Personal history of other diseases of the circulatory system: Secondary | ICD-10-CM

## 2013-10-19 NOTE — Patient Instructions (Signed)
Limit your sodium (Salt) intake  Return in 3 months for follow-up  Neurology followup as scheduled

## 2013-10-19 NOTE — Progress Notes (Signed)
Subjective:    Patient ID: Brett Castaneda, male    DOB: 08-Feb-1941, 73 y.o.   MRN: [id number]  HPI  73 year old patient who has been followed closely by neurology with a new presumptive diagnosis of ALS. He is accompanied by his wife today. He has treated hypertension dyslipidemia and history of cerebrovascular disease. Denies any focal neurological symptoms. He continues to have progressive weakness and muscle atrophy involving his distal extremities. He now uses a walker full time.  Past Medical History  Diagnosis Date  . BASAL CELL CARCINOMA, FACE 03/31/2009  . 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 CONSTIPATION 06/22/2008  . DIVERTICULOSIS, COLON 03/24/2007  . GERD 03/24/2007  . HYPERLIPIDEMIA 08/27/2007  . HYPERTENSION 12/14/2008  . Rheumatoid arthritis(714.0) 03/24/2007  . Blind right eye   . Retinal detachment     hx of  . Keratosis Oct. 2013  . Fall at home Nov. 5, 2014  06-28-13    in the home and Outside as well  . 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 Stroke Oct 2014    History   Social History  . Marital Status: Married    Spouse Name: N/A    Number of Children: N/A  . Years of Education: N/A   Occupational History  . Not on file.   Social History Main Topics  . Smoking status: Former Smoker -- 23 years    Types: Cigarettes    Quit date: 09/24/1980  . Smokeless tobacco: Never Used  . Alcohol Use: No  . Drug Use: No  . Sexual Activity: Not on file   Other Topics Concern  . Not on file   Social History Narrative   Lives with wife of 63 years.  They have four children.  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     Past Surgical History  Procedure Laterality Date  . Total knee arthroplasty      bital  . Eye surgery      retinal detachment  . Carpal tunnel release    . Cataract extraction  1980    removed cataract in left eye. Currently blind in right eye  .  Cholecystectomy  2001    Gall Bladder  . Retinal detachment surgery  2004  . Lumbar puncture  Oct. 21, 2014  . Nerve conduction    . Mri  07-14-13  . Carotid endarterectomy Right 08-15-07    cea    Family History  Problem Relation Age of Onset  . Stroke Mother 75  . Heart disease Father   . Coronary artery disease Sister   . Heart disease Sister   . Diabetes Sister   . Hypertension Sister     Allergies  Allergen Reactions  . Dopamine Anaphylaxis  . Morphine And Related Other (See Comments)    Hallucinations, very bad reactions  . Vicodin [Hydrocodone-Acetaminophen] Nausea And Vomiting    Current Outpatient Prescriptions on File Prior to Visit  Medication Sig Dispense Refill  . alendronate (FOSAMAX) 70 MG tablet Take 70 mg by mouth once a week. Take with a full glass of water on an empty stomach.      . chlorpheniramine (CHLOR-TRIMETON) 4 MG tablet Take 4 mg by mouth daily as needed for allergies.      Marland Kitchen clopidogrel (PLAVIX) 75 MG tablet Take 1 tablet (75 mg total) by mouth daily.  30 tablet  11  . famotidine (  PEPCID) 20 MG tablet Take 20 mg by mouth daily.      Marland Kitchen latanoprost (XALATAN) 0.005 % ophthalmic solution Place 1 drop into the left eye at bedtime.       . methotrexate (RHEUMATREX) 2.5 MG tablet Take 10 mg by mouth 2 (two) times a week. Saturday and Sunday; Caution:Chemotherapy. Protect from light.      . polyethylene glycol (MIRALAX / GLYCOLAX) packet Take 17 g by mouth daily as needed (for constipation).      . timolol (TIMOPTIC) 0.5 % ophthalmic solution Place 1 drop into the left eye daily.       Marland Kitchen VIRT-VITE FORTE 2.5-25-2 MG TABS take 1 tablet by mouth once daily  90 tablet  0   No current facility-administered medications on file prior to visit.    BP 130/80  Pulse 78  Temp(Src) 98.6 F (37 C) (Oral)  Resp 20  Ht 5\' 11"  (1.803 m)  Wt 177 lb (80.287 kg)  BMI 24.70 kg/m2  SpO2 98%       Review of Systems  Constitutional: Negative for fever, chills,  appetite change and fatigue.  HENT: Negative for congestion, dental problem, ear pain, hearing loss, sore throat, tinnitus, trouble swallowing and voice change.   Eyes: Negative for pain, discharge and visual disturbance.  Respiratory: Negative for cough, chest tightness, wheezing and stridor.   Cardiovascular: Negative for chest pain, palpitations and leg swelling.  Gastrointestinal: Negative for nausea, vomiting, abdominal pain, diarrhea, constipation, blood in stool and abdominal distention.  Genitourinary: Negative for urgency, hematuria, flank pain, discharge, difficulty urinating and genital sores.  Musculoskeletal: Positive for arthralgias, gait problem and myalgias. Negative for back pain, joint swelling and neck stiffness.  Skin: Negative for rash.  Neurological: Positive for weakness. Negative for dizziness, syncope, speech difficulty, numbness and headaches.  Hematological: Negative for adenopathy. Does not bruise/bleed easily.  Psychiatric/Behavioral: Positive for dysphoric mood. Negative for behavioral problems. The patient is not nervous/anxious.        Objective:   Physical Exam  Constitutional: He is oriented to person, place, and time. He appears well-developed.  HENT:  Head: Normocephalic.  Right Ear: External ear normal.  Left Ear: External ear normal.  Eyes: Conjunctivae and EOM are normal.  Neck: Normal range of motion.  Cardiovascular: Normal rate and normal heart sounds.   Pulmonary/Chest: Breath sounds normal.  Abdominal: Bowel sounds are normal.  Musculoskeletal: Normal range of motion. He exhibits no edema and no tenderness.  Neurological: He is alert and oriented to person, place, and time.  Weakness involving distal extremities Marked muscle actually involved in the hands especially the thenar eminence  Psychiatric: He has a normal mood and affect. His behavior is normal.          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  Stable Cerebrovascular disease  Close followup neurology

## 2013-10-19 NOTE — Progress Notes (Signed)
Pre-visit discussion using our clinic review tool. No additional management support is needed unless otherwise documented below in the visit note.  

## 2013-10-22 ENCOUNTER — Telehealth: Payer: Self-pay | Admitting: *Deleted

## 2013-10-22 ENCOUNTER — Other Ambulatory Visit: Payer: Self-pay | Admitting: *Deleted

## 2013-10-22 DIAGNOSIS — G1221 Amyotrophic lateral sclerosis: Secondary | ICD-10-CM

## 2013-10-22 NOTE — Telephone Encounter (Signed)
No problem.  Please place a referral in the system as requested and make an appointment. Please also send my last clinic note, labs, and imaging.  I will give you a copy of his EMGs.   Linlee Cromie K. Posey Pronto, DO

## 2013-10-22 NOTE — Telephone Encounter (Signed)
Patient wife call stating they wanted a second opinion on his ALS and would like to go to [facility] Dr. Gustavus Bryant. They have already spoke with someone in his office was unsure if he had to be referred or what the process ws to make this happen.

## 2013-10-28 ENCOUNTER — Emergency Department (HOSPITAL_COMMUNITY)
Admission: EM | Admit: 2013-10-28 | Discharge: 2013-10-28 | Disposition: A | Discharge status: Home / Self Care | Payer: Medicare Other | Attending: Emergency Medicine | Admitting: Emergency Medicine

## 2013-10-28 ENCOUNTER — Emergency Department (HOSPITAL_COMMUNITY): Payer: Medicare Other

## 2013-10-28 ENCOUNTER — Telehealth: Payer: Self-pay | Admitting: Internal Medicine

## 2013-10-28 ENCOUNTER — Encounter (HOSPITAL_COMMUNITY): Payer: Self-pay | Admitting: Emergency Medicine

## 2013-10-28 DIAGNOSIS — R05 Cough: Secondary | ICD-10-CM | POA: Insufficient documentation

## 2013-10-28 DIAGNOSIS — M069 Rheumatoid arthritis, unspecified: Secondary | ICD-10-CM | POA: Insufficient documentation

## 2013-10-28 DIAGNOSIS — G1221 Amyotrophic lateral sclerosis: Secondary | ICD-10-CM | POA: Insufficient documentation

## 2013-10-28 DIAGNOSIS — R0602 Shortness of breath: Secondary | ICD-10-CM | POA: Insufficient documentation

## 2013-10-28 DIAGNOSIS — Z79899 Other long term (current) drug therapy: Secondary | ICD-10-CM | POA: Insufficient documentation

## 2013-10-28 DIAGNOSIS — H5440 Blindness, one eye, unspecified eye: Secondary | ICD-10-CM | POA: Insufficient documentation

## 2013-10-28 DIAGNOSIS — R059 Cough, unspecified: Secondary | ICD-10-CM | POA: Insufficient documentation

## 2013-10-28 DIAGNOSIS — I10 Essential (primary) hypertension: Secondary | ICD-10-CM | POA: Insufficient documentation

## 2013-10-28 DIAGNOSIS — R000 Tachycardia, unspecified: Secondary | ICD-10-CM | POA: Insufficient documentation

## 2013-10-28 DIAGNOSIS — J189 Pneumonia, unspecified organism: Secondary | ICD-10-CM

## 2013-10-28 DIAGNOSIS — Z87891 Personal history of nicotine dependence: Secondary | ICD-10-CM | POA: Insufficient documentation

## 2013-10-28 DIAGNOSIS — I69959 Hemiplegia and hemiparesis following unspecified cerebrovascular disease affecting unspecified side: Secondary | ICD-10-CM | POA: Insufficient documentation

## 2013-10-28 DIAGNOSIS — Z96659 Presence of unspecified artificial knee joint: Secondary | ICD-10-CM | POA: Insufficient documentation

## 2013-10-28 DIAGNOSIS — E785 Hyperlipidemia, unspecified: Secondary | ICD-10-CM | POA: Insufficient documentation

## 2013-10-28 DIAGNOSIS — Z7901 Long term (current) use of anticoagulants: Secondary | ICD-10-CM | POA: Insufficient documentation

## 2013-10-28 DIAGNOSIS — K219 Gastro-esophageal reflux disease without esophagitis: Secondary | ICD-10-CM | POA: Insufficient documentation

## 2013-10-28 DIAGNOSIS — R6889 Other general symptoms and signs: Secondary | ICD-10-CM

## 2013-10-28 DIAGNOSIS — Z8701 Personal history of pneumonia (recurrent): Secondary | ICD-10-CM | POA: Insufficient documentation

## 2013-10-28 HISTORY — DX: Amyotrophic lateral sclerosis: G12.21

## 2013-10-28 LAB — COMPREHENSIVE METABOLIC PANEL
ALT: 20 U/L (ref 0–53)
AST: 32 U/L (ref 0–37)
Albumin: 3 g/dL — ABNORMAL LOW (ref 3.5–5.2)
Alkaline Phosphatase: 67 U/L (ref 39–117)
BILIRUBIN TOTAL: 0.7 mg/dL (ref 0.3–1.2)
BUN: 9 mg/dL (ref 6–23)
CHLORIDE: 97 meq/L (ref 96–112)
CO2: 22 mEq/L (ref 19–32)
Calcium: 8.2 mg/dL — ABNORMAL LOW (ref 8.4–10.5)
Creatinine, Ser: 1 mg/dL (ref 0.50–1.35)
GFR, EST AFRICAN AMERICAN: 85 mL/min — AB (ref >90)
GFR, EST NON AFRICAN AMERICAN: 74 mL/min — AB (ref >90)
GLUCOSE: 123 mg/dL — AB (ref 70–99)
Potassium: 3.7 mEq/L (ref 3.7–5.3)
Sodium: 131 mEq/L — ABNORMAL LOW (ref 137–147)
Total Protein: 6.9 g/dL (ref 6.0–8.3)

## 2013-10-28 LAB — CBC WITH DIFFERENTIAL/PLATELET
Basophils Absolute: 0 10*3/uL (ref 0.0–0.1)
Basophils Relative: 0 % (ref 0–1)
EOS PCT: 1 % (ref 0–5)
Eosinophils Absolute: 0.1 10*3/uL (ref 0.0–0.7)
HCT: 34.2 % — ABNORMAL LOW (ref 39.0–52.0)
HEMOGLOBIN: 11.6 g/dL — AB (ref 13.0–17.0)
Lymphocytes Relative: 12 % (ref 12–46)
Lymphs Abs: 0.6 10*3/uL — ABNORMAL LOW (ref 0.7–4.0)
MCH: 31.5 pg (ref 26.0–34.0)
MCHC: 33.9 g/dL (ref 30.0–36.0)
MCV: 92.9 fL (ref 78.0–100.0)
MONO ABS: 0.5 10*3/uL (ref 0.1–1.0)
MONOS PCT: 10 % (ref 3–12)
NEUTROS ABS: 4 10*3/uL (ref 1.7–7.7)
Neutrophils Relative %: 77 % (ref 43–77)
Platelets: 199 10*3/uL (ref 150–400)
RBC: 3.68 MIL/uL — ABNORMAL LOW (ref 4.22–5.81)
RDW: 14.3 % (ref 11.5–15.5)
WBC: 5.2 10*3/uL (ref 4.0–10.5)

## 2013-10-28 LAB — URINALYSIS, ROUTINE W REFLEX MICROSCOPIC
Bilirubin Urine: NEGATIVE
Glucose, UA: NEGATIVE mg/dL
Hgb urine dipstick: NEGATIVE
Ketones, ur: NEGATIVE mg/dL
LEUKOCYTES UA: NEGATIVE
NITRITE: NEGATIVE
PROTEIN: NEGATIVE mg/dL
Specific Gravity, Urine: 1.021 (ref 1.005–1.030)
UROBILINOGEN UA: 1 mg/dL (ref 0.0–1.0)
pH: 6.5 (ref 5.0–8.0)

## 2013-10-28 LAB — CG4 I-STAT (LACTIC ACID): Lactic Acid, Venous: 1.48 mmol/L (ref 0.5–2.2)

## 2013-10-28 MED ORDER — OSELTAMIVIR PHOSPHATE 75 MG PO CAPS: 75.0000 mg | ORAL_CAPSULE | Freq: Two times a day (BID) | ORAL | Status: DC

## 2013-10-28 MED ORDER — LEVOFLOXACIN 500 MG PO TABS: 500.0000 mg | ORAL_TABLET | Freq: Every day | ORAL | Status: DC

## 2013-10-28 MED ORDER — DEXTROSE 5 % IV SOLN: 1.0000 g | Freq: Once | INTRAVENOUS | Status: DC

## 2013-10-28 MED ORDER — SODIUM CHLORIDE 0.9 % IV BOLUS (SEPSIS)
1000.0000 mL | Freq: Once | INTRAVENOUS | Status: AC
Start: 2013-10-28 — End: 2013-10-28
  Administered 2013-10-28: 1000 mL via INTRAVENOUS

## 2013-10-28 MED ORDER — DEXTROSE 5 % IV SOLN: 500.0000 mg | Freq: Once | INTRAVENOUS | Status: DC

## 2013-10-28 MED ORDER — DEXTROSE 5 % IV SOLN
1.0000 g | Freq: Once | INTRAVENOUS | Status: AC
  Administered 2013-10-28: 1 g via INTRAVENOUS
  Filled 2013-10-28: qty 10

## 2013-10-28 MED ORDER — AZITHROMYCIN 500 MG IV SOLR
500.0000 mg | Freq: Once | INTRAVENOUS | Status: AC
  Administered 2013-10-28: 500 mg via INTRAVENOUS

## 2013-10-28 NOTE — ED Notes (Signed)
Per EMS: Pt from home w/ sore throat, cough x 3 days.  Fever started today.  Hx of pna.  Just dx w/ als.

## 2013-10-28 NOTE — Discharge Instructions (Signed)
Pneumonia, Adult °Pneumonia is an infection of the lungs.  °CAUSES °Pneumonia may be caused by bacteria or a virus. Usually, these infections are caused by breathing infectious particles into the lungs (respiratory tract). °SYMPTOMS  °· Cough. °· Fever. °· Chest pain. °· Increased rate of breathing. °· Wheezing. °· Mucus production. °DIAGNOSIS  °If you have the common symptoms of pneumonia, your caregiver will typically confirm the diagnosis with a chest X-ray. The X-ray will show an abnormality in the lung (pulmonary infiltrate) if you have pneumonia. Other tests of your blood, urine, or sputum may be done to find the specific cause of your pneumonia. Your caregiver may also do tests (blood gases or pulse oximetry) to see how well your lungs are working. °TREATMENT  °Some forms of pneumonia may be spread to other people when you cough or sneeze. You may be asked to wear a mask before and during your exam. Pneumonia that is caused by bacteria is treated with antibiotic medicine. Pneumonia that is caused by the influenza virus may be treated with an antiviral medicine. Most other viral infections must run their course. These infections will not respond to antibiotics.  °PREVENTION °A pneumococcal shot (vaccine) is available to prevent a common bacterial cause of pneumonia. This is usually suggested for: °· People over 65 years old. °· Patients on chemotherapy. °· People with chronic lung problems, such as bronchitis or emphysema. °· People with immune system problems. °If you are over 65 or have a high risk condition, you may receive the pneumococcal vaccine if you have not received it before. In some countries, a routine influenza vaccine is also recommended. This vaccine can help prevent some cases of pneumonia. You may be offered the influenza vaccine as part of your care. °If you smoke, it is time to quit. You may receive instructions on how to stop smoking. Your caregiver can provide medicines and counseling to  help you quit. °HOME CARE INSTRUCTIONS  °· Cough suppressants may be used if you are losing too much rest. However, coughing protects you by clearing your lungs. You should avoid using cough suppressants if you can. °· Your caregiver may have prescribed medicine if he or she thinks your pneumonia is caused by a bacteria or influenza. Finish your medicine even if you start to feel better. °· Your caregiver may also prescribe an expectorant. This loosens the mucus to be coughed up. °· Only take over-the-counter or prescription medicines for pain, discomfort, or fever as directed by your caregiver. °· Do not smoke. Smoking is a common cause of bronchitis and can contribute to pneumonia. If you are a smoker and continue to smoke, your cough may last several weeks after your pneumonia has cleared. °· A cold steam vaporizer or humidifier in your room or home may help loosen mucus. °· Coughing is often worse at night. Sleeping in a semi-upright position in a recliner or using a couple pillows under your head will help with this. °· Get rest as you feel it is needed. Your body will usually let you know when you need to rest. °SEEK IMMEDIATE MEDICAL CARE IF:  °· Your illness becomes worse. This is especially true if you are elderly or weakened from any other disease. °· You cannot control your cough with suppressants and are losing sleep. °· You begin coughing up blood. °· You develop pain which is getting worse or is uncontrolled with medicines. °· You have a fever. °· Any of the symptoms which initially brought you in for treatment   are getting worse rather than better.  You develop shortness of breath or chest pain. MAKE SURE YOU:   Understand these instructions.  Will watch your condition.  Will get help right away if you are not doing well or get worse. Document Released: 09/10/2005 Document Revised: 12/03/2011 Document Reviewed: 11/30/2010 [facility] Patient Information 2014 [place], [place]. Viral  Infections A viral infection can be caused by different types of viruses.Most viral infections are not serious and resolve on their own. However, some infections may cause severe symptoms and may lead to further complications. SYMPTOMS Viruses can frequently cause:  Minor sore throat.  Aches and pains.  Headaches.  Runny nose.  Different types of rashes.  Watery eyes.  Tiredness.  Cough.  Loss of appetite.  Gastrointestinal infections, resulting in nausea, vomiting, and diarrhea. These symptoms do not respond to antibiotics because the infection is not caused by bacteria. However, you might catch a bacterial infection following the viral infection. This is sometimes called a "superinfection." Symptoms of such a bacterial infection may include:  Worsening sore throat with pus and difficulty swallowing.  Swollen neck glands.  Chills and a high or persistent fever.  Severe headache.  Tenderness over the sinuses.  Persistent overall ill feeling (malaise), muscle aches, and tiredness (fatigue).  Persistent cough.  Yellow, green, or brown mucus production with coughing. HOME CARE INSTRUCTIONS   Only take over-the-counter or prescription medicines for pain, discomfort, diarrhea, or fever as directed by your caregiver.  Drink enough water and fluids to keep your urine clear or pale yellow. Sports drinks can provide valuable electrolytes, sugars, and hydration.  Get plenty of rest and maintain proper nutrition. Soups and broths with crackers or rice are fine. SEEK IMMEDIATE MEDICAL CARE IF:   You have severe headaches, shortness of breath, chest pain, neck pain, or an unusual rash.  You have uncontrolled vomiting, diarrhea, or you are unable to keep down fluids.  You or your child has an oral temperature above 102 F (38.9 C), not controlled by medicine.  Your baby is older than 3 months with a rectal temperature of 102 F (38.9 C) or higher.  Your baby is 16 months  old or younger with a rectal temperature of 100.4 F (38 C) or higher. MAKE SURE YOU:   Understand these instructions.  Will watch your condition.  Will get help right away if you are not doing well or get worse. Document Released: 06/20/2005 Document Revised: 12/03/2011 Document Reviewed: 01/15/2011 [facility] Patient Information 2014 [place], [place].

## 2013-10-28 NOTE — Progress Notes (Signed)
Attempted with patient NIF -11, decent effort

## 2013-10-28 NOTE — Telephone Encounter (Signed)
Relevant patient education mailed to patient.  

## 2013-10-28 NOTE — ED Provider Notes (Signed)
CSN: [id number]     Arrival date & time 10/28/13  1746 History   First MD Initiated Contact with Patient 10/28/13 1748     Chief Complaint  Patient presents with  . Fever  . Altered Mental Status   (Consider location/radiation/quality/duration/timing/severity/associated sxs/prior Treatment) HPI  This a 73 year old male with history of CVA, hyperlipidemia, hypertension, and recent diagnosis of ALS who presents with fever, cough, and sore throat. Patient reports fever started today. In triage noted to be 103. Patient reports cough and shortness of breath. Denies any chest pain. Cough is nonproductive.  Denies any recent hospitalizations.  Does report recent history of pneumonia in the last 2-3 months. He was managed as an outpatient. Per EMS, noted to be tachycardic.  Past Medical History  Diagnosis Date  . BASAL CELL CARCINOMA, FACE 03/31/2009  . 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 CONSTIPATION 06/22/2008  . DIVERTICULOSIS, COLON 03/24/2007  . GERD 03/24/2007  . HYPERLIPIDEMIA 08/27/2007  . HYPERTENSION 12/14/2008  . Rheumatoid arthritis(714.0) 03/24/2007  . Blind right eye   . Retinal detachment     hx of  . Keratosis Oct. 2013  . Fall at home Nov. 5, 2014  06-28-13    in the home and Outside as well  . 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 Stroke Oct 2014  . 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 Total knee arthroplasty      bital  . Carpal tunnel release Right 2003  . Cataract extraction  1980    removed cataract in left eye. Currently blind in right eye  . Cholecystectomy  2001    Gall Bladder  . Retinal detachment surgery  2004  . Lumbar puncture  Oct. 21, 2014  . Nerve conduction    . Mri  07-14-13  . Carotid endarterectomy Right 08-15-07    cea  . Joint replacement  2000    Right knee  replacement  . Eye surgery  [id number]    retinal detachment  . Eye surgery      left cataract surgery  . Shoulder surgery Left 2009    hx "frozen" shoulder   Family History  Problem Relation Age of Onset  . Stroke Mother 33  . Heart disease Father   . Coronary artery disease Sister   . Heart disease Sister   . Diabetes Sister   . Hypertension Sister    History  Substance Use Topics  . Smoking status: Former Smoker -- 23 years    Types: Cigarettes    Quit date: 09/24/1980  . Smokeless tobacco: Never Used  . Alcohol Use: No    Review of Systems  Constitutional: Positive for fever.  HENT: Positive for sore throat.   Respiratory: Positive for cough and shortness of breath. Negative for chest tightness.   Cardiovascular: Negative.  Negative for chest pain.  Gastrointestinal: Negative.  Negative for nausea, vomiting and abdominal pain.  Genitourinary: Negative.  Negative for dysuria.  Musculoskeletal: Negative for back pain.  Skin: Negative for rash.  Neurological: Negative for headaches.  All other systems reviewed and are negative.    Allergies  Dopamine; Morphine and related; and Vicodin  Home Medications   Current Outpatient Rx  Name  Route  Sig  Dispense  Refill  . acetaminophen (TYLENOL) 500 MG tablet  Oral   Take 500 mg by mouth every 6 (six) hours as needed for fever.         Marland Kitchen alendronate (FOSAMAX) 70 MG tablet   Oral   Take 70 mg by mouth once a week. Take with a full glass of water on an empty stomach. Takes on Tuesday         . chlorpheniramine (CHLOR-TRIMETON) 4 MG tablet   Oral   Take 8 mg by mouth at bedtime as needed for allergies.          Marland Kitchen clopidogrel (PLAVIX) 75 MG tablet   Oral   Take 1 tablet (75 mg total) by mouth daily.   30 tablet   11   . famotidine (PEPCID) 20 MG tablet   Oral   Take 20 mg by mouth daily.         . folic acid-pyridoxine-cyancobalamin (VIRT-VITE FORTE) 2.5-25-2 MG TABS   Oral   Take 1 tablet by mouth  2 (two) times a week. Takes on days he takes methotrexate. (Saturday, Sunday)         . latanoprost (XALATAN) 0.005 % ophthalmic solution   Left Eye   Place 1 drop into the left eye at bedtime.          . methotrexate (RHEUMATREX) 2.5 MG tablet   Oral   Take 10 mg by mouth 2 (two) times a week. Saturday and Sunday; Caution:Chemotherapy. Protect from light.         . timolol (TIMOPTIC) 0.5 % ophthalmic solution   Left Eye   Place 1 drop into the left eye daily.          Marland Kitchen levofloxacin (LEVAQUIN) 500 MG tablet   Oral   Take 1 tablet (500 mg total) by mouth daily.   7 tablet   0   . oseltamivir (TAMIFLU) 75 MG capsule   Oral   Take 1 capsule (75 mg total) by mouth every 12 (twelve) hours.   10 capsule   0   . polyethylene glycol (MIRALAX / GLYCOLAX) packet   Oral   Take 17 g by mouth daily as needed (for constipation).          BP 136/79  Pulse 105  Temp(Src) 98.4 F (36.9 C) (Oral)  Resp 21  SpO2 98% Physical Exam  Nursing note and vitals reviewed. Constitutional: He is oriented to person, place, and time.  Ill-appearing but nontoxic, no acute distress  HENT:  Head: Normocephalic and atraumatic.  Mucous membranes dry  Eyes:  Disconjugate gaze, left pupil 3 mm and reactive  Neck: Neck supple.  Cardiovascular: Regular rhythm and normal heart sounds.   No murmur heard. Tachycardia  Pulmonary/Chest: Effort normal. No respiratory distress. He has no wheezes. He has rales. He exhibits no tenderness.  Abdominal: Soft. Bowel sounds are normal. There is no tenderness. There is no rebound.  Musculoskeletal: He exhibits no edema.  Lymphadenopathy:    He has no cervical adenopathy.  Neurological: He is alert and oriented to person, place, and time.  Skin: Skin is warm and dry. No rash noted.  Psychiatric: He has a normal mood and affect.    ED Course  Procedures (including critical care time) Labs Review Labs Reviewed  CBC WITH DIFFERENTIAL - Abnormal;  Notable for the following:    RBC 3.68 (*)    Hemoglobin 11.6 (*)    HCT 34.2 (*)    Lymphs Abs 0.6 (*)    All other components within normal  limits  COMPREHENSIVE METABOLIC PANEL - Abnormal; Notable for the following:    Sodium 131 (*)    Glucose, Bld 123 (*)    Calcium 8.2 (*)    Albumin 3.0 (*)    GFR calc non Af Amer 74 (*)    GFR calc Af Amer 85 (*)    All other components within normal limits  CULTURE, BLOOD (ROUTINE X 2)  CULTURE, BLOOD (ROUTINE X 2)  URINE CULTURE  URINALYSIS, ROUTINE W REFLEX MICROSCOPIC  CG4 I-STAT (LACTIC ACID)   Imaging Review Dg Chest Port 1 View  10/28/2013   CLINICAL DATA:  Fever and altered mental status. Coughing congestion.  EXAM: PORTABLE CHEST - 1 VIEW  COMPARISON:  CT CHEST W/CM dated 07/23/2013; DG CHEST 2 VIEW dated 04/09/2013  FINDINGS: Chronic interstitial lung disease, particularly affecting the lung bases and lung periphery, likely from pulmonary fibrosis in setting of usual interstitial pneumonitis. Basilar honeycombing. Appearance similar to recent CT scan of 07/23/2013, without obvious progression since that exam, although the basilar opacities are mildly increased compared to the prior exam of 04/09/2013.  Atherosclerotic aortic arch.  IMPRESSION: 1. 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 Reduced positive predictive value for early pneumonia due to the severity of the underlying architectural distortion and basilar opacities.   Electronically Signed   By: Sherryl Barters M.D.   On: 10/28/2013 18:35    EKG Interpretation   None       MDM   1. Community acquired pneumonia   2. Flu-like symptoms    Patient presents with shortness of breath, fever, and generalized malaise. History of ALS. Initially febrile and tachycardic, initial evaluation. Blood pressure normal.  Sepsis labwork was initiated. Patient  was given 1 L normal saline. He was given Rocephin and azithromycin for presumed immunity or pneumonia. Chest x-ray shows mildly increased bibasilar opacities.  Lab work is notable for mild hyponatremia at 131.  Patient has a normal lactate. Patient responded to fluids and by rate less than 100. He is to improved.  Normal NIF. Discussed possible disposition with the patient and his wife. Per the patient stable for discharge home with close followup with PCP. Will treat for flu and pneumonia. Patient has an appointment tomorrow at 10 AM. Patient is very anxious about being discharged home as he does not feel comfortable that his wife carries most of the burdenof his care. The wife states that she is comfortable taking him home and has help.  They were given strict return precautions.  After history, exam, and medical workup I feel the patient has been appropriately medically screened and is safe for discharge home. Pertinent diagnoses were discussed with the patient. Patient was given return precautions.     Merryl Hacker, MD 10/28/13 2208

## 2013-10-29 ENCOUNTER — Encounter: Payer: Self-pay | Admitting: Internal Medicine

## 2013-10-29 ENCOUNTER — Ambulatory Visit (INDEPENDENT_AMBULATORY_CARE_PROVIDER_SITE_OTHER): Payer: Medicare Other | Admitting: Internal Medicine

## 2013-10-29 VITALS — BP 120/74 | HR 93 | Temp 99.6°F | Resp 20 | Ht 71.0 in | Wt 182.0 lb

## 2013-10-29 DIAGNOSIS — I10 Essential (primary) hypertension: Secondary | ICD-10-CM

## 2013-10-29 DIAGNOSIS — J189 Pneumonia, unspecified organism: Secondary | ICD-10-CM

## 2013-10-29 DIAGNOSIS — G1221 Amyotrophic lateral sclerosis: Secondary | ICD-10-CM

## 2013-10-29 LAB — URINE CULTURE
Colony Count: NO GROWTH
Culture: NO GROWTH

## 2013-10-29 NOTE — Progress Notes (Signed)
Subjective:    Patient ID: Brett Castaneda, male    DOB: [birth year], 73 y.o.   MRN: [id number]  HPI  73 year old patient who is seen today in followup. He has been ill for the past 2 or 3 days and worsened yesterday with fever to 103. He became delirious and was seen in the ED and treated for dehydration. Chest x-ray revealed chronic interstitial changes. He wished it with azithromycin as well as IV Levaquin. Today he feels much improved although slightly weak. He continues to have a mild nonproductive cough. His oral intake is marginal. He has been using Tylenol with improved temperature control. He was given a prescription for both Levaquin and Tamiflu  Past Medical History  Diagnosis Date  . BASAL CELL CARCINOMA, FACE 03/31/2009  . 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 CONSTIPATION 06/22/2008  . DIVERTICULOSIS, COLON 03/24/2007  . GERD 03/24/2007  . HYPERLIPIDEMIA 08/27/2007  . HYPERTENSION 12/14/2008  . Rheumatoid arthritis(714.0) 03/24/2007  . Blind right eye   . Retinal detachment     hx of  . Keratosis Oct. 2013  . Fall at home Nov. 5, 2014  06-28-13    in the home and Outside as well  . 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 Stroke Oct 2014  . 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 at 11/09/13    History   Social History  . Marital Status: Married    Spouse Name: N/A    Number of Children: N/A  . Years of Education: N/A   Occupational History  . Not on file.   Social History Main Topics  . Smoking status: Former Smoker -- 23 years    Types: Cigarettes    Quit date: 09/24/1980  . Smokeless tobacco: Never Used  . Alcohol Use: No  . Drug Use: No  . Sexual Activity: Not on file   Other Topics Concern  . Not on file   Social History Narrative   Lives with wife of 68 years.  They have four children.  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     Past Surgical History  Procedure Laterality Date  . Total knee arthroplasty      bital  . Carpal tunnel release Right 2003  . Cataract extraction  1980    removed cataract in left eye. Currently blind in right eye  . Cholecystectomy  2001    Gall Bladder  . Retinal detachment surgery  2004  . Lumbar puncture  Oct. 21, 2014  . Nerve conduction    . Mri  07-14-13  . Carotid endarterectomy Right 08-15-07    cea  . Joint replacement  2000    Right knee replacement  . Eye surgery  [id number]    retinal detachment  . Eye surgery      left cataract surgery  . Shoulder surgery Left 2009    hx "frozen" shoulder    Family History  Problem Relation Age of Onset  . Stroke Mother 65  . Heart disease Father   . Coronary artery disease Sister   . Heart disease Sister   . Diabetes Sister   . Hypertension Sister     Allergies  Allergen Reactions  . Dopamine Anaphylaxis  . Morphine And Related Other (See Comments)    Hallucinations, very bad reactions  . Vicodin [Hydrocodone-Acetaminophen]  Nausea And Vomiting    Current Outpatient Prescriptions on File Prior to Visit  Medication Sig Dispense Refill  . acetaminophen (TYLENOL) 500 MG tablet Take 500 mg by mouth every 6 (six) hours as needed for fever.      Marland Kitchen alendronate (FOSAMAX) 70 MG tablet Take 70 mg by mouth once a week. Take with a full glass of water on an empty stomach. Takes on Tuesday      . chlorpheniramine (CHLOR-TRIMETON) 4 MG tablet Take 8 mg by mouth at bedtime as needed for allergies.       Marland Kitchen clopidogrel (PLAVIX) 75 MG tablet Take 1 tablet (75 mg total) by mouth daily.  30 tablet  11  . famotidine (PEPCID) 20 MG tablet Take 20 mg by mouth daily.      . folic acid-pyridoxine-cyancobalamin (VIRT-VITE FORTE) 2.5-25-2 MG TABS Take 1 tablet by mouth 2 (two) times a week. Takes on days he takes methotrexate. (Saturday, Sunday)      . latanoprost (XALATAN) 0.005 % ophthalmic solution Place 1 drop into  the left eye at bedtime.       . methotrexate (RHEUMATREX) 2.5 MG tablet Take 10 mg by mouth 2 (two) times a week. Saturday and Sunday; Caution:Chemotherapy. Protect from light.      . polyethylene glycol (MIRALAX / GLYCOLAX) packet Take 17 g by mouth daily as needed (for constipation).      . timolol (TIMOPTIC) 0.5 % ophthalmic solution Place 1 drop into the left eye daily.       Marland Kitchen levofloxacin (LEVAQUIN) 500 MG tablet Take 1 tablet (500 mg total) by mouth daily.  7 tablet  0  . oseltamivir (TAMIFLU) 75 MG capsule Take 1 capsule (75 mg total) by mouth every 12 (twelve) hours.  10 capsule  0   No current facility-administered medications on file prior to visit.    BP 120/74  Pulse 93  Temp(Src) 99.6 F (37.6 C) (Oral)  Resp 20  Ht 5\' 11"  (1.803 m)  Wt 182 lb (82.555 kg)  BMI 25.40 kg/m2  SpO2 98%       Review of Systems  Constitutional: Positive for fever, activity change, appetite change and fatigue. Negative for chills.  HENT: Negative for congestion, dental problem, ear pain, hearing loss, sore throat, tinnitus, trouble swallowing and voice change.   Eyes: Negative for pain, discharge and visual disturbance.  Respiratory: Positive for cough. Negative for chest tightness, wheezing and stridor.   Cardiovascular: Negative for chest pain, palpitations and leg swelling.  Gastrointestinal: Negative for nausea, vomiting, abdominal pain, diarrhea, constipation, blood in stool and abdominal distention.  Genitourinary: Negative for urgency, hematuria, flank pain, discharge, difficulty urinating and genital sores.  Musculoskeletal: Negative for arthralgias, back pain, gait problem, joint swelling, myalgias and neck stiffness.  Skin: Negative for rash.  Neurological: Negative for dizziness, syncope, speech difficulty, weakness, numbness and headaches.  Hematological: Negative for adenopathy. Does not bruise/bleed easily.  Psychiatric/Behavioral: Negative for behavioral problems and  dysphoric mood. The patient is not nervous/anxious.        Objective:   Physical Exam  Constitutional: He is oriented to person, place, and time. He appears well-developed and well-nourished. No distress.  HENT:  Head: Normocephalic.  Right Ear: External ear normal.  Left Ear: External ear normal.  Eyes: Conjunctivae and EOM are normal.  Neck: Normal range of motion.  Cardiovascular: Normal rate, regular rhythm and normal heart sounds.   Pulmonary/Chest: Breath sounds normal. No respiratory distress. He has no wheezes. He has  no rales.  Abdominal: Bowel sounds are normal.  Musculoskeletal: Normal range of motion. He exhibits no edema and no tenderness.  Neurological: He is alert and oriented to person, place, and time.  Psychiatric: He has a normal mood and affect. His behavior is normal.          Assessment & Plan:   Probable viral URI with cough. The patient has received 1 g of azithromycin IV and Rocephin yesterday. We'll complete 5 days of Tamiflu but will withhold further antibiotic therapy. We'll treat the fever aggressively ALS. Followup neurology Hypertension stable  Problem List Items Addressed This Visit   HYPERTENSION - Primary   CAP (community acquired pneumonia)   ALS (amyotrophic lateral sclerosis)

## 2013-10-29 NOTE — Progress Notes (Signed)
Pre-visit discussion using our clinic review tool. No additional management support is needed unless otherwise documented below in the visit note.  

## 2013-10-29 NOTE — Patient Instructions (Signed)
Take over-the-counter expectorants and cough medications such as  Mucinex DM.  Call if there is no improvement in 5 to 7 days or if he developed worsening cough, fever, or new symptoms, such as shortness of breath or chest pain.  Take [phone] mg of Tylenol every 6 hours as needed for pain relief or fever.  Avoid taking more than 3000 mg in a 24-hour period (  This may cause liver damage).  Call or return to clinic prn if these symptoms worsen or fail to improve as anticipated.

## 2013-11-03 LAB — CULTURE, BLOOD (ROUTINE X 2)
CULTURE: NO GROWTH
Culture: NO GROWTH

## 2013-11-20 ENCOUNTER — Ambulatory Visit (INDEPENDENT_AMBULATORY_CARE_PROVIDER_SITE_OTHER): Payer: Medicare Other | Admitting: Neurology

## 2013-11-20 ENCOUNTER — Encounter: Payer: Self-pay | Admitting: Neurology

## 2013-11-20 VITALS — BP 136/84 | HR 88 | Temp 97.6°F | Wt 179.1 lb

## 2013-11-20 DIAGNOSIS — G1221 Amyotrophic lateral sclerosis: Secondary | ICD-10-CM

## 2013-11-20 DIAGNOSIS — G1220 Motor neuron disease, unspecified: Secondary | ICD-10-CM

## 2013-11-20 DIAGNOSIS — G63 Polyneuropathy in diseases classified elsewhere: Secondary | ICD-10-CM

## 2013-11-20 MED ORDER — AMITRIPTYLINE HCL 25 MG PO TABS: 25.0000 mg | ORAL_TABLET | Freq: Every day | ORAL | Status: DC

## 2013-11-20 NOTE — Progress Notes (Signed)
[facility]  Follow-up Visit   Date: 11/20/2013    Brett Castaneda MRN: [id number] DOB: Dec 10, 1940   Interim History: Brett Castaneda is a 73 y.o. right-handed Caucasian male with history of HPL, HTN, RA (on MTX, intermittently on steroids), GERD, right retinal detachment, and stroke (2008, mild residual left sided weakness), and right carotid stenosis s/p CEA (2008) returning to the clinic for follow-up of falls and generalized weakness.  He was last seen in the clinic on 10/07/2013.  The patient was accompanied to the clinic by wife who provides additional history.  History of present illness: Mr. 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 However, during the summer 2014, he started developing bilateral leg weakness and walking problems. His wife recalls that he became acutely ill on 04/08/2013 because she was going with her husband to get her driver's license, he started feeling nauseated and sick. When she drove them home, he was took weak to get out of the car so she drove him to the Emergency Department. He was complaining of chest pain, shortness of breath, and back pain. Work-up was suggestive of community-acquired pneumonia with mild leukocytosis, EKG and cardiac enzymes were unremarkable and he was started on appropriate antibiotics and discharged home.  Since his discharge, he had worsening problems with walking and falls, some of which were severe falls resulting in facial contusions and breaking his eye glasses. Of note, in May 2014, he had a tick bite on his leg and a the following month, his weakness started.  Lyme titers are negative.  - Follow-up 07/31/2013:  He has had extensive work-up from his initial visit including MRI neuroaxis, CSF and laboratory testing (see below).  MRI brain shows subacute possible right pontine stroke.  No evidence of CSF  infection/inflammatory.  Serum labs indicate mild elevation in ESR and vitamin B6.  Recommended patient to stop B complex supplements.  Clinically, he continued to have progressive weakness of arms and legs and became more dependent on his wife for nearly all ADLs. 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   - Follow-up 08/17/2013:  No change with trial of prednisone, so it was gradually tapered off (stopped in December 2014).  New twitches noticed over the calf and arms.  Completed PT but it was stopped due to no improvement in symptoms.  - Follow-up 10/07/2013:   He continues to have progressive symptoms of weakness, especially of the arms and legs.The muscle twitches have become less apparent.  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  - Follow-up 11/20/2013:  He went to the ED on 2/4 with fever of 103 and was treated for pneumonia/influenza. They also saw Dr. Vallarie Mare at [facility] for second opinion of ALS.  Symptoms are stable.  He does notice increased secretions at night and cannot sleep on his back.  He finds himself on the right side on two pillows.  Denies any problems with swallowing or talking.   Mood continues to be down and can be irritable at times. No recent falls.   Medications:  Current Outpatient Prescriptions on File Prior to Visit  Medication Sig Dispense Refill  . acetaminophen (TYLENOL) 500 MG tablet Take 500 mg by  mouth every 6 (six) hours as needed for fever.      Marland Kitchen alendronate (FOSAMAX) 70 MG tablet Take 70 mg by mouth once a week. Take with a full glass of water on an empty stomach. Takes on Tuesday      . chlorpheniramine (CHLOR-TRIMETON) 4 MG tablet Take 8 mg by mouth at bedtime as needed for allergies.       Marland Kitchen clopidogrel  (PLAVIX) 75 MG tablet Take 1 tablet (75 mg total) by mouth daily.  30 tablet  11  . famotidine (PEPCID) 20 MG tablet Take 20 mg by mouth daily.      . folic acid-pyridoxine-cyancobalamin (VIRT-VITE FORTE) 2.5-25-2 MG TABS Take 1 tablet by mouth 2 (two) times a week. Takes on days he takes methotrexate. (Saturday, Sunday)      . latanoprost (XALATAN) 0.005 % ophthalmic solution Place 1 drop into the left eye at bedtime.       Marland Kitchen levofloxacin (LEVAQUIN) 500 MG tablet Take 1 tablet (500 mg total) by mouth daily.  7 tablet  0  . methotrexate (RHEUMATREX) 2.5 MG tablet Take 10 mg by mouth once a week. Saturday and Sunday; Caution:Chemotherapy. Protect from light.      Marland Kitchen oseltamivir (TAMIFLU) 75 MG capsule Take 1 capsule (75 mg total) by mouth every 12 (twelve) hours.  10 capsule  0  . polyethylene glycol (MIRALAX / GLYCOLAX) packet Take 17 g by mouth daily as needed (for constipation).      . timolol (TIMOPTIC) 0.5 % ophthalmic solution Place 1 drop into the left eye daily.        No current facility-administered medications on file prior to visit.    Allergies:  Allergies  Allergen Reactions  . Dopamine Anaphylaxis  . Morphine And Related Other (See Comments)    Hallucinations, very bad reactions  . Vicodin [Hydrocodone-Acetaminophen] Nausea And Vomiting     Review of Systems:  CONSTITUTIONAL: No fevers, chills, night sweats, or weight loss.   EYES: No visual changes or eye pain ENT: No hearing changes.  No history of nose bleeds.   RESPIRATORY: No cough, wheezing and shortness of breath.   CARDIOVASCULAR: Negative for chest pain, and palpitations.   GI: Negative for abdominal discomfort, blood in stools or black stools.  No recent change in bowel habits.   GU:  No history of incontinence.   MUSCLOSKELETAL: No joint pain or swelling.  No myalgias.   SKIN: Negative for lesions, rash, and itching.   ENDOCRINE: Negative for cold or heat intolerance, polydipsia or goiter.   PSYCH:   +depression or anxiety symptoms.   NEURO: As Above.   Vital Signs:  BP 136/84  Pulse 88  Temp(Src) 97.6 F (36.4 C)  Wt 179 lb 2 oz (81.251 kg)  Neurological Exam: MENTAL STATUS including orientation to time, place, person, recent and remote memory, attention span and concentration, language, and fund of knowledge is normal.  Speech is not dysarthric.  CRANIAL NERVES: II:  No visual field defects on the left eye. 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  III-IV-VI: Left pupil is briskly reactive. Mildly disconjugate gaze with baseline right exotropia. Extraocular muscles of the left eye are intact.  V:  Normal facial sensation.  Jaw jerk is absent.   VII:  Subtle facial weakness of the frontalis, buccinator, orbicularis oculi, and orbicularis oris muscle (5-/5). Face is symmetric. Palomental reflex, snout, and Myerson's signs present.  IX-X:  Normal palatal movement.   XI:  Normal shoulder shrug and head rotation.  XII:  Normal tongue strength and range of motion, no deviation or fasciculations.  MOTOR:  Marked atrophy of the instrinsic hand muscles (ABP, ADM, FDI) and TA bilaterally. Rare fasciculations over the right FDI.  No pronator drift.  Tone is normal.    Right Upper Extremity:    Left Upper Extremity:    Deltoid  5/5   Deltoid  5/5   Biceps  5/5   Biceps  5/5   Triceps  5/5   Triceps  5/5   Wrist extensors  5/5   Wrist extensors  5/5   Wrist flexors  4+/5   Wrist flexors  4+/5   Finger extensors  5/5   Finger extensors  5/5   Finger flexors  5/5   Finger flexors  5/5   Dorsal interossei  4-/5   Dorsal interossei  4-/5   Abductor pollicis  4-/5   Abductor pollicis  4-/5   Tone (Ashworth scale)  0  Tone (Ashworth scale)  0   Right Lower Extremity:    Left Lower Extremity:    Hip flexors  5-/5   Hip flexors  5-/5   Hip extensors  5/5   Hip extensors  5/5   Knee flexors  5-/5   Knee flexors  5-/5   Knee extensors  5/5   Knee extensors  5/5   Dorsiflexors  2/5   Dorsiflexors  2/5    Plantarflexors  2/5   Plantarflexors  2/5   Toe extensors  0/5   Toe extensors  1/5   Toe flexors  1/5   Toe flexors  1/5   Tone (Ashworth scale)  0  Tone (Ashworth scale)  0   MSRs:  Right                                                                 Left brachioradialis 3+  brachioradialis 3+  biceps 3+  biceps 3+  triceps 3+  triceps 3+  Patellar* 4+  patellar 4+  ankle jerk 0  ankle jerk 0  Hoffman no  Hoffman no  plantar response down  plantar response up  Vertical spread with patella testing to the shoulders  SENSORY:  Vibration is absent at the the ankles only and intact at the kness. Light touch, pin prick, and temperature is intact. Romberg's sign prominently positive.   COORDINATION/GAIT: He is able to stand by pushing off the chair.  Gait is wide-based and unsteady when tested unassisted.   Data: MRI brain wwo 07/07/2013: ?tiny acute infarct right pons. Old right frontal infarct with encephalomalcia   MRI cervical spine wwo 07/07/2013: spondylotic changes C4-C5 and C5-C6 on the right with very mild right-sided cord flattening. Thyroid lesions can be assessed/ polyp with thyroid ultrasound.   MRI lumbar spine 05/31/2013: Spinal stenosis and degenerative changes most prominent L4-5 level   CT/A head and neck 48/54/6270: LICA siphon with calcified plaque, no stenosis. Mild cervical atherosclerosis. Right vertebral is patent, but very small in caliber. Extensive aortic arch and R subclavian artery plaque with ulcerated plaque in the arch. 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 There is note of bilateral upper lung nodules, up to 17 mm (no prior comparison study).   CSF 07/14/2013: R0 W0 G51 P31, IgG 2.4 CSF ACE, HSV,  cytology, VDRL, WNV, MBP, OCB - neg  CSF Lyme, WNV, echovirus - neg  Serum: Vitamin B12, vit B1, vit E, MMA, copper, ceruloplasmin, CK, CRP, RF, Lyme titers, heavy metal screen,  HbA1c, SPEP/UPEP with IFE, zinc, Neocomplete paraneoplastic panel - all normal  limits  *Abnormal:    Component     Latest Ref Rng 03/04/2013 05/22/2013 07/07/2013 07/31/2013  Sed Rate     0 - 22 mm/hr 42 (H) 29 (H) 52 (H) 43 (H)  Vitamin B6     2.1 - 21.7 ng/mL   51.7 (H) 41.5 (H)    EMG 07/01/2013: In summary, this is a complex NCS/EMG study with findings as follows:  1. 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 Overall, these changes are moderately severe in degree electrically.  2. Alternatively, multilevel intraspinal canal lesions (i.e. radiculopathy, anterior horn cell disorder) cannot be excluded and repeat EMG in 3-6 months may be indicated to evaluate to degree of ongoing axon motor loss.  3. Right ulnar neuropathy at the elbow, moderate in degree electrically.  EMG right side 09/28/2012:   1. Generalized large fiber sensorimotor polyneuropathy, predominately axon loss in type, affecting the right side. Overall, these changes are moderately severe in degree electrically.  2. There is evidence of a superimposed multilevel active on chronic intraspinal canal lesions affecting C6-T1 and L3-S1 roots/segments, which is at least moderate in degree electrically. These findings may result from lesions due to compression, among other etiologies, including degeneration of anterior horn cells. Given the selective progression of active and chronic motor axon loss, an evolving disorder or anterior horn cells, as seen in motor neuron disease is likely. Based on electrodiagnostic findings alone, the changes are insufficient for the definitive electrodiagnosis of amyotrophic lateral sclerosis.  3. Right ulnar neuropathy at the elbow, moderate in degree electrically. 4. Right non-localizable median neuropathy, mild-moderate in degree electrically.  Problem list - Severe neuropathy, ?related to RA - Distal atrophy and weakness - Possible R pontine stroke, likely small vessel vs  embolic from aortic arch calcification (06/2013).   - Ulcerated aortic arch plaque  - Continue plavix. He has not tolerated statins in the past  - Given that the left vertebral is dominant and filling the basilar, medical management recommended for hypoplastic R vertebral artery  - Rheumatoid arthritis  - on MTX $Remo'10mg'LRqkw$  per week   IMPRESSION: Mr. Baumler is a 73 year-old gentleman returning to the clinic for evaluation of distal-predominant weakness, atrophy, and falls. Clinically, his weakness seems to be stable. On exam, he has marked distal atrophy with diminished motor strength, brisk reflexes, pathological facial reflexes, and rare fasiculations.   His initial presentation was complex and work-up was extensive (imaging of the neuroaxis, CSF, and EMG).  Initial EMG was most consistent with a severe generalized sensorimotor PN, however multilevel intraspinal canal lesions (i.e. anterior horn cell disorder) is also possible.  Given the abrupt onset of symptoms and mild demyelinating features and active motor axon loss on EMG, a trial of prednisone for possible subacute inflammatory neuropathy was given, but there was no benefit. Repeat EMG performed on 1/5 showed diffuse active on chronic motor axon loss with active changes in previously unaffected muscles.  Given his sensory loss and MND, Kennedy's is another possibility, but this is lower motor neuron disease and he has combined UMN findings also, so would not fit.  No family history to support this either.  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  Diagnosis was discussed at his visit on 10/07/2013 and riluzole was offered, but declined by patient.  Second  opinion was sought at [facility] who also agree with probable ALS, though they were awaiting EMG report to review.  Vitamin supplements were given, but not started yet.    Clinically, his exam is stable.  At this time, management is symptomatic.  He has mild problems with oral secretions especially at night for which I will start amitriptyline 25mg  qhs.  This may also help with his depressed mood and paresthesias of his feet.  He denies any problems with swallowing, talking, or breathing.  He has completed PT for gait training.  Literature on fall precautions was given.  They are scheduled to be seen in multidisciplinary ALS clinic at [facility] in April.  I will see him back in 74-months or sooner as needed.   The duration of this appointment visit was 30 minutes of face-to-face time with the patient.  Greater than 50% of this time was spent in counseling, explanation of diagnosis, planning of further management, and coordination of care.   Thank you for allowing me to participate in patient's care.  If I can answer any additional questions, I would be pleased to do so.    Sincerely,    Marney Treloar K. Posey Pronto, DO

## 2013-11-20 NOTE — Patient Instructions (Addendum)
1.  Start taking amitriptyline 12.5 (one-half tablet) at bedtime for one week, then take one tablet at bedtime thereafter 2.  Start using exercise bands to stretch legs 3.  Return to clinic in 14-months  [facility]  Preventing Falls in the [place] are common, often dreaded events in the lives of older people. Aside from the obvious injuries and even death that may result, falls can cause wide-ranging consequences including loss of independence, mental decline, decreased activity, and mobility. Younger people are also at risk of falling, especially those with chronic illnesses and fatigue.  Ways to reduce the risk for falling:  * Examine diet and medications. Warm foods and alcohol dilate blood vessels, which can lead to dizziness when standing. Sleep aids, antidepressants, and pain medications can also increase the likelihood of a fall.  * Get a vison exam. Poor vision, cataracts, and glaucoma increase the chances of falling.  * Check foot gear. Shoes should fit snugly and have a sturdy, nonskid sole and broad, low heel.  * Participate in a physician-approved exercise program to build and maintain muscle strength and improve balance and coordination.  * Increase vitamin D intake. Vitamin D improves muscle strength and increases the amount of calcium the body is able to absorb and deposit in bones.  How to prevent falls from common hazards:  * Floors - Remove all loose wires, cords, and throw rugs. Minimize clutter. Make sure rugs are anchored and smooth. Keep furniture in its usual place.  * Chairs - Use chairs with straight backs, armrests, and firm seats. Add firm cushions to existing pieces to add height.  * Bathroom - Install grab bars and non-skid tape in the tub or shower. Use a bathtub transfer bench or a shower chair with a back support. Use an elevated toilet seat and/or safety rails to assist standing from a low surface. Do not use towel racks or bathroom tissue holders to help  you stand.  * Lighting - Make sure halls, stairways, and entrances are well-lit. Install a night light in your bathroom or hallway. Make sure there is a light switch at the top and bottom of the staircase. Turn lights on if you get up in the middle of the night. Make sure lamps or light switches are within reach of the bed if you have to get up during the night.  * Kitchen - Install non-skid rubber mats near the sink and stove. Clean spills immediately. Store frequently used utensils, pots, and pans between waist and eye level. This helps prevent reaching and bending. Sit when getting things out of the lower cupboards.  * Living room / [place] furniture with wide spaces in between, giving enough room to move around. Establish a route through the living room that gives you something to hold onto as you walk.  * Stairs - Make sure treads, rails, and rugs are secure. Install a rail on both sides of the stairs. If stairs are a threat, it might be helpful to arrange most of your activities on the lower level to reduce the number of times you must climb the stairs.  * Entrances and doorways - Install metal handles on the walls adjacent to the doorknobs of all doors to make it more secure as you travel through the doorway.  Tips for maintaining balance:  * Keep at least one hand free at all times Try using a backpack or fanny pack to hold things rather than carrying them in your  hands. Never carry objects in both hands when walking as this interferes with keeping your balance.  * Attempt to swing both arms from front to back while walking. This might require a conscious effort if Parkinson's disease has diminished your movement. It will, however, help you to maintain balance and posture, and reduce fatigue.  * Consciously lift your feet off the ground when walking. Shuffling and dragging of the feet is a common culprit in losing your balance.  * When trying to navigate turns, use a "U" technique of  facing forward and making a wide turn, rather than pivoting sharply.  * Try to stand with your feet shoulder-length apart. When your feet are close together for any length of time, you increase your risk of losing your balance and falling.  * Do one thing at a time. Do not try to walk and accomplish another task, such as reading or looking around. The decrease in your automatic reflexes complicates motor function, so the less distraction, the better.  * Do not wear rubber or gripping soled shoes, they might "catch" on the floor and cause tripping.  * Move slowly when changing positions. Use deliberate, concentrated movements and, if needed, use a grab bar or walking aid. Count fifteen (15) seconds after standing to begin walking.  * If balance is a continuous problem, you might want to consider a walking aid such as a cane, walking stick, or walker. Once you have mastered walking with help, you may be ready to try it again on your own.  This information is provided by [facility] and is not intended to replace the medical advice of your physician or other health care providers. Please consult your physician or other health care providers for advice regarding your specific medical condition.

## 2014-01-18 ENCOUNTER — Ambulatory Visit (INDEPENDENT_AMBULATORY_CARE_PROVIDER_SITE_OTHER): Payer: Medicare Other | Admitting: Internal Medicine

## 2014-01-18 ENCOUNTER — Encounter: Payer: Self-pay | Admitting: Internal Medicine

## 2014-01-18 VITALS — BP 160/90 | HR 83 | Temp 98.6°F | Resp 20 | Ht 71.0 in | Wt 183.0 lb

## 2014-01-18 DIAGNOSIS — M069 Rheumatoid arthritis, unspecified: Secondary | ICD-10-CM

## 2014-01-18 DIAGNOSIS — I10 Essential (primary) hypertension: Secondary | ICD-10-CM

## 2014-01-18 DIAGNOSIS — G609 Hereditary and idiopathic neuropathy, unspecified: Secondary | ICD-10-CM

## 2014-01-18 DIAGNOSIS — E785 Hyperlipidemia, unspecified: Secondary | ICD-10-CM

## 2014-01-18 DIAGNOSIS — G629 Polyneuropathy, unspecified: Secondary | ICD-10-CM

## 2014-01-18 DIAGNOSIS — G1221 Amyotrophic lateral sclerosis: Secondary | ICD-10-CM

## 2014-01-18 NOTE — Progress Notes (Signed)
Pre-visit discussion using our clinic review tool. No additional management support is needed unless otherwise documented below in the visit note.  

## 2014-01-18 NOTE — Patient Instructions (Signed)
Limit your sodium (Salt) intake  Fall prevention As outlined by neurology    Neurology follow up as scheduled  Please check your blood pressure on a regular basis.  If it is consistently greater than 150/90, please make an office appointment.

## 2014-01-18 NOTE — Progress Notes (Signed)
Subjective:    Patient ID: Brett Castaneda, male    DOB: 1941/09/18, 73 y.o.   MRN: [id number]  HPI  73 year old patient who has hypertension and dyslipidemia.  He is followed closely by neurology for a severe sensorimotor neuropathy, primarily affecting his distal lower extremities.  He feels there has been very little, if any progression over the past 3 months.  He is ambulatory with a walker. He is scheduled for neurology followup in about one month.  Past Medical History  Diagnosis Date  . BASAL CELL CARCINOMA, FACE 03/31/2009  . 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 CONSTIPATION 06/22/2008  . DIVERTICULOSIS, COLON 03/24/2007  . GERD 03/24/2007  . HYPERLIPIDEMIA 08/27/2007  . HYPERTENSION 12/14/2008  . Rheumatoid arthritis(714.0) 03/24/2007  . Blind right eye   . Retinal detachment     hx of  . Keratosis Oct. 2013  . Fall at home Nov. 5, 2014  06-28-13    in the home and Outside as well  . 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 Stroke Oct 2014  . 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 Marital Status: Married    Spouse Name: N/A    Number of Children: N/A  . Years of Education: N/A   Occupational History  . Not on file.   Social History Main Topics  . Smoking status: Former Smoker -- 23 years    Types: Cigarettes    Quit date: 09/24/1980  . Smokeless tobacco: Never Used  . Alcohol Use: No  . Drug Use: No  . Sexual Activity: Not on file   Other Topics Concern  . Not on file   Social History Narrative   Lives with wife of 83 years.  They have four children.  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     Past Surgical History  Procedure Laterality Date  . Total knee arthroplasty      bital  . Carpal tunnel release Right 2003  . Cataract extraction  1980    removed cataract in  left eye. Currently blind in right eye  . Cholecystectomy  2001    Gall Bladder  . Retinal detachment surgery  2004  . Lumbar puncture  Oct. 21, 2014  . Nerve conduction    . Mri  07-14-13  . Carotid endarterectomy Right 08-15-07    cea  . Joint replacement  2000    Right knee replacement  . Eye surgery  [id number]    retinal detachment  . Eye surgery      left cataract surgery  . Shoulder surgery Left 2009    hx "frozen" shoulder    Family History  Problem Relation Age of Onset  . Stroke Mother 83  . Heart disease Father   . Coronary artery disease Sister   . Heart disease Sister   . Diabetes Sister   . Hypertension Sister     Allergies  Allergen Reactions  . Dopamine Anaphylaxis  . Morphine And Related Other (See Comments)    Hallucinations, very bad reactions  . Vicodin [Hydrocodone-Acetaminophen] Nausea And Vomiting    Current Outpatient Prescriptions on File Prior to Visit  Medication Sig Dispense Refill  . acetaminophen (TYLENOL) 500 MG tablet Take 500 mg by mouth every 6 (six) hours as needed  for fever.      Marland Kitchen alendronate (FOSAMAX) 70 MG tablet Take 70 mg by mouth once a week. Take with a full glass of water on an empty stomach. Takes on Tuesday      . amitriptyline (ELAVIL) 25 MG tablet Take 1 tablet (25 mg total) by mouth at bedtime.  30 tablet  3  . chlorpheniramine (CHLOR-TRIMETON) 4 MG tablet Take 8 mg by mouth at bedtime as needed for allergies.       Marland Kitchen clopidogrel (PLAVIX) 75 MG tablet Take 1 tablet (75 mg total) by mouth daily.  30 tablet  11  . famotidine (PEPCID) 20 MG tablet Take 20 mg by mouth daily.      . folic acid-pyridoxine-cyancobalamin (VIRT-VITE FORTE) 2.5-25-2 MG TABS Take 1 tablet by mouth once a week. Takes on days he takes methotrexate. (Saturday, Sunday)      . latanoprost (XALATAN) 0.005 % ophthalmic solution Place 1 drop into the left eye at bedtime.       . methotrexate (RHEUMATREX) 2.5 MG tablet Take 10 mg by mouth once a week.  Saturday and Sunday; Caution:Chemotherapy. Protect from light.      . polyethylene glycol (MIRALAX / GLYCOLAX) packet Take 17 g by mouth daily as needed (for constipation).      . timolol (TIMOPTIC) 0.5 % ophthalmic solution Place 1 drop into the left eye daily.        No current facility-administered medications on file prior to visit.    BP 160/90  Pulse 83  Temp(Src) 98.6 F (37 C) (Oral)  Resp 20  Ht 5\' 11"  (1.803 m)  Wt 183 lb (83.008 kg)  BMI 25.53 kg/m2  SpO2 96%       Review of Systems  Constitutional: Negative for fever, chills, appetite change and fatigue.  HENT: Negative for congestion, dental problem, ear pain, hearing loss, sore throat, tinnitus, trouble swallowing and voice change.   Eyes: Negative for pain, discharge and visual disturbance.  Respiratory: Negative for cough, chest tightness, wheezing and stridor.   Cardiovascular: Negative for chest pain, palpitations and leg swelling.  Gastrointestinal: Negative for nausea, vomiting, abdominal pain, diarrhea, constipation, blood in stool and abdominal distention.  Genitourinary: Negative for urgency, hematuria, flank pain, discharge, difficulty urinating and genital sores.  Musculoskeletal: Positive for gait problem. Negative for arthralgias, back pain, joint swelling, myalgias and neck stiffness.  Skin: Negative for rash.  Neurological: Positive for weakness. Negative for dizziness, syncope, speech difficulty, numbness and headaches.  Hematological: Negative for adenopathy. Does not bruise/bleed easily.  Psychiatric/Behavioral: Negative for behavioral problems and dysphoric mood. The patient is not nervous/anxious.        Objective:   Physical Exam  Constitutional: He is oriented to person, place, and time. He appears well-developed.  Repeat blood pressure 140/84  HENT:  Head: Normocephalic.  Right Ear: External ear normal.  Left Ear: External ear normal.  Eyes: Conjunctivae and EOM are normal.  Neck:  Normal range of motion.  Cardiovascular: Normal rate and normal heart sounds.   Pulmonary/Chest: Breath sounds normal.  Abdominal: Bowel sounds are normal.  Musculoskeletal: Normal range of motion. He exhibits no edema and no tenderness.  No significant weakness with hip flexion Ankle plantar flexion and extension absent Patellar reflexes normal Achilles reflex is absent  Neurological: He is alert and oriented to person, place, and time.  Psychiatric: He has a normal mood and affect. His behavior is normal.          Assessment & Plan:  Hypertension.  We'll continue present regimen Dyslipidemia Neuropathy.  Followup neurology RA  Recheck here in 4 months or as needed

## 2014-01-18 NOTE — Progress Notes (Signed)
   Subjective:    Patient ID: Brett Castaneda, male    DOB: 12-06-1940, 73 y.o.   MRN: [id number]  HPI    Review of Systems     Objective:   Physical Exam        Assessment & Plan:

## 2014-02-23 ENCOUNTER — Ambulatory Visit: Payer: Self-pay | Admitting: Neurology

## 2014-03-03 ENCOUNTER — Emergency Department (HOSPITAL_COMMUNITY)
Admission: EM | Admit: 2014-03-03 | Discharge: 2014-03-03 | Disposition: A | Discharge status: Home / Self Care | Payer: Medicare Other | Attending: Emergency Medicine | Admitting: Emergency Medicine

## 2014-03-03 ENCOUNTER — Encounter (HOSPITAL_COMMUNITY): Payer: Self-pay | Admitting: Emergency Medicine

## 2014-03-03 DIAGNOSIS — R55 Syncope and collapse: Secondary | ICD-10-CM | POA: Insufficient documentation

## 2014-03-03 DIAGNOSIS — Z79899 Other long term (current) drug therapy: Secondary | ICD-10-CM | POA: Insufficient documentation

## 2014-03-03 DIAGNOSIS — Z872 Personal history of diseases of the skin and subcutaneous tissue: Secondary | ICD-10-CM | POA: Insufficient documentation

## 2014-03-03 DIAGNOSIS — E785 Hyperlipidemia, unspecified: Secondary | ICD-10-CM | POA: Insufficient documentation

## 2014-03-03 DIAGNOSIS — M069 Rheumatoid arthritis, unspecified: Secondary | ICD-10-CM | POA: Insufficient documentation

## 2014-03-03 DIAGNOSIS — I10 Essential (primary) hypertension: Secondary | ICD-10-CM | POA: Insufficient documentation

## 2014-03-03 DIAGNOSIS — K219 Gastro-esophageal reflux disease without esophagitis: Secondary | ICD-10-CM | POA: Insufficient documentation

## 2014-03-03 DIAGNOSIS — Z8673 Personal history of transient ischemic attack (TIA), and cerebral infarction without residual deficits: Secondary | ICD-10-CM | POA: Insufficient documentation

## 2014-03-03 DIAGNOSIS — Z87891 Personal history of nicotine dependence: Secondary | ICD-10-CM | POA: Insufficient documentation

## 2014-03-03 DIAGNOSIS — Z7902 Long term (current) use of antithrombotics/antiplatelets: Secondary | ICD-10-CM | POA: Insufficient documentation

## 2014-03-03 LAB — COMPREHENSIVE METABOLIC PANEL
ALT: 16 U/L (ref 0–53)
AST: 25 U/L (ref 0–37)
Albumin: 2.8 g/dL — ABNORMAL LOW (ref 3.5–5.2)
Alkaline Phosphatase: 78 U/L (ref 39–117)
BILIRUBIN TOTAL: 0.6 mg/dL (ref 0.3–1.2)
BUN: 14 mg/dL (ref 6–23)
CALCIUM: 8.5 mg/dL (ref 8.4–10.5)
CO2: 26 meq/L (ref 19–32)
Chloride: 102 mEq/L (ref 96–112)
Creatinine, Ser: 1.1 mg/dL (ref 0.50–1.35)
GFR, EST AFRICAN AMERICAN: 76 mL/min — AB (ref >90)
GFR, EST NON AFRICAN AMERICAN: 66 mL/min — AB (ref >90)
GLUCOSE: 133 mg/dL — AB (ref 70–99)
Potassium: 4.1 mEq/L (ref 3.7–5.3)
SODIUM: 138 meq/L (ref 137–147)
Total Protein: 7.2 g/dL (ref 6.0–8.3)

## 2014-03-03 LAB — CBC
HCT: 39 % (ref 39.0–52.0)
HEMOGLOBIN: 13.1 g/dL (ref 13.0–17.0)
MCH: 31.7 pg (ref 26.0–34.0)
MCHC: 33.6 g/dL (ref 30.0–36.0)
MCV: 94.4 fL (ref 78.0–100.0)
Platelets: 152 10*3/uL (ref 150–400)
RBC: 4.13 MIL/uL — AB (ref 4.22–5.81)
RDW: 13.4 % (ref 11.5–15.5)
WBC: 9.2 10*3/uL (ref 4.0–10.5)

## 2014-03-03 LAB — URINALYSIS, ROUTINE W REFLEX MICROSCOPIC
Bilirubin Urine: NEGATIVE
Glucose, UA: NEGATIVE mg/dL
Hgb urine dipstick: NEGATIVE
Ketones, ur: NEGATIVE mg/dL
Leukocytes, UA: NEGATIVE
NITRITE: NEGATIVE
PROTEIN: NEGATIVE mg/dL
SPECIFIC GRAVITY, URINE: 1.01 (ref 1.005–1.030)
Urobilinogen, UA: 0.2 mg/dL (ref 0.0–1.0)
pH: 6 (ref 5.0–8.0)

## 2014-03-03 LAB — POC OCCULT BLOOD, ED: Fecal Occult Bld: NEGATIVE

## 2014-03-03 MED ORDER — SODIUM CHLORIDE 0.9 % IV BOLUS (SEPSIS)
500.0000 mL | Freq: Once | INTRAVENOUS | Status: AC
  Administered 2014-03-03: 500 mL via INTRAVENOUS

## 2014-03-03 NOTE — ED Notes (Signed)
Bed: IO97 Expected date:  Expected time:  Means of arrival:  Comments: ems- near sycopal episode during bowel movement

## 2014-03-03 NOTE — ED Notes (Signed)
Pt to ED from home with EMS after syncopal event at home.  Per EMS report pt states abd pain and went into the bathroom.  Pt reports having normal BM and passed out afterwards.  Pt was clammy, pale and diaphoretic on EMS arrival.  Pt given NS 500 cc bolus.  Denies constipation or bloody stools.

## 2014-03-03 NOTE — ED Provider Notes (Signed)
CSN: [id number]     Arrival date & time 03/03/14  1243 History   First MD Initiated Contact with Patient 03/03/14 1308     Chief Complaint  Patient presents with  . Loss of Consciousness     (Consider location/radiation/quality/duration/timing/severity/associated sxs/prior Treatment) Patient is a 73 y.o. male presenting with syncope. The history is provided by the patient.  Loss of Consciousness Associated symptoms: no chest pain, no confusion, no fever, no headaches, no seizures, no shortness of breath, no vomiting and no weakness   pt presents after syncopal event at home just pta today.  Pt states felt in his baseline/well state of health earlier today. Was sitting at kitchen table, began to feel 'gas', abd cramping as if urgently needed to have bm. Got up quickly, went to bathroom and bm and urinated, stood up and felt faint/lightheaded and briefly passed out, spouse was w pt, so he did not fall or hit head. .  Denies injury or pain from event. No headache. No neck or back pain. Denies any current or preceding chest pain or discomfort. No unusual doe or sob. No palpitations or sense of fast or irregular heart beat. Denies vomiting or diarrhea. Stools normal color, no melena or hematochezia. No dysuria or change in odor or appearance of urine. No cough or uri c/o. No fever or chills. No recent change in medications. Denies other hx syncope or hx dysrhythmia.     Past Medical History  Diagnosis Date  . BASAL CELL CARCINOMA, FACE 03/31/2009  . 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 CONSTIPATION 06/22/2008  . DIVERTICULOSIS, COLON 03/24/2007  . GERD 03/24/2007  . HYPERLIPIDEMIA 08/27/2007  . HYPERTENSION 12/14/2008  . Rheumatoid arthritis(714.0) 03/24/2007  . Blind right eye   . Retinal detachment     hx of  . Keratosis Oct. 2013  . Fall at home Nov. 5, 2014  06-28-13    in the home and Outside as well  . Carotid artery occlusion 08-15-07    Right CEA   . Stroke Oct 2014  . 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 Total knee arthroplasty      bital  . Carpal tunnel release Right 2003  . Cataract extraction  1980    removed cataract in left eye. Currently blind in right eye  . Cholecystectomy  2001    Gall Bladder  . Retinal detachment surgery  2004  . Lumbar puncture  Oct. 21, 2014  . Nerve conduction    . Mri  07-14-13  . Carotid endarterectomy Right 08-15-07    cea  . Joint replacement  2000    Right knee replacement  . Eye surgery  0102,7253    retinal detachment  . Eye surgery      left cataract surgery  . Shoulder surgery Left 2009    hx "frozen" shoulder   Family History  Problem Relation Age of Onset  . Stroke Mother 26  . Heart disease Father   . Coronary artery disease Sister   . Heart disease Sister   . Diabetes Sister   . Hypertension Sister    History  Substance Use Topics  . Smoking status: Former Smoker -- 23 years    Types: Cigarettes    Quit date: 09/24/1980  . Smokeless tobacco: Never Used  . Alcohol Use: No  Review of Systems  Constitutional: Negative for fever.  HENT: Negative for sore throat.   Eyes: Negative for redness.  Respiratory: Negative for shortness of breath.   Cardiovascular: Positive for syncope. Negative for chest pain.  Gastrointestinal: Negative for vomiting, abdominal pain, diarrhea and blood in stool.  Genitourinary: Negative for dysuria and flank pain.  Musculoskeletal: Negative for back pain and neck pain.  Skin: Negative for rash.  Neurological: Negative for seizures, weakness, numbness and headaches.  Hematological: Does not bruise/bleed easily.  Psychiatric/Behavioral: Negative for confusion.      Allergies  Dopamine; Morphine and related; and Vicodin  Home Medications   Prior to Admission medications   Medication Sig Start Date  End Date Taking? Authorizing Provider  acetaminophen (TYLENOL) 500 MG tablet Take 500 mg by mouth every 6 (six) hours as needed for fever.   Yes Historical Provider, MD  alendronate (FOSAMAX) 70 MG tablet Take 70 mg by mouth once a week. Take with a full glass of water on an empty stomach. Takes on Wednesday   Yes Historical Provider, MD  amitriptyline (ELAVIL) 25 MG tablet Take 1 tablet (25 mg total) by mouth at bedtime. 11/20/13  Yes Alda Berthold, MD  chlorpheniramine (CHLOR-TRIMETON) 4 MG tablet Take 8 mg by mouth at bedtime as needed for allergies.    Yes Historical Provider, MD  clopidogrel (PLAVIX) 75 MG tablet Take 1 tablet (75 mg total) by mouth daily. 08/03/13  Yes Alda Berthold, MD  famotidine (PEPCID) 20 MG tablet Take 20 mg by mouth daily.   Yes Historical Provider, MD  folic acid-pyridoxine-cyancobalamin (VIRT-VITE FORTE) 2.5-25-2 MG TABS Take 1 tablet by mouth once a week. Takes on days he takes methotrexate. (thursday)   Yes Historical Provider, MD  latanoprost (XALATAN) 0.005 % ophthalmic solution Place 1 drop into the left eye at bedtime.    Yes Historical Provider, MD  methotrexate (RHEUMATREX) 2.5 MG tablet Take 10 mg by mouth once a week. Caution:Chemotherapy. Protect from light.   Yes Historical Provider, MD  Olopatadine HCl (PATADAY) 0.2 % SOLN Apply 1 drop to eye as needed (allergies).   Yes Historical Provider, MD  polyethylene glycol (MIRALAX / GLYCOLAX) packet Take 17 g by mouth daily as needed (for constipation).   Yes Historical Provider, MD  Probiotic Product (PROBIOTIC DAILY PO) Take 1 tablet by mouth daily.   Yes Historical Provider, MD  timolol (TIMOPTIC) 0.5 % ophthalmic solution Place 1 drop into the left eye every evening.    Yes Historical Provider, MD   BP 127/77  Pulse 63  Temp(Src) 97.9 F (36.6 C) (Oral)  Resp 11  SpO2 98% Physical Exam  Nursing note and vitals reviewed. Constitutional: He is oriented to person, place, and time. He appears  well-developed and well-nourished. No distress.  HENT:  Head: Atraumatic.  Mouth/Throat: Oropharynx is clear and moist.  Eyes: Conjunctivae are normal.  Blind right eye/chronic. Left pupil round, reactive.   Neck: Neck supple. No tracheal deviation present.  No bruit  Cardiovascular: Normal rate, regular rhythm, normal heart sounds and intact distal pulses.  Exam reveals no gallop and no friction rub.   No murmur heard. Pulmonary/Chest: Effort normal and breath sounds normal. No accessory muscle usage. No respiratory distress. He exhibits no tenderness.  Abdominal: Soft. Bowel sounds are normal. He exhibits no distension and no mass. There is no tenderness. There is no rebound and no guarding.  No puls mass.   Genitourinary:  No cva tenderness. Light brown stool, heme neg.  Musculoskeletal: Normal range of motion. He exhibits no edema and no tenderness.  CTLS spine, non tender, aligned, no step off. Good rom bil ext without pain or focal bony tenderness.   Neurological: He is alert and oriented to person, place, and time.  Motor intact bil. Steady gait.   Skin: Skin is warm and dry.  Psychiatric: He has a normal mood and affect.    ED Course  Procedures (including critical care time) Labs Review  Results for orders placed during the hospital encounter of 03/03/14  CBC      Result Value Ref Range   WBC 9.2  4.0 - 10.5 K/uL   RBC 4.13 (*) 4.22 - 5.81 MIL/uL   Hemoglobin 13.1  13.0 - 17.0 g/dL   HCT 39.0  39.0 - 52.0 %   MCV 94.4  78.0 - 100.0 fL   MCH 31.7  26.0 - 34.0 pg   MCHC 33.6  30.0 - 36.0 g/dL   RDW 13.4  11.5 - 15.5 %   Platelets 152  150 - 400 K/uL  COMPREHENSIVE METABOLIC PANEL      Result Value Ref Range   Sodium 138  137 - 147 mEq/L   Potassium 4.1  3.7 - 5.3 mEq/L   Chloride 102  96 - 112 mEq/L   CO2 26  19 - 32 mEq/L   Glucose, Bld 133 (*) 70 - 99 mg/dL   BUN 14  6 - 23 mg/dL   Creatinine, Ser 1.10  0.50 - 1.35 mg/dL   Calcium 8.5  8.4 - 10.5 mg/dL    Total Protein 7.2  6.0 - 8.3 g/dL   Albumin 2.8 (*) 3.5 - 5.2 g/dL   AST 25  0 - 37 U/L   ALT 16  0 - 53 U/L   Alkaline Phosphatase 78  39 - 117 U/L   Total Bilirubin 0.6  0.3 - 1.2 mg/dL   GFR calc non Af Amer 66 (*) >90 mL/min   GFR calc Af Amer 76 (*) >90 mL/min  URINALYSIS, ROUTINE W REFLEX MICROSCOPIC      Result Value Ref Range   Color, Urine YELLOW  YELLOW   APPearance CLEAR  CLEAR   Specific Gravity, Urine 1.010  1.005 - 1.030   pH 6.0  5.0 - 8.0   Glucose, UA NEGATIVE  NEGATIVE mg/dL   Hgb urine dipstick NEGATIVE  NEGATIVE   Bilirubin Urine NEGATIVE  NEGATIVE   Ketones, ur NEGATIVE  NEGATIVE mg/dL   Protein, ur NEGATIVE  NEGATIVE mg/dL   Urobilinogen, UA 0.2  0.0 - 1.0 mg/dL   Nitrite NEGATIVE  NEGATIVE   Leukocytes, UA NEGATIVE  NEGATIVE  POC OCCULT BLOOD, ED      Result Value Ref Range   Fecal Occult Bld NEGATIVE  NEGATIVE       EKG Interpretation   Date/Time:  Wednesday March 03 2014 13:40:46 EDT Ventricular Rate:  63 PR Interval:  157 QRS Duration: 102 QT Interval:  408 QTC Calculation: 418 R Axis:   3 Text Interpretation:  Sinus rhythm Normal ECG No significant change since  last tracing Confirmed by Amay Mijangos  MD, [name] ([id number]) on 03/03/2014 1:57:13  PM      MDM  Iv ns bolus. Monitor. Labs.  Reviewed nursing notes and prior charts for additional history.   Remains in nsr on monitor. No dysrhythmia.  Recheck pt, no pain or discomfort of any sort. No fever or chills. abd soft nt.  Pt ambulatory to bathroom x  2, no faintness or dizziness.  Pt states feels ready for d/c, asymptomatic.     Mirna Mires, MD 03/03/14 [phone]

## 2014-03-03 NOTE — Discharge Instructions (Signed)
Rest. Drink plenty of fluids. Follow up with primary care doctor in coming week. Return to ER if worse, symptoms recur, weak/faint, fevers, trouble breathing, abdominal pain, vomiting, other concern.     Near-Syncope Near-syncope (commonly known as near fainting) is sudden weakness, dizziness, or feeling like you might pass out. During an episode of near-syncope, you may also develop pale skin, have tunnel vision, or feel sick to your stomach (nauseous). Near-syncope may occur when getting up after sitting or while standing for a long time. It is caused by a sudden decrease in blood flow to the brain. This decrease can result from various causes or triggers, most of which are not serious. However, because near-syncope can sometimes be a sign of something serious, a medical evaluation is required. The specific cause is often not determined. HOME CARE INSTRUCTIONS  Monitor your condition for any changes. The following actions may help to alleviate any discomfort you are experiencing:  Have someone stay with you until you feel stable.  Lie down right away if you start feeling like you might faint. Breathe deeply and steadily. Wait until all the symptoms have passed. Most of these episodes last only a few minutes. You may feel tired for several hours.   Drink enough fluids to keep your urine clear or pale yellow.   If you are taking blood pressure or heart medicine, get up slowly when seated or lying down. Take several minutes to sit and then stand. This can reduce dizziness.  Follow up with your health care provider as directed. SEEK IMMEDIATE MEDICAL CARE IF:   You have a severe headache.   You have unusual pain in the chest, abdomen, or back.   You are bleeding from the mouth or rectum, or you have black or tarry stool.   You have an irregular or very fast heartbeat.   You have repeated fainting or have seizure-like jerking during an episode.   You faint when sitting or lying  down.   You have confusion.   You have difficulty walking.   You have severe weakness.   You have vision problems.  MAKE SURE YOU:   Understand these instructions.  Will watch your condition.  Will get help right away if you are not doing well or get worse. Document Released: 09/10/2005 Document Revised: 05/13/2013 Document Reviewed: 02/13/2013 [facility] Patient Information 2014 [place].   Syncope Syncope is a fainting spell. This means the person loses consciousness and drops to the ground. The person is generally unconscious for less than 5 minutes. The person may have some muscle twitches for up to 15 seconds before waking up and returning to normal. Syncope occurs more often in elderly people, but it can happen to anyone. While most causes of syncope are not dangerous, syncope can be a sign of a serious medical problem. It is important to seek medical care.  CAUSES  Syncope is caused by a sudden decrease in blood flow to the brain. The specific cause is often not determined. Factors that can trigger syncope include:  Taking medicines that lower blood pressure.  Sudden changes in posture, such as standing up suddenly.  Taking more medicine than prescribed.  Standing in one place for too long.  Seizure disorders.  Dehydration and excessive exposure to heat.  Low blood sugar (hypoglycemia).  Straining to have a bowel movement.  Heart disease, irregular heartbeat, or other circulatory problems.  Fear, emotional distress, seeing blood, or severe pain. SYMPTOMS  Right before fainting, you may:  Feel dizzy or lightheaded.  Feel nauseous.  See all white or all black in your field of vision.  Have cold, clammy skin. DIAGNOSIS  Your caregiver will ask about your symptoms, perform a physical exam, and perform electrocardiography (ECG) to record the electrical activity of your heart. Your caregiver may also perform other heart or blood tests to determine the  cause of your syncope. TREATMENT  In most cases, no treatment is needed. Depending on the cause of your syncope, your caregiver may recommend changing or stopping some of your medicines. HOME CARE INSTRUCTIONS  Have someone stay with you until you feel stable.  Do not drive, operate machinery, or play sports until your caregiver says it is okay.  Keep all follow-up appointments as directed by your caregiver.  Lie down right away if you start feeling like you might faint. Breathe deeply and steadily. Wait until all the symptoms have passed.  Drink enough fluids to keep your urine clear or pale yellow.  If you are taking blood pressure or heart medicine, get up slowly, taking several minutes to sit and then stand. This can reduce dizziness. SEEK IMMEDIATE MEDICAL CARE IF:   You have a severe headache.  You have unusual pain in the chest, abdomen, or back.  You are bleeding from the mouth or rectum, or you have black or tarry stool.  You have an irregular or very fast heartbeat.  You have pain with breathing.  You have repeated fainting or seizure-like jerking during an episode.  You faint when sitting or lying down.  You have confusion.  You have difficulty walking.  You have severe weakness.  You have vision problems. If you fainted, call your local emergency services (911 in [place] Do not drive yourself to the hospital.  MAKE SURE YOU:  Understand these instructions.  Will watch your condition.  Will get help right away if you are not doing well or get worse. Document Released: 09/10/2005 Document Revised: 03/11/2012 Document Reviewed: 11/09/2011 [facility] Patient Information 2014 [place].

## 2014-03-03 NOTE — ED Notes (Signed)
Pt sts he sometimes uses a walker while ambulating at home, but was able to ambulate with assistance to the bathroom and back to his room. Once in the bathroom, pt sts he was unable to do anything.

## 2014-03-05 ENCOUNTER — Encounter: Payer: Self-pay | Admitting: Neurology

## 2014-03-05 ENCOUNTER — Ambulatory Visit (INDEPENDENT_AMBULATORY_CARE_PROVIDER_SITE_OTHER): Payer: Medicare Other | Admitting: Neurology

## 2014-03-05 VITALS — BP 150/90 | HR 77 | Wt 183.5 lb

## 2014-03-05 DIAGNOSIS — G608 Other hereditary and idiopathic neuropathies: Secondary | ICD-10-CM

## 2014-03-05 DIAGNOSIS — G1221 Amyotrophic lateral sclerosis: Secondary | ICD-10-CM

## 2014-03-05 NOTE — Patient Instructions (Signed)
1.  Continue amitriptyline 25mg  at bedtime 2.  Continue your home exercises.  If you would like to move forward with ankle foot orthotics (AFO), please let our office know 3.  Return to clinic in 3 months

## 2014-03-05 NOTE — Progress Notes (Signed)
[facility]  Follow-up Visit   Date: 03/05/2014    Brett Castaneda MRN: [id number] DOB: 1941-04-24   Interim History: [name] is a 73 y.o. 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  He was last seen in the clinic on 11/20/2013.  The patient was accompanied to the clinic by wife who provides additional history.  History of present illness: 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 However, during the summer 2014, he started developing bilateral leg weakness and walking problems. His wife recalls that he became acutely ill on 04/08/2013 because she was going with her husband to get her driver's license, he started feeling nauseated and sick. When she drove them home, he was took weak to get out of the car so she drove him to the Emergency Department. He was complaining of chest pain, shortness of breath, and back pain. Work-up was suggestive of community-acquired pneumonia with mild leukocytosis, EKG and cardiac enzymes were unremarkable and he was started on appropriate antibiotics and discharged home.  Since his discharge, he had worsening problems with walking and falls, some of which were severe falls resulting in facial contusions and breaking his eye glasses. Of note, in May 2014, he had a tick bite on his leg and a the following month, his weakness started.  Lyme titers are negative.  - Follow-up 07/31/2013:  He has had extensive work-up from his initial visit including MRI neuroaxis, CSF and laboratory testing (see below).  MRI brain shows subacute possible right pontine stroke.  No evidence of CSF infection/inflammatory.  Serum labs  indicate mild elevation in ESR and vitamin B6.  Recommended patient to stop B complex supplements.  Clinically, he continued to have progressive weakness of arms and legs and became more dependent on his wife for nearly all ADLs. 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   - Follow-up 08/17/2013:  No change with trial of prednisone, so it was gradually tapered off (stopped in December 2014).  New twitches noticed over the calf and arms.  Completed PT but it was stopped due to no improvement in symptoms.  - Follow-up 10/07/2013:   He continues to have progressive symptoms of weakness, especially of the arms and legs.The muscle twitches have become less apparent.  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  - Follow-up 11/20/2013:  He went to the ED on 2/4 with fever of 103 and was treated for pneumonia/influenza. They also saw Dr. Vallarie Mare at [facility] for second opinion of ALS who agreed with evaluation.  Symptoms are stable.  He does notice increased secretions at night and cannot sleep on his back.  He finds himself on the right side on two pillows.    - Follow-up 03/05/2014:  He had vasovagal syncopal event two days ago while defecating.  Denies any problems with swallowing, talking, muscle twitches, or muscle cramps.  No shortness of breath.  Weakness overall seems to have stabilized.  He has fallen 4 falls since his last  visit, mostly tripping, but was able to stand up himself.      Medications:  Current Outpatient Prescriptions on File Prior to Visit  Medication Sig Dispense Refill  . acetaminophen (TYLENOL) 500 MG tablet Take 500 mg by mouth every 6 (six) hours as needed for fever.      Marland Kitchen alendronate (FOSAMAX) 70 MG tablet Take 70 mg by mouth once a  week. Take with a full glass of water on an empty stomach. Takes on Wednesday      . amitriptyline (ELAVIL) 25 MG tablet Take 1 tablet (25 mg total) by mouth at bedtime.  30 tablet  3  . chlorpheniramine (CHLOR-TRIMETON) 4 MG tablet Take 8 mg by mouth at bedtime as needed for allergies.       Marland Kitchen clopidogrel (PLAVIX) 75 MG tablet Take 1 tablet (75 mg total) by mouth daily.  30 tablet  11  . famotidine (PEPCID) 20 MG tablet Take 20 mg by mouth daily.      . folic acid-pyridoxine-cyancobalamin (VIRT-VITE FORTE) 2.5-25-2 MG TABS Take 1 tablet by mouth once a week. Takes on days he takes methotrexate. (thursday)      . latanoprost (XALATAN) 0.005 % ophthalmic solution Place 1 drop into the left eye at bedtime.       . methotrexate (RHEUMATREX) 2.5 MG tablet Take 10 mg by mouth once a week. Caution:Chemotherapy. Protect from light.      . Olopatadine HCl (PATADAY) 0.2 % SOLN Apply 1 drop to eye as needed (allergies).      . polyethylene glycol (MIRALAX / GLYCOLAX) packet Take 17 g by mouth daily as needed (for constipation).      . Probiotic Product (PROBIOTIC DAILY PO) Take 1 tablet by mouth daily.      . timolol (TIMOPTIC) 0.5 % ophthalmic solution Place 1 drop into the left eye every evening.        No current facility-administered medications on file prior to visit.    Allergies:  Allergies  Allergen Reactions  . Dopamine Anaphylaxis  . Morphine And Related Other (See Comments)    Hallucinations, very bad reactions  . Vicodin [Hydrocodone-Acetaminophen] Nausea And Vomiting     Review of Systems:  CONSTITUTIONAL: No fevers, chills, night sweats, or weight loss.   EYES: No visual changes or eye pain ENT: No hearing changes.  No history of nose bleeds.   RESPIRATORY: No cough, wheezing and shortness of breath.   CARDIOVASCULAR: Negative for chest pain, and palpitations.   GI: Negative for abdominal discomfort, blood in stools or black stools.  No recent change in bowel habits.   GU:  No  history of incontinence.   MUSCLOSKELETAL: No joint pain or swelling.  No myalgias.   SKIN: Negative for lesions, rash, and itching.   ENDOCRINE: Negative for cold or heat intolerance, polydipsia or goiter.   PSYCH:  +depression or anxiety symptoms.   NEURO: As Above.   Vital Signs:  BP 150/90  Pulse 77  Wt 183 lb 8 oz (83.235 kg)  SpO2 97%  Neurological Exam: MENTAL STATUS including orientation to time, place, person, recent and remote memory, attention span and concentration, language, and fund of knowledge is normal.  Speech is not dysarthric.  CRANIAL NERVES:  No visual field defects on the left eye. 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 Left pupil is briskly reactive. Mildly disconjugate gaze with baseline right exotropia. Extraocular muscles of the left eye are intact.  Subtle facial weakness of the frontalis, buccinator, orbicularis oculi, and  orbicularis oris muscle (5-/5). Face is symmetric. Palomental reflex, snout, and Myerson's signs present.  Palate elevates symmetrically.  Tongue is midline and does not show any fasciculations  MOTOR:  Marked atrophy of the instrinsic hand muscles (ABP, ADM, FDI) and TA bilaterally. No fasciculations seen. Tone is normal.    Right Upper Extremity:    Left Upper Extremity:    Deltoid  5/5   Deltoid  5/5   Biceps  5/5   Biceps  5/5   Triceps  5/5   Triceps  5/5   Wrist extensors  5/5   Wrist extensors  5/5   Wrist flexors  5/5   Wrist flexors  5/5   Finger extensors  5/5   Finger extensors  5/5   Finger flexors  5/5   Finger flexors  5/5   Dorsal interossei  4-/5   Dorsal interossei  4-/5   Abductor pollicis  4-/5   Abductor pollicis  4-/5   Tone (Ashworth scale)  0  Tone (Ashworth scale)  0   Right Lower Extremity:    Left Lower Extremity:    Hip flexors  5-/5   Hip flexors  5/5   Hip extensors  5/5   Hip extensors  5/5   Knee flexors  5/5   Knee flexors  5/5   Knee extensors  5/5   Knee extensors  5/5   Dorsiflexors  2/5   Dorsiflexors  2/5    Plantarflexors  2/5   Plantarflexors  2/5   Toe extensors  0/5   Toe extensors  1/5   Toe flexors  1/5   Toe flexors  1/5   Tone (Ashworth scale)  0  Tone (Ashworth scale)  0   MSRs:  Right                                                                 Left brachioradialis 3+  brachioradialis 3+  biceps 3+  biceps 3+  triceps 3+  triceps 3+  Patellar* 3+  patellar 3+  ankle jerk 0  ankle jerk 0  Hoffman no  Hoffman no  plantar response down  plantar response up  Vertical spread with patella testing to the shoulders  SENSORY:  Vibration is absent at the the ankles only and intact at the kness. Light touch, pin prick, and temperature is intact.   COORDINATION/GAIT: He is able to stand by pushing off the chair.  Gait is wide-based and unsteady when tested unassisted.   Data: MRI brain wwo 07/07/2013: ?tiny acute infarct right pons. Old right frontal infarct with encephalomalcia   MRI cervical spine wwo 07/07/2013: spondylotic changes C4-C5 and C5-C6 on the right with very mild right-sided cord flattening. Thyroid lesions can be assessed/ polyp with thyroid ultrasound.   MRI lumbar spine 05/31/2013: Spinal stenosis and degenerative changes most prominent L4-5 level   CT/A head and neck 27/25/3664: LICA siphon with calcified plaque, no stenosis. Mild cervical atherosclerosis. Right vertebral is patent, but very small in caliber. Extensive aortic arch and R subclavian artery plaque with ulcerated plaque in the arch. 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 There is note of bilateral upper lung nodules, up to 17 mm (no prior comparison study).   CSF 07/14/2013: R0 W0 G51 P31, IgG 2.4  CSF ACE, HSV, cytology, VDRL, WNV, MBP, OCB - neg  CSF Lyme, WNV, echovirus - neg  Serum: Vitamin B12, vit B1, vit E, MMA, copper, ceruloplasmin, CK, CRP, RF, Lyme titers, heavy metal screen,  HbA1c, SPEP/UPEP with IFE, zinc, Neocomplete paraneoplastic panel - all normal limits  *Abnormal:    Component      Latest Ref Rng 03/04/2013 05/22/2013 07/07/2013 07/31/2013  Sed Rate     0 - 22 mm/hr 42 (H) 29 (H) 52 (H) 43 (H)  Vitamin B6     2.1 - 21.7 ng/mL   51.7 (H) 41.5 (H)    EMG 07/01/2013: In summary, this is a complex NCS/EMG study with findings as follows:  1. 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 Overall, these changes are moderately severe in degree electrically.  2. Alternatively, multilevel intraspinal canal lesions (i.e. radiculopathy, anterior horn cell disorder) cannot be excluded and repeat EMG in 3-6 months may be indicated to evaluate to degree of ongoing axon motor loss.  3. Right ulnar neuropathy at the elbow, moderate in degree electrically.  EMG right side 09/28/2012:   1. Generalized large fiber sensorimotor polyneuropathy, predominately axon loss in type, affecting the right side. Overall, these changes are moderately severe in degree electrically.  2. There is evidence of a superimposed multilevel active on chronic intraspinal canal lesions affecting C6-T1 and L3-S1 roots/segments, which is at least moderate in degree electrically. These findings may result from lesions due to compression, among other etiologies, including degeneration of anterior horn cells. Given the selective progression of active and chronic motor axon loss, an evolving disorder or anterior horn cells, as seen in motor neuron disease is likely. Based on electrodiagnostic findings alone, the changes are insufficient for the definitive electrodiagnosis of amyotrophic lateral sclerosis.  3. Right ulnar neuropathy at the elbow, moderate in degree electrically. 4. Right non-localizable median neuropathy, mild-moderate in degree electrically.  Problem list - Severe neuropathy, secondary to RA - Distal atrophy and weakness - Possible R pontine stroke, likely small vessel vs embolic from aortic arch  calcification (06/2013).   - Ulcerated aortic arch plaque  - Continue plavix. He has not tolerated statins in the past  - Given that the left vertebral is dominant and filling the basilar, medical management recommended for hypoplastic R vertebral artery  - Rheumatoid arthritis  - on MTX $Remo'10mg'XdZGs$  per week   IMPRESSION: Mr. Koppel is a 73 year-old gentleman returning to the clinic for evaluation of distal-predominant weakness, atrophy, and falls. On exam, he has marked distal atrophy with diminished motor strength, brisk reflexes, and pathological facial reflexes.  Today, there is subtle improvement in motor strength involving wrist flexion and knee flexion.   His initial presentation was complex and work-up was extensive (imaging of the neuroaxis, CSF, and EMG).  Initial EMG was most consistent with a severe generalized sensorimotor PN, however multilevel intraspinal canal lesions (i.e. anterior horn cell disorder) is also possible.  Given the abrupt onset of symptoms and mild demyelinating features and active motor axon loss on EMG, a trial of prednisone for possible subacute inflammatory neuropathy was given, but there was no benefit. Repeat EMG performed on 09/28/2013 showed diffuse active on chronic motor axon loss with active changes in previously unaffected muscles.  Given his sensory loss and MND, Kennedy's is another possibility, but this is lower motor neuron disease and he has combined UMN findings also, so would not fit.  No family history to support this either.  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  Diagnosis was discussed at his visit on 10/07/2013 and riluzole was offered, but declined by patient.  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    Today, he is doing slightly better and his exam remains stable, if not slightly improved.  At this time, management is symptomatic.  This is still a perplexing case given overlap of severe neuropathy (distal predominant) and possible MND.  Will continue to follow clinically and see how symptoms evolve over time.  Continue amitriptyline 25mg  qhs which is helping with oral secretions Discussed AFO for foot drop, but patient not interested.  Discussed fall precautions at length.  I will see him back in 37-months or sooner as needed.     The duration of this appointment visit was 30 minutes of face-to-face time with the patient.  Greater than 50% of this time was spent in counseling, explanation of diagnosis, planning of further management, and coordination of care.   Thank you for allowing me to participate in patient's care.  If I can answer any additional questions, I would be pleased to do so.    Sincerely,    Donika K. Posey Pronto, DO

## 2014-05-17 ENCOUNTER — Ambulatory Visit (INDEPENDENT_AMBULATORY_CARE_PROVIDER_SITE_OTHER): Payer: Medicare Other | Admitting: Internal Medicine

## 2014-05-17 ENCOUNTER — Encounter: Payer: Self-pay | Admitting: Internal Medicine

## 2014-05-17 VITALS — BP 140/88 | HR 73 | Temp 98.2°F | Resp 20 | Ht 71.0 in | Wt 184.0 lb

## 2014-05-17 DIAGNOSIS — E785 Hyperlipidemia, unspecified: Secondary | ICD-10-CM

## 2014-05-17 DIAGNOSIS — G629 Polyneuropathy, unspecified: Secondary | ICD-10-CM

## 2014-05-17 DIAGNOSIS — M0510 Rheumatoid lung disease with rheumatoid arthritis of unspecified site: Secondary | ICD-10-CM

## 2014-05-17 DIAGNOSIS — G1221 Amyotrophic lateral sclerosis: Secondary | ICD-10-CM

## 2014-05-17 DIAGNOSIS — I10 Essential (primary) hypertension: Secondary | ICD-10-CM

## 2014-05-17 DIAGNOSIS — G609 Hereditary and idiopathic neuropathy, unspecified: Secondary | ICD-10-CM

## 2014-05-17 DIAGNOSIS — R29898 Other symptoms and signs involving the musculoskeletal system: Secondary | ICD-10-CM

## 2014-05-17 DIAGNOSIS — M069 Rheumatoid arthritis, unspecified: Secondary | ICD-10-CM

## 2014-05-17 LAB — CBC WITH DIFFERENTIAL/PLATELET
BASOS PCT: 0.4 % (ref 0.0–3.0)
Basophils Absolute: 0 10*3/uL (ref 0.0–0.1)
Eosinophils Absolute: 0.5 10*3/uL (ref 0.0–0.7)
Eosinophils Relative: 6.5 % — ABNORMAL HIGH (ref 0.0–5.0)
HEMATOCRIT: 39.6 % (ref 39.0–52.0)
Hemoglobin: 13.1 g/dL (ref 13.0–17.0)
LYMPHS ABS: 2.6 10*3/uL (ref 0.7–4.0)
Lymphocytes Relative: 34.3 % (ref 12.0–46.0)
MCHC: 33.1 g/dL (ref 30.0–36.0)
MCV: 97.3 fl (ref 78.0–100.0)
MONO ABS: 0.9 10*3/uL (ref 0.1–1.0)
Monocytes Relative: 11.7 % (ref 3.0–12.0)
NEUTROS PCT: 47.1 % (ref 43.0–77.0)
Neutro Abs: 3.6 10*3/uL (ref 1.4–7.7)
Platelets: 207 10*3/uL (ref 150.0–400.0)
RBC: 4.07 Mil/uL — AB (ref 4.22–5.81)
RDW: 13.9 % (ref 11.5–15.5)
WBC: 7.5 10*3/uL (ref 4.0–10.5)

## 2014-05-17 LAB — COMPREHENSIVE METABOLIC PANEL
ALT: 20 U/L (ref 0–53)
AST: 29 U/L (ref 0–37)
Albumin: 3.2 g/dL — ABNORMAL LOW (ref 3.5–5.2)
Alkaline Phosphatase: 60 U/L (ref 39–117)
BUN: 12 mg/dL (ref 6–23)
CO2: 27 mEq/L (ref 19–32)
CREATININE: 1.1 mg/dL (ref 0.4–1.5)
Calcium: 8.6 mg/dL (ref 8.4–10.5)
Chloride: 103 mEq/L (ref 96–112)
GFR: 73.88 mL/min (ref >60.00)
Glucose, Bld: 76 mg/dL (ref 70–99)
Potassium: 3.4 mEq/L — ABNORMAL LOW (ref 3.5–5.1)
Sodium: 139 mEq/L (ref 135–145)
Total Bilirubin: 1.1 mg/dL (ref 0.2–1.2)
Total Protein: 7.1 g/dL (ref 6.0–8.3)

## 2014-05-17 LAB — SEDIMENTATION RATE: SED RATE: 35 mm/h — AB (ref 0–22)

## 2014-05-17 MED ORDER — NEOMYCIN-POLYMYXIN-HC 3.5-10000-1 OT SOLN: 3.0000 [drp] | Freq: Three times a day (TID) | OTIC | Status: DC

## 2014-05-17 MED ORDER — OMEPRAZOLE 40 MG PO CPDR: 40.0000 mg | DELAYED_RELEASE_CAPSULE | Freq: Every day | ORAL | Status: DC

## 2014-05-17 NOTE — Progress Notes (Signed)
Subjective:    Patient ID: Brett Castaneda, male    DOB: [birth year], 73 y.o.   MRN: [id number]  HPI 73 year old patient who is seen today for general medical followup.  He is followed closely by neurology and rheumatology. His neurological status has been fairly stable.  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 Complaints today include mild pruritus of the ears.  Otherwise, doing fairly well No pulmonary complaints.  Probably has chronic interstitial pneumonitis related to RA.  O2 saturation remains normal  Past Medical History  Diagnosis Date  . BASAL CELL CARCINOMA, FACE 03/31/2009  . 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 CONSTIPATION 06/22/2008  . DIVERTICULOSIS, COLON 03/24/2007  . GERD 03/24/2007  . HYPERLIPIDEMIA 08/27/2007  . HYPERTENSION 12/14/2008  . Rheumatoid arthritis(714.0) 03/24/2007  . Blind right eye   . Retinal detachment     hx of  . Keratosis Oct. 2013  . Fall at home Nov. 5, 2014  06-28-13    in the home and Outside as well  . 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 Stroke Oct 2014  . 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 Marital Status: Married    Spouse Name: N/A    Number of Children: N/A  . Years of Education: N/A   Occupational History  . Not on file.   Social History Main Topics  . Smoking status: Former Smoker -- 23 years    Types: Cigarettes    Quit date: 09/24/1980  . Smokeless tobacco: Never Used  . Alcohol Use: No  . Drug Use: No  . Sexual Activity: Not on file   Other Topics Concern  . Not on file   Social History Narrative   Lives with wife of 71 years.  They have four children.  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     Past Surgical History  Procedure Laterality Date  . Total knee arthroplasty       bital  . Carpal tunnel release Right 2003  . Cataract extraction  1980    removed cataract in left eye. Currently blind in right eye  . Cholecystectomy  2001    Gall Bladder  . Retinal detachment surgery  2004  . Lumbar puncture  Oct. 21, 2014  . Nerve conduction    . Mri  07-14-13  . Carotid endarterectomy Right 08-15-07    cea  . Joint replacement  2000    Right knee replacement  . Eye surgery  [id number]    retinal detachment  . Eye surgery      left cataract surgery  . Shoulder surgery Left 2009    hx "frozen" shoulder    Family History  Problem Relation Age of Onset  . Stroke Mother 71  . Heart disease Father   . Coronary artery disease Sister   . Heart disease Sister   . Diabetes Sister   . Hypertension Sister     Allergies  Allergen Reactions  . Dopamine Anaphylaxis  . Morphine And Related Other (See Comments)    Hallucinations, very bad reactions  . Vicodin [Hydrocodone-Acetaminophen] Nausea And Vomiting    Current Outpatient Prescriptions on File Prior to Visit  Medication Sig Dispense Refill  . acetaminophen (  TYLENOL) 500 MG tablet Take 500 mg by mouth every 6 (six) hours as needed for fever.      Marland Kitchen alendronate (FOSAMAX) 70 MG tablet Take 70 mg by mouth once a week. Take with a full glass of water on an empty stomach. Takes on Wednesday      . amitriptyline (ELAVIL) 25 MG tablet Take 1 tablet (25 mg total) by mouth at bedtime.  30 tablet  3  . chlorpheniramine (CHLOR-TRIMETON) 4 MG tablet Take 8 mg by mouth at bedtime as needed for allergies.       Marland Kitchen clopidogrel (PLAVIX) 75 MG tablet Take 1 tablet (75 mg total) by mouth daily.  30 tablet  11  . folic acid-pyridoxine-cyancobalamin (VIRT-VITE FORTE) 2.5-25-2 MG TABS Take 1 tablet by mouth once a week. Takes on days he takes methotrexate. (thursday)      . latanoprost (XALATAN) 0.005 % ophthalmic solution Place 1 drop into the left eye at bedtime.       . methotrexate (RHEUMATREX) 2.5 MG tablet Take 10 mg by  mouth once a week. Caution:Chemotherapy. Protect from light.      . Olopatadine HCl (PATADAY) 0.2 % SOLN Apply 1 drop to eye as needed (allergies).      . polyethylene glycol (MIRALAX / GLYCOLAX) packet Take 17 g by mouth daily as needed (for constipation).      . Probiotic Product (PROBIOTIC DAILY PO) Take 1 tablet by mouth daily.      . timolol (TIMOPTIC) 0.5 % ophthalmic solution Place 1 drop into the left eye every evening.        No current facility-administered medications on file prior to visit.    BP 140/88  Pulse 73  Temp(Src) 98.2 F (36.8 C) (Oral)  Resp 20  Ht 5\' 11"  (1.803 m)  Wt 184 lb (83.462 kg)  BMI 25.67 kg/m2  SpO2 98%      Review of Systems  Constitutional: Negative for fever, chills, appetite change and fatigue.  HENT: Positive for hearing loss. Negative for congestion, dental problem, ear pain, sore throat, tinnitus, trouble swallowing and voice change.   Eyes: Negative for pain, discharge and visual disturbance.  Respiratory: Negative for cough, chest tightness, wheezing and stridor.   Cardiovascular: Negative for chest pain, palpitations and leg swelling.  Gastrointestinal: Negative for nausea, vomiting, abdominal pain, diarrhea, constipation, blood in stool and abdominal distention.  Genitourinary: Negative for urgency, hematuria, flank pain, discharge, difficulty urinating and genital sores.  Musculoskeletal: Positive for arthralgias, back pain and gait problem. Negative for joint swelling, myalgias and neck stiffness.  Skin: Negative for rash.  Neurological: Negative for dizziness, syncope, speech difficulty, weakness, numbness and headaches.  Hematological: Negative for adenopathy. Does not bruise/bleed easily.  Psychiatric/Behavioral: Negative for behavioral problems and dysphoric mood. The patient is not nervous/anxious.        Objective:   Physical Exam  Constitutional: He is oriented to person, place, and time. He appears well-developed.    Blood pressure 130/80  HENT:  Head: Normocephalic.  Right Ear: External ear normal.  Left Ear: External ear normal.  Eyes: Conjunctivae and EOM are normal.  Neck: Normal range of motion.  Cardiovascular: Normal rate and normal heart sounds.   Pulmonary/Chest: He has rales.  Bibasilar rales, right greater than left  Abdominal: Bowel sounds are normal.  Musculoskeletal: Normal range of motion. He exhibits no edema and no tenderness.  Neurological: He is alert and oriented to person, place, and time.  Bilateral moderate footdrop  Psychiatric:  He has a normal mood and affect. His behavior is normal.          Assessment & Plan:   Hypertension well controlled Interstitial lung disease, stable Peripheral neuropathy/probable ALS.  Stable  Check updated labs Recheck 6 months

## 2014-05-17 NOTE — Patient Instructions (Signed)
Limit your sodium (Salt) intake  Please check your blood pressure on a regular basis.  If it is consistently greater than 150/90, please make an office appointment.  Return in 6 months for follow-up   

## 2014-05-17 NOTE — Progress Notes (Signed)
Pre visit review using our clinic review tool, if applicable. No additional management support is needed unless otherwise documented below in the visit note. 

## 2014-06-04 ENCOUNTER — Telehealth: Payer: Self-pay | Admitting: *Deleted

## 2014-06-04 NOTE — Telephone Encounter (Signed)
Patients wife call to cancel she will be having back surgery and he will not have a ride. She will call to reschedule in a few weeks

## 2014-06-04 NOTE — Telephone Encounter (Signed)
Patient's wife notified and very grateful to you.  She will call back to r/s.

## 2014-06-04 NOTE — Telephone Encounter (Signed)
Brett Pina, do you mind calling the patient's wife and just letting her know that I wish her well for her upcoming surgery and not to worry about the f/u appointment, we can fit [name] into our schedule as needed.  Elliott Quade K. Posey Pronto, DO

## 2014-06-08 ENCOUNTER — Ambulatory Visit: Payer: Self-pay | Admitting: Neurology

## 2014-08-02 ENCOUNTER — Other Ambulatory Visit: Payer: Self-pay | Admitting: Internal Medicine

## 2014-08-04 ENCOUNTER — Encounter: Payer: Self-pay | Admitting: Family

## 2014-08-05 ENCOUNTER — Ambulatory Visit (INDEPENDENT_AMBULATORY_CARE_PROVIDER_SITE_OTHER): Payer: Medicare Other | Admitting: Family

## 2014-08-05 ENCOUNTER — Encounter: Payer: Self-pay | Admitting: Family

## 2014-08-05 ENCOUNTER — Ambulatory Visit (HOSPITAL_COMMUNITY)
Admission: RE | Admit: 2014-08-05 | Discharge: 2014-08-05 | Disposition: A | Discharge status: Home / Self Care | Payer: Medicare Other | Source: Ambulatory Visit | Attending: Family | Admitting: Family

## 2014-08-05 ENCOUNTER — Ambulatory Visit (INDEPENDENT_AMBULATORY_CARE_PROVIDER_SITE_OTHER)
Admission: RE | Admit: 2014-08-05 | Discharge: 2014-08-05 | Disposition: A | Discharge status: Home / Self Care | Payer: Medicare Other | Source: Ambulatory Visit | Attending: Family | Admitting: Family

## 2014-08-05 VITALS — BP 132/86 | HR 72 | Resp 16 | Ht 71.0 in | Wt 184.0 lb

## 2014-08-05 DIAGNOSIS — I714 Abdominal aortic aneurysm, without rupture, unspecified: Secondary | ICD-10-CM

## 2014-08-05 DIAGNOSIS — I6523 Occlusion and stenosis of bilateral carotid arteries: Secondary | ICD-10-CM

## 2014-08-05 DIAGNOSIS — Z9889 Other specified postprocedural states: Secondary | ICD-10-CM

## 2014-08-05 DIAGNOSIS — Z48812 Encounter for surgical aftercare following surgery on the circulatory system: Secondary | ICD-10-CM

## 2014-08-05 NOTE — Patient Instructions (Signed)
Stroke Prevention Some medical conditions and behaviors are associated with an increased chance of having a stroke. You may prevent a stroke by making healthy choices and managing medical conditions. HOW CAN I REDUCE MY RISK OF HAVING A STROKE?   Stay physically active. Get at least 30 minutes of activity on most or all days.  Do not smoke. It may also be helpful to avoid exposure to secondhand smoke.  Limit alcohol use. Moderate alcohol use is considered to be:  No more than 2 drinks per day for men.  No more than 1 drink per day for nonpregnant women.  Eat healthy foods. This involves:  Eating 5 or more servings of fruits and vegetables a day.  Making dietary changes that address high blood pressure (hypertension), high cholesterol, diabetes, or obesity.  Manage your cholesterol levels.  Making food choices that are high in fiber and low in saturated fat, trans fat, and cholesterol may control cholesterol levels.  Take any prescribed medicines to control cholesterol as directed by your health care provider.  Manage your diabetes.  Controlling your carbohydrate and sugar intake is recommended to manage diabetes.  Take any prescribed medicines to control diabetes as directed by your health care provider.  Control your hypertension.  Making food choices that are low in salt (sodium), saturated fat, trans fat, and cholesterol is recommended to manage hypertension.  Take any prescribed medicines to control hypertension as directed by your health care provider.  Maintain a healthy weight.  Reducing calorie intake and making food choices that are low in sodium, saturated fat, trans fat, and cholesterol are recommended to manage weight.  Stop drug abuse.  Avoid taking birth control pills.  Talk to your health care provider about the risks of taking birth control pills if you are over 35 years old, smoke, get migraines, or have ever had a blood clot.  Get evaluated for sleep  disorders (sleep apnea).  Talk to your health care provider about getting a sleep evaluation if you snore a lot or have excessive sleepiness.  Take medicines only as directed by your health care provider.  For some people, aspirin or blood thinners (anticoagulants) are helpful in reducing the risk of forming abnormal blood clots that can lead to stroke. If you have the irregular heart rhythm of atrial fibrillation, you should be on a blood thinner unless there is a good reason you cannot take them.  Understand all your medicine instructions.  Make sure that other conditions (such as anemia or atherosclerosis) are addressed. SEEK IMMEDIATE MEDICAL CARE IF:   You have sudden weakness or numbness of the face, arm, or leg, especially on one side of the body.  Your face or eyelid droops to one side.  You have sudden confusion.  You have trouble speaking (aphasia) or understanding.  You have sudden trouble seeing in one or both eyes.  You have sudden trouble walking.  You have dizziness.  You have a loss of balance or coordination.  You have a sudden, severe headache with no known cause.  You have new chest pain or an irregular heartbeat. Any of these symptoms may represent a serious problem that is an emergency. Do not wait to see if the symptoms will go away. Get medical help at once. Call your local emergency services (911 in [place] Do not drive yourself to the hospital. Document Released: 10/18/2004 Document Revised: 01/25/2014 Document Reviewed: 03/13/2013 ExitCare Patient Information 2015 [employer]. This information is not intended to replace advice given   to you by your health care provider. Make sure you discuss any questions you have with your health care provider.   Abdominal Aortic Aneurysm An aneurysm is a weakened or damaged part of an artery wall that bulges from the normal force of blood pumping through the body. An abdominal aortic aneurysm is an aneurysm that  occurs in the lower part of the aorta, the main artery of the body.  The major concern with an abdominal aortic aneurysm is that it can enlarge and burst (rupture) or blood can flow between the layers of the wall of the aorta through a tear (aorticdissection). Both of these conditions can cause bleeding inside the body and can be life threatening unless diagnosed and treated promptly. CAUSES  The exact cause of an abdominal aortic aneurysm is unknown. Some contributing factors are:   A hardening of the arteries caused by the buildup of fat and other substances in the lining of a blood vessel (arteriosclerosis).  Inflammation of the walls of an artery (arteritis).   Connective tissue diseases, such as Marfan syndrome.   Abdominal trauma.   An infection, such as syphilis or staphylococcus, in the wall of the aorta (infectious aortitis) caused by bacteria. RISK FACTORS  Risk factors that contribute to an abdominal aortic aneurysm may include:  Age older than 58 years.   High blood pressure (hypertension).  Male gender.  Ethnicity (white race).  Obesity.  Family history of aneurysm (first degree relatives only).  Tobacco use. PREVENTION  The following healthy lifestyle habits may help decrease your risk of abdominal aortic aneurysm:  Quitting smoking. Smoking can raise your blood pressure and cause arteriosclerosis.  Limiting or avoiding alcohol.  Keeping your blood pressure, blood sugar level, and cholesterol levels within normal limits.  Decreasing your salt intake. In somepeople, too much salt can raise blood pressure and increase your risk of abdominal aortic aneurysm.  Eating a diet low in saturated fats and cholesterol.  Increasing your fiber intake by including whole grains, vegetables, and fruits in your diet. Eating these foods may help lower blood pressure.  Maintaining a healthy weight.  Staying physically active and exercising regularly. SYMPTOMS  The  symptoms of abdominal aortic aneurysm may vary depending on the size and rate of growth of the aneurysm.Most grow slowly and do not have any symptoms. When symptoms do occur, they may include:  Pain (abdomen, side, lower back, or groin). The pain may vary in intensity. A sudden onset of severe pain may indicate that the aneurysm has ruptured.  Feeling full after eating only small amounts of food.  Nausea or vomiting or both.  Feeling a pulsating lump in the abdomen.  Feeling faint or passing out. DIAGNOSIS  Since most unruptured abdominal aortic aneurysms have no symptoms, they are often discovered during diagnostic exams for other conditions. An aneurysm may be found during the following procedures:  Ultrasonography (A one-time screening for abdominal aortic aneurysm by ultrasonography is also recommended for all men aged 5-75 years who have ever smoked).  X-ray exams.  A computed tomography (CT).  Magnetic resonance imaging (MRI).  Angiography or arteriography. TREATMENT  Treatment of an abdominal aortic aneurysm depends on the size of your aneurysm, your age, and risk factors for rupture. Medication to control blood pressure and pain may be used to manage aneurysms smaller than 6 cm. Regular monitoring for enlargement may be recommended by your caregiver if:  The aneurysm is 3-4 cm in size (an annual ultrasonography may be recommended).  The  The aneurysm is 4-4.5 cm in size (an ultrasonography every 6 months may be recommended).  The aneurysm is larger than 4.5 cm in size (your caregiver may ask that you be examined by a vascular surgeon). If your aneurysm is larger than 6 cm, surgical repair may be recommended. There are two main methods for repair of an aneurysm:   Endovascular repair (a minimally invasive surgery). This is done most often.  Open repair. This method is used if an endovascular repair is not possible. Document Released: 06/20/2005 Document Revised: 01/05/2013  Document Reviewed: 10/10/2012 ExitCare Patient Information 2015 [employer]. This information is not intended to replace advice given to you by your health care provider. Make sure you discuss any questions you have with your health care provider.  

## 2014-08-05 NOTE — Progress Notes (Signed)
[facility] [place] HISTORY AND PHYSICAL   MRN : [id number]  History of Present Illness:   Brett Castaneda is a 73 y.o. male patient of Dr. Oneida Alar who is status post right CEA in 2008 and has a known small AAA. He returns today for follow up. Legs are weak, he staggers, no strength in his legs, his body has a forward momentum that he has trouble stopping. ABI's done Sept., 2014 demonstrate no evidence of arterial occlusive disease.  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 He had TIA's prior to the CEA, then had left arm and leg weakness after the CEA which resolved before he was discharged from the hospital. He has had no further stroke or TIA symptoms. The patient states he hurt his back a few days ago by bending over, this pain is improving. He states Dr. Posey Pronto told him that he has several calcified vertebrae. 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 Patient denies expressive or receptive aphasia.  Patient states he has no heart disease, but has a hx of small aneurysm "behind the heart" and he states this has improved, states coumadin was stopped since this improved. July, 2014 he had an abdominal aortic US which showed abdominal aortic aneurysm measuring up to 3.4 cm.  Patient denies New Medical or Surgical History. 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 He exercises on a stationary bike regularly.   Pt Diabetic: No Pt smoker: former smoker, quit 1982  Pt meds include: Statin : Yes, wife states his cholesterol is good ASA: no Other anticoagulants/antiplatelets: Plavix     Current Outpatient Prescriptions  Medication Sig Dispense Refill  . acetaminophen (TYLENOL) 500 MG tablet Take 500 mg by mouth every 6 (six) hours as needed for fever.    Marland Kitchen alendronate (FOSAMAX) 70 MG tablet Take 70 mg by mouth once a week. Take with a full glass of water on an  empty stomach. Takes on Wednesday    . chlorpheniramine (CHLOR-TRIMETON) 4 MG tablet Take 8 mg by mouth as needed for allergies.     Marland Kitchen clopidogrel (PLAVIX) 75 MG tablet Take 1 tablet (75 mg total) by mouth daily. 30 tablet 11  . folic acid-pyridoxine-cyancobalamin (VIRT-VITE FORTE) 2.5-25-2 MG TABS Take 1 tablet by mouth once a week. Takes on days he takes methotrexate. (thursday)    . latanoprost (XALATAN) 0.005 % ophthalmic solution Place 1 drop into the left eye at bedtime.     . methotrexate (RHEUMATREX) 2.5 MG tablet Take 10 mg by mouth once a week. Caution:Chemotherapy. Protect from light.    . neomycin-polymyxin-hydrocortisone (CORTISPORIN) otic solution Place 3 drops into both ears 3 (three) times daily. 10 mL 2  . Olopatadine HCl (PATADAY) 0.2 % SOLN Apply 1 drop to eye as needed (allergies).    Marland Kitchen omeprazole (PRILOSEC) 40 MG capsule Take 1 capsule (40 mg total) by mouth daily. 90 capsule 3  . polyethylene glycol (MIRALAX / GLYCOLAX) packet Take 17 g by mouth daily as needed (for constipation).    . Probiotic Product (PROBIOTIC DAILY PO) Take 1 tablet by mouth daily.    . timolol (TIMOPTIC) 0.5 % ophthalmic solution Place 1 drop into the left eye every evening.     Marland Kitchen amitriptyline (ELAVIL) 25 MG tablet Take 1 tablet (25 mg total) by mouth at bedtime. 30 tablet 3  . VIRT-VITE FORTE 2.5-25-2  MG TABS take 1 tablet by mouth daily 90 tablet 0   No current facility-administered medications for this visit.    Past Medical History  Diagnosis Date  . BASAL CELL CARCINOMA, FACE 03/31/2009  . 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 CONSTIPATION 06/22/2008  . DIVERTICULOSIS, COLON 03/24/2007  . GERD 03/24/2007  . HYPERLIPIDEMIA 08/27/2007  . HYPERTENSION 12/14/2008  . Rheumatoid arthritis(714.0) 03/24/2007  . Blind right eye   . Retinal detachment     hx of  . Keratosis Oct. 2013  . Fall at home Nov. 5, 2014  06-28-13    in the home and Outside as well  .  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 Stroke Oct 2014  . 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 Smoking status: Former Smoker -- 23 years    Types: Cigarettes    Quit date: 09/24/1980  . Smokeless tobacco: Never Used  . Alcohol Use: No    Family History Family History  Problem Relation Age of Onset  . Stroke Mother 71  . Hypertension Mother   . Heart disease Father   . Aneurysm Father   . Coronary artery disease Sister   . Pneumonia Sister   . Heart disease Sister   . Diabetes Sister   . Hypertension Sister     Surgical History Past Surgical History  Procedure Laterality Date  . Total knee arthroplasty      bital  . Carpal tunnel release Right 2003  . Cataract extraction  1980    removed cataract in left eye. Currently blind in right eye  . Cholecystectomy  2001    Gall Bladder  . Retinal detachment surgery  2004  . Lumbar puncture  Oct. 21, 2014  . Nerve conduction    . Mri  07-14-13  . Carotid endarterectomy Right 08-15-07    cea  . Joint replacement  2000    Right knee replacement  . Eye surgery  [id number],1443    retinal detachment  . Eye surgery      left cataract surgery  . Shoulder surgery Left 2009    hx "frozen" shoulder    Allergies  Allergen Reactions  . Dopamine Anaphylaxis  . Morphine And Related Other (See Comments)    Hallucinations, very bad reactions  . Vicodin [Hydrocodone-Acetaminophen] Nausea And Vomiting    Current Outpatient Prescriptions  Medication Sig Dispense Refill  . acetaminophen (TYLENOL) 500 MG tablet Take 500 mg by mouth every 6 (six) hours as needed for fever.    Marland Kitchen alendronate (FOSAMAX) 70 MG tablet Take 70 mg by mouth once a week. Take with a full glass of water on an empty stomach. Takes on Wednesday    . chlorpheniramine (CHLOR-TRIMETON) 4 MG tablet Take 8 mg by mouth as needed for  allergies.     Marland Kitchen clopidogrel (PLAVIX) 75 MG tablet Take 1 tablet (75 mg total) by mouth daily. 30 tablet 11  . folic acid-pyridoxine-cyancobalamin (VIRT-VITE FORTE) 2.5-25-2 MG TABS Take 1 tablet by mouth once a week. Takes on days he takes methotrexate. (thursday)    . latanoprost (XALATAN) 0.005 % ophthalmic solution Place 1 drop into the left eye at bedtime.     . methotrexate (RHEUMATREX) 2.5 MG tablet Take 10 mg by mouth once a week. Caution:Chemotherapy. Protect from  light.    . neomycin-polymyxin-hydrocortisone (CORTISPORIN) otic solution Place 3 drops into both ears 3 (three) times daily. 10 mL 2  . Olopatadine HCl (PATADAY) 0.2 % SOLN Apply 1 drop to eye as needed (allergies).    Marland Kitchen omeprazole (PRILOSEC) 40 MG capsule Take 1 capsule (40 mg total) by mouth daily. 90 capsule 3  . polyethylene glycol (MIRALAX / GLYCOLAX) packet Take 17 g by mouth daily as needed (for constipation).    . Probiotic Product (PROBIOTIC DAILY PO) Take 1 tablet by mouth daily.    . timolol (TIMOPTIC) 0.5 % ophthalmic solution Place 1 drop into the left eye every evening.     Marland Kitchen amitriptyline (ELAVIL) 25 MG tablet Take 1 tablet (25 mg total) by mouth at bedtime. 30 tablet 3  . VIRT-VITE FORTE 2.5-25-2 MG TABS take 1 tablet by mouth daily 90 tablet 0   No current facility-administered medications for this visit.     REVIEW OF SYSTEMS: See HPI for pertinent positives and negatives.  Physical Examination Filed Vitals:   08/05/14 1032 08/05/14 1034  BP: 145/90 132/86  Pulse: 73 72  Resp:  16  Height:  5\' 11"  (1.803 m)  Weight:  184 lb (83.462 kg)  SpO2:  97%   Body mass index is 25.67 kg/(m^2).  General: WDWN male in NAD GAIT: slow and deliberate, using walker. Eyes: right pupil is non reactive, lateral gaze of right eye only. Left pupil is reactive to light Pulmonary: CTAB, Negative Rales, Negative rhonchi, & Negative wheezing.  Cardiac: regular Rhythm, no detected murmur  VASCULAR EXAM Carotid  Bruits Left Right   Negative Negative   Aorta is not palpable. Radial pulses are 2+ palpable and equal.      LE Pulses LEFT RIGHT   FEMORAL  palpable  palpable    POPLITEAL not palpable  not palpable  POSTERIOR TIBIAL palpable palpable  DORSALIS PEDIS palpable palpable    Gastrointestinal: soft, nontender, BS WNL, no r/g, negative masses.  Musculoskeletal: mild generalized muscle atrophy/wasting. M/S 3/5 throughout, Extremities without ischemic changes.  Neurologic: A&O X 3; Appropriate Affect ; SENSATION ;normal;  Speech is normal CN 2-12 intact , Pain and light touch intact in extremities, Motor exam as listed above.    Non-Invasive Vascular Imaging (08/05/2014):   CEREBROVASCULAR DUPLEX EVALUATION    INDICATION: Carotid artery disease    PREVIOUS INTERVENTION(S): Right carotid endarterectomy 08/15/2007 by Dr. Amedeo Plenty    DUPLEX EXAM: Carotid duplex    RIGHT  LEFT  Peak Systolic Velocities (cm/s) End Diastolic Velocities (cm/s) Plaque LOCATION Peak Systolic Velocities (cm/s) End Diastolic Velocities (cm/s) Plaque  75 17 - CCA PROXIMAL 111 27 -  76 16 - CCA MID 87 22 -  72 15 HT CCA DISTAL 70 21 HT  100 22 - ECA 122 17 HT  44 13 HT ICA PROXIMAL 40 13 HT  69 25 - ICA MID 72 26 -  77 27 - ICA DISTAL 91 34 -    N/A ICA / CCA Ratio (PSV) 1.04  Antegrade Vertebral Flow Antegrade  094 Brachial Systolic Pressure (mmHg) 709  Triphasic Brachial Artery Waveforms Triphasic    Plaque Morphology:  HM = Homogeneous, HT = Heterogeneous, CP = Calcific Plaque, SP = Smooth Plaque, IP = Irregular Plaque     ADDITIONAL FINDINGS:     IMPRESSION: 1. Patent right carotid endarterectomy site with no evidence for restenosis. 2. Abnormal right vertebral artery waveform, unable to thoroughly visualize 3. Less than 40% left  internal  carotid artery stenosis.    Compared to the previous exam:  No significant change     ABDOMINAL AORTA DUPLEX EVALUATION    INDICATION: Follow up abdominal aortic dilitation    PREVIOUS INTERVENTION(S): None    DUPLEX EXAM:     LOCATION DIAMETER AP (cm) DIAMETER TRANSVERSE (cm) VELOCITIES (cm/sec)  Aorta Proximal 2.5 - 62  Aorta Mid 3.1 2.8 35  Aorta Distal 2.3 2.3 39  Right Common Iliac Artery 1.1 1.1 147  Left Common Iliac Artery .93 .73 128    Previous max aortic diameter:  3.0 x 3.4 Date: 04/09/2013     ADDITIONAL FINDINGS:     IMPRESSION: 1. Technically difficult exam due to extreme bowel gas. 2. Largest diameter of the aorta measures 3.1 x 2.8 cm in the mid segment    Compared to the previous exam:  No change      ASSESSMENT:  GARLON TUGGLE is a 73 y.o. male who is status post right CEA in 2008 and has a known small AAA. He has had no stroke or TIA since 2008. Carotid Duplex today reveals a patent right carotid endarterectomy site with no evidence for restenosis. Abnormal right vertebral artery waveform, unable to thoroughly visualize Less than 40% left internal carotid artery stenosis. No change from previous Duplex.  AAA Duplex today reveals a technically difficult exam due to extreme bowel gas. Largest diameter of the aorta measures 3.1 x 2.8 cm in the mid segment. No change from previous AAA Duplex, small AAA.   PLAN:   Based on today's exam and non-invasive vascular lab results, the patient will follow up in 1 year with the following tests: carotid Duplex and AAA Duplex. I discussed in depth with the patient the nature of atherosclerosis, and emphasized the importance of maximal medical management including strict control of blood pressure, blood glucose, and lipid levels, obtaining regular exercise, and cessation of smoking.  The patient is aware that without maximal medical management the underlying atherosclerotic disease process will progress,  limiting the benefit of any interventions. Consideration for repair of AAA would be made when the size is 5.5 cm, growth > 1 cm/yr, and symptomatic status. The patient was given information about stroke prevention and what symptoms should prompt the patient to seek immediate medical care. The patient was given information about AAA including signs, symptoms, treatment,  what symptoms should prompt the patient to seek immediate medical care, and how to minimize the risk of enlargement and rupture of aneurysms.  Thank you for allowing [place] to participate in this patient's care.  Clemon Chambers, RN, MSN, FNP-C [facility] Office: [phone]  Clinic MD: [facility] 08/05/2014 10:42 AM

## 2014-08-05 NOTE — Addendum Note (Signed)
Addended by: Mena Goes on: 08/05/2014 01:35 PM   Modules accepted: Orders

## 2014-09-03 ENCOUNTER — Other Ambulatory Visit: Payer: Self-pay | Admitting: Neurology

## 2014-09-03 NOTE — Telephone Encounter (Signed)
Rx sent 

## 2014-10-05 DIAGNOSIS — Z7952 Long term (current) use of systemic steroids: Secondary | ICD-10-CM | POA: Diagnosis not present

## 2014-10-05 DIAGNOSIS — M069 Rheumatoid arthritis, unspecified: Secondary | ICD-10-CM | POA: Diagnosis not present

## 2014-10-14 ENCOUNTER — Encounter: Payer: Self-pay | Admitting: Rheumatology

## 2014-10-19 DIAGNOSIS — H44521 Atrophy of globe, right eye: Secondary | ICD-10-CM | POA: Diagnosis not present

## 2014-10-19 DIAGNOSIS — H538 Other visual disturbances: Secondary | ICD-10-CM | POA: Diagnosis not present

## 2014-10-19 DIAGNOSIS — H3531 Nonexudative age-related macular degeneration: Secondary | ICD-10-CM | POA: Diagnosis not present

## 2014-10-19 DIAGNOSIS — H4720 Unspecified optic atrophy: Secondary | ICD-10-CM | POA: Diagnosis not present

## 2014-11-02 ENCOUNTER — Ambulatory Visit (INDEPENDENT_AMBULATORY_CARE_PROVIDER_SITE_OTHER): Payer: Medicare Other | Admitting: Internal Medicine

## 2014-11-02 ENCOUNTER — Encounter: Payer: Self-pay | Admitting: Internal Medicine

## 2014-11-02 DIAGNOSIS — E785 Hyperlipidemia, unspecified: Secondary | ICD-10-CM | POA: Diagnosis not present

## 2014-11-02 DIAGNOSIS — I10 Essential (primary) hypertension: Secondary | ICD-10-CM | POA: Diagnosis not present

## 2014-11-02 DIAGNOSIS — M0510 Rheumatoid lung disease with rheumatoid arthritis of unspecified site: Secondary | ICD-10-CM | POA: Diagnosis not present

## 2014-11-02 DIAGNOSIS — Z23 Encounter for immunization: Secondary | ICD-10-CM

## 2014-11-02 MED ORDER — CLOPIDOGREL BISULFATE 75 MG PO TABS: 75.0000 mg | ORAL_TABLET | Freq: Every day | ORAL | Status: DC

## 2014-11-02 NOTE — Progress Notes (Signed)
Subjective:    Patient ID: Brett Castaneda, male    DOB: [birth year], 74 y.o.   MRN: [id number]  HPI  74 year old patient who has a history of RA and rheumatoid lung disease.  He is followed by neurology, both locally and also at [facility] for a severe peripheral neuropathy.  There has also been some clinical suspicion for ALS.  For the past 6 months.  The patient feels that he perhaps has improved slightly.  He does exercise on an exercise bike at least 25 minutes per day and feels that his upper leg strength has improved.  He continues to have  near-complete bilateral foot drop.  He has gait instability and tends to fall backwards due to his neuropathy   Past Medical History  Diagnosis Date  . BASAL CELL CARCINOMA, FACE 03/31/2009  . 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 CONSTIPATION 06/22/2008  . DIVERTICULOSIS, COLON 03/24/2007  . GERD 03/24/2007  . HYPERLIPIDEMIA 08/27/2007  . HYPERTENSION 12/14/2008  . Rheumatoid arthritis(714.0) 03/24/2007  . Blind right eye   . Retinal detachment     hx of  . Keratosis Oct. 2013  . Fall at home Nov. 5, 2014  06-28-13    in the home and Outside as well  . 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 Stroke Oct 2014  . 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 Marital Status: Married    Spouse Name: N/A    Number of Children: N/A  . Years of Education: N/A   Occupational History  . Not on file.   Social History Main Topics  . Smoking status: Former Smoker -- 23 years    Types: Cigarettes    Quit date: 09/24/1980  . Smokeless tobacco: Never Used  . Alcohol Use: No  . Drug Use: No  . Sexual Activity: Not on file   Other Topics Concern  . Not on file   Social History Narrative   Lives with wife of 6 years.  They have four children.  He previously worked in [employer], Dealer, as well as  other maintenance work.     Past Surgical History  Procedure Laterality Date  . Total knee arthroplasty      bital  . Carpal tunnel release Right 2003  . Cataract extraction  1980    removed cataract in left eye. Currently blind in right eye  . Cholecystectomy  2001    Gall Bladder  . Retinal detachment surgery  2004  . Lumbar puncture  Oct. 21, 2014  . Nerve conduction    . Mri  07-14-13  . Carotid endarterectomy Right 08-15-07    cea  . Joint replacement  2000    Right knee replacement  . Eye surgery  [id number]    retinal detachment  . Eye surgery      left cataract surgery  . Shoulder surgery Left 2009    hx "frozen" shoulder    Family History  Problem Relation Age of Onset  . Stroke Mother 31  . Hypertension Mother   . Heart disease Father   . Aneurysm Father   . Coronary artery disease Sister   . Pneumonia Sister   . Heart disease Sister   . Diabetes Sister   . Hypertension Sister     Allergies  Allergen  Reactions  . Dopamine Anaphylaxis  . Morphine And Related Other (See Comments)    Hallucinations, very bad reactions  . Vicodin [Hydrocodone-Acetaminophen] Nausea And Vomiting    Current Outpatient Prescriptions on File Prior to Visit  Medication Sig Dispense Refill  . acetaminophen (TYLENOL) 500 MG tablet Take 500 mg by mouth every 6 (six) hours as needed for fever.    Marland Kitchen alendronate (FOSAMAX) 70 MG tablet Take 70 mg by mouth once a week. Take with a full glass of water on an empty stomach. Takes on Wednesday    . amitriptyline (ELAVIL) 25 MG tablet Take 1 tablet (25 mg total) by mouth at bedtime. 30 tablet 3  . chlorpheniramine (CHLOR-TRIMETON) 4 MG tablet Take 8 mg by mouth as needed for allergies.     . folic acid-pyridoxine-cyancobalamin (VIRT-VITE FORTE) 2.5-25-2 MG TABS Take 1 tablet by mouth once a week. Takes on days he takes methotrexate. (thursday)    . latanoprost (XALATAN) 0.005 % ophthalmic solution Place 1 drop into the left eye at bedtime.      . methotrexate (RHEUMATREX) 2.5 MG tablet Take 10 mg by mouth once a week. Caution:Chemotherapy. Protect from light.    . polyethylene glycol (MIRALAX / GLYCOLAX) packet Take 17 g by mouth daily as needed (for constipation).    . Probiotic Product (PROBIOTIC DAILY PO) Take 1 tablet by mouth daily.    . timolol (TIMOPTIC) 0.5 % ophthalmic solution Place 1 drop into the left eye every evening.      No current facility-administered medications on file prior to visit.    BP 146/90 mmHg  Pulse 69  Temp(Src) 97.9 F (36.6 C) (Oral)  Resp 20  Ht 5\' 11"  (1.803 m)  Wt 184 lb (83.462 kg)  BMI 25.67 kg/m2  SpO2 95%    Review of Systems  Constitutional: Negative for fever, chills, appetite change and fatigue.  HENT: Negative for congestion, dental problem, ear pain, hearing loss, sore throat, tinnitus, trouble swallowing and voice change.   Eyes: Negative for pain, discharge and visual disturbance.  Respiratory: Negative for cough, chest tightness, wheezing and stridor.   Cardiovascular: Negative for chest pain, palpitations and leg swelling.  Gastrointestinal: Negative for nausea, vomiting, abdominal pain, diarrhea, constipation, blood in stool and abdominal distention.  Genitourinary: Negative for urgency, hematuria, flank pain, discharge, difficulty urinating and genital sores.  Musculoskeletal: Positive for gait problem. Negative for myalgias, back pain, joint swelling, arthralgias and neck stiffness.  Skin: Negative for rash.  Neurological: Positive for weakness. Negative for dizziness, syncope, speech difficulty, numbness and headaches.  Hematological: Negative for adenopathy. Does not bruise/bleed easily.  Psychiatric/Behavioral: Negative for behavioral problems and dysphoric mood. The patient is not nervous/anxious.        Objective:   Physical Exam  Constitutional: He is oriented to person, place, and time. He appears well-developed.  Blood pressure 136/86  HENT:  Head:  Normocephalic.  Right Ear: External ear normal.  Left Ear: External ear normal.  Eyes: Conjunctivae and EOM are normal.  Neck: Normal range of motion.  Cardiovascular: Normal rate and normal heart sounds.   Pulmonary/Chest: He has rales.  Abdominal: Bowel sounds are normal.  Musculoskeletal: Normal range of motion. He exhibits no edema or tenderness.  Neurological: He is alert and oriented to person, place, and time.  Bilateral foot drop Absent Achilles reflexes Patellar reflexes were intact  Intact hip flexion  Psychiatric: He has a normal mood and affect. His behavior is normal.  Assessment & Plan:   Sensorimotor peripheral neuropathy RA History of cerebrovascular disease Hypertension   No change in medical regimen CPX in 6 months

## 2014-11-02 NOTE — Patient Instructions (Signed)
Limit your sodium (Salt) intake    It is important that you exercise regularly, at least 20 minutes 3 to 4 times per week.  If you develop chest pain or shortness of breath seek  medical attention.  Return in 6 months for follow-up  

## 2014-12-10 DIAGNOSIS — H4011X4 Primary open-angle glaucoma, indeterminate stage: Secondary | ICD-10-CM | POA: Diagnosis not present

## 2015-01-31 DIAGNOSIS — M79642 Pain in left hand: Secondary | ICD-10-CM | POA: Diagnosis not present

## 2015-01-31 DIAGNOSIS — M059 Rheumatoid arthritis with rheumatoid factor, unspecified: Secondary | ICD-10-CM | POA: Diagnosis not present

## 2015-01-31 DIAGNOSIS — M25532 Pain in left wrist: Secondary | ICD-10-CM | POA: Diagnosis not present

## 2015-01-31 DIAGNOSIS — M25531 Pain in right wrist: Secondary | ICD-10-CM | POA: Diagnosis not present

## 2015-01-31 DIAGNOSIS — Z79899 Other long term (current) drug therapy: Secondary | ICD-10-CM | POA: Diagnosis not present

## 2015-01-31 DIAGNOSIS — M79641 Pain in right hand: Secondary | ICD-10-CM | POA: Diagnosis not present

## 2015-03-01 ENCOUNTER — Ambulatory Visit (INDEPENDENT_AMBULATORY_CARE_PROVIDER_SITE_OTHER): Payer: Medicare Other | Admitting: Internal Medicine

## 2015-03-01 ENCOUNTER — Encounter: Payer: Self-pay | Admitting: Internal Medicine

## 2015-03-01 VITALS — BP 150/90 | HR 90 | Temp 99.3°F | Resp 20 | Ht 71.0 in | Wt 175.0 lb

## 2015-03-01 DIAGNOSIS — I10 Essential (primary) hypertension: Secondary | ICD-10-CM | POA: Diagnosis not present

## 2015-03-01 DIAGNOSIS — E785 Hyperlipidemia, unspecified: Secondary | ICD-10-CM

## 2015-03-01 DIAGNOSIS — G629 Polyneuropathy, unspecified: Secondary | ICD-10-CM | POA: Diagnosis not present

## 2015-03-01 DIAGNOSIS — R350 Frequency of micturition: Secondary | ICD-10-CM

## 2015-03-01 DIAGNOSIS — M0510 Rheumatoid lung disease with rheumatoid arthritis of unspecified site: Secondary | ICD-10-CM

## 2015-03-01 LAB — POCT URINALYSIS DIPSTICK
BILIRUBIN UA: NEGATIVE
Blood, UA: NEGATIVE
Glucose, UA: NEGATIVE
Ketones, UA: NEGATIVE
LEUKOCYTES UA: NEGATIVE
NITRITE UA: NEGATIVE
PH UA: 6
Protein, UA: NEGATIVE
SPEC GRAV UA: 1.01
Urobilinogen, UA: 0.2

## 2015-03-01 MED ORDER — PREDNISONE 5 MG PO TABS
5.0000 mg | ORAL_TABLET | Freq: Every day | ORAL | Status: DC
Start: 2015-03-01 — End: 2015-05-03

## 2015-03-01 MED ORDER — TAMSULOSIN HCL 0.4 MG PO CAPS: 0.4000 mg | ORAL_CAPSULE | Freq: Every day | ORAL | Status: DC

## 2015-03-01 NOTE — Patient Instructions (Signed)
Flomax once daily  Minimize caffeine use  Follow-up neurology and rheumatology  Prednisone 10 mg twice daily for 3 days, then 10 mg daily for one week, then 5 mg daily for one week, then 2.5 milligrams daily for one week, then 2.5 milligrams every other day for 2 weeks  Benign Prostatic Hyperplasia An enlarged prostate (benign prostatic hyperplasia) is common in older men. You may experience the following:  Weak urine stream.  Dribbling.  Feeling like the bladder has not emptied completely.  Difficulty starting urination.  Getting up frequently at night to urinate.  Urinating more frequently during the day. HOME CARE INSTRUCTIONS  Monitor your prostatic hyperplasia for any changes. The following actions may help to alleviate any discomfort you are experiencing:  Give yourself time when you urinate.  Stay away from alcohol.  Avoid beverages containing caffeine, such as coffee, tea, and colas, because they can make the problem worse.  Avoid decongestants, antihistamines, and some prescription medicines that can make the problem worse.  Follow up with your health care provider for further treatment as recommended. SEEK MEDICAL CARE IF:  You are experiencing progressive difficulty voiding.  Your urine stream is progressively getting narrower.  You are awaking from sleep with the urge to void more frequently.  You are constantly feeling the need to void.  You experience loss of urine, especially in small amounts. SEEK IMMEDIATE MEDICAL CARE IF:   You develop increased pain with urination or are unable to urinate.  You develop severe abdominal pain, vomiting, a high fever, or fainting.  You develop back pain or blood in your urine. MAKE SURE YOU:   Understand these instructions.  Will watch your condition.  Will get help right away if you are not doing well or get worse. Document Released: 09/10/2005 Document Revised: 05/13/2013 Document Reviewed:  02/10/2013 [facility] Patient Information 2015 [place], [place]. This information is not intended to replace advice given to you by your health care provider. Make sure you discuss any questions you have with your health care provider.

## 2015-03-01 NOTE — Progress Notes (Signed)
Pre visit review using our clinic review tool, if applicable. No additional management support is needed unless otherwise documented below in the visit note. 

## 2015-03-01 NOTE — Progress Notes (Signed)
Subjective:    Patient ID: Brett Castaneda, male    DOB: 1941-04-24, 74 y.o.   MRN: [id number]  HPI  74 year old patient who has a history of RA, hypertension, dyslipidemia.  He has been followed by rheumatology and has been on prednisone since February 9.  He was tapered down to a 10 mg dose, but self discontinued 5 days ago.  Complaints include urinary frequency, dysuria and a pressure sensation.  Urinalysis was reviewed and was normal.  He complains of increasing fatigue  His neurological status has been remarkably stable.  He states he actually feels improved over the past year.  He uses a walker only rarely.  He does have a history of a sensorimotor neuropathy, possibly related to RA and also thought perhaps ALS He has had some low-grade fever  Past Medical History  Diagnosis Date  . BASAL CELL CARCINOMA, FACE 03/31/2009  . 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 CONSTIPATION 06/22/2008  . DIVERTICULOSIS, COLON 03/24/2007  . GERD 03/24/2007  . HYPERLIPIDEMIA 08/27/2007  . HYPERTENSION 12/14/2008  . Rheumatoid arthritis(714.0) 03/24/2007  . Blind right eye   . Retinal detachment     hx of  . Keratosis Oct. 2013  . Fall at home Nov. 5, 2014  06-28-13    in the home and Outside as well  . 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 Stroke Oct 2014  . 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 Marital Status: Married    Spouse Name: N/A  . Number of Children: N/A  . Years of Education: N/A   Occupational History  . Not on file.   Social History Main Topics  . Smoking status: Former Smoker -- 23 years    Types: Cigarettes    Quit date: 09/24/1980  . Smokeless tobacco: Never Used  . Alcohol Use: No  . Drug Use: No  . Sexual Activity: Not on file   Other Topics Concern  . Not on file   Social History Narrative   Lives with wife of  38 years.  They have four children.  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     Past Surgical History  Procedure Laterality Date  . Total knee arthroplasty      bital  . Carpal tunnel release Right 2003  . Cataract extraction  1980    removed cataract in left eye. Currently blind in right eye  . Cholecystectomy  2001    Gall Bladder  . Retinal detachment surgery  2004  . Lumbar puncture  Oct. 21, 2014  . Nerve conduction    . Mri  07-14-13  . Carotid endarterectomy Right 08-15-07    cea  . Joint replacement  2000    Right knee replacement  . Eye surgery  [id number]    retinal detachment  . Eye surgery      left cataract surgery  . Shoulder surgery Left 2009    hx "frozen" shoulder    Family History  Problem Relation Age of Onset  . Stroke Mother 42  . Hypertension Mother   . Heart disease Father   . Aneurysm Father   . Coronary artery disease Sister   . Pneumonia Sister   . Heart disease Sister   . Diabetes Sister   .  Hypertension Sister     Allergies  Allergen Reactions  . Dopamine Anaphylaxis  . Morphine And Related Other (See Comments)    Hallucinations, very bad reactions  . Vicodin [Hydrocodone-Acetaminophen] Nausea And Vomiting    Current Outpatient Prescriptions on File Prior to Visit  Medication Sig Dispense Refill  . acetaminophen (TYLENOL) 500 MG tablet Take 500 mg by mouth every 6 (six) hours as needed for fever.    Marland Kitchen alendronate (FOSAMAX) 70 MG tablet Take 70 mg by mouth once a week. Take with a full glass of water on an empty stomach. Takes on Wednesday    . chlorpheniramine (CHLOR-TRIMETON) 4 MG tablet Take 8 mg by mouth as needed for allergies.     Marland Kitchen clopidogrel (PLAVIX) 75 MG tablet Take 1 tablet (75 mg total) by mouth daily. 90 tablet 4  . folic acid-pyridoxine-cyancobalamin (VIRT-VITE FORTE) 2.5-25-2 MG TABS Take 1 tablet by mouth once a week. Takes on days he takes methotrexate. (thursday)    . latanoprost  (XALATAN) 0.005 % ophthalmic solution Place 1 drop into the left eye at bedtime.     . methotrexate (RHEUMATREX) 2.5 MG tablet Take 10 mg by mouth once a week. Caution:Chemotherapy. Protect from light.    . Multiple Vitamins-Minerals (OCUVITE ADULT 50+) CAPS Take 1 capsule by mouth daily.    . polyethylene glycol (MIRALAX / GLYCOLAX) packet Take 17 g by mouth daily as needed (for constipation).    . Probiotic Product (PROBIOTIC DAILY PO) Take 1 tablet by mouth daily.    . timolol (TIMOPTIC) 0.5 % ophthalmic solution Place 1 drop into the left eye every evening.      No current facility-administered medications on file prior to visit.    BP 150/90 mmHg  Pulse 90  Temp(Src) 99.3 F (37.4 C) (Oral)  Resp 20  Ht 5\' 11"  (1.803 m)  Wt 175 lb (79.379 kg)  BMI 24.42 kg/m2  SpO2 98%     Review of Systems  Constitutional: Positive for fever and fatigue. Negative for chills and appetite change.  HENT: Negative for congestion, dental problem, ear pain, hearing loss, sore throat, tinnitus, trouble swallowing and voice change.   Eyes: Negative for pain, discharge and visual disturbance.  Respiratory: Negative for cough, chest tightness, wheezing and stridor.   Cardiovascular: Negative for chest pain, palpitations and leg swelling.  Gastrointestinal: Negative for nausea, vomiting, abdominal pain, diarrhea, constipation, blood in stool and abdominal distention.  Genitourinary: Positive for dysuria, urgency, frequency and difficulty urinating. Negative for hematuria, flank pain, discharge and genital sores.  Musculoskeletal: Negative for myalgias, back pain, joint swelling, arthralgias, gait problem and neck stiffness.  Skin: Negative for rash.  Neurological: Negative for dizziness, syncope, speech difficulty, weakness, numbness and headaches.  Hematological: Negative for adenopathy. Does not bruise/bleed easily.  Psychiatric/Behavioral: Negative for behavioral problems and dysphoric mood. The  patient is not nervous/anxious.        Objective:   Physical Exam  Constitutional: He is oriented to person, place, and time. He appears well-developed.  HENT:  Head: Normocephalic.  Right Ear: External ear normal.  Left Ear: External ear normal.  Eyes: Conjunctivae and EOM are normal.  Neck: Normal range of motion.  Cardiovascular: Normal rate and normal heart sounds.   Pulmonary/Chest: Breath sounds normal.  Abdominal: Bowel sounds are normal.  Genitourinary:  Prostate plus 3 symmetrically enlarged Nontender  Musculoskeletal: Normal range of motion. He exhibits no edema or tenderness.  Neurological: He is alert and oriented to person, place, and time.  Psychiatric: He has a normal mood and affect. His behavior is normal.          Assessment & Plan:   Urinary frequency with obstructive symptoms.  Probable BPH.  Will try a trial of Flomax Sensorimotor polyneuropathy.  Follow-up with neurology.  Encouraged Hypertension, stable RA.  Follow-up rheumatology  Patient has abruptly stopped prednisone after 4 months of therapy.  Will resume prednisone and start a rapid taper

## 2015-03-10 DIAGNOSIS — H40013 Open angle with borderline findings, low risk, bilateral: Secondary | ICD-10-CM | POA: Diagnosis not present

## 2015-04-05 DIAGNOSIS — D224 Melanocytic nevi of scalp and neck: Secondary | ICD-10-CM | POA: Diagnosis not present

## 2015-04-05 DIAGNOSIS — D2272 Melanocytic nevi of left lower limb, including hip: Secondary | ICD-10-CM | POA: Diagnosis not present

## 2015-04-05 DIAGNOSIS — L218 Other seborrheic dermatitis: Secondary | ICD-10-CM | POA: Diagnosis not present

## 2015-04-05 DIAGNOSIS — D225 Melanocytic nevi of trunk: Secondary | ICD-10-CM | POA: Diagnosis not present

## 2015-04-05 DIAGNOSIS — L570 Actinic keratosis: Secondary | ICD-10-CM | POA: Diagnosis not present

## 2015-04-14 ENCOUNTER — Encounter: Payer: Self-pay | Admitting: Internal Medicine

## 2015-04-14 DIAGNOSIS — M059 Rheumatoid arthritis with rheumatoid factor, unspecified: Secondary | ICD-10-CM | POA: Diagnosis not present

## 2015-04-14 DIAGNOSIS — M25441 Effusion, right hand: Secondary | ICD-10-CM | POA: Diagnosis not present

## 2015-04-14 DIAGNOSIS — M25442 Effusion, left hand: Secondary | ICD-10-CM | POA: Diagnosis not present

## 2015-04-14 DIAGNOSIS — Z79899 Other long term (current) drug therapy: Secondary | ICD-10-CM | POA: Diagnosis not present

## 2015-04-19 DIAGNOSIS — H4011X4 Primary open-angle glaucoma, indeterminate stage: Secondary | ICD-10-CM | POA: Diagnosis not present

## 2015-05-03 ENCOUNTER — Ambulatory Visit (INDEPENDENT_AMBULATORY_CARE_PROVIDER_SITE_OTHER): Payer: Medicare Other | Admitting: Internal Medicine

## 2015-05-03 ENCOUNTER — Encounter: Payer: Self-pay | Admitting: Internal Medicine

## 2015-05-03 VITALS — BP 130/94 | HR 64 | Temp 98.4°F | Ht 71.0 in | Wt 176.0 lb

## 2015-05-03 DIAGNOSIS — E785 Hyperlipidemia, unspecified: Secondary | ICD-10-CM

## 2015-05-03 DIAGNOSIS — M0510 Rheumatoid lung disease with rheumatoid arthritis of unspecified site: Secondary | ICD-10-CM | POA: Diagnosis not present

## 2015-05-03 DIAGNOSIS — Z0000 Encounter for general adult medical examination without abnormal findings: Secondary | ICD-10-CM | POA: Diagnosis not present

## 2015-05-03 DIAGNOSIS — I10 Essential (primary) hypertension: Secondary | ICD-10-CM | POA: Diagnosis not present

## 2015-05-03 DIAGNOSIS — M069 Rheumatoid arthritis, unspecified: Secondary | ICD-10-CM

## 2015-05-03 DIAGNOSIS — I714 Abdominal aortic aneurysm, without rupture, unspecified: Secondary | ICD-10-CM

## 2015-05-03 DIAGNOSIS — G629 Polyneuropathy, unspecified: Secondary | ICD-10-CM

## 2015-05-03 DIAGNOSIS — G1221 Amyotrophic lateral sclerosis: Secondary | ICD-10-CM

## 2015-05-03 LAB — LIPID PANEL
Cholesterol: 152 mg/dL (ref 0–200)
HDL: 34.5 mg/dL — AB (ref >39.00)
LDL Cholesterol: 101 mg/dL — ABNORMAL HIGH (ref 0–99)
NonHDL: 117.37
Total CHOL/HDL Ratio: 4
Triglycerides: 80 mg/dL (ref 0.0–149.0)
VLDL: 16 mg/dL (ref 0.0–40.0)

## 2015-05-03 LAB — TSH: TSH: 1.8 u[IU]/mL (ref 0.35–4.50)

## 2015-05-03 LAB — CBC WITH DIFFERENTIAL/PLATELET
BASOS PCT: 0.4 % (ref 0.0–3.0)
Basophils Absolute: 0 10*3/uL (ref 0.0–0.1)
Eosinophils Absolute: 0.3 10*3/uL (ref 0.0–0.7)
Eosinophils Relative: 4.3 % (ref 0.0–5.0)
HEMATOCRIT: 39.6 % (ref 39.0–52.0)
HEMOGLOBIN: 13 g/dL (ref 13.0–17.0)
Lymphocytes Relative: 29.1 % (ref 12.0–46.0)
Lymphs Abs: 2.3 10*3/uL (ref 0.7–4.0)
MCHC: 32.7 g/dL (ref 30.0–36.0)
MCV: 94.5 fl (ref 78.0–100.0)
MONO ABS: 0.6 10*3/uL (ref 0.1–1.0)
MONOS PCT: 7.9 % (ref 3.0–12.0)
NEUTROS ABS: 4.5 10*3/uL (ref 1.4–7.7)
Neutrophils Relative %: 58.3 % (ref 43.0–77.0)
Platelets: 196 10*3/uL (ref 150.0–400.0)
RBC: 4.2 Mil/uL — ABNORMAL LOW (ref 4.22–5.81)
RDW: 15.8 % — ABNORMAL HIGH (ref 11.5–15.5)
WBC: 7.7 10*3/uL (ref 4.0–10.5)

## 2015-05-03 LAB — COMPREHENSIVE METABOLIC PANEL
ALK PHOS: 49 U/L (ref 39–117)
ALT: 23 U/L (ref 0–53)
AST: 29 U/L (ref 0–37)
Albumin: 3.8 g/dL (ref 3.5–5.2)
BUN: 14 mg/dL (ref 6–23)
CO2: 27 mEq/L (ref 19–32)
CREATININE: 1.34 mg/dL (ref 0.40–1.50)
Calcium: 9.3 mg/dL (ref 8.4–10.5)
Chloride: 105 mEq/L (ref 96–112)
GFR: 55.61 mL/min — ABNORMAL LOW (ref >60.00)
Glucose, Bld: 107 mg/dL — ABNORMAL HIGH (ref 70–99)
Potassium: 4.8 mEq/L (ref 3.5–5.1)
Sodium: 139 mEq/L (ref 135–145)
Total Bilirubin: 0.9 mg/dL (ref 0.2–1.2)
Total Protein: 7.7 g/dL (ref 6.0–8.3)

## 2015-05-03 NOTE — Progress Notes (Signed)
Subjective:    Patient ID: Brett Castaneda, male    DOB: 1941/02/06, 74 y.o.   MRN: [id number]  HPI  74 year old patient who is seen today for a preventive health exam. 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  Over the past year, he and his wife both feel that he has improved.  He no longer uses a walker and uses a cane only occasionally.  He is followed by rheumatology for RA and has secondary interstitial lung disease.  His pulmonary status appears to be stable.  He is also followed by vascular surgery and is scheduled for carotid artery Doppler studies as well as follow-up of a AAA in November of this year.  He is status post right CEA A nuclear stress test was performed in August 2014  Last colonoscopy 2006.  He has been contacted by GI for follow-up colonoscopy  Past Medical History  Diagnosis Date  . BASAL CELL CARCINOMA, FACE 03/31/2009  . 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 CONSTIPATION 06/22/2008  . DIVERTICULOSIS, COLON 03/24/2007  . GERD 03/24/2007  . HYPERLIPIDEMIA 08/27/2007  . HYPERTENSION 12/14/2008  . Rheumatoid arthritis(714.0) 03/24/2007  . Blind right eye   . Retinal detachment     hx of  . Keratosis Oct. 2013  . Fall at home Nov. 5, 2014  06-28-13    in the home and Outside as well  . 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 Stroke Oct 2014  . 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 Marital Status: Married    Spouse Name: N/A  . Number of Children: N/A  . Years of Education: N/A   Occupational History  . Not on file.   Social History Main Topics  . Smoking status: Former Smoker -- 23 years    Types: Cigarettes    Quit date: 09/24/1980  . Smokeless tobacco: Never Used  . Alcohol  Use: No  . Drug Use: No  . Sexual Activity: Not on file   Other Topics Concern  . Not on file   Social History Narrative   Lives with wife of 52 years.  They have four children.  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     Past Surgical History  Procedure Laterality Date  . Total knee arthroplasty      bital  . Carpal tunnel release Right 2003  . Cataract extraction  1980    removed cataract in left eye. Currently blind in right eye  . Cholecystectomy  2001    Gall Bladder  . Retinal detachment surgery  2004  . Lumbar puncture  Oct. 21, 2014  . Nerve conduction    . Mri  07-14-13  . Carotid endarterectomy Right 08-15-07    cea  . Joint replacement  2000    Right knee replacement  . Eye surgery  [id number]    retinal detachment  . Eye surgery      left cataract surgery  . Shoulder surgery Left 2009    hx "frozen" shoulder    Family History  Problem Relation Age of Onset  . Stroke Mother 28  . Hypertension Mother   . Heart disease Father   .  Aneurysm Father   . Coronary artery disease Sister   . Pneumonia Sister   . Heart disease Sister   . Diabetes Sister   . Hypertension Sister     Allergies  Allergen Reactions  . Dopamine Anaphylaxis  . Morphine And Related Other (See Comments)    Hallucinations, very bad reactions  . Vicodin [Hydrocodone-Acetaminophen] Nausea And Vomiting    Current Outpatient Prescriptions on File Prior to Visit  Medication Sig Dispense Refill  . acetaminophen (TYLENOL) 500 MG tablet Take 500 mg by mouth every 6 (six) hours as needed for fever.    Marland Kitchen alendronate (FOSAMAX) 70 MG tablet Take 70 mg by mouth once a week. Take with a full glass of water on an empty stomach. Takes on Wednesday    . chlorpheniramine (CHLOR-TRIMETON) 4 MG tablet Take 8 mg by mouth as needed for allergies.     Marland Kitchen clopidogrel (PLAVIX) 75 MG tablet Take 1 tablet (75 mg total) by mouth daily. 90 tablet 4  . folic  acid-pyridoxine-cyancobalamin (VIRT-VITE FORTE) 2.5-25-2 MG TABS Take 1 tablet by mouth once a week. Takes on days he takes methotrexate. (thursday)    . latanoprost (XALATAN) 0.005 % ophthalmic solution Place 1 drop into the left eye at bedtime.     . methotrexate (RHEUMATREX) 2.5 MG tablet Take 10 mg by mouth once a week. Caution:Chemotherapy. Protect from light.    . Multiple Vitamins-Minerals (OCUVITE ADULT 50+) CAPS Take 1 capsule by mouth daily.    . polyethylene glycol (MIRALAX / GLYCOLAX) packet Take 17 g by mouth daily as needed (for constipation).    . Probiotic Product (PROBIOTIC DAILY PO) Take 1 tablet by mouth daily.    . tamsulosin (FLOMAX) 0.4 MG CAPS capsule Take 1 capsule (0.4 mg total) by mouth daily. 30 capsule 3  . timolol (TIMOPTIC) 0.5 % ophthalmic solution Place 1 drop into the left eye every evening.      No current facility-administered medications on file prior to visit.    BP 130/94 mmHg  Pulse 64  Temp(Src) 98.4 F (36.9 C) (Oral)  Ht 5\' 11"  (1.803 m)  Wt 176 lb (79.833 kg)  BMI 24.56 kg/m2  SpO2 94%  1. Risk factors, based on past  M,S,F history.  Cardio risk factors include hypertension and dyslipidemia.  He has peripheral vascular disease status post right CEA and a history of a 3 cm AAA  2.  Physical activities: Limited due to lower extremity weakness, but does ride on a stationary bike 3-4 times per week for about 10 minutes  3.  Depression/mood: No history of major depression   4.  Hearing: No significant deficits  5.  ADL's: Independent.  Does not drive  6.  Fall risk: Moderate due to lower extremity weakness and mild bilateral foot drop  7.  Home safety: No problems identified  8.  Height weight, and visual acuity; height and weight stable no change in visual acuity is followed by ophthalmology annually  9.  Counseling: Heart healthy diet recommended  10. Lab orders based on risk factors: Laboratory profile including lipid panel will be  reviewed  11. Referral : Follow-up rheumatology, ophthalmology and neurology  12. Care plan: We'll continue efforts at aggressive risk factor modification.  Heart healthy diet.  Encouraged.  Follow-up carotid artery Doppler studies.  Colonoscopy and monitoring of aortic aneurysm.  13. Cognitive assessment: Alert and oriented with normal affect.  No cognitive dysfunction  14. Screening: Patient provided with a written and personalized 5-10  year screening schedule in the AVS.  .  We'll continue annual vascular screens due to his carotid artery and AAA.  Follow colonoscopy scheduled and will be continued at 10 year intervals.  Annual eye examinations recommended.  Will have annual clinical exams with screening lab  15. Provider List Update: Primary care rheumatology, ophthalmology and neurology       Review of Systems  Constitutional: Negative for fever, chills, appetite change and fatigue.  HENT: Negative for congestion, dental problem, ear pain, hearing loss, sore throat, tinnitus, trouble swallowing and voice change.   Eyes: Negative for pain, discharge and visual disturbance.  Respiratory: Negative for cough, chest tightness, wheezing and stridor.   Cardiovascular: Negative for chest pain, palpitations and leg swelling.  Gastrointestinal: Negative for nausea, vomiting, abdominal pain, diarrhea, constipation, blood in stool and abdominal distention.  Genitourinary: Negative for urgency, hematuria, flank pain, discharge, difficulty urinating and genital sores.  Musculoskeletal: Positive for gait problem. Negative for myalgias, back pain, joint swelling, arthralgias and neck stiffness.  Skin: Negative for rash.  Neurological: Positive for weakness. Negative for dizziness, syncope, speech difficulty, numbness and headaches.  Hematological: Negative for adenopathy. Does not bruise/bleed easily.  Psychiatric/Behavioral: Negative for behavioral problems and dysphoric mood. The patient is not  nervous/anxious.        Objective:   Physical Exam  Constitutional: He appears well-developed and well-nourished.  Blood pressure 134/84  HENT:  Head: Normocephalic and atraumatic.  Right Ear: External ear normal.  Left Ear: External ear normal.  Nose: Nose normal.  Mouth/Throat: Oropharynx is clear and moist.  Eyes: Conjunctivae and EOM are normal. Pupils are equal, round, and reactive to light. No scleral icterus.  External strabismus, right eye  Neck: Normal range of motion. Neck supple. No JVD present. No thyromegaly present.  Status post right CEA  Cardiovascular: Regular rhythm, normal heart sounds and intact distal pulses.  Exam reveals no gallop and no friction rub.   No murmur heard. Pulmonary/Chest: Effort normal and breath sounds normal. He exhibits no tenderness.  Abdominal: Soft. Bowel sounds are normal. He exhibits no distension and no mass. There is no tenderness.  Genitourinary: Prostate normal and penis normal.  Musculoskeletal: Normal range of motion. He exhibits no edema or tenderness.  Status post bilateral total knee replacement surgery  Lymphadenopathy:    He has no cervical adenopathy.  Neurological: He is alert. He has normal reflexes. No cranial nerve deficit. Coordination normal.  There extremity weakness and some muscle atrophy  Bilateral weakness, dorsiflexion, right slightly greater than left  Patellar reflexes brisk, absent Achilles reflexes  Skin: Skin is warm and dry. No rash noted.  Psychiatric: He has a normal mood and affect. His behavior is normal.          Assessment & Plan:   Preventive health examination Sensorimotor neuropathy Essential hypertension History of cerebral vascular disease Status post right CEA History of 3 cm aortic aneurysm.  Follow-up scheduled for November Dyslipidemia RA Interstitial lung disease

## 2015-05-03 NOTE — Patient Instructions (Addendum)
Rheumatology and neurology follow-up  Limit your sodium (Salt) intake  Schedule your colonoscopy to help detect colon cancer.  Return in 6 months for follow-up  Please check your blood pressure on a regular basis.  If it is consistently greater than 150/90, please make an office appointment.  Health Maintenance A healthy lifestyle and preventative care can promote health and wellness.  Maintain regular health, dental, and eye exams.  Eat a healthy diet. Foods like vegetables, fruits, whole grains, low-fat dairy products, and lean protein foods contain the nutrients you need and are low in calories. Decrease your intake of foods high in solid fats, added sugars, and salt. Get information about a proper diet from your health care provider, if necessary.  Regular physical exercise is one of the most important things you can do for your health. Most adults should get at least 150 minutes of moderate-intensity exercise (any activity that increases your heart rate and causes you to sweat) each week. In addition, most adults need muscle-strengthening exercises on 2 or more days a week.   Maintain a healthy weight. The body mass index (BMI) is a screening tool to identify possible weight problems. It provides an estimate of body fat based on height and weight. Your health care provider can find your BMI and can help you achieve or maintain a healthy weight. For males 20 years and older:  A BMI below 18.5 is considered underweight.  A BMI of 18.5 to 24.9 is normal.  A BMI of 25 to 29.9 is considered overweight.  A BMI of 30 and above is considered obese.  Maintain normal blood lipids and cholesterol by exercising and minimizing your intake of saturated fat. Eat a balanced diet with plenty of fruits and vegetables. Blood tests for lipids and cholesterol should begin at age 58 and be repeated every 5 years. If your lipid or cholesterol levels are high, you are over age 69, or you are at high risk  for heart disease, you may need your cholesterol levels checked more frequently.Ongoing high lipid and cholesterol levels should be treated with medicines if diet and exercise are not working.  If you smoke, find out from your health care provider how to quit. If you do not use tobacco, do not start.  Lung cancer screening is recommended for adults aged 48-80 years who are at high risk for developing lung cancer because of a history of smoking. A yearly low-dose CT scan of the lungs is recommended for people who have at least a 30-pack-year history of smoking and are current smokers or have quit within the past 15 years. A pack year of smoking is smoking an average of 1 pack of cigarettes a day for 1 year (for example, a 30-pack-year history of smoking could mean smoking 1 pack a day for 30 years or 2 packs a day for 15 years). Yearly screening should continue until the smoker has stopped smoking for at least 15 years. Yearly screening should be stopped for people who develop a health problem that would prevent them from having lung cancer treatment.  If you choose to drink alcohol, do not have more than 2 drinks per day. One drink is considered to be 12 oz (360 mL) of beer, 5 oz (150 mL) of wine, or 1.5 oz (45 mL) of liquor.  Avoid the use of street drugs. Do not share needles with anyone. Ask for help if you need support or instructions about stopping the use of drugs.  High blood  pressure causes heart disease and increases the risk of stroke. Blood pressure should be checked at least every 1-2 years. Ongoing high blood pressure should be treated with medicines if weight loss and exercise are not effective.  If you are 23-20 years old, ask your health care provider if you should take aspirin to prevent heart disease.  Diabetes screening involves taking a blood sample to check your fasting blood sugar level. This should be done once every 3 years after age 33 if you are at a normal weight and without  risk factors for diabetes. Testing should be considered at a younger age or be carried out more frequently if you are overweight and have at least 1 risk factor for diabetes.  Colorectal cancer can be detected and often prevented. Most routine colorectal cancer screening begins at the age of 68 and continues through age 32. However, your health care provider may recommend screening at an earlier age if you have risk factors for colon cancer. On a yearly basis, your health care provider may provide home test kits to check for hidden blood in the stool. A small camera at the end of a tube may be used to directly examine the colon (sigmoidoscopy or colonoscopy) to detect the earliest forms of colorectal cancer. Talk to your health care provider about this at age 108 when routine screening begins. A direct exam of the colon should be repeated every 5-10 years through age 91, unless early forms of precancerous polyps or small growths are found.  People who are at an increased risk for hepatitis B should be screened for this virus. You are considered at high risk for hepatitis B if:  You were born in a country where hepatitis B occurs often. Talk with your health care provider about which countries are considered high risk.  Your parents were born in a high-risk country and you have not received a shot to protect against hepatitis B (hepatitis B vaccine).  You have HIV or AIDS.  You use needles to inject street drugs.  You live with, or have sex with, someone who has hepatitis B.  You are a man who has sex with other men (MSM).  You get hemodialysis treatment.  You take certain medicines for conditions like cancer, organ transplantation, and autoimmune conditions.  Hepatitis C blood testing is recommended for all people born from [birth year] through 1963 and any individual with known risk factors for hepatitis C.  Healthy men should no longer receive prostate-specific antigen (PSA) blood tests as part of  routine cancer screening. Talk to your health care provider about prostate cancer screening.  Testicular cancer screening is not recommended for adolescents or adult males who have no symptoms. Screening includes self-exam, a health care provider exam, and other screening tests. Consult with your health care provider about any symptoms you have or any concerns you have about testicular cancer.  Practice safe sex. Use condoms and avoid high-risk sexual practices to reduce the spread of sexually transmitted infections (STIs).  You should be screened for STIs, including gonorrhea and chlamydia if:  You are sexually active and are younger than 24 years.  You are older than 24 years, and your health care provider tells you that you are at risk for this type of infection.  Your sexual activity has changed since you were last screened, and you are at an increased risk for chlamydia or gonorrhea. Ask your health care provider if you are at risk.  If you are at risk  of being infected with HIV, it is recommended that you take a prescription medicine daily to prevent HIV infection. This is called pre-exposure prophylaxis (PrEP). You are considered at risk if:  You are a man who has sex with other men (MSM).  You are a heterosexual man who is sexually active with multiple partners.  You take drugs by injection.  You are sexually active with a partner who has HIV.  Talk with your health care provider about whether you are at high risk of being infected with HIV. If you choose to begin PrEP, you should first be tested for HIV. You should then be tested every 3 months for as long as you are taking PrEP.  Use sunscreen. Apply sunscreen liberally and repeatedly throughout the day. You should seek shade when your shadow is shorter than you. Protect yourself by wearing long sleeves, pants, a wide-brimmed hat, and sunglasses year round whenever you are outdoors.  Tell your health care provider of new moles  or changes in moles, especially if there is a change in shape or color. Also, tell your health care provider if a mole is larger than the size of a pencil eraser.  A one-time screening for abdominal aortic aneurysm (AAA) and surgical repair of large AAAs by ultrasound is recommended for men aged 55-75 years who are current or former smokers.  Stay current with your vaccines (immunizations). Document Released: 03/08/2008 Document Revised: 09/15/2013 Document Reviewed: 02/05/2011 [facility] Patient Information 2015 [place], [place]. This information is not intended to replace advice given to you by your health care provider. Make sure you discuss any questions you have with your health care provider.

## 2015-05-03 NOTE — Progress Notes (Signed)
Pre visit review using our clinic review tool, if applicable. No additional management support is needed unless otherwise documented below in the visit note. 

## 2015-05-05 ENCOUNTER — Encounter: Payer: Self-pay | Admitting: Internal Medicine

## 2015-05-16 ENCOUNTER — Telehealth: Payer: Self-pay | Admitting: Internal Medicine

## 2015-05-16 NOTE — Telephone Encounter (Signed)
Pt's wife calling for lab results done 8/8. Have not heard anything.

## 2015-05-16 NOTE — Telephone Encounter (Signed)
Please call/notify patient that lab/test/procedure is normal; cholesterol 152

## 2015-05-16 NOTE — Telephone Encounter (Signed)
Left message on voicemail to call office.  

## 2015-05-16 NOTE — Telephone Encounter (Signed)
Pt calling for lab results from 8/9, please advise.

## 2015-05-17 NOTE — Telephone Encounter (Signed)
Spoke to [place] pt's wife told her labs result were normal, Cholesterol was 152 per Dr.K. [name] verbalized understanding and will let pt know.

## 2015-06-07 DIAGNOSIS — L570 Actinic keratosis: Secondary | ICD-10-CM | POA: Diagnosis not present

## 2015-06-07 DIAGNOSIS — L218 Other seborrheic dermatitis: Secondary | ICD-10-CM | POA: Diagnosis not present

## 2015-06-30 ENCOUNTER — Encounter: Payer: Self-pay | Admitting: Internal Medicine

## 2015-06-30 ENCOUNTER — Ambulatory Visit (INDEPENDENT_AMBULATORY_CARE_PROVIDER_SITE_OTHER): Payer: Medicare Other | Admitting: Internal Medicine

## 2015-06-30 VITALS — BP 130/82 | HR 80 | Ht 71.5 in | Wt 174.4 lb

## 2015-06-30 DIAGNOSIS — Z7902 Long term (current) use of antithrombotics/antiplatelets: Secondary | ICD-10-CM | POA: Diagnosis not present

## 2015-06-30 DIAGNOSIS — Z8673 Personal history of transient ischemic attack (TIA), and cerebral infarction without residual deficits: Secondary | ICD-10-CM | POA: Diagnosis not present

## 2015-06-30 DIAGNOSIS — Z1211 Encounter for screening for malignant neoplasm of colon: Secondary | ICD-10-CM | POA: Diagnosis not present

## 2015-06-30 NOTE — Progress Notes (Signed)
HISTORY OF PRESENT ILLNESS:  Brett Castaneda is a 74 y.o. male with multiple significant medical problems who presents today regarding repeat screening colonoscopy after receiving a routine recall letter through the recall program. He is accompanied today by his wife. Patient underwent routine screening colonoscopy 06/01/2005. Examination revealed left-sided diverticulosis but was otherwise normal. The examination was complete with excellent preparation. His significant medical problems include history of prior strokes for which he is undergone carotid endarterectomy and is on chronic Plavix. There was also a question of ALS, but upon second opinion at tertiary care center was felt to represent neuropathy. This is been stable over the past 2 years. He also has problems with balance, right eye blindness, and hearing impairment. GI review of systems is entirely negative. There has been no interval family history of colon cancer.  REVIEW OF SYSTEMS:  All non-GI ROS negative except for arthritis, sinus and allergy, neuropathy  Past Medical History  Diagnosis Date  . BASAL CELL CARCINOMA, FACE 03/31/2009  . 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 CONSTIPATION 06/22/2008  . DIVERTICULOSIS, COLON 03/24/2007  . GERD 03/24/2007  . HYPERLIPIDEMIA 08/27/2007  . HYPERTENSION 12/14/2008  . Rheumatoid arthritis(714.0) 03/24/2007  . Blind right eye   . Retinal detachment     hx of  . Keratosis Oct. 2013  . Fall at home Nov. 5, 2014  06-28-13    in the home and Outside as well  . 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 Stroke [facility]) Oct 2014  . ALS (amyotrophic lateral sclerosis) ([place]) dx jan. 14, 2015    symptons consistent, 2nd opinion at [facility] at 11/09/13    Past Surgical History  Procedure Laterality Date  . Total knee arthroplasty      bital  . Carpal tunnel release Right 2003  . Cataract extraction  1980    removed cataract in left eye. Currently blind in  right eye  . Cholecystectomy  2001    Gall Bladder  . Retinal detachment surgery  2004  . Lumbar puncture  Oct. 21, 2014  . Nerve conduction    . Mri  07-14-13  . Carotid endarterectomy Right 08-15-07    cea  . Joint replacement  2000    Right knee replacement  . Eye surgery  [id number]    retinal detachment  . Eye surgery      left cataract surgery  . Shoulder surgery Left 2009    hx "frozen" shoulder    Social History Brett Castaneda  reports that he quit smoking about 34 years ago. His smoking use included Cigarettes. He quit after 23 years of use. He has never used smokeless tobacco. He reports that he does not drink alcohol or use illicit drugs.  family history includes Aneurysm in his father; Coronary artery disease in his sister; Diabetes in his sister; Heart disease in his father and sister; Hypertension in his mother and sister; Pneumonia in his sister; Stroke (age of onset: 40) in his mother. There is no history of Colon cancer.  Allergies  Allergen Reactions  . Dopamine Anaphylaxis  . Morphine And Related Other (See Comments)    Hallucinations, very bad reactions  . Vicodin [Hydrocodone-Acetaminophen] Nausea And Vomiting       PHYSICAL EXAMINATION: Vital signs: BP 130/82 mmHg  Pulse 80  Ht 5' 11.5" (1.816 m)  Wt 174 lb 6.4 oz (79.107 kg)  BMI 23.99 kg/m2  Constitutional: Chronically ill-appearing,  unsteady, no acute distress Psychiatric: alert and oriented x3, cooperative Eyes: extraocular movements intact, anicteric, conjunctiva pink Mouth: oral pharynx moist, no lesions Neck: supple no lymphadenopathy Cardiovascular: heart regular rate and rhythm, no murmur Lungs: clear to auscultation bilaterally Abdomen: soft, nontender, nondistended, no obvious ascites, no peritoneal signs, normal bowel sounds, no organomegaly Rectal: Omitted Extremities: no clubbing cyanosis or lower extremity edema bilaterally Skin: no lesions on visible extremities Neuro: Cranial  nerves intact.    ASSESSMENT:  #1. Colon cancer screening. Patient is high risk due to his comorbidities and the need for chronic Plavix. Asymptomatic   PLAN:  #1. We discussed options in detail regarding colon cancer screening. In this particular patient I recommend cologard is an initial screening test. If positive we can discuss optical or virtual colonoscopy thereafter.  Proximally 30 minutes, overwhelming majority of time regarding counseling, was spent with the patient and his wife regarding colon cancer screening and alternative strategies. Multiple questions answered to their satisfaction.

## 2015-06-30 NOTE — Patient Instructions (Signed)
You will receive a phone call from [employer] to help you get started using the Cologuard kit

## 2015-07-12 DIAGNOSIS — Z1212 Encounter for screening for malignant neoplasm of rectum: Secondary | ICD-10-CM | POA: Diagnosis not present

## 2015-07-12 DIAGNOSIS — Z1211 Encounter for screening for malignant neoplasm of colon: Secondary | ICD-10-CM | POA: Diagnosis not present

## 2015-07-12 LAB — COLOGUARD: COLOGUARD: NEGATIVE

## 2015-08-02 ENCOUNTER — Telehealth: Payer: Self-pay

## 2015-08-02 NOTE — Telephone Encounter (Signed)
Printed Cologuard results and put on Dr. Blanch Media desk

## 2015-08-11 ENCOUNTER — Ambulatory Visit (INDEPENDENT_AMBULATORY_CARE_PROVIDER_SITE_OTHER): Payer: Medicare Other | Admitting: Family Medicine

## 2015-08-11 ENCOUNTER — Encounter: Payer: Self-pay | Admitting: Family Medicine

## 2015-08-11 ENCOUNTER — Ambulatory Visit: Payer: Self-pay | Admitting: Family

## 2015-08-11 ENCOUNTER — Encounter (HOSPITAL_COMMUNITY): Payer: Self-pay

## 2015-08-11 ENCOUNTER — Other Ambulatory Visit (HOSPITAL_COMMUNITY): Payer: Self-pay

## 2015-08-11 VITALS — BP 142/82 | HR 67 | Temp 98.2°F | Ht 71.5 in

## 2015-08-11 DIAGNOSIS — L309 Dermatitis, unspecified: Secondary | ICD-10-CM

## 2015-08-11 DIAGNOSIS — L03114 Cellulitis of left upper limb: Secondary | ICD-10-CM

## 2015-08-11 MED ORDER — CEPHALEXIN 500 MG PO CAPS: 500.0000 mg | ORAL_CAPSULE | Freq: Three times a day (TID) | ORAL | Status: DC

## 2015-08-11 NOTE — Progress Notes (Signed)
Pre visit review using our clinic review tool, if applicable. No additional management support is needed unless otherwise documented below in the visit note. Patient declines weight measurement today. 

## 2015-08-11 NOTE — Patient Instructions (Signed)
Please start the antibiotic right away  Monitor area closely - if worsening, fevers, malaise or other concerns seek care immediately  Please follow your dermatologists recommendations and follow up with your dermatologist regarding your eczema and if the area doe snot heal as expected

## 2015-08-11 NOTE — Progress Notes (Addendum)
HPI:  Acute visit for:  Skin Rash: -red patch of skin on L forearm started as small spot about 1 week ago but has spread -mildly itchy and warm -denies: pain, fevers, chills, malaise -see dermatologist for "dry skin" but wife reports does not use his creams as instructed so is always scratching  ROS: See pertinent positives and negatives per HPI.  Past Medical History  Diagnosis Date  . BASAL CELL CARCINOMA, FACE 03/31/2009  . 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 CONSTIPATION 06/22/2008  . DIVERTICULOSIS, COLON 03/24/2007  . GERD 03/24/2007  . HYPERLIPIDEMIA 08/27/2007  . HYPERTENSION 12/14/2008  . Rheumatoid arthritis(714.0) 03/24/2007  . Blind right eye   . Retinal detachment     hx of  . Keratosis Oct. 2013  . Fall at home Nov. 5, 2014  06-28-13    in the home and Outside as well  . 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 Stroke [facility]) Oct 2014  . 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 Total knee arthroplasty      bital  . Carpal tunnel release Right 2003  . Cataract extraction  1980    removed cataract in left eye. Currently blind in right eye  . Cholecystectomy  2001    Gall Bladder  . Retinal detachment surgery  2004  . Lumbar puncture  Oct. 21, 2014  . Nerve conduction    . Mri  07-14-13  . Carotid endarterectomy Right 08-15-07    cea  . Joint replacement  2000    Right knee replacement  . Eye surgery  [id number]    retinal detachment  . Eye surgery      left cataract surgery  . Shoulder surgery Left 2009    hx "frozen" shoulder    Family History  Problem Relation Age of Onset  . Stroke Mother 39  . Hypertension Mother   . Heart disease Father   . Aneurysm Father   . Coronary artery disease Sister   . Pneumonia Sister   . Heart disease Sister   . Diabetes Sister   .  Hypertension Sister   . Colon cancer Neg Hx     Social History   Social History  . Marital Status: Married    Spouse Name: [name]  . Number of Children: 4  . Years of Education: N/A   Occupational History  . retired    Social History Main Topics  . Smoking status: Former Smoker -- 23 years    Types: Cigarettes    Quit date: 09/24/1980  . Smokeless tobacco: Never Used  . Alcohol Use: No  . Drug Use: No  . Sexual Activity: Not Asked   Other Topics Concern  . None   Social History Narrative   Lives with wife of 44 years.  They have four children.  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      Current outpatient prescriptions:  .  acetaminophen (TYLENOL) 500 MG tablet, Take 500 mg by mouth every 6 (six) hours as needed for fever., Disp: , Rfl:  .  alendronate (FOSAMAX) 70 MG tablet, Take 70 mg by mouth once a week. Take with a full glass of water on an empty stomach. Takes on Wednesday, Disp: , Rfl:  .  chlorpheniramine (CHLOR-TRIMETON) 4 MG tablet, Take 8 mg by mouth as needed for allergies. , Disp: , Rfl:  .  clopidogrel (PLAVIX) 75 MG tablet, Take 1 tablet (75 mg total) by mouth daily., Disp: 90 tablet, Rfl: 4 .  folic acid-pyridoxine-cyancobalamin (VIRT-VITE FORTE) 2.5-25-2 MG TABS, Take 1 tablet by mouth once a week. Takes on days he takes methotrexate. (thursday), Disp: , Rfl:  .  latanoprost (XALATAN) 0.005 % ophthalmic solution, Place 1 drop into the left eye at bedtime. , Disp: , Rfl:  .  methotrexate (RHEUMATREX) 2.5 MG tablet, Take 10 mg by mouth once a week. Caution:Chemotherapy. Protect from light., Disp: , Rfl:  .  Multiple Vitamins-Minerals (OCUVITE ADULT 50+) CAPS, Take 1 capsule by mouth daily., Disp: , Rfl:  .  polyethylene glycol (MIRALAX / GLYCOLAX) packet, Take 17 g by mouth daily as needed (for constipation)., Disp: , Rfl:  .  Probiotic Product (PROBIOTIC DAILY PO), Take 1 tablet by mouth daily., Disp: , Rfl:  .  timolol  (TIMOPTIC) 0.5 % ophthalmic solution, Place 1 drop into the left eye every evening. , Disp: , Rfl:  .  cephALEXin (KEFLEX) 500 MG capsule, Take 1 capsule (500 mg total) by mouth 3 (three) times daily., Disp: 15 capsule, Rfl: 0  EXAM:  Filed Vitals:   08/11/15 1037  BP: 142/82  Pulse: 67  Temp: 98.2 F (36.8 C)    There is no weight on file to calculate BMI.  GENERAL: vitals reviewed and listed above, alert, oriented, appears well hydrated and in no acute distress  HEENT: atraumatic, conjunttiva clear, no obvious abnormalities on inspection of external nose and ears  NECK: no obvious masses on inspection  SKIN: dry skin throughout with scattered excoriations forearms and back, he has a patch of erythematous, warm skin over most of the L forearm - marked today with skin marker  MS: moves all extremities without noticeable abnormality  PSYCH: pleasant and cooperative, no obvious depression or anxiety  ASSESSMENT AND PLAN:  Discussed the following assessment and plan:  Cellulitis of left upper extremity  Eczema  -start abx for non-purulent cellulitis, no hx MRSA -advised they monitor closely and seek care immediatly if worsening, spreading, not improving or other concerns -bp mildly elevated, they report is usually higher at office visits, advised follow up with PCP to address once feeling better from acute infection -Patient advised to return or notify a doctor immediately if symptoms worsen or persist or new concerns arise.  Patient Instructions  Please start the antibiotic right away  Monitor area closely - if worsening, fevers, malaise or other concerns seek care immediately  Please follow your dermatologists recommendations and follow up with your dermatologist regarding your eczema and if the area doe snot heal as expected      KIM, HANNAH R.

## 2015-08-17 DIAGNOSIS — M25431 Effusion, right wrist: Secondary | ICD-10-CM | POA: Diagnosis not present

## 2015-08-17 DIAGNOSIS — M542 Cervicalgia: Secondary | ICD-10-CM | POA: Diagnosis not present

## 2015-08-17 DIAGNOSIS — M0570 Rheumatoid arthritis with rheumatoid factor of unspecified site without organ or systems involvement: Secondary | ICD-10-CM | POA: Diagnosis not present

## 2015-08-19 ENCOUNTER — Encounter (HOSPITAL_COMMUNITY): Payer: Self-pay | Admitting: Emergency Medicine

## 2015-08-19 ENCOUNTER — Emergency Department (HOSPITAL_COMMUNITY): Payer: Medicare Other

## 2015-08-19 ENCOUNTER — Inpatient Hospital Stay (HOSPITAL_COMMUNITY)
Admission: EM | Admit: 2015-08-19 | Discharge: 2015-08-23 | DRG: 065 | Disposition: A | Discharge status: Home / Self Care | Payer: Medicare Other | Attending: Internal Medicine | Admitting: Internal Medicine

## 2015-08-19 DIAGNOSIS — E871 Hypo-osmolality and hyponatremia: Secondary | ICD-10-CM | POA: Diagnosis present

## 2015-08-19 DIAGNOSIS — E785 Hyperlipidemia, unspecified: Secondary | ICD-10-CM | POA: Diagnosis not present

## 2015-08-19 DIAGNOSIS — M0510 Rheumatoid lung disease with rheumatoid arthritis of unspecified site: Secondary | ICD-10-CM | POA: Diagnosis present

## 2015-08-19 DIAGNOSIS — D649 Anemia, unspecified: Secondary | ICD-10-CM | POA: Diagnosis not present

## 2015-08-19 DIAGNOSIS — Z9049 Acquired absence of other specified parts of digestive tract: Secondary | ICD-10-CM

## 2015-08-19 DIAGNOSIS — Z9842 Cataract extraction status, left eye: Secondary | ICD-10-CM

## 2015-08-19 DIAGNOSIS — Z833 Family history of diabetes mellitus: Secondary | ICD-10-CM

## 2015-08-19 DIAGNOSIS — I639 Cerebral infarction, unspecified: Secondary | ICD-10-CM | POA: Diagnosis not present

## 2015-08-19 DIAGNOSIS — I6340 Cerebral infarction due to embolism of unspecified cerebral artery: Principal | ICD-10-CM | POA: Diagnosis present

## 2015-08-19 DIAGNOSIS — Z96651 Presence of right artificial knee joint: Secondary | ICD-10-CM | POA: Diagnosis not present

## 2015-08-19 DIAGNOSIS — R509 Fever, unspecified: Secondary | ICD-10-CM | POA: Diagnosis not present

## 2015-08-19 DIAGNOSIS — I6320 Cerebral infarction due to unspecified occlusion or stenosis of unspecified precerebral arteries: Secondary | ICD-10-CM | POA: Diagnosis not present

## 2015-08-19 DIAGNOSIS — Z87891 Personal history of nicotine dependence: Secondary | ICD-10-CM | POA: Diagnosis not present

## 2015-08-19 DIAGNOSIS — Z888 Allergy status to other drugs, medicaments and biological substances status: Secondary | ICD-10-CM | POA: Diagnosis not present

## 2015-08-19 DIAGNOSIS — H5441 Blindness, right eye, normal vision left eye: Secondary | ICD-10-CM | POA: Diagnosis not present

## 2015-08-19 DIAGNOSIS — I6300 Cerebral infarction due to thrombosis of unspecified precerebral artery: Secondary | ICD-10-CM | POA: Diagnosis not present

## 2015-08-19 DIAGNOSIS — I638 Other cerebral infarction: Secondary | ICD-10-CM | POA: Diagnosis not present

## 2015-08-19 DIAGNOSIS — I10 Essential (primary) hypertension: Secondary | ICD-10-CM | POA: Diagnosis not present

## 2015-08-19 DIAGNOSIS — R4182 Altered mental status, unspecified: Secondary | ICD-10-CM | POA: Diagnosis not present

## 2015-08-19 DIAGNOSIS — G9340 Encephalopathy, unspecified: Secondary | ICD-10-CM | POA: Diagnosis not present

## 2015-08-19 DIAGNOSIS — Z885 Allergy status to narcotic agent status: Secondary | ICD-10-CM

## 2015-08-19 DIAGNOSIS — Z8249 Family history of ischemic heart disease and other diseases of the circulatory system: Secondary | ICD-10-CM | POA: Diagnosis not present

## 2015-08-19 DIAGNOSIS — D696 Thrombocytopenia, unspecified: Secondary | ICD-10-CM | POA: Diagnosis present

## 2015-08-19 DIAGNOSIS — Z87892 Personal history of anaphylaxis: Secondary | ICD-10-CM

## 2015-08-19 DIAGNOSIS — I69354 Hemiplegia and hemiparesis following cerebral infarction affecting left non-dominant side: Secondary | ICD-10-CM

## 2015-08-19 DIAGNOSIS — Z85828 Personal history of other malignant neoplasm of skin: Secondary | ICD-10-CM | POA: Diagnosis not present

## 2015-08-19 DIAGNOSIS — Z7902 Long term (current) use of antithrombotics/antiplatelets: Secondary | ICD-10-CM | POA: Diagnosis not present

## 2015-08-19 DIAGNOSIS — R402411 Glasgow coma scale score 13-15, in the field [EMT or ambulance]: Secondary | ICD-10-CM | POA: Diagnosis not present

## 2015-08-19 DIAGNOSIS — D6489 Other specified anemias: Secondary | ICD-10-CM | POA: Diagnosis not present

## 2015-08-19 DIAGNOSIS — R29704 NIHSS score 4: Secondary | ICD-10-CM | POA: Diagnosis present

## 2015-08-19 DIAGNOSIS — G1221 Amyotrophic lateral sclerosis: Secondary | ICD-10-CM | POA: Diagnosis not present

## 2015-08-19 DIAGNOSIS — Z823 Family history of stroke: Secondary | ICD-10-CM | POA: Diagnosis not present

## 2015-08-19 DIAGNOSIS — M069 Rheumatoid arthritis, unspecified: Secondary | ICD-10-CM | POA: Diagnosis present

## 2015-08-19 LAB — I-STAT CHEM 8, ED
BUN: 15 mg/dL (ref 6–20)
CHLORIDE: 98 mmol/L — AB (ref 101–111)
CREATININE: 1.3 mg/dL — AB (ref 0.61–1.24)
Calcium, Ion: 1.08 mmol/L — ABNORMAL LOW (ref 1.13–1.30)
Glucose, Bld: 119 mg/dL — ABNORMAL HIGH (ref 65–99)
HEMATOCRIT: 39 % (ref 39.0–52.0)
Hemoglobin: 13.3 g/dL (ref 13.0–17.0)
Potassium: 4 mmol/L (ref 3.5–5.1)
Sodium: 133 mmol/L — ABNORMAL LOW (ref 135–145)
TCO2: 23 mmol/L (ref 0–100)

## 2015-08-19 LAB — URINALYSIS, ROUTINE W REFLEX MICROSCOPIC
BILIRUBIN URINE: NEGATIVE
GLUCOSE, UA: NEGATIVE mg/dL
Hgb urine dipstick: NEGATIVE
KETONES UR: NEGATIVE mg/dL
LEUKOCYTES UA: NEGATIVE
NITRITE: NEGATIVE
PH: 6.5 (ref 5.0–8.0)
PROTEIN: NEGATIVE mg/dL
Specific Gravity, Urine: 1.014 (ref 1.005–1.030)

## 2015-08-19 LAB — DIFFERENTIAL
BASOS ABS: 0 10*3/uL (ref 0.0–0.1)
Basophils Relative: 0 %
EOS PCT: 1 %
Eosinophils Absolute: 0.1 10*3/uL (ref 0.0–0.7)
LYMPHS ABS: 1.4 10*3/uL (ref 0.7–4.0)
LYMPHS PCT: 21 %
Monocytes Absolute: 1.1 10*3/uL — ABNORMAL HIGH (ref 0.1–1.0)
Monocytes Relative: 16 %
NEUTROS ABS: 4.3 10*3/uL (ref 1.7–7.7)
NEUTROS PCT: 62 %

## 2015-08-19 LAB — RAPID URINE DRUG SCREEN, HOSP PERFORMED
AMPHETAMINES: NOT DETECTED
BARBITURATES: NOT DETECTED
Benzodiazepines: NOT DETECTED
Cocaine: NOT DETECTED
OPIATES: NOT DETECTED
TETRAHYDROCANNABINOL: NOT DETECTED

## 2015-08-19 LAB — CBC
HCT: 36.2 % — ABNORMAL LOW (ref 39.0–52.0)
HEMOGLOBIN: 12.5 g/dL — AB (ref 13.0–17.0)
MCH: 32.1 pg (ref 26.0–34.0)
MCHC: 34.5 g/dL (ref 30.0–36.0)
MCV: 92.8 fL (ref 78.0–100.0)
PLATELETS: 126 10*3/uL — AB (ref 150–400)
RBC: 3.9 MIL/uL — AB (ref 4.22–5.81)
RDW: 13.9 % (ref 11.5–15.5)
WBC: 7 10*3/uL (ref 4.0–10.5)

## 2015-08-19 LAB — COMPREHENSIVE METABOLIC PANEL
ALBUMIN: 3.4 g/dL — AB (ref 3.5–5.0)
ALK PHOS: 46 U/L (ref 38–126)
ALT: 17 U/L (ref 17–63)
AST: 25 U/L (ref 15–41)
Anion gap: 8 (ref 5–15)
BILIRUBIN TOTAL: 0.7 mg/dL (ref 0.3–1.2)
BUN: 12 mg/dL (ref 6–20)
CALCIUM: 8.3 mg/dL — AB (ref 8.9–10.3)
CO2: 21 mmol/L — AB (ref 22–32)
Chloride: 98 mmol/L — ABNORMAL LOW (ref 101–111)
Creatinine, Ser: 1.26 mg/dL — ABNORMAL HIGH (ref 0.61–1.24)
GFR calc Af Amer: >60 mL/min (ref >60)
GFR calc non Af Amer: 55 mL/min — ABNORMAL LOW (ref >60)
GLUCOSE: 119 mg/dL — AB (ref 65–99)
Potassium: 3.8 mmol/L (ref 3.5–5.1)
SODIUM: 127 mmol/L — AB (ref 135–145)
TOTAL PROTEIN: 7 g/dL (ref 6.5–8.1)

## 2015-08-19 LAB — I-STAT TROPONIN, ED: Troponin i, poc: 0.01 ng/mL (ref 0.00–0.08)

## 2015-08-19 LAB — ETHANOL: Alcohol, Ethyl (B): <5 mg/dL (ref <5)

## 2015-08-19 LAB — APTT: aPTT: 34 seconds (ref 24–37)

## 2015-08-19 LAB — PROTIME-INR
INR: 1.13 (ref 0.00–1.49)
Prothrombin Time: 14.7 seconds (ref 11.6–15.2)

## 2015-08-19 LAB — CBG MONITORING, ED: Glucose-Capillary: 118 mg/dL — ABNORMAL HIGH (ref 65–99)

## 2015-08-19 MED ORDER — ACETAMINOPHEN 500 MG PO TABS
1000.0000 mg | ORAL_TABLET | Freq: Once | ORAL | Status: AC
  Administered 2015-08-19: 1000 mg via ORAL
  Filled 2015-08-19: qty 2

## 2015-08-19 MED ORDER — SODIUM CHLORIDE 0.9 % IV BOLUS (SEPSIS)
1000.0000 mL | Freq: Once | INTRAVENOUS | Status: AC
  Administered 2015-08-19: 1000 mL via INTRAVENOUS

## 2015-08-19 NOTE — ED Notes (Signed)
Brett Ramsay, MD remains at bedside.

## 2015-08-19 NOTE — ED Notes (Signed)
Per EMS, pt's family reports that the pt has became confused today around dusk. Pt reportedly had a shuffling gait today, which had improved by the time EMS arrived on scene. Pt is taking abx for cellulitis in his left arm for an unknown amount of time. On EMS arrival, put would not open his eyes, and would not speak, and was unable to follow commands. Upon arrival to the ER, pt following commands, and AxOx2. Disoriented to time and situation.

## 2015-08-19 NOTE — ED Notes (Signed)
To MRI

## 2015-08-19 NOTE — ED Notes (Signed)
CBG 118 

## 2015-08-19 NOTE — ED Provider Notes (Signed)
CSN: [id number]     Arrival date & time 08/19/15  2022 History   First MD Initiated Contact with Patient 08/19/15 2022     Chief Complaint  Patient presents with  . Code Stroke    Patient is a 74 y.o. male presenting with altered mental status. The history is provided by the patient, a relative and the EMS personnel.  Altered Mental Status Presenting symptoms: confusion   Presenting symptoms comment:  Weakness, speech problems Severity:  Moderate Most recent episode:  Today Timing:  Constant Progression:  Partially resolved Chronicity:  New Context: not dementia and not head injury   Associated symptoms: weakness   Associated symptoms: no abdominal pain, no fever, no headaches, no nausea, no rash and no vomiting   Weakness:    Severity:  Moderate   Past Medical History  Diagnosis Date  . BASAL CELL CARCINOMA, FACE 03/31/2009  . 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 CONSTIPATION 06/22/2008  . DIVERTICULOSIS, COLON 03/24/2007  . GERD 03/24/2007  . HYPERLIPIDEMIA 08/27/2007  . HYPERTENSION 12/14/2008  . Rheumatoid arthritis(714.0) 03/24/2007  . Blind right eye   . Retinal detachment     hx of  . Keratosis Oct. 2013  . Fall at home Nov. 5, 2014  06-28-13    in the home and Outside as well  . 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 Stroke [facility]) Oct 2014  . 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 at 11/09/13   Past Surgical History  Procedure Laterality Date  . Total knee arthroplasty      bital  . Carpal tunnel release Right 2003  . Cataract extraction  1980    removed cataract in left eye. Currently blind in right eye  . Cholecystectomy  2001    Gall Bladder  . Retinal detachment surgery  2004  . Lumbar puncture  Oct. 21, 2014  . Nerve conduction    . Mri  07-14-13  . Carotid endarterectomy Right 08-15-07    cea  . Joint replacement  2000    Right knee  replacement  . Eye surgery  [id number]    retinal detachment  . Eye surgery      left cataract surgery  . Shoulder surgery Left 2009    hx "frozen" shoulder   Family History  Problem Relation Age of Onset  . Stroke Mother 32  . Hypertension Mother   . Heart disease Father   . Aneurysm Father   . Coronary artery disease Sister   . Pneumonia Sister   . Heart disease Sister   . Diabetes Sister   . Hypertension Sister   . Colon cancer Neg Hx    Social History  Substance Use Topics  . Smoking status: Former Smoker -- 23 years    Types: Cigarettes    Quit date: 09/24/1980  . Smokeless tobacco: Never Used  . Alcohol Use: No    Review of Systems  Constitutional: Negative for fever and chills.  HENT: Negative for rhinorrhea and sore throat.   Eyes: Negative for visual disturbance.  Respiratory: Negative for cough and shortness of breath.   Cardiovascular: Negative for chest pain.  Gastrointestinal: Negative for nausea, vomiting, abdominal pain, diarrhea and constipation.  Genitourinary: Negative for dysuria and hematuria.  Musculoskeletal: Negative for back pain and neck pain.  Skin: Negative for rash.  Neurological: Positive for speech difficulty  and weakness. Negative for syncope and headaches.  Psychiatric/Behavioral: Positive for confusion.  All other systems reviewed and are negative.  Allergies  Dopamine; Morphine and related; and Vicodin  Home Medications   Prior to Admission medications   Medication Sig Start Date End Date Taking? Authorizing Provider  acetaminophen (TYLENOL) 500 MG tablet Take 500 mg by mouth every 6 (six) hours as needed for fever.   Yes Historical Provider, MD  alendronate (FOSAMAX) 70 MG tablet Take 70 mg by mouth once a week. Take with a full glass of water on an empty stomach. Takes on Wednesday   Yes Historical Provider, MD  clopidogrel (PLAVIX) 75 MG tablet Take 1 tablet (75 mg total) by mouth daily. 11/02/14  Yes Marletta Lor, MD   folic acid-pyridoxine-cyancobalamin (VIRT-VITE FORTE) 2.5-25-2 MG TABS Take 1 tablet by mouth once a week. Takes on days he takes methotrexate. (Saturdays)   Yes Historical Provider, MD  latanoprost (XALATAN) 0.005 % ophthalmic solution Place 1 drop into the left eye at bedtime.    Yes Historical Provider, MD  methotrexate (RHEUMATREX) 2.5 MG tablet Take 10 mg by mouth once a week. Caution:Chemotherapy. Protect from light.   Yes Historical Provider, MD  Multiple Vitamins-Minerals (OCUVITE ADULT 50+) CAPS Take 1 capsule by mouth daily.   Yes Historical Provider, MD  polyethylene glycol (MIRALAX / GLYCOLAX) packet Take 17 g by mouth daily as needed (for constipation).   Yes Historical Provider, MD  Probiotic Product (PROBIOTIC DAILY PO) Take 1 tablet by mouth daily.   Yes Historical Provider, MD  sodium chloride (OCEAN) 0.65 % SOLN nasal spray Place 1 spray into both nostrils 3 (three) times daily.   Yes Historical Provider, MD  tamsulosin (FLOMAX) 0.4 MG CAPS capsule Take 0.4 mg by mouth daily. 08/10/15  Yes Historical Provider, MD  timolol (TIMOPTIC) 0.5 % ophthalmic solution Place 1 drop into the left eye every evening.    Yes Historical Provider, MD  cephALEXin (KEFLEX) 500 MG capsule Take 1 capsule (500 mg total) by mouth 3 (three) times daily. Patient not taking: Reported on 08/19/2015 08/11/15   Lucretia Kern, DO   BP 149/94 mmHg  Pulse 74  Temp(Src) 100.3 F (37.9 C) (Rectal)  Resp 15  SpO2 97% Physical Exam  Constitutional: He is oriented to person, place, and time. He appears well-developed and well-nourished. No distress.  HENT:  Head: Normocephalic and atraumatic.  Mouth/Throat: Oropharynx is clear and moist.  Eyes: EOM are normal.  Neck: Neck supple. No JVD present. No rigidity.  Cardiovascular: Normal rate, regular rhythm, normal heart sounds and intact distal pulses.   Pulmonary/Chest: Effort normal and breath sounds normal.  Abdominal: Soft. He exhibits no distension. There  is no tenderness.  Musculoskeletal: Normal range of motion. He exhibits no edema.  Neurological: He is alert and oriented to person, place, and time. He has normal strength. No cranial nerve deficit or sensory deficit. GCS eye subscore is 4. GCS verbal subscore is 5. GCS motor subscore is 6.  Reflex Scores:      Bicep reflexes are 2+ on the left side.      Brachioradialis reflexes are 2+ on the left side.      Patellar reflexes are 2+ on the right side and 2+ on the left side. Skin: Skin is warm and dry.  Psychiatric: His behavior is normal.    ED Course  Procedures  None   Labs Review Labs Reviewed  CBC - Abnormal; Notable for the following:  RBC 3.90 (*)    Hemoglobin 12.5 (*)    HCT 36.2 (*)    Platelets 126 (*)    All other components within normal limits  DIFFERENTIAL - Abnormal; Notable for the following:    Monocytes Absolute 1.1 (*)    All other components within normal limits  COMPREHENSIVE METABOLIC PANEL - Abnormal; Notable for the following:    Sodium 127 (*)    Chloride 98 (*)    CO2 21 (*)    Glucose, Bld 119 (*)    Creatinine, Ser 1.26 (*)    Calcium 8.3 (*)    Albumin 3.4 (*)    GFR calc non Af Amer 55 (*)    All other components within normal limits  I-STAT CHEM 8, ED - Abnormal; Notable for the following:    Sodium 133 (*)    Chloride 98 (*)    Creatinine, Ser 1.30 (*)    Glucose, Bld 119 (*)    Calcium, Ion 1.08 (*)    All other components within normal limits  CBG MONITORING, ED - Abnormal; Notable for the following:    Glucose-Capillary 118 (*)    All other components within normal limits  CULTURE, BLOOD (ROUTINE X 2)  CULTURE, BLOOD (ROUTINE X 2)  ETHANOL  PROTIME-INR  APTT  URINE RAPID DRUG SCREEN, HOSP PERFORMED  URINALYSIS, ROUTINE W REFLEX MICROSCOPIC (NOT AT [facility])  INFLUENZA PANEL BY PCR (TYPE A & B, H1N1)  I-STAT TROPOININ, ED    Imaging Review Dg Chest 2 View  08/19/2015  CLINICAL DATA:  Stroke symptoms tonight. Fever.  Hypertension. Ex-smoker. EXAM: CHEST  2 VIEW COMPARISON:  10/28/2013 FINDINGS: Mild cardiac enlargement without vascular congestion. Interstitial changes in the lung bases similar to previous study, likely fibrosis. No focal airspace disease. No blunting of costophrenic angles. No pneumothorax. Calcified and tortuous aorta. Surgical clips in the right upper quadrant. 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 Mild cardiac enlargement. No evidence of active pulmonary disease. Electronically Signed   By: Lucienne Capers M.D.   On: 08/19/2015 23:02   Ct Head Wo Contrast  08/19/2015  CLINICAL DATA:  Altered mental status.  No weakness.  Code stroke. EXAM: CT HEAD WITHOUT CONTRAST TECHNIQUE: Contiguous axial images were obtained from the base of the skull through the vertex without intravenous contrast. COMPARISON:  07/16/2013 FINDINGS: Diffuse cerebral atrophy. No ventricular dilatation. Low-attenuation changes in the deep white matter consistent with small vessel ischemia. Focal encephalomalacia in the right frontal lobe consistent with old infarct. No mass effect or midline shift. No abnormal extra-axial fluid collections. Gray-white matter junctions are distinct. Basal cisterns are not effaced. No evidence of acute intracranial hemorrhage. No depressed skull fractures. Mild mucosal thickening in the paranasal sinuses. Mastoid air cells are not opacified. Vascular calcifications. Calcification and deformity of the right globe consistent with old insult. Focal increased density in the left globe likely postoperative. IMPRESSION: No acute intracranial abnormalities. Chronic atrophy and small vessel ischemic changes. Old right frontal infarct. These results were called by telephone at the time of interpretation on 08/19/2015 at 8:38 pm to Dr. Leonel Ramsay, who verbally acknowledged these results. Electronically Signed   By: Lucienne Capers M.D.   On: 08/19/2015 20:41   [name]  08/19/2015  CLINICAL  DATA:  Initial valuation for acute altered mental status. EXAM: MRI HEAD WITHOUT CONTRAST TECHNIQUE: Multiplanar, multiecho pulse sequences of the brain and surrounding structures were obtained without intravenous contrast. COMPARISON:  Prior head CT from earlier the same  day P FINDINGS: Diffusion-weighted sequences demonstrate a possible tiny 3 mm cortical infarct within the left temporal lobe (series 3, image 20). No associated edema or hemorrhage. No other infarct. Largely absent flow void within the diminutive right vertebral artery, stable. Major intracranial vascular flow voids are otherwise maintained. Diffuse prominence of the CSF containing spaces is compatible with generalized age-related cerebral atrophy. Mild chronic small vessel ischemic disease in within the periventricular white matter. Encephalomalacia within the high right frontal lobe consistent with remote ischemic infarct. There is associated gliosis within this region. Multiple additional remote lacunar infarcts within the right basal ganglia involving both the caudate and lentiform nuclei. No mass lesion, midline shift, or mass effect. No hydrocephalus. No extra-axial fluid collection. Pituitary gland normal. No acute abnormality about the globes. 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 Mild mucosal thickening within the ethmoidal air cells and maxillary sinuses. No mastoid effusion. Inner ear structures normal. Bone marrow signal intensity within normal limits. No scalp soft tissue abnormality. IMPRESSION: 1. 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 2. No other acute intracranial process identified. 3. Moderate to large remote infarct within the posterior right frontal lobe with extension into the operculum. Additional remote lacunar infarcts involving the right basal ganglia. 4. Nonvisualization of the right vertebral artery, which may be  occluded. This is stable relative to previous MRI from 2014. 5. Atrophy with mild chronic small vessel ischemic disease. Electronically Signed   By: Jeannine Boga M.D.   On: 08/19/2015 23:14   I have personally reviewed and evaluated these images and lab results as part of my medical decision-making.  MDM   Final diagnoses:  Cerebral infarction due to unspecified mechanism  Fever, unspecified fever cause    74 yo M with a PMH of TIA, carotid disease s/p right CEA in 2008, and ALS who presents with AMS. family reports confusion and difficulty moving from chair. No focal findings on exam. No nuchal rigidity. No rashes. Lungs clear. Pt febrile to 102 with urinary symptoms. No focal neuro deficits on exam. Head CT normal. Neuro at bedside to evaluate. Will get blood and urine CX and MR brain. Also added flu PCR.  Fever workup nonfocal. MR brain shows left temporal area of infarct. Neurology notified and is aware of findings. Will admit to hospitalist for stroke workup.  Discussed with Dr. Dayna Barker.    Gustavus Bryant, MD 08/20/15 [id number]  Merrily Pew, MD 08/21/15 [id number]

## 2015-08-19 NOTE — ED Notes (Signed)
Patient returned from [place] and MRI

## 2015-08-19 NOTE — Consult Note (Signed)
Neurology Consultation Reason for Consult: Altered mental status Referring Physician: Mesner, J  CC: Altered mental status  History is obtained from: Patient  HPI: Brett Castaneda is a 74 y.o. male with a history of previous stroke who presents with increasing confusion today. His wife states that he was in his normal state of health around 4:30 PM, and then subsequently began becoming progressively more confused. Finally when she tried to get him up, she noticed that he was "unable to move" anywhere. He was very tremulous, but not in any particular area.  Of note, he has not been eating or drinking well for the past few days and has been feeling "crummy" and has had the sensation of urinary urgency 5 times within the past couple of hours. He was also just on Keflex for left arm cellulitis which has appeared to resolve.   LKW: 4:30 PM tpa given?: no, mild symptoms    ROS: A 14 point ROS was performed and is negative except as noted in the HPI.   Past Medical History  Diagnosis Date  . BASAL CELL CARCINOMA, FACE 03/31/2009  . 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 CONSTIPATION 06/22/2008  . DIVERTICULOSIS, COLON 03/24/2007  . GERD 03/24/2007  . HYPERLIPIDEMIA 08/27/2007  . HYPERTENSION 12/14/2008  . Rheumatoid arthritis(714.0) 03/24/2007  . Blind right eye   . Retinal detachment     hx of  . Keratosis Oct. 2013  . Fall at home Nov. 5, 2014  06-28-13    in the home and Outside as well  . 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 Stroke [facility]) Oct 2014  . 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 Stroke Mother 110  . Hypertension Mother   . Heart disease Father   . Aneurysm Father   . Coronary artery disease Sister   . Pneumonia Sister   . Heart disease Sister   . Diabetes Sister   . Hypertension Sister   .  Colon cancer Neg Hx      Social History:  reports that he quit smoking about 34 years ago. His smoking use included Cigarettes. He quit after 23 years of use. He has never used smokeless tobacco. He reports that he does not drink alcohol or use illicit drugs.   Exam: Current vital signs: BP 168/105 mmHg  Pulse 86  Temp(Src) 99.4 F (37.4 C) (Oral)  Resp 10  SpO2 100% Vital signs in last 24 hours: Temp:  [99.4 F (37.4 C)] 99.4 F (37.4 C) (11/25 2055) Pulse Rate:  [86-91] 86 (11/25 2055) Resp:  [10-21] 10 (11/25 2055) BP: (168)/(105-121) 168/105 mmHg (11/25 2045) SpO2:  [98 %-100 %] 100 % (11/25 2055)   Physical Exam  Constitutional: Appears well-developed and well-nourished.  Psych: Affect appropriate to situation Eyes: No scleral injection HENT: Neck is supple without meningismus.  Head: Normocephalic.  Cardiovascular: Normal rate and regular rhythm.  Respiratory: Effort normal and breath sounds normal to anterior ascultation GI: Soft.  No distension. There is no tenderness.  Skin: WDI  Neuro: Mental Status: Patient is awake, alert, oriented to person, place, but not month, year. He has some dificulty with more complex commnads(e.g. When doing FNF) No signs of aphasia, mild neglect(extinguishes to DSS) He does not describe any aspect of the "cookie  caper" picture very well.  Cranial Nerves: II: blind in right eye. Blinks to threat bilaterally in left eye.   R pupil non-reactive(baseline), left pupile is 72mm and reactive.  III,IV, VI: right eye exotropic, left eye with intact EOMI.  V: Facial sensation is symmetric to temperature VII: Facial movement is symmetric.  VIII: hearing is intact to voice X: Uvula elevates symmetrically XI: Shoulder shrug is symmetric. XII: tongue is midline without atrophy or fasciculations.  Motor: Tone is mildly increased on the left. Bulk is normal. He has a very mild left hemiparesis but does not drift.  Sensory: Sensation is  symmetric to light touch and pin. He does extinguish to DSS Cerebellar: FNF  intact bilaterally   I have reviewed labs in epic and the results pertinent to this consultation are: cbg 118  I have reviewed the images obtained:CT head- previous Right sided infarct.   Impression: 74 yo M with previous perioperative endarterectomy infarct on the right with new altered mental status after feeling poorly for several days now with fever and urinary symptoms. Does not have meningismus to suggest meningitis. It may be reasonable given his neglect that was not clearly there before to perform an MRI to rule out stroke, but my suspicioni for this is low.   Recommendations: 1) MRI brain 2) UA 3) further recs pending above studies.    Roland Rack, MD [facility] [phone]  If 7pm- 7am, please page neurology on call as listed in [place].

## 2015-08-19 NOTE — ED Notes (Signed)
Kirkpatrick, MD at bedside.  

## 2015-08-20 ENCOUNTER — Observation Stay (HOSPITAL_COMMUNITY): Payer: Medicare Other

## 2015-08-20 ENCOUNTER — Observation Stay (HOSPITAL_COMMUNITY)
Admit: 2015-08-20 | Discharge: 2015-08-20 | Disposition: A | Discharge status: Home / Self Care | Payer: Medicare Other | Attending: Neurology | Admitting: Neurology

## 2015-08-20 ENCOUNTER — Observation Stay (HOSPITAL_BASED_OUTPATIENT_CLINIC_OR_DEPARTMENT_OTHER): Payer: Medicare Other

## 2015-08-20 DIAGNOSIS — I10 Essential (primary) hypertension: Secondary | ICD-10-CM | POA: Diagnosis not present

## 2015-08-20 DIAGNOSIS — E860 Dehydration: Secondary | ICD-10-CM | POA: Diagnosis not present

## 2015-08-20 DIAGNOSIS — R0789 Other chest pain: Secondary | ICD-10-CM | POA: Diagnosis not present

## 2015-08-20 DIAGNOSIS — R531 Weakness: Secondary | ICD-10-CM | POA: Diagnosis not present

## 2015-08-20 DIAGNOSIS — Z85828 Personal history of other malignant neoplasm of skin: Secondary | ICD-10-CM | POA: Diagnosis not present

## 2015-08-20 DIAGNOSIS — Z87892 Personal history of anaphylaxis: Secondary | ICD-10-CM | POA: Diagnosis not present

## 2015-08-20 DIAGNOSIS — R29704 NIHSS score 4: Secondary | ICD-10-CM | POA: Diagnosis present

## 2015-08-20 DIAGNOSIS — M069 Rheumatoid arthritis, unspecified: Secondary | ICD-10-CM | POA: Diagnosis not present

## 2015-08-20 DIAGNOSIS — R509 Fever, unspecified: Secondary | ICD-10-CM | POA: Insufficient documentation

## 2015-08-20 DIAGNOSIS — I638 Other cerebral infarction: Secondary | ICD-10-CM | POA: Diagnosis not present

## 2015-08-20 DIAGNOSIS — Z8249 Family history of ischemic heart disease and other diseases of the circulatory system: Secondary | ICD-10-CM | POA: Diagnosis not present

## 2015-08-20 DIAGNOSIS — Y93E1 Activity, personal bathing and showering: Secondary | ICD-10-CM | POA: Diagnosis not present

## 2015-08-20 DIAGNOSIS — H5441 Blindness, right eye, normal vision left eye: Secondary | ICD-10-CM | POA: Diagnosis not present

## 2015-08-20 DIAGNOSIS — N179 Acute kidney failure, unspecified: Secondary | ICD-10-CM | POA: Diagnosis not present

## 2015-08-20 DIAGNOSIS — I69351 Hemiplegia and hemiparesis following cerebral infarction affecting right dominant side: Secondary | ICD-10-CM | POA: Diagnosis not present

## 2015-08-20 DIAGNOSIS — Z79899 Other long term (current) drug therapy: Secondary | ICD-10-CM | POA: Diagnosis not present

## 2015-08-20 DIAGNOSIS — I6320 Cerebral infarction due to unspecified occlusion or stenosis of unspecified precerebral arteries: Secondary | ICD-10-CM | POA: Diagnosis not present

## 2015-08-20 DIAGNOSIS — Z7982 Long term (current) use of aspirin: Secondary | ICD-10-CM | POA: Diagnosis not present

## 2015-08-20 DIAGNOSIS — D696 Thrombocytopenia, unspecified: Secondary | ICD-10-CM | POA: Diagnosis present

## 2015-08-20 DIAGNOSIS — Z7902 Long term (current) use of antithrombotics/antiplatelets: Secondary | ICD-10-CM | POA: Diagnosis not present

## 2015-08-20 DIAGNOSIS — I6300 Cerebral infarction due to thrombosis of unspecified precerebral artery: Secondary | ICD-10-CM | POA: Diagnosis not present

## 2015-08-20 DIAGNOSIS — R079 Chest pain, unspecified: Secondary | ICD-10-CM | POA: Diagnosis not present

## 2015-08-20 DIAGNOSIS — E871 Hypo-osmolality and hyponatremia: Secondary | ICD-10-CM

## 2015-08-20 DIAGNOSIS — I6789 Other cerebrovascular disease: Secondary | ICD-10-CM | POA: Diagnosis not present

## 2015-08-20 DIAGNOSIS — Z791 Long term (current) use of non-steroidal anti-inflammatories (NSAID): Secondary | ICD-10-CM | POA: Diagnosis not present

## 2015-08-20 DIAGNOSIS — D6489 Other specified anemias: Secondary | ICD-10-CM | POA: Diagnosis not present

## 2015-08-20 DIAGNOSIS — Z833 Family history of diabetes mellitus: Secondary | ICD-10-CM | POA: Diagnosis not present

## 2015-08-20 DIAGNOSIS — E785 Hyperlipidemia, unspecified: Secondary | ICD-10-CM | POA: Diagnosis not present

## 2015-08-20 DIAGNOSIS — R4182 Altered mental status, unspecified: Secondary | ICD-10-CM | POA: Diagnosis not present

## 2015-08-20 DIAGNOSIS — Z888 Allergy status to other drugs, medicaments and biological substances status: Secondary | ICD-10-CM | POA: Diagnosis not present

## 2015-08-20 DIAGNOSIS — I6340 Cerebral infarction due to embolism of unspecified cerebral artery: Secondary | ICD-10-CM | POA: Diagnosis present

## 2015-08-20 DIAGNOSIS — I69354 Hemiplegia and hemiparesis following cerebral infarction affecting left non-dominant side: Secondary | ICD-10-CM | POA: Diagnosis not present

## 2015-08-20 DIAGNOSIS — I639 Cerebral infarction, unspecified: Secondary | ICD-10-CM | POA: Diagnosis not present

## 2015-08-20 DIAGNOSIS — M0510 Rheumatoid lung disease with rheumatoid arthritis of unspecified site: Secondary | ICD-10-CM

## 2015-08-20 DIAGNOSIS — M05719 Rheumatoid arthritis with rheumatoid factor of unspecified shoulder without organ or systems involvement: Secondary | ICD-10-CM

## 2015-08-20 DIAGNOSIS — Z66 Do not resuscitate: Secondary | ICD-10-CM | POA: Diagnosis not present

## 2015-08-20 DIAGNOSIS — S0181XA Laceration without foreign body of other part of head, initial encounter: Secondary | ICD-10-CM | POA: Diagnosis not present

## 2015-08-20 DIAGNOSIS — G1221 Amyotrophic lateral sclerosis: Secondary | ICD-10-CM | POA: Diagnosis not present

## 2015-08-20 DIAGNOSIS — Z96653 Presence of artificial knee joint, bilateral: Secondary | ICD-10-CM | POA: Diagnosis not present

## 2015-08-20 DIAGNOSIS — Y92002 Bathroom of unspecified non-institutional (private) residence single-family (private) house as the place of occurrence of the external cause: Secondary | ICD-10-CM | POA: Diagnosis not present

## 2015-08-20 DIAGNOSIS — S0101XA Laceration without foreign body of scalp, initial encounter: Secondary | ICD-10-CM | POA: Diagnosis not present

## 2015-08-20 DIAGNOSIS — I4581 Long QT syndrome: Secondary | ICD-10-CM | POA: Diagnosis not present

## 2015-08-20 DIAGNOSIS — D649 Anemia, unspecified: Secondary | ICD-10-CM | POA: Diagnosis present

## 2015-08-20 DIAGNOSIS — Z885 Allergy status to narcotic agent status: Secondary | ICD-10-CM | POA: Diagnosis not present

## 2015-08-20 DIAGNOSIS — Z7983 Long term (current) use of bisphosphonates: Secondary | ICD-10-CM | POA: Diagnosis not present

## 2015-08-20 DIAGNOSIS — Z96651 Presence of right artificial knee joint: Secondary | ICD-10-CM | POA: Diagnosis present

## 2015-08-20 DIAGNOSIS — Z823 Family history of stroke: Secondary | ICD-10-CM | POA: Diagnosis not present

## 2015-08-20 DIAGNOSIS — Z9049 Acquired absence of other specified parts of digestive tract: Secondary | ICD-10-CM | POA: Diagnosis not present

## 2015-08-20 DIAGNOSIS — Z9842 Cataract extraction status, left eye: Secondary | ICD-10-CM | POA: Diagnosis not present

## 2015-08-20 DIAGNOSIS — Z87891 Personal history of nicotine dependence: Secondary | ICD-10-CM | POA: Diagnosis not present

## 2015-08-20 DIAGNOSIS — W1830XA Fall on same level, unspecified, initial encounter: Secondary | ICD-10-CM | POA: Diagnosis not present

## 2015-08-20 DIAGNOSIS — R55 Syncope and collapse: Secondary | ICD-10-CM | POA: Diagnosis not present

## 2015-08-20 LAB — OSMOLALITY, URINE: Osmolality, Ur: 149 mOsm/kg — ABNORMAL LOW (ref 300–900)

## 2015-08-20 LAB — LIPID PANEL
CHOL/HDL RATIO: 5.5 ratio
CHOLESTEROL: 137 mg/dL (ref 0–200)
HDL: 25 mg/dL — AB (ref >40)
LDL CALC: 98 mg/dL (ref 0–99)
TRIGLYCERIDES: 72 mg/dL (ref <150)
VLDL: 14 mg/dL (ref 0–40)

## 2015-08-20 LAB — CREATININE, URINE, RANDOM: Creatinine, Urine: 29.77 mg/dL

## 2015-08-20 LAB — SODIUM, URINE, RANDOM: Sodium, Ur: 37 mmol/L

## 2015-08-20 LAB — NA AND K (SODIUM & POTASSIUM), RAND UR
Potassium Urine: 8 mmol/L
Sodium, Ur: 36 mmol/L

## 2015-08-20 LAB — INFLUENZA PANEL BY PCR (TYPE A & B)
H1N1FLUPCR: NOT DETECTED
INFLBPCR: NEGATIVE
Influenza A By PCR: NEGATIVE

## 2015-08-20 MED ORDER — ASPIRIN EC 81 MG PO TBEC
81.0000 mg | DELAYED_RELEASE_TABLET | Freq: Every day | ORAL | Status: DC
  Administered 2015-08-21 – 2015-08-22 (×2): 81 mg via ORAL
  Filled 2015-08-20 (×2): qty 1

## 2015-08-20 MED ORDER — RISAQUAD PO CAPS
1.0000 | ORAL_CAPSULE | Freq: Every day | ORAL | Status: DC
  Administered 2015-08-20 – 2015-08-23 (×4): 1 via ORAL
  Filled 2015-08-20 (×4): qty 1

## 2015-08-20 MED ORDER — OCUVITE PO TABS
1.0000 | ORAL_TABLET | Freq: Every day | ORAL | Status: DC
  Administered 2015-08-20 – 2015-08-23 (×4): 1 via ORAL
  Filled 2015-08-20 (×4): qty 1

## 2015-08-20 MED ORDER — METHOTREXATE 2.5 MG PO TABS
10.0000 mg | ORAL_TABLET | ORAL | Status: DC
  Administered 2015-08-20: 10 mg via ORAL
  Filled 2015-08-20: qty 4

## 2015-08-20 MED ORDER — ASPIRIN 325 MG PO TABS
325.0000 mg | ORAL_TABLET | Freq: Every day | ORAL | Status: DC
  Administered 2015-08-20: 325 mg via ORAL
  Filled 2015-08-20: qty 1

## 2015-08-20 MED ORDER — SODIUM CHLORIDE 0.9 % IV SOLN: INTRAVENOUS | Status: DC

## 2015-08-20 MED ORDER — POLYETHYLENE GLYCOL 3350 17 G PO PACK: 17.0000 g | PACK | Freq: Every day | ORAL | Status: DC | PRN

## 2015-08-20 MED ORDER — TAMSULOSIN HCL 0.4 MG PO CAPS
0.4000 mg | ORAL_CAPSULE | Freq: Every day | ORAL | Status: DC
  Administered 2015-08-20 – 2015-08-23 (×4): 0.4 mg via ORAL
  Filled 2015-08-20 (×4): qty 1

## 2015-08-20 MED ORDER — TIMOLOL MALEATE 0.5 % OP SOLN
1.0000 [drp] | Freq: Every evening | OPHTHALMIC | Status: DC
  Administered 2015-08-20 – 2015-08-22 (×3): 1 [drp] via OPHTHALMIC
  Filled 2015-08-20: qty 5

## 2015-08-20 MED ORDER — ALENDRONATE SODIUM 70 MG PO TABS: 70.0000 mg | ORAL_TABLET | ORAL | Status: DC

## 2015-08-20 MED ORDER — ENSURE ENLIVE PO LIQD
237.0000 mL | Freq: Two times a day (BID) | ORAL | Status: DC
  Administered 2015-08-20 – 2015-08-21 (×3): 237 mL via ORAL
  Filled 2015-08-20 (×10): qty 237

## 2015-08-20 MED ORDER — ASPIRIN 300 MG RE SUPP: 300.0000 mg | Freq: Every day | RECTAL | Status: DC

## 2015-08-20 MED ORDER — SENNOSIDES-DOCUSATE SODIUM 8.6-50 MG PO TABS: 1.0000 | ORAL_TABLET | Freq: Every evening | ORAL | Status: DC | PRN

## 2015-08-20 MED ORDER — ATORVASTATIN CALCIUM 40 MG PO TABS
40.0000 mg | ORAL_TABLET | Freq: Every day | ORAL | Status: DC
  Administered 2015-08-20 – 2015-08-22 (×3): 40 mg via ORAL
  Filled 2015-08-20 (×3): qty 1

## 2015-08-20 MED ORDER — CLOPIDOGREL BISULFATE 75 MG PO TABS
75.0000 mg | ORAL_TABLET | Freq: Every day | ORAL | Status: DC
  Administered 2015-08-20 – 2015-08-22 (×3): 75 mg via ORAL
  Filled 2015-08-20 (×3): qty 1

## 2015-08-20 MED ORDER — ENOXAPARIN SODIUM 40 MG/0.4ML ~~LOC~~ SOLN
40.0000 mg | Freq: Every day | SUBCUTANEOUS | Status: DC
  Administered 2015-08-20 – 2015-08-23 (×4): 40 mg via SUBCUTANEOUS
  Filled 2015-08-20 (×4): qty 0.4

## 2015-08-20 MED ORDER — LATANOPROST 0.005 % OP SOLN
1.0000 [drp] | Freq: Every day | OPHTHALMIC | Status: DC
  Administered 2015-08-20 – 2015-08-22 (×3): 1 [drp] via OPHTHALMIC
  Filled 2015-08-20: qty 2.5

## 2015-08-20 MED ORDER — ACETAMINOPHEN 325 MG PO TABS
650.0000 mg | ORAL_TABLET | ORAL | Status: DC | PRN
  Administered 2015-08-20 – 2015-08-23 (×4): 650 mg via ORAL
  Filled 2015-08-20 (×3): qty 2

## 2015-08-20 MED ORDER — STROKE: EARLY STAGES OF RECOVERY BOOK
Freq: Once | Status: AC
  Administered 2015-08-20: 1

## 2015-08-20 MED ORDER — FA-PYRIDOXINE-CYANOCOBALAMIN 2.5-25-2 MG PO TABS
1.0000 | ORAL_TABLET | ORAL | Status: DC
  Administered 2015-08-20: 1 via ORAL
  Filled 2015-08-20: qty 1

## 2015-08-20 NOTE — Progress Notes (Signed)

## 2015-08-20 NOTE — Progress Notes (Signed)
Nutrition Brief Note  Patient identified on the Malnutrition Screening Tool (MST) Report  Pt says his weight has trended down over the past 2 years and he relates it to a decrease in his activity. Denies changes in eating habits or appetite.  Wt Readings from Last 15 Encounters:  08/20/15 171 lb 12.8 oz (77.928 kg)  06/30/15 174 lb 6.4 oz (79.107 kg)  05/03/15 176 lb (79.833 kg)  03/01/15 175 lb (79.379 kg)  11/02/14 184 lb (83.462 kg)  08/05/14 184 lb (83.462 kg)  05/17/14 184 lb (83.462 kg)  03/05/14 183 lb 8 oz (83.235 kg)  01/18/14 183 lb (83.008 kg)  11/20/13 179 lb 2 oz (81.251 kg)  10/29/13 182 lb (82.555 kg)  10/19/13 177 lb (80.287 kg)  10/07/13 179 lb 9.6 oz (81.466 kg)  08/17/13 179 lb 8 oz (81.421 kg)  07/31/13 176 lb (79.833 kg)    Body mass index is 23.97 kg/(m^2). Patient meets criteria for normal range based on current BMI.   Current diet order is Heart Heart Healthy  patient is consuming approximately 75% of meals at this time. Labs and medications reviewed.   No nutrition interventions warranted at this time. If nutrition issues arise, please consult RD.   Colman Cater MS,RD,CSG,LDN Office: [phone] Pager: [phone]

## 2015-08-20 NOTE — Progress Notes (Addendum)
[facility] PROGRESS NOTE    Progress Note   Brett Castaneda PQA:[id number] DOB: [birth year] DOA: 08/19/2015 PCP: Nyoka Cowden, MD   Brief Narrative:   Brett Castaneda is an 74 y.o. male Past medical history of a stroke, essential hypertension and rheumatoid arthritis along with ALS who presents to the ED complaining of increased confusion that was noticed by his wife on the day of admission.  Assessment/Plan:   CVA (cerebral infarction): HgbA1c pending, fasting lipid panel HDL < 40 LDL < 98. Cont statins MR of the brain without contrast 3 mm acute ischemic cortical infarct within the left temporal lobe. PT, OT  Speech consult pending. Echocardiogram and Carotid dopplers pending Prophylactic therapy-Antiplatelet med: on plavix at home home will need ASA and plavix will d/w with neuro Avoid D5 fluids as may be harmfull Cardiac Monitoring no events EEG pending.  Hyponatremia: Check urinary sodium and urinary creatinine. Urine osm pending He is not on diuretic at home. He received a liter of normal saline in the ED. -start IV fluids, recheck a b-met in am.  Dyslipidemia Start stains  Essential hypertension Allow permissive HTN.  Rheumatoid arthritis ([place]): Continue methotrexate and folic acid.  Anemia due to other cause Due to chronic disease, Awaiting an anemia panel  MildThrombocytopenia ([place]) Continue to monitor.  Temp 100.3 With no further elevation in temp and no leukocytosis, no cough, SOB or dysuria.    DVT Prophylaxis - Lovenox ordered.  Family Communication: none Disposition Plan: Home when stable. Code Status:     Code Status Orders        Start     Ordered   08/20/15 0142  Full code   Continuous     08/20/15 0141        IV Access:    Peripheral IV   Procedures and diagnostic studies:   Dg Chest 2 View  08/19/2015  CLINICAL DATA:  Stroke symptoms tonight. Fever. Hypertension. Ex-smoker. EXAM: CHEST  2 VIEW COMPARISON:   10/28/2013 FINDINGS: Mild cardiac enlargement without vascular congestion. Interstitial changes in the lung bases similar to previous study, likely fibrosis. No focal airspace disease. No blunting of costophrenic angles. No pneumothorax. Calcified and tortuous aorta. Surgical clips in the right upper quadrant. 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 Mild cardiac enlargement. No evidence of active pulmonary disease. Electronically Signed   By: Lucienne Capers M.D.   On: 08/19/2015 23:02   Ct Head Wo Contrast  08/19/2015  CLINICAL DATA:  Altered mental status.  No weakness.  Code stroke. EXAM: CT HEAD WITHOUT CONTRAST TECHNIQUE: Contiguous axial images were obtained from the base of the skull through the vertex without intravenous contrast. COMPARISON:  07/16/2013 FINDINGS: Diffuse cerebral atrophy. No ventricular dilatation. Low-attenuation changes in the deep white matter consistent with small vessel ischemia. Focal encephalomalacia in the right frontal lobe consistent with old infarct. No mass effect or midline shift. No abnormal extra-axial fluid collections. Gray-white matter junctions are distinct. Basal cisterns are not effaced. No evidence of acute intracranial hemorrhage. No depressed skull fractures. Mild mucosal thickening in the paranasal sinuses. Mastoid air cells are not opacified. Vascular calcifications. Calcification and deformity of the right globe consistent with old insult. Focal increased density in the left globe likely postoperative. IMPRESSION: No acute intracranial abnormalities. Chronic atrophy and small vessel ischemic changes. Old right frontal infarct. These results were called by telephone at the time of interpretation on 08/19/2015 at 8:38 pm to Dr. Leonel Ramsay, who verbally acknowledged these results. Electronically Signed  By: Lucienne Capers M.D.   On: 08/19/2015 20:41   [name]  08/19/2015  CLINICAL DATA:  Initial valuation for acute altered mental status.  EXAM: MRI HEAD WITHOUT CONTRAST TECHNIQUE: Multiplanar, multiecho pulse sequences of the brain and surrounding structures were obtained without intravenous contrast. COMPARISON:  Prior head CT from earlier the same day P FINDINGS: Diffusion-weighted sequences demonstrate a possible tiny 3 mm cortical infarct within the left temporal lobe (series 3, image 20). No associated edema or hemorrhage. No other infarct. Largely absent flow void within the diminutive right vertebral artery, stable. Major intracranial vascular flow voids are otherwise maintained. Diffuse prominence of the CSF containing spaces is compatible with generalized age-related cerebral atrophy. Mild chronic small vessel ischemic disease in within the periventricular white matter. Encephalomalacia within the high right frontal lobe consistent with remote ischemic infarct. There is associated gliosis within this region. Multiple additional remote lacunar infarcts within the right basal ganglia involving both the caudate and lentiform nuclei. No mass lesion, midline shift, or mass effect. No hydrocephalus. No extra-axial fluid collection. Pituitary gland normal. No acute abnormality about the globes. 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 Mild mucosal thickening within the ethmoidal air cells and maxillary sinuses. No mastoid effusion. Inner ear structures normal. Bone marrow signal intensity within normal limits. No scalp soft tissue abnormality. IMPRESSION: 1. 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 2. No other acute intracranial process identified. 3. Moderate to large remote infarct within the posterior right frontal lobe with extension into the operculum. Additional remote lacunar infarcts involving the right basal ganglia. 4. Nonvisualization of the right vertebral artery, which may be occluded. This is stable relative to previous MRI from  2014. 5. Atrophy with mild chronic small vessel ischemic disease. Electronically Signed   By: Jeannine Boga M.D.   On: 08/19/2015 23:14     Medical Consultants:    None.  Anti-Infectives:   Anti-infectives    None      Subjective:    Brett Castaneda pt is very cranky with a  Headache.  Objective:    Filed Vitals:   08/20/15 0300 08/20/15 0500 08/20/15 0700 08/20/15 1002  BP: 146/91 142/97 165/88   Pulse: 70 70 76   Temp: 98.4 F (36.9 C) 97.9 F (36.6 C) 98.2 F (36.8 C) 99.6 F (37.6 C)  TempSrc: Oral Oral Oral Oral  Resp: _0 Height:      Weight:      SpO2: 96% 98% 98%     Intake/Output Summary (Last 24 hours) at 08/20/15 1014 Last data filed at 08/20/15 0547  Gross per 24 hour  Intake      0 ml  Output    855 ml  Net   -855 ml   Filed Weights   08/20/15 0100  Weight: 77.928 kg (171 lb 12.8 oz)    Exam: Gen:  NAD Cardiovascular:  RRR, No M/R/G Chest and lungs:   CTAB Abdomen:  Abdomen soft, NT/ND, + BS  Extremities:  No C/E/C   Data Reviewed:    Labs: Basic Metabolic Panel:  Recent Labs Lab 08/19/15 2035 08/19/15 2044  NA 133* 127*  K 4.0 3.8  CL 98* 98*  CO2  --  21*  GLUCOSE 119* 119*  BUN 15 12  CREATININE 1.30* 1.26*  CALCIUM  --  8.3*   GFR Estimated Creatinine Clearance: 56.4 mL/min (by  C-G formula based on Cr of 1.26). Liver Function Tests:  Recent Labs Lab 08/19/15 2044  AST 25  ALT 17  ALKPHOS 46  BILITOT 0.7  PROT 7.0  ALBUMIN 3.4*   No results for input(s): LIPASE, AMYLASE in the last 168 hours. No results for input(s): AMMONIA in the last 168 hours. Coagulation profile  Recent Labs Lab 08/19/15 2044  INR 1.13    CBC:  Recent Labs Lab 08/19/15 2035 08/19/15 2044  WBC  --  7.0  NEUTROABS  --  4.3  HGB 13.3 12.5*  HCT 39.0 36.2*  MCV  --  92.8  PLT  --  126*   Cardiac Enzymes: No results for input(s): CKTOTAL, CKMB, CKMBINDEX, TROPONINI in the last 168 hours. BNP (last 3  results) No results for input(s): PROBNP in the last 8760 hours. CBG:  Recent Labs Lab 08/19/15 2046  GLUCAP 118*   D-Dimer: No results for input(s): DDIMER in the last 72 hours. Hgb A1c: No results for input(s): HGBA1C in the last 72 hours. Lipid Profile:  Recent Labs  08/20/15 0323  CHOL 137  HDL 25*  LDLCALC 98  TRIG 72  CHOLHDL 5.5   Thyroid function studies: No results for input(s): TSH, T4TOTAL, T3FREE, THYROIDAB in the last 72 hours.  Invalid input(s): FREET3 Anemia work up: No results for input(s): VITAMINB12, FOLATE, FERRITIN, TIBC, IRON, RETICCTPCT in the last 72 hours. Sepsis Labs:  Recent Labs Lab 08/19/15 2044  WBC 7.0   Microbiology No results found for this or any previous visit (from the past 240 hour(s)).   Medications:   . acidophilus  1 capsule Oral Daily  . aspirin  300 mg Rectal Daily   Or  . aspirin  325 mg Oral Daily  . beta carotene w/minerals  1 tablet Oral Daily  . clopidogrel  75 mg Oral Daily  . enoxaparin (LOVENOX) injection  40 mg Subcutaneous Daily  . feeding supplement (ENSURE ENLIVE)  237 mL Oral BID BM  . folic acid-pyridoxine-cyancobalamin  1 tablet Oral Q Sat  . latanoprost  1 drop Left Eye QHS  . methotrexate  10 mg Oral Q Sat  . tamsulosin  0.4 mg Oral Daily  . timolol  1 drop Left Eye QPM   Continuous Infusions:   Time spent: 25 min     Charlynne Cousins  [facility] Pager [phone]  *Please refer to [email or web address], password [email or web address] to get updated schedule on who will round on this patient, as hospitalists switch teams weekly. If 7PM-7AM, please contact night-coverage at [email or web address], password [email or web address] for any overnight needs.  08/20/2015, 10:14 AM

## 2015-08-20 NOTE — Progress Notes (Signed)
  Echocardiogram 2D Echocardiogram has been performed.  [place], [place] 08/20/2015, 3:38 PM

## 2015-08-20 NOTE — Progress Notes (Signed)
EEG Completed; Results Pending  

## 2015-08-20 NOTE — H&P (Addendum)
[facility] Admission History and Physical       Brett Castaneda [id number] DOB: November 08, 1940 DOA: 08/19/2015  Referring physician: EDP PCP: Brett Cowden, MD  Specialists:   Chief Complaint: Confusion  HPI: Brett Castaneda is a 74 y.o. 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  He did not understand what she was saying to him, and had trouble with word finding.   He was brought to the ED and seen as a Code Stroke and seen by Neurologist Dr Brett Castaneda .   TPA was not given since he was outside of the TPA window.   He was evaluated and had a CT scan of the head and an MRI/MRA of the Brain which revealed a small ischemic infarct of the cortical area of the left parietal lobe.   He was referred for further evaluation.     Review of Systems: Unable to Obtain from the Patient  Past Medical History  Diagnosis Date  . BASAL CELL CARCINOMA, FACE 03/31/2009  . 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 CONSTIPATION 06/22/2008  . DIVERTICULOSIS, COLON 03/24/2007  . GERD 03/24/2007  . HYPERLIPIDEMIA 08/27/2007  . HYPERTENSION 12/14/2008  . Rheumatoid arthritis(714.0) 03/24/2007  . Blind right eye   . Retinal detachment     hx of  . Keratosis Oct. 2013  . Fall at home Nov. 5, 2014  06-28-13    in the home and Outside as well  . 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 Stroke [facility]) Oct 2014  . 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 Total knee arthroplasty      bital  . Carpal tunnel release Right 2003  . Cataract extraction  1980    removed cataract in left eye. Currently blind in right eye  . Cholecystectomy   2001    Gall Bladder  . Retinal detachment surgery  2004  . Lumbar puncture  Oct. 21, 2014  . Nerve conduction    . Mri  07-14-13  . Carotid endarterectomy Right 08-15-07    cea  . Joint replacement  2000    Right knee replacement  . Eye surgery  [id number]    retinal detachment  . Eye surgery      left cataract surgery  . Shoulder surgery Left 2009    hx "frozen" shoulder      Prior to Admission medications   Medication Sig Start Date End Date Taking? Authorizing Provider  acetaminophen (TYLENOL) 500 MG tablet Take 500 mg by mouth every 6 (six) hours as needed for fever.   Yes Historical Provider, MD  alendronate (FOSAMAX) 70 MG tablet Take 70 mg by mouth once a week. Take with a full glass of water on an empty stomach. Takes on Wednesday   Yes Historical Provider, MD  clopidogrel (PLAVIX) 75 MG tablet Take 1 tablet (75 mg total) by mouth daily. 11/02/14  Yes Marletta Lor, MD  folic acid-pyridoxine-cyancobalamin (VIRT-VITE FORTE) 2.5-25-2 MG TABS Take 1 tablet by mouth once a week. Takes on days he takes methotrexate. (Saturdays)   Yes Historical Provider, MD  latanoprost (XALATAN) 0.005 %  ophthalmic solution Place 1 drop into the left eye at bedtime.    Yes Historical Provider, MD  methotrexate (RHEUMATREX) 2.5 MG tablet Take 10 mg by mouth once a week. Caution:Chemotherapy. Protect from light.   Yes Historical Provider, MD  Multiple Vitamins-Minerals (OCUVITE ADULT 50+) CAPS Take 1 capsule by mouth daily.   Yes Historical Provider, MD  polyethylene glycol (MIRALAX / GLYCOLAX) packet Take 17 g by mouth daily as needed (for constipation).   Yes Historical Provider, MD  Probiotic Product (PROBIOTIC DAILY PO) Take 1 tablet by mouth daily.   Yes Historical Provider, MD  sodium chloride (OCEAN) 0.65 % SOLN nasal spray Place 1 spray into both nostrils 3 (three) times daily.   Yes Historical Provider, MD  tamsulosin (FLOMAX) 0.4 MG CAPS capsule Take 0.4 mg by mouth daily. 08/10/15  Yes  Historical Provider, MD  timolol (TIMOPTIC) 0.5 % ophthalmic solution Place 1 drop into the left eye every evening.    Yes Historical Provider, MD  cephALEXin (KEFLEX) 500 MG capsule Take 1 capsule (500 mg total) by mouth 3 (three) times daily. Patient not taking: Reported on 08/19/2015 08/11/15   Lucretia Kern, DO     Allergies  Allergen Reactions  . Dopamine Anaphylaxis  . Morphine And Related Other (See Comments)    Hallucinations, very bad reactions  . Vicodin [Hydrocodone-Acetaminophen] Nausea And Vomiting    Social History:  reports that he quit smoking about 34 years ago. His smoking use included Cigarettes. He quit after 23 years of use. He has never used smokeless tobacco. He reports that he does not drink alcohol or use illicit drugs.    Family History  Problem Relation Age of Onset  . Stroke Mother 5  . Hypertension Mother   . Heart disease Father   . Aneurysm Father   . Coronary artery disease Sister   . Pneumonia Sister   . Heart disease Sister   . Diabetes Sister   . Hypertension Sister   . Colon cancer Neg Hx        Physical Exam:  GEN:  Pleasant Elderly Thin Well Developed 74 y.o. Caucasian male examined and in no acute distress; cooperative with exam Filed Vitals:   08/19/15 2330 08/19/15 2345 08/20/15 0000 08/20/15 0030  BP: 119/91 136/81 138/83 131/90  Pulse: 74 72 73 72  Temp:      TempSrc:      Resp: 16 14 13 17   SpO2: 96% 96% 95% 96%   Blood pressure 131/90, pulse 72, temperature 100.3 F (37.9 C), temperature source Rectal, resp. rate 17, SpO2 96 %. PSYCH: He is alert and oriented x4; does not appear anxious does not appear depressed; affect is normal HEENT: Normocephalic and Atraumatic, Mucous membranes pink; PERRLA; EOM intact; Fundi:  Benign;  No scleral icterus, Nares: Patent, Oropharynx: Clear, Fair Dentition,    Neck:  FROM, No Cervical Lymphadenopathy nor Thyromegaly or Carotid Bruit; No JVD; Breasts:: Not examined CHEST WALL: No  tenderness CHEST: Normal respiration, clear to auscultation bilaterally HEART: Regular rate and rhythm; no murmurs rubs or gallops BACK: No kyphosis or scoliosis; No CVA tenderness ABDOMEN: Positive Bowel Sounds, Soft Non-Tender, No Rebound or Guarding; No Masses, No Organomegaly. Rectal Exam: Not done EXTREMITIES: No Cyanosis, Clubbing, or Edema; No Ulcerations. Genitalia: not examined PULSES: 2+ and symmetric SKIN: Normal hydration no rash or ulceration CNS:  Alert and Oriented x 4, No Focal Deficits Mental Status:  Alert, Oriented, Thought Content Appropriate. Speech Fluent without evidence of Aphasia. Able  to follow 3 step commands without difficulty.  In No obvious pain.   Cranial Nerves:  II: Discs flat bilaterally; Visual fields Intact,  Pupils equal and reactive.    III,IV, VI: Extra-ocular motions intact bilaterally    V,VII: smile symmetric, facial light touch sensation normal bilaterally    VIII: hearing intact bilaterally    IX,X: gag reflex present    XI: bilateral shoulder shrug    XII: midline tongue extension   Motor:  Right:  Upper extremity 5/5     Left:  Upper extremity 4/5     Right:  Lower extremity 5/5    Left:  Lower extremity 4/5     Tone and Bulk:  normal tone throughout; no atrophy noted   Sensory:  Pinprick and light touch intact throughout, bilaterally   Deep Tendon Reflexes: 2+ and symmetric throughout   Plantars/ Babinski:  Right: normal Left: equivocal   Cerebellar:  Finger to nose without difficulty.   Gait: deferred    Vascular: pulses palpable throughout    Labs on Admission:  Basic Metabolic Panel:  Recent Labs Lab 08/19/15 2035 08/19/15 2044  NA 133* 127*  K 4.0 3.8  CL 98* 98*  CO2  --  21*  GLUCOSE 119* 119*  BUN 15 12  CREATININE 1.30* 1.26*  CALCIUM  --  8.3*   Liver Function Tests:  Recent Labs Lab 08/19/15 2044  AST 25  ALT 17  ALKPHOS 46  BILITOT 0.7  PROT 7.0  ALBUMIN 3.4*   No results for input(s): LIPASE,  AMYLASE in the last 168 hours. No results for input(s): AMMONIA in the last 168 hours. CBC:  Recent Labs Lab 08/19/15 2035 08/19/15 2044  WBC  --  7.0  NEUTROABS  --  4.3  HGB 13.3 12.5*  HCT 39.0 36.2*  MCV  --  92.8  PLT  --  126*   Cardiac Enzymes: No results for input(s): CKTOTAL, CKMB, CKMBINDEX, TROPONINI in the last 168 hours.  BNP (last 3 results) No results for input(s): BNP in the last 8760 hours.  ProBNP (last 3 results) No results for input(s): PROBNP in the last 8760 hours.  CBG:  Recent Labs Lab 08/19/15 2046  GLUCAP 118*    Radiological Exams on Admission: Dg Chest 2 View  08/19/2015  CLINICAL DATA:  Stroke symptoms tonight. Fever. Hypertension. Ex-smoker. EXAM: CHEST  2 VIEW COMPARISON:  10/28/2013 FINDINGS: Mild cardiac enlargement without vascular congestion. Interstitial changes in the lung bases similar to previous study, likely fibrosis. No focal airspace disease. No blunting of costophrenic angles. No pneumothorax. Calcified and tortuous aorta. Surgical clips in the right upper quadrant. 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 Mild cardiac enlargement. No evidence of active pulmonary disease. Electronically Signed   By: Lucienne Capers M.D.   On: 08/19/2015 23:02   Ct Head Wo Contrast  08/19/2015  CLINICAL DATA:  Altered mental status.  No weakness.  Code stroke. EXAM: CT HEAD WITHOUT CONTRAST TECHNIQUE: Contiguous axial images were obtained from the base of the skull through the vertex without intravenous contrast. COMPARISON:  07/16/2013 FINDINGS: Diffuse cerebral atrophy. No ventricular dilatation. Low-attenuation changes in the deep white matter consistent with small vessel ischemia. Focal encephalomalacia in the right frontal lobe consistent with old infarct. No mass effect or midline shift. No abnormal extra-axial fluid collections. Gray-white matter junctions are distinct. Basal cisterns are not effaced. No evidence of acute intracranial  hemorrhage. No depressed skull fractures. Mild mucosal thickening in the paranasal sinuses. Mastoid  air cells are not opacified. Vascular calcifications. Calcification and deformity of the right globe consistent with old insult. Focal increased density in the left globe likely postoperative. IMPRESSION: No acute intracranial abnormalities. Chronic atrophy and small vessel ischemic changes. Old right frontal infarct. These results were called by telephone at the time of interpretation on 08/19/2015 at 8:38 pm to Dr. Leonel Castaneda, who verbally acknowledged these results. Electronically Signed   By: Lucienne Capers M.D.   On: 08/19/2015 20:41   [name]  08/19/2015  CLINICAL DATA:  Initial valuation for acute altered mental status. EXAM: MRI HEAD WITHOUT CONTRAST TECHNIQUE: Multiplanar, multiecho pulse sequences of the brain and surrounding structures were obtained without intravenous contrast. COMPARISON:  Prior head CT from earlier the same day P FINDINGS: Diffusion-weighted sequences demonstrate a possible tiny 3 mm cortical infarct within the left temporal lobe (series 3, image 20). No associated edema or hemorrhage. No other infarct. Largely absent flow void within the diminutive right vertebral artery, stable. Major intracranial vascular flow voids are otherwise maintained. Diffuse prominence of the CSF containing spaces is compatible with generalized age-related cerebral atrophy. Mild chronic small vessel ischemic disease in within the periventricular white matter. Encephalomalacia within the high right frontal lobe consistent with remote ischemic infarct. There is associated gliosis within this region. Multiple additional remote lacunar infarcts within the right basal ganglia involving both the caudate and lentiform nuclei. No mass lesion, midline shift, or mass effect. No hydrocephalus. No extra-axial fluid collection. Pituitary gland normal. No acute abnormality about the globes. Postoperative  changes related to prior treatment for retinal detachment again noted about both globes. Mild mucosal thickening within the ethmoidal air cells and maxillary sinuses. No mastoid effusion. Inner ear structures normal. Bone marrow signal intensity within normal limits. No scalp soft tissue abnormality. IMPRESSION: 1. 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 2. No other acute intracranial process identified. 3. Moderate to large remote infarct within the posterior right frontal lobe with extension into the operculum. Additional remote lacunar infarcts involving the right basal ganglia. 4. Nonvisualization of the right vertebral artery, which may be occluded. This is stable relative to previous MRI from 2014. 5. Atrophy with mild chronic small vessel ischemic disease. Electronically Signed   By: Jeannine Boga M.D.   On: 08/19/2015 23:14           Assessment/Plan:     74 y.o. male with  Principal Problem:   1.    CVA (cerebral infarction)   CVA workup   ASA Rx and continue Palvix   Neuro seen in ED   PT/OT/Speech Consults in AM   Active Problems:   2.    Hyponatremia   Send Urine Electrolytes and OSM   IVFs   Monitor Na+level       3.    Anemia due to other cause   Anemia Panel in AM   FOBT q day x 3     4.    Thrombocytopenia ([place])   Monitor Trend     5.    Dyslipidemia   Continue Pravastatin Rx     6.    Essential hypertension   Monitor BPs     7.    Rheumatoid arthritis (H[facility])   On Methotrexate Rx and Folic Acid     8.    ALS (amyotrophic lateral sclerosis) ([facility])   Chronic     9.    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   DVT Prophylaxis   Lovenox    11.    Fever - Monitor   Blood Cx x 2 sent   Influenzae PCR sent   UA negative    Code Status:     FULL CODE   Family Communication:   Wife at Bedside     Disposition Plan:    Observation Status        Time spent:  31  Minutes      Theressa Millard [facility] Pager [phone]   If [place] Please Contact the Day Rounding Team MD for [facility]  If 7PM-7AM, Please Contact Night-Floor Coverage  [email or web address] Password [email or web address] 08/20/2015, 1:13 AM     ADDENDUM:   Patient was seen and examined on 08/20/2015

## 2015-08-20 NOTE — Progress Notes (Signed)
Pt arrived on unit 0100 hrs, A&O x3, NIHSS 1, no obvious distress, on call paged/notified of pt arrival, Ppt oriented to room and equipment, admission orders implemented.

## 2015-08-20 NOTE — Progress Notes (Signed)
STROKE TEAM PROGRESS NOTE   HISTORY Brett Castaneda is a 74 y.o. male with a history of previous stroke who presents with increasing confusion today. His wife states that he was in his normal state of health around 4:30 PM, and then subsequently began becoming progressively more confused. Finally when she tried to get him up, she noticed that he was "unable to move" anywhere. He was very tremulous, but not in any particular area.  Of note, he has not been eating or drinking well for the past few days and has been feeling "crummy" and has had the sensation of urinary urgency 5 times within the past couple of hours. He was also just on Keflex for left arm cellulitis which has appeared to resolve.   LKW: 4:30 PM tpa given?: no, mild symptoms   SUBJECTIVE (INTERVAL HISTORY) The patient's wife is at the bedside. Patient is pleasant but remains mildly confused.   OBJECTIVE Temp:  [97.9 F (36.6 C)-102 F (38.9 C)] 98.2 F (36.8 C) (11/26 0700) Pulse Rate:  [70-91] 76 (11/26 0700) Cardiac Rhythm:  [-] Normal sinus rhythm (11/26 0700) Resp:  [10-21] 18 (11/26 0700) BP: (119-179)/(81-130) 165/88 mmHg (11/26 0700) SpO2:  [95 %-100 %] 98 % (11/26 0700) Weight:  [77.928 kg (171 lb 12.8 oz)] 77.928 kg (171 lb 12.8 oz) (11/26 0100)  CBC:  Recent Labs Lab 08/19/15 2035 08/19/15 2044  WBC  --  7.0  NEUTROABS  --  4.3  HGB 13.3 12.5*  HCT 39.0 36.2*  MCV  --  92.8  PLT  --  126*    Basic Metabolic Panel:  Recent Labs Lab 08/19/15 2035 08/19/15 2044  NA 133* 127*  K 4.0 3.8  CL 98* 98*  CO2  --  21*  GLUCOSE 119* 119*  BUN 15 12  CREATININE 1.30* 1.26*  CALCIUM  --  8.3*    Lipid Panel:    Component Value Date/Time   CHOL 137 08/20/2015 0323   TRIG 72 08/20/2015 0323   HDL 25* 08/20/2015 0323   CHOLHDL 5.5 08/20/2015 0323   VLDL 14 08/20/2015 0323   LDLCALC 98 08/20/2015 0323   HgbA1c:  Lab Results  Component Value Date   HGBA1C 5.3 07/07/2013   Urine Drug Screen:     Component Value Date/Time   LABOPIA NONE DETECTED 08/19/2015 2052   COCAINSCRNUR NONE DETECTED 08/19/2015 2052   LABBENZ NONE DETECTED 08/19/2015 2052   AMPHETMU NONE DETECTED 08/19/2015 2052   THCU NONE DETECTED 08/19/2015 2052   LABBARB NONE DETECTED 08/19/2015 2052      IMAGING  Dg Chest 2 View 08/19/2015   Fibrosis in the lung bases. Mild cardiac enlargement. No evidence of active pulmonary disease.    Ct Head Wo Contrast 08/19/2015   No acute intracranial abnormalities. Chronic atrophy and small vessel ischemic changes. Old right frontal infarct.     Brett Brain Wo Contrast 08/19/2015   1. 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  2. No other acute intracranial process identified.  3. Moderate to large remote infarct within the posterior right frontal lobe with extension into the operculum. Additional remote lacunar infarcts involving the right basal ganglia.  4. Nonvisualization of the right vertebral artery, which may be occluded. This is stable relative to previous MRI from 2014.  5. Atrophy with mild chronic small vessel ischemic disease.     PHYSICAL EXAM NEUROLOGIC:   MENTAL STATUS: awake, alert, language fluent,  follows simple commands  but oriented only to person and place. Unable to recall recent holiday. CRANIAL NERVES: Blind OD, facial sensation and strength symmetric, uvula midlinec, tongue midline MOTOR: normal bulk and tone, Strength - Right - 5/5     Left - 4+/5 SENSORY: normal and symmetric to light touch  CEREBELLAR: finger-nose-finger normal - heel to shin normal  GAIT: Deferred at this time    ASSESSMENT/PLAN Brett. TIP Castaneda is a 75 y.o. male with history of a previous stroke with residual mild left hemiparesis, hypertension, hyperlipidemia, status post right carotid endarterectomy, and ALS presenting with altered mental status. He did not receive IV t-PA due to mild deficits.  Stroke:  Dominant infarct  possibly embolic from an unknown source.  Resultant altered mental status  MRI Question tiny 3 mm acute ischemic cortical infarct within the left temporal lobe.   MRA  not performed  Carotid Doppler - Preliminary report: 1-39% ICA stenosis. Right CEA is patent. Vertebral artery flow is antegrade.   2D Echo - pending  LDL - 98  HgbA1c pending  VTE prophylaxis - Lovenox  Diet Heart Room service appropriate?: Yes; Fluid consistency:: Thin  clopidogrel 75 mg daily prior to admission, now on aspirin 81 mg daily and clopidogrel 75 mg daily  Patient counseled to be compliant with his antithrombotic medications  Ongoing aggressive stroke risk factor management  Therapy recommendations: Pending  Disposition: Pending  Hypertension  Stable  Permissive hypertension (OK if < 220/120) but gradually normalize in 5-7 days  Hyperlipidemia  Home meds:  No lipid-lowering medications prior to admission  LDL 98, goal < 70  Now on Lipitor 40 mg daily   Continue statin at discharge    Other Stroke Risk Factors  Advanced age  Cigarette smoker, quit smoking 34 years ago.  Hx stroke/TIA  Family hx stroke (Mother)   Other Active Problems  Mild anemia - mild thrombocytopenia  Mild hyponatremia  Mild renal insufficiency  Low grade fever on admission - Blood cultures pending  Hospital day #   Mikey Bussing PA-C [facility] Pager [phone] 08/20/2015, 2:00 PM     To contact Stroke Continuity provider, please refer to [email or web address] After hours, contact General Neurology

## 2015-08-20 NOTE — ED Notes (Signed)
Dr. Arnoldo Morale bedside

## 2015-08-20 NOTE — Progress Notes (Signed)
PT Cancellation Note  Patient Details Name: RUSELL CHOAT MRN: [id number] DOB: 07/23/1941   Cancelled Treatment:    Reason Eval/Treat Not Completed: Other (comment) (PT order specifies to "start tomorrow").  Will complete PT eval tomorrow.   Joslyn Hy PT, DPT [phone] Pager: [phone]  08/20/2015, 8:56 AM

## 2015-08-20 NOTE — Progress Notes (Addendum)
PT Cancellation Note  Patient Details Name: Brett Castaneda MRN: [id number] DOB: Oct 23, 1940   Cancelled Treatment:    Reason Eval/Treat Not Completed: Patient at procedure or test/unavailable (Pt having 2D echo.) Will follow up tomorrow.   [name] 08/20/2015, 3:02 PM [employer] PT [phone]

## 2015-08-20 NOTE — Progress Notes (Signed)
VASCULAR LAB PRELIMINARY  PRELIMINARY  PRELIMINARY  PRELIMINARY  Carotid duplex completed.    Preliminary report:  1-39% ICA stenosis.  Right CEA is patent.  Vertebral artery flow is antegrade.   Yong Wahlquist, RVT 08/20/2015, 9:25 AM

## 2015-08-21 DIAGNOSIS — I639 Cerebral infarction, unspecified: Secondary | ICD-10-CM | POA: Insufficient documentation

## 2015-08-21 DIAGNOSIS — M05721 Rheumatoid arthritis with rheumatoid factor of right elbow without organ or systems involvement: Secondary | ICD-10-CM

## 2015-08-21 DIAGNOSIS — R4182 Altered mental status, unspecified: Secondary | ICD-10-CM

## 2015-08-21 DIAGNOSIS — I638 Other cerebral infarction: Secondary | ICD-10-CM

## 2015-08-21 LAB — CBC
HEMATOCRIT: 36 % — AB (ref 39.0–52.0)
HEMOGLOBIN: 12.3 g/dL — AB (ref 13.0–17.0)
MCH: 31.8 pg (ref 26.0–34.0)
MCHC: 34.2 g/dL (ref 30.0–36.0)
MCV: 93 fL (ref 78.0–100.0)
Platelets: 139 10*3/uL — ABNORMAL LOW (ref 150–400)
RBC: 3.87 MIL/uL — ABNORMAL LOW (ref 4.22–5.81)
RDW: 13.8 % (ref 11.5–15.5)
WBC: 7.5 10*3/uL (ref 4.0–10.5)

## 2015-08-21 LAB — BASIC METABOLIC PANEL
ANION GAP: 5 (ref 5–15)
BUN: 14 mg/dL (ref 6–20)
CALCIUM: 8.4 mg/dL — AB (ref 8.9–10.3)
CO2: 27 mmol/L (ref 22–32)
Chloride: 102 mmol/L (ref 101–111)
Creatinine, Ser: 1.38 mg/dL — ABNORMAL HIGH (ref 0.61–1.24)
GFR calc Af Amer: 57 mL/min — ABNORMAL LOW (ref >60)
GFR calc non Af Amer: 50 mL/min — ABNORMAL LOW (ref >60)
GLUCOSE: 105 mg/dL — AB (ref 65–99)
Potassium: 3.6 mmol/L (ref 3.5–5.1)
Sodium: 134 mmol/L — ABNORMAL LOW (ref 135–145)

## 2015-08-21 MED ORDER — ALUM & MAG HYDROXIDE-SIMETH 200-200-20 MG/5ML PO SUSP: 30.0000 mL | ORAL | Status: DC | PRN

## 2015-08-21 MED ORDER — PANTOPRAZOLE SODIUM 40 MG PO TBEC
40.0000 mg | DELAYED_RELEASE_TABLET | Freq: Every day | ORAL | Status: DC
  Administered 2015-08-21 – 2015-08-23 (×3): 40 mg via ORAL
  Filled 2015-08-21 (×3): qty 1

## 2015-08-21 MED ORDER — FAMOTIDINE 20 MG PO TABS
20.0000 mg | ORAL_TABLET | Freq: Once | ORAL | Status: AC
  Administered 2015-08-21: 20 mg via ORAL
  Filled 2015-08-21: qty 1

## 2015-08-21 MED ORDER — ASPIRIN 81 MG PO TBEC: 81.0000 mg | DELAYED_RELEASE_TABLET | Freq: Every day | ORAL | Status: DC

## 2015-08-21 NOTE — Progress Notes (Signed)
STROKE TEAM PROGRESS NOTE   HISTORY Brett Castaneda is a 74 y.o. male with a history of previous stroke who presents with increasing confusion today. His wife states that he was in his normal state of health around 4:30 PM, and then subsequently began becoming progressively more confused. Finally when she tried to get him up, she noticed that he was "unable to move" anywhere. He was very tremulous, but not in any particular area.  Of note, he has not been eating or drinking well for the past few days and has been feeling "crummy" and has had the sensation of urinary urgency 5 times within the past couple of hours. He was also just on Keflex for left arm cellulitis which has appeared to resolve.   LKW: 4:30 PM tpa given?: no, mild symptoms   SUBJECTIVE (INTERVAL HISTORY) Family at bedside. Mental status changes resolved.    OBJECTIVE Temp:  [97.6 F (36.4 C)-99.1 F (37.3 C)] 98.5 F (36.9 C) (11/27 1312) Pulse Rate:  [72-84] 79 (11/27 1312) Cardiac Rhythm:  [-] Normal sinus rhythm (11/27 0700) Resp:  [18] 18 (11/27 1312) BP: (136-151)/(79-98) 137/79 mmHg (11/27 1312) SpO2:  [98 %-99 %] 99 % (11/27 1312)  CBC:   Recent Labs Lab 08/19/15 2044 08/21/15 1337  WBC 7.0 7.5  NEUTROABS 4.3  --   HGB 12.5* 12.3*  HCT 36.2* 36.0*  MCV 92.8 93.0  PLT 126* 139*    Basic Metabolic Panel:   Recent Labs Lab 08/19/15 2044 08/21/15 1337  NA 127* 134*  K 3.8 3.6  CL 98* 102  CO2 21* 27  GLUCOSE 119* 105*  BUN 12 14  CREATININE 1.26* 1.38*  CALCIUM 8.3* 8.4*    Lipid Panel:     Component Value Date/Time   CHOL 137 08/20/2015 0323   TRIG 72 08/20/2015 0323   HDL 25* 08/20/2015 0323   CHOLHDL 5.5 08/20/2015 0323   VLDL 14 08/20/2015 0323   LDLCALC 98 08/20/2015 0323   HgbA1c:  Lab Results  Component Value Date   HGBA1C 5.3 07/07/2013   Urine Drug Screen:     Component Value Date/Time   LABOPIA NONE DETECTED 08/19/2015 2052   COCAINSCRNUR NONE DETECTED 08/19/2015  2052   LABBENZ NONE DETECTED 08/19/2015 2052   AMPHETMU NONE DETECTED 08/19/2015 2052   THCU NONE DETECTED 08/19/2015 2052   LABBARB NONE DETECTED 08/19/2015 2052      IMAGING  Dg Chest 2 View 08/19/2015   Fibrosis in the lung bases. Mild cardiac enlargement. No evidence of active pulmonary disease.    Ct Head Wo Contrast 08/19/2015   No acute intracranial abnormalities. Chronic atrophy and small vessel ischemic changes. Old right frontal infarct.     [name] 08/19/2015   1. 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  2. No other acute intracranial process identified.  3. Moderate to large remote infarct within the posterior right frontal lobe with extension into the operculum. Additional remote lacunar infarcts involving the right basal ganglia.  4. Nonvisualization of the right vertebral artery, which may be occluded. This is stable relative to previous MRI from 2014.  5. Atrophy with mild chronic small vessel ischemic disease.     PHYSICAL EXAM NEUROLOGIC:   Physical exam: Exam: Gen: NAD, conversant                CV: RRR, no MRG. No Carotid Bruits. No peripheral edema, warm, nontender Eyes: Conjunctivae clear without exudates  or hemorrhage  Neuro: Detailed Neurologic Exam  Speech:    Speech is normal; fluent and spontaneous with normal comprehension.  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 Left pupil is reactive with intact vual fields. Extraocular movements are intact in the left eye. Trigeminal sensation is intact and the muscles of mastication are normal. Mild facial weakness on smile with right NL flattening. The palate elevates in the midline. Hearing intact to voice. Voice is normal. Shoulder shrug is normal. The tongue has normal motion  without fasciculations.    Motor Observation: no involuntary movements noted. Distal atrophy in the extremities Tone:    Normal muscle tone.     Strength:    Strength is largely intact in the UE proximally (some chronic mild left arm weakness) with symmetric distal weakness most pronounced in the intrinsic hand muscles. Can lift both legs simultaneously off of the bed and hold there easily. Some distal weakness in the lower extremities most pronounced in the DF 3/5. These are chronic.     Sensation: intact to LT     Reflex Exam:  DTR's:    Absent AJs otherwise deep tendon reflexes in the upper and lower extremities are brisk bilaterally.   Toes:    The toes are equivocal bilaterally.  Question left babinski.     ASSESSMENT/PLAN Mr. Brett Castaneda is a 73 y.o. male with history of a previous stroke with residual mild left hemiparesis, hypertension, hyperlipidemia, status post right carotid endarterectomy, and ALS presenting with altered mental status. He did not receive IV t-PA due to mild deficits.  Stroke:  Dominant infarct possibly embolic from an unknown source.  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   Consider TEE and loop recorder placement. Will make NPO and defer to the stroke team Monday for inpatient or outpatient TEE.   MRA  not performed. Consider CTA of the head.  Carotid Doppler - Preliminary report: 1-39% ICA stenosis. Right CEA is patent. Vertebral artery flow is antegrade.   2D Echo - EF 60-65%. No cardiac source of emboli identified.  LDL - 98  HgbA1c pending  VTE prophylaxis - Lovenox Diet Heart Room service appropriate?: Yes; Fluid consistency:: Thin  clopidogrel 75 mg daily prior to admission, now on aspirin 81 mg daily and clopidogrel 75 mg daily   Patient counseled to be compliant with his antithrombotic medications  Ongoing aggressive stroke risk factor  management  Therapy recommendations: Home health physical therapy recommended.  Disposition: Pending  Hypertension  Stable Permissive hypertension (OK if < 220/120) but gradually normalize in 5-7 days  Hyperlipidemia  Home meds:  No lipid-lowering medications prior to admission  LDL 98, goal < 70  Now on Lipitor 40 mg daily   Continue statin at discharge    Other Stroke Risk Factors  Advanced age  Cigarette smoker, quit smoking 34 years ago.  Hx stroke/TIA  Family hx stroke (Mother)   Other Active Problems  Mild anemia - mild thrombocytopenia  Mild hyponatremia  Mild renal insufficiency  Low grade fever on admission - Blood cultures - no growth so far.  Hospital day # 1   Personally examined patient and images, personally conducted neuro exam,documented assessment and plan as stated above. I have personally obtained the history,  evaluated lab data, reviewed imaging studies and agree with radiology interpretations.    Sarina Ill, MD Stroke Neurology [phone] [facility]      To contact Stroke Continuity provider, please refer to [email or web address] After hours, contact General Neurology

## 2015-08-21 NOTE — Procedures (Signed)
ELECTROENCEPHALOGRAM REPORT  Patient: Brett Castaneda       Room #: [id number] EEG No. ID: [id number] Age: 74 y.o.        Sex: male Referring Physician: Aileen Fass, A Report Date:  08/21/2015        Interpreting Physician: Anthony Sar  History: Brett Castaneda is an 74 y.o. male with a history of previous strokes, hypertension, hyperlipidemia, metoprolol arthritis and motor neuron disease, admitted with recurrent small left parietal stroke as well as mental status changes.  Indications for study:  Assess severity of encephalopathy; rule out seizure activity.  Technique: This is an 18 channel routine scalp EEG performed at the bedside with bipolar and monopolar montages arranged in accordance to the international 10/20 system of electrode placement.   Description: This EEG recording was performed during wakefulness. Background activity consisted of low amplitude diffuse irregular delta activity with superimposed diffuse 6-7 Hz theta activity which was somewhat rhythmic posteriorly. Photic stimulation and hyperventilation were not performed. No epileptiform discharges were recorded.  Interpretation: This EEG is abnormal with mild generalized nonspecific reticular slowing of cerebral activity. This pattern of slowing can be seen with metabolic and toxic encephalopathies, as well as degenerative CNS disorders. No evidence of an epileptic disorder was demonstrated.   Rush Farmer M.D. [facility] [phone]

## 2015-08-21 NOTE — Progress Notes (Signed)
SLP Cancellation Note  Patient Details Name: Brett Castaneda MRN: [id number] DOB: Mar 26, 1941   Cancelled treatment:       Reason Eval/Treat Not Completed: Patient at procedure or test/unavailable   ADAMS,PAT, M.S., CCC-SLP 08/21/2015, 12:57 PM

## 2015-08-21 NOTE — Discharge Summary (Signed)
Physician Discharge Summary  Brett Castaneda [name] DOB: 14-May-1941 DOA: 08/19/2015  PCP: Nyoka Cowden, MD  Admit date: 08/19/2015 Discharge date: 08/23/2015  Time spent:35 minutes  Recommendations for Outpatient Follow-up:  1. Follow-up with neurology in 2-4 weeks.  2. We'll go home with physical therapy. 3. Follow-up with cardiology as an outpatient Holter monitor.   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   Hospital Course:  Acute CVA: A1c   5.5, his HDL was less than 40 LDL less than 98 he was continued statins. MRI of the brain without contrast showed a 3 mm acute ischemic cortical infarct with left temporal lobe. Physical therapy recommended home physical therapy. The 2-D echo was done that showed an ejection fraction of 55% with no source of thrombus a carotid Doppler show no ICA stenosis bilaterally. Neurology recommended an EEG that showed nonspecific reticular slowing of the cerebral activity, he also recommended to continue patient on aspirin and Plavix.  Hyponatremia: Resolved with IV hydration.  Dyslipidemia: Cont statins.  Essential  hypertension: Initially his antihypertensive medications were held to allow permissive hypertension.  No changes were made to his medication they will be resume as an outpatient.  Arthritis: Continue Magic tricks and folic acid.  Anemia due to other causes: Anemia panel is pending she he will have to follow-up with PCP as an outpatient.  Mild thrombocytopenia: Mild appears to be chronic has been improving throughout his hospital stay.  Procedures:  CT of the head 11.25.2016  Chest x-ray11.25.2016  Brain MRI 11.25.2016  2-D echo 11.26.2016  Carotid Doppler 11.26.2016    Consultations:  Neurology  Discharge Exam: Filed Vitals:   08/23/15 0551 08/23/15 1028  BP: 158/96 144/88  Pulse: 72 74  Temp: 98.5 F (36.9 C) 98.1 F (36.7 C)  Resp: 18 16    General: A&O x3 Cardiovascular: RRR Respiratory: good air movement CTA B/L  Discharge Instructions   Discharge Instructions    Ambulatory referral to Neurology    Complete by:  As directed   Please schedule post stroke follow up in 2 months. - has seen DR. Patel in the past but pt and family do not want to see her again. Last saw her in 2014,     Diet - low sodium heart healthy    Complete by:  As directed      Increase activity slowly    Complete by:  As directed           Current Discharge Medication List    START taking these medications   Details  aspirin EC 81 MG EC tablet Take 1 tablet (81 mg total) by mouth daily. Qty: 30 tablet, Refills: 0  CONTINUE these medications which have NOT CHANGED   Details  acetaminophen (TYLENOL) 500 MG tablet Take 500 mg by mouth every 6 (six) hours as needed for fever.    alendronate (FOSAMAX) 70 MG tablet Take 70 mg by mouth once a week. Take with a full glass of water on an empty stomach. Takes on Wednesday    clopidogrel (PLAVIX) 75 MG tablet Take 1 tablet (75 mg total) by mouth daily. Qty: 90 tablet, Refills: 4    folic acid-pyridoxine-cyancobalamin  (VIRT-VITE FORTE) 2.5-25-2 MG TABS Take 1 tablet by mouth once a week. Takes on days he takes methotrexate. (Saturdays)    latanoprost (XALATAN) 0.005 % ophthalmic solution Place 1 drop into the left eye at bedtime.     methotrexate (RHEUMATREX) 2.5 MG tablet Take 10 mg by mouth once a week. Caution:Chemotherapy. Protect from light.    Multiple Vitamins-Minerals (OCUVITE ADULT 50+) CAPS Take 1 capsule by mouth daily.    polyethylene glycol (MIRALAX / GLYCOLAX) packet Take 17 g by mouth daily as needed (for constipation).    Probiotic Product (PROBIOTIC DAILY PO) Take 1 tablet by mouth daily.    sodium chloride (OCEAN) 0.65 % SOLN nasal spray Place 1 spray into both nostrils 3 (three) times daily.    tamsulosin (FLOMAX) 0.4 MG CAPS capsule Take 0.4 mg by mouth daily. Refills: 0    timolol (TIMOPTIC) 0.5 % ophthalmic solution Place 1 drop into the left eye every evening.       STOP taking these medications     cephALEXin (KEFLEX) 500 MG capsule        Allergies  Allergen Reactions  . Dopamine Anaphylaxis  . Morphine And Related Other (See Comments)    Hallucinations, very bad reactions  . Vicodin [Hydrocodone-Acetaminophen] Nausea And Vomiting   Follow-up Information    Follow up with SETHI,PRAMOD, MD In 3 weeks.   Specialties:  Neurology, Radiology   Why:  Stroke Clinic, Office will call you with appointment date & time   Contact information:   [place] [address] [phone]       Follow up with [facility].   Specialty:  Cardiology   Why:  [facility] - office will call you to arrange a 30-day heart monitor to wear to watch for heart rhythm issues that may have caused your stroke. Call office if you have not heard back in 3 days.   Contact information:   [address] [place] [phone]       The results of significant diagnostics from this hospitalization (including  imaging, microbiology, ancillary and laboratory) are listed below for reference.    Significant Diagnostic Studies: Dg Chest 2 View  08/19/2015  CLINICAL DATA:  Stroke symptoms tonight. Fever. Hypertension. Ex-smoker. EXAM: CHEST  2 VIEW COMPARISON:  10/28/2013 FINDINGS: Mild cardiac enlargement without vascular congestion. Interstitial changes in the lung bases similar to previous study, likely fibrosis. No focal airspace disease. No blunting of costophrenic angles. No pneumothorax. Calcified and tortuous aorta. Surgical clips in the right upper quadrant. 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 Mild cardiac enlargement. No evidence of active pulmonary disease. Electronically Signed   By: Lucienne Capers M.D.   On: 08/19/2015 23:02   Ct Head Wo Contrast  08/19/2015  CLINICAL DATA:  Altered mental status.  No weakness.  Code stroke. EXAM: CT HEAD WITHOUT CONTRAST TECHNIQUE: Contiguous axial images were obtained from the base of the skull through  the vertex without intravenous contrast. COMPARISON:  07/16/2013 FINDINGS: Diffuse cerebral atrophy. No ventricular dilatation. Low-attenuation changes in the deep white matter consistent with small vessel ischemia. Focal encephalomalacia in the right frontal lobe consistent with old infarct. No mass effect or midline shift. No abnormal extra-axial fluid collections. Gray-white matter junctions are distinct. Basal cisterns are not effaced. No evidence of acute intracranial hemorrhage. No depressed skull fractures. Mild mucosal thickening in the paranasal sinuses. Mastoid air cells are not opacified. Vascular calcifications. Calcification and deformity of the right globe consistent with old insult. Focal increased density in the left globe likely postoperative. IMPRESSION: No acute intracranial abnormalities. Chronic atrophy and small vessel ischemic changes. Old right frontal infarct. These results were called by telephone at the time of interpretation on 08/19/2015  at 8:38 pm to Dr. Leonel Ramsay, who verbally acknowledged these results. Electronically Signed   By: Lucienne Capers M.D.   On: 08/19/2015 20:41   [name]  08/19/2015  CLINICAL DATA:  Initial valuation for acute altered mental status. EXAM: MRI HEAD WITHOUT CONTRAST TECHNIQUE: Multiplanar, multiecho pulse sequences of the brain and surrounding structures were obtained without intravenous contrast. COMPARISON:  Prior head CT from earlier the same day P FINDINGS: Diffusion-weighted sequences demonstrate a possible tiny 3 mm cortical infarct within the left temporal lobe (series 3, image 20). No associated edema or hemorrhage. No other infarct. Largely absent flow void within the diminutive right vertebral artery, stable. Major intracranial vascular flow voids are otherwise maintained. Diffuse prominence of the CSF containing spaces is compatible with generalized age-related cerebral atrophy. Mild chronic small vessel ischemic disease in within the periventricular white matter. Encephalomalacia within the high right frontal lobe consistent with remote ischemic infarct. There is associated gliosis within this region. Multiple additional remote lacunar infarcts within the right basal ganglia involving both the caudate and lentiform nuclei. No mass lesion, midline shift, or mass effect. No hydrocephalus. No extra-axial fluid collection. Pituitary gland normal. No acute abnormality about the globes. 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 Mild mucosal thickening within the ethmoidal air cells and maxillary sinuses. No mastoid effusion. Inner ear structures normal. Bone marrow signal intensity within normal limits. No scalp soft tissue abnormality. IMPRESSION: 1. 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 2. No other acute intracranial process identified. 3. Moderate to large remote infarct within the  posterior right frontal lobe with extension into the operculum. Additional remote lacunar infarcts involving the right basal ganglia. 4. Nonvisualization of the right vertebral artery, which may be occluded. This is stable relative to previous MRI from 2014. 5. Atrophy with mild chronic small vessel ischemic disease. Electronically Signed   By: Jeannine Boga M.D.   On: 08/19/2015 23:14    Microbiology: Recent Results (from the past 240 hour(s))  Blood culture (routine x 2)     Status: None (Preliminary result)   Collection Time: 08/19/15  9:45 PM  Result Value Ref Range Status   Specimen Description BLOOD RIGHT FOREARM  Final   Special Requests IN PEDIATRIC BOTTLE 1CC  Final   Culture NO GROWTH 3 DAYS  Final   Report Status PENDING  Incomplete  Blood culture (routine x 2)     Status: None (Preliminary result)   Collection Time: 08/19/15  9:51 PM  Result Value Ref Range Status   Specimen Description BLOOD RIGHT HAND  Final   Special Requests BOTTLES DRAWN AEROBIC AND ANAEROBIC 5CC  Final   Culture NO GROWTH 3 DAYS  Final   Report Status PENDING  Incomplete     Labs: Basic Metabolic Panel:  Recent Labs Lab 08/19/15 2035 08/19/15 2044 08/21/15 1337 08/22/15 0730 08/23/15 0211  NA 133* 127* 134* 134* 136  K 4.0 3.8 3.6 3.8 3.8  CL 98* 98* 102 104 105  CO2  --  21* 27 23 24   GLUCOSE 119* 119* 105* 107* 104*  BUN 15 12 14 13 13   CREATININE 1.30* 1.26* 1.38* 1.07 1.16  CALCIUM  --  8.3* 8.4* 8.6* 8.5*   Liver Function Tests:  Recent Labs Lab 08/19/15 2044  AST 25  ALT 17  ALKPHOS 46  BILITOT 0.7  PROT 7.0  ALBUMIN 3.4*   No results for input(s): LIPASE, AMYLASE in the last 168 hours. No results for input(s): AMMONIA in the last 168 hours. CBC:  Recent Labs Lab 08/19/15 2035 08/19/15 2044 08/21/15 1337  WBC  --  7.0 7.5  NEUTROABS  --  4.3  --   HGB 13.3 12.5* 12.3*  HCT 39.0 36.2* 36.0*  MCV  --  92.8 93.0  PLT  --  126* 139*   Cardiac Enzymes: No  results for input(s): CKTOTAL, CKMB, CKMBINDEX, TROPONINI in the last 168 hours. BNP: BNP (last 3 results) No results for input(s): BNP in the last 8760 hours.  ProBNP (last 3 results) No results for input(s): PROBNP in the last 8760 hours.  CBG:  Recent Labs Lab 08/19/15 2046  GLUCAP 118*       Signed:  Charlynne Cousins  [facility] 08/23/2015, 12:25 PM

## 2015-08-21 NOTE — Progress Notes (Signed)
[facility] PROGRESS NOTE    Progress Note   NARVEL KATA [id number] DOB: October 18, 1940 DOA: 08/19/2015 PCP: Nyoka Cowden, MD   Brief Narrative:   Brett Castaneda is an 74 y.o. male Past medical history of a stroke, essential hypertension and rheumatoid arthritis along with ALS who presents to the ED complaining of increased confusion that was noticed by his wife on the day of admission.  Assessment/Plan:   CVA (cerebral infarction): HgbA1c pending, fasting lipid panel HDL < 40 LDL < 98. Cont statins MR of the brain without contrast 3 mm acute ischemic cortical infarct within the left temporal lobe. PT eval pending Echocardiogram EF 55% no source of thrombus and Carotid dopplers no ICA stenosis B/L. Prophylactic therapy-Antiplatelet med: on plavix at home home will need ASA and plavix will d/w with neuro EEG mild generalized nonspecific reticular slowing of cerebral activity  Hyponatremia: Fractional excretion of sodium greater than 1 Hold IV fluid basic metabolic panels pending.  Dyslipidemia Start stains  Essential hypertension Allow permissive HTN.  Rheumatoid arthritis ([place]): Continue methotrexate and folic acid.  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  Temp 100.3 With no further elevation in temp and no leukocytosis, no cough, SOB or dysuria. Influenza PCR negative.    DVT Prophylaxis - Lovenox ordered.  Family Communication: none Disposition Plan: Home in am Code Status:     Code Status Orders        Start     Ordered   08/20/15 0142  Full code   Continuous     08/20/15 0141        IV Access:    Peripheral IV   Procedures and diagnostic studies:   Dg Chest 2 View  08/19/2015  CLINICAL DATA:  Stroke symptoms tonight. Fever. Hypertension. Ex-smoker. EXAM: CHEST  2 VIEW COMPARISON:  10/28/2013 FINDINGS: Mild cardiac enlargement without  vascular congestion. Interstitial changes in the lung bases similar to previous study, likely fibrosis. No focal airspace disease. No blunting of costophrenic angles. No pneumothorax. Calcified and tortuous aorta. Surgical clips in the right upper quadrant. 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 Mild cardiac enlargement. No evidence of active pulmonary disease. Electronically Signed   By: Lucienne Capers M.D.   On: 08/19/2015 23:02   Ct Head Wo Contrast  08/19/2015  CLINICAL DATA:  Altered mental status.  No weakness.  Code stroke. EXAM: CT HEAD WITHOUT CONTRAST TECHNIQUE: Contiguous axial images were obtained from the base of the skull through the vertex without intravenous contrast. COMPARISON:  07/16/2013 FINDINGS: Diffuse cerebral atrophy. No ventricular dilatation. Low-attenuation changes in the deep white matter consistent with small vessel ischemia. Focal encephalomalacia in the right frontal lobe consistent with old infarct. No mass effect or midline shift. No abnormal extra-axial fluid collections. Gray-white matter junctions are distinct. Basal cisterns are not effaced. No evidence of acute intracranial hemorrhage. No depressed skull fractures. Mild mucosal thickening in the paranasal sinuses. Mastoid air cells are not opacified. Vascular calcifications. Calcification and deformity of the right globe consistent with old insult. Focal increased density in the left globe likely postoperative. IMPRESSION: No acute intracranial abnormalities. Chronic atrophy and small vessel ischemic changes. Old right frontal infarct. These results were called by telephone at the time of interpretation on 08/19/2015 at 8:38 pm to Dr. Leonel Ramsay, who verbally acknowledged these results. Electronically Signed   By: Lucienne Capers M.D.   On: 08/19/2015 20:41   Mr Brain Lottie Dawson  Contrast  08/19/2015  CLINICAL DATA:  Initial valuation for acute altered mental status. EXAM: MRI HEAD WITHOUT CONTRAST TECHNIQUE: Multiplanar,  multiecho pulse sequences of the brain and surrounding structures were obtained without intravenous contrast. COMPARISON:  Prior head CT from earlier the same day P FINDINGS: Diffusion-weighted sequences demonstrate a possible tiny 3 mm cortical infarct within the left temporal lobe (series 3, image 20). No associated edema or hemorrhage. No other infarct. Largely absent flow void within the diminutive right vertebral artery, stable. Major intracranial vascular flow voids are otherwise maintained. Diffuse prominence of the CSF containing spaces is compatible with generalized age-related cerebral atrophy. Mild chronic small vessel ischemic disease in within the periventricular white matter. Encephalomalacia within the high right frontal lobe consistent with remote ischemic infarct. There is associated gliosis within this region. Multiple additional remote lacunar infarcts within the right basal ganglia involving both the caudate and lentiform nuclei. No mass lesion, midline shift, or mass effect. No hydrocephalus. No extra-axial fluid collection. Pituitary gland normal. No acute abnormality about the globes. 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 Mild mucosal thickening within the ethmoidal air cells and maxillary sinuses. No mastoid effusion. Inner ear structures normal. Bone marrow signal intensity within normal limits. No scalp soft tissue abnormality. IMPRESSION: 1. 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 2. No other acute intracranial process identified. 3. Moderate to large remote infarct within the posterior right frontal lobe with extension into the operculum. Additional remote lacunar infarcts involving the right basal ganglia. 4. Nonvisualization of the right vertebral artery, which may be occluded. This is stable relative to previous MRI from 2014. 5. Atrophy with mild chronic small vessel ischemic  disease. Electronically Signed   By: Jeannine Boga M.D.   On: 08/19/2015 23:14     Medical Consultants:    None.  Anti-Infectives:   Anti-infectives    None      Subjective:    DEZION JEANLOUIS no complains  Objective:    Filed Vitals:   08/20/15 2132 08/21/15 0153 08/21/15 0540 08/21/15 0906  BP: 136/80 151/98 149/86 143/90  Pulse: 83 84 76 80  Temp: 99.1 F (37.3 C) 98.7 F (37.1 C) 98.2 F (36.8 C) 97.6 F (36.4 C)  TempSrc: Oral Oral Oral Oral  Resp: 18 18 18 18   Height:      Weight:      SpO2: 99% 98% 98% 98%    Intake/Output Summary (Last 24 hours) at 08/21/15 1226 Last data filed at 08/21/15 0602  Gross per 24 hour  Intake      0 ml  Output    300 ml  Net   -300 ml   Filed Weights   08/20/15 0100  Weight: 77.928 kg (171 lb 12.8 oz)    Exam: Gen:  NAD Cardiovascular:  RRR, No M/R/G Chest and lungs:   CTAB Abdomen:  Abdomen soft, NT/ND, + BS  Extremities:  No C/E/C   Data Reviewed:    Labs: Basic Metabolic Panel:  Recent Labs Lab 08/19/15 2035 08/19/15 2044  NA 133* 127*  K 4.0 3.8  CL 98* 98*  CO2  --  21*  GLUCOSE 119* 119*  BUN 15 12  CREATININE 1.30* 1.26*  CALCIUM  --  8.3*   GFR Estimated Creatinine Clearance: 56.4 mL/min (by C-G formula based on Cr of 1.26). Liver Function Tests:  Recent Labs Lab 08/19/15 2044  AST 25  ALT  17  ALKPHOS 46  BILITOT 0.7  PROT 7.0  ALBUMIN 3.4*   No results for input(s): LIPASE, AMYLASE in the last 168 hours. No results for input(s): AMMONIA in the last 168 hours. Coagulation profile  Recent Labs Lab 08/19/15 2044  INR 1.13    CBC:  Recent Labs Lab 08/19/15 2035 08/19/15 2044  WBC  --  7.0  NEUTROABS  --  4.3  HGB 13.3 12.5*  HCT 39.0 36.2*  MCV  --  92.8  PLT  --  126*   Cardiac Enzymes: No results for input(s): CKTOTAL, CKMB, CKMBINDEX, TROPONINI in the last 168 hours. BNP (last 3 results) No results for input(s): PROBNP in the last 8760  hours. CBG:  Recent Labs Lab 08/19/15 2046  GLUCAP 118*   D-Dimer: No results for input(s): DDIMER in the last 72 hours. Hgb A1c: No results for input(s): HGBA1C in the last 72 hours. Lipid Profile:  Recent Labs  08/20/15 0323  CHOL 137  HDL 25*  LDLCALC 98  TRIG 72  CHOLHDL 5.5   Thyroid function studies: No results for input(s): TSH, T4TOTAL, T3FREE, THYROIDAB in the last 72 hours.  Invalid input(s): FREET3 Anemia work up: No results for input(s): VITAMINB12, FOLATE, FERRITIN, TIBC, IRON, RETICCTPCT in the last 72 hours. Sepsis Labs:  Recent Labs Lab 08/19/15 2044  WBC 7.0   Microbiology Recent Results (from the past 240 hour(s))  Blood culture (routine x 2)     Status: None (Preliminary result)   Collection Time: 08/19/15  9:45 PM  Result Value Ref Range Status   Specimen Description BLOOD RIGHT FOREARM  Final   Special Requests IN PEDIATRIC BOTTLE [place]  Final   Culture NO GROWTH 2 DAYS  Final   Report Status PENDING  Incomplete  Blood culture (routine x 2)     Status: None (Preliminary result)   Collection Time: 08/19/15  9:51 PM  Result Value Ref Range Status   Specimen Description BLOOD RIGHT HAND  Final   Special Requests BOTTLES DRAWN AEROBIC AND ANAEROBIC 5CC  Final   Culture NO GROWTH 2 DAYS  Final   Report Status PENDING  Incomplete     Medications:   . acidophilus  1 capsule Oral Daily  . aspirin EC  81 mg Oral Daily  . atorvastatin  40 mg Oral q1800  . beta carotene w/minerals  1 tablet Oral Daily  . clopidogrel  75 mg Oral Daily  . enoxaparin (LOVENOX) injection  40 mg Subcutaneous Daily  . feeding supplement (ENSURE ENLIVE)  237 mL Oral BID BM  . folic acid-pyridoxine-cyancobalamin  1 tablet Oral Q Sat  . latanoprost  1 drop Left Eye QHS  . methotrexate  10 mg Oral Q Sat  . tamsulosin  0.4 mg Oral Daily  . timolol  1 drop Left Eye QPM   Continuous Infusions: . sodium chloride      Time spent: 25 min   LOS: 1 day   Charlynne Cousins  [facility] Pager [phone]  *Please refer to [email or web address], password [email or web address] to get updated schedule on who will round on this patient, as hospitalists switch teams weekly. If 7PM-7AM, please contact night-coverage at [email or web address], password [email or web address] for any overnight needs.  08/21/2015, 12:26 PM

## 2015-08-21 NOTE — Evaluation (Signed)
Occupational Therapy Evaluation Patient Details Name: Brett Castaneda MRN: [id number] DOB: Nov 22, 1940 Today's Date: 08/21/2015    History of Present Illness Pt is a 74 y/o M admitted w/ increased confusion and difficulty w/ word finding.  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  Pt's PMH includes stroke, rheumatoid arthritis, ALS blind Rt eye.   Clinical Impression   .Pt admitted with above.  He presents to OT with generalized weakness, impaired vision and impaired cognition.  He requires min guard assist with ADLs.  Wife reports she feels he is close to his baseline status.  Recommend HHOT with 24 hour supervision at discharge.  All further OT needs can be met by [facility] as assessment in his home environment will ensure he is truly back to baseline.      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  pt reports 1 fall in the past year 2/2 walking into a small ditch Restrictions Weight Bearing Restrictions: No      Mobility Bed Mobility Overal bed mobility: Needs Assistance Bed Mobility: Supine to Sit;Sit to Supine     Supine to sit: Supervision Sit to supine: Supervision   General bed mobility comments: Supervision for safety, increased time  Transfers Overall transfer level: Needs assistance Equipment used: Rolling walker (2 wheeled) Transfers: Sit to/from [employer] Sit to Stand: Min guard Stand pivot transfers: Min guard       General transfer comment: Cues for hand placement and technique, pt is quick to rise.  Cues for proper use of RW when sitting in recliner as he turns and sits.    Balance Overall balance assessment: Needs assistance Sitting-balance support: Feet supported Sitting  balance-Leahy Scale: Good     Standing balance support: During functional activity Standing balance-Leahy Scale: Fair Standing balance comment: Relies on RW and close min guard for support                            ADL Overall ADL's : Needs assistance/impaired Eating/Feeding: Set up;Sitting   Grooming: Wash/dry hands;Wash/dry face;Oral care;Min guard;Standing Grooming Details (indicate cue type and reason): min verbal cues to locate items  Upper Body Bathing: Set up;Sitting   Lower Body Bathing: Min guard;Sit to/from stand   Upper Body Dressing : Set up;Sitting   Lower Body Dressing: Min guard;Sit to/from stand   Toilet Transfer: Min guard;Ambulation;Comfort height toilet;RW   Toileting- Water quality scientist and Hygiene: Min guard;Sit to/from stand       Functional mobility during ADLs: Min guard;Rolling walker General ADL Comments: Pt runs into items with RW in room.  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  He reports at baseline, when he looks down, his vision decreases.  Pt demonstrates difficulty following directions for visual field assessment - misses targets in Lt visual field.  requires min cues to locate items on sink during grooming    Perception Perception Perception Tested?: Yes   Praxis Praxis Praxis tested?: Within functional limits    Pertinent Vitals/Pain Pain Assessment: No/denies pain     Hand Dominance Right   Extremity/Trunk  Assessment Upper Extremity Assessment Upper Extremity Assessment: LUE deficits/detail;RUE deficits/detail RUE Deficits / Details: Pt with intrinic wasting bil. hands.  Generalized weakness  LUE Deficits / Details: Strength grossly 4/5 from previous CVA.  Intrinsic wasting hnad    Lower Extremity Assessment Lower Extremity Assessment: Defer to PT evaluation RLE Deficits /  Details: Toe extension 0/5, DF 2/5 RLE Sensation: decreased light touch LLE Deficits / Details: Toe extension 0/5, DF 2/5 LLE Sensation: decreased light touch   Cervical / Trunk Assessment Cervical / Trunk Assessment: Kyphotic   Communication Communication Communication: No difficulties   Cognition Arousal/Alertness: Awake/alert Behavior During Therapy:  (easily frustrated ) Overall Cognitive Status: History of cognitive impairments - at baseline                     General Comments       Exercises       Shoulder Instructions      Home Living Family/patient expects to be discharged to:: Private residence Living Arrangements: Spouse/significant other (daughter lives next door) Available Help at Discharge: Family;Available 24 hours/day Type of Home: House Home Access: Stairs to enter [employer] of Steps: 1 Entrance Stairs-Rails: None Home Layout: One level     Bathroom Shower/Tub: Occupational psychologist: Standard     Home Equipment: Environmental consultant - 2 wheels;Cane - single point;Walker - 4 wheels (rollator)          Prior Functioning/Environment Level of Independence: Independent with assistive device(s)        Comments: Pt does not drive, but does do yard work at times [employer] at home w/o AD.  Uses rollator when out in community    OT Diagnosis: Generalized weakness;Cognitive deficits;Disturbance of vision   OT Problem List: Decreased strength;Decreased activity tolerance;Impaired balance (sitting and/or standing);Impaired vision/perception;Decreased coordination;Decreased cognition;Decreased safety awareness;Decreased knowledge of use of DME or AE   OT Treatment/Interventions:      OT Goals(Current goals can be found in the care plan section) Acute Rehab OT Goals Patient Stated Goal: to go home  OT Goal Formulation: All assessment and education complete, DC therapy  OT Frequency:     Barriers to D/C:             Co-evaluation              End of Session Equipment Utilized During Treatment: Rolling walker Nurse Communication: Mobility status  Activity Tolerance: Patient tolerated treatment well Patient left: in bed;with call bell/phone within reach;with bed alarm set   Time: 6681-5947 OT Time Calculation (min): 31 min Charges:  OT General Charges $OT Visit: 1 Procedure OT Evaluation $Initial OT Evaluation Tier I: 1 Procedure OT Treatments $Self Care/Home Management : 8-22 mins G-Codes:    Rollen Selders M Sep 01, 2015, 3:53 PM

## 2015-08-21 NOTE — Evaluation (Signed)
Physical Therapy Evaluation Patient Details Name: Brett Castaneda MRN: [id number] DOB: 28-Jun-1941 Today's Date: 08/21/2015   History of Present Illness  Pt is a 74 y/o M admitted w/ increased confusion and difficulty w/ word finding.  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  Pt's PMH includes stroke, rheumatoid arthritis, ALS blind Rt eye.  Clinical Impression  Pt admitted with above diagnosis. Pt currently with functional limitations due to the deficits listed below (see PT Problem List). [name] was Ind w/o AD in his home PTA but currently requires close min guard assist and RW for safe ambulation 2/2 instability.  He will have 24/7 assist from his wife upon return home. Pt will benefit from skilled PT to increase their independence and safety with mobility to allow discharge to the venue listed below.      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  pt reports 1 fall in the past year 2/2 walking into a small ditch Restrictions Weight Bearing Restrictions: No      Mobility  Bed Mobility Overal bed mobility: Needs Assistance Bed Mobility: Supine to Sit     Supine to sit: Supervision     General bed mobility comments: Supervision for safety, increased time  Transfers Overall transfer level: Needs assistance Equipment used: Rolling walker (2 wheeled) Transfers: Sit to/from Stand Sit to Stand: Min assist         General transfer comment: Cues for hand placement and technique, pt is quick to rise.  Cues for proper use of RW when sitting in recliner as he turns and sits.  Ambulation/Gait Ambulation/Gait assistance: Min guard Ambulation Distance (Feet): 100 Feet Assistive device: Rolling  walker (2 wheeled) Gait Pattern/deviations: Step-through pattern;Antalgic;Decreased dorsiflexion - left;Decreased dorsiflexion - right;Decreased stride length;Trunk flexed   Gait velocity interpretation: Below normal speed for age/gender General Gait Details: Close min guard assist as pt demonstrates instability in turning.  Short stride length.  Pt's daughter reports he look at his baseline or even better.    Stairs            Wheelchair Mobility    Modified Rankin (Stroke Patients Only) Modified Rankin (Stroke Patients Only) Pre-Morbid Rankin Score: Moderate disability Modified Rankin: Moderately severe disability     Balance Overall balance assessment: Needs assistance Sitting-balance support: Feet supported Sitting balance-Leahy Scale: Good     Standing balance support: Bilateral upper extremity supported;During functional activity Standing balance-Leahy Scale: Fair Standing balance comment: Relies on RW and close min guard for support                             Pertinent Vitals/Pain Pain Assessment: No/denies pain    Home Living Family/patient expects to be discharged to:: Private residence Living Arrangements: Spouse/significant other (daughter lives next door) Available Help at Discharge: Family;Available 24 hours/day Type of Home: House Home Access: Stairs to enter Entrance Stairs-Rails: None Entrance Stairs-Number of Steps: 1 Home Layout: One level Home Equipment: Walker - 2 wheels;Cane - single point;Walker - 4 wheels (rollator)      Prior Function Level of Independence: Independent with assistive device(s)         Comments: 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  Uses rollator  when out in community     Hand Dominance        Extremity/Trunk Assessment   Upper Extremity Assessment: Defer to OT evaluation           Lower Extremity Assessment: RLE deficits/detail;LLE deficits/detail RLE Deficits / Details: Toe extension 0/5, DF 2/5 LLE  Deficits / Details: Toe extension 0/5, DF 2/5  Cervical / Trunk Assessment: Kyphotic  Communication   Communication: No difficulties  Cognition Arousal/Alertness: Awake/alert Behavior During Therapy: WFL for tasks assessed/performed Overall Cognitive Status: History of cognitive impairments - at baseline                      General Comments      Exercises        Assessment/Plan    PT Assessment Patient needs continued PT services  PT Diagnosis Difficulty walking   PT Problem List Decreased strength;Decreased range of motion;Decreased activity tolerance;Decreased balance;Decreased mobility;Decreased knowledge of use of DME;Decreased safety awareness;Decreased knowledge of precautions;Decreased cognition;Impaired sensation  PT Treatment Interventions DME instruction;Gait training;Stair training;Functional mobility training;Therapeutic activities;Therapeutic exercise;Balance training;Neuromuscular re-education;Cognitive remediation;Patient/family education   PT Goals (Current goals can be found in the Care Plan section) Acute Rehab PT Goals Patient Stated Goal: to go home once appropriate PT Goal Formulation: With patient/family Time For Goal Achievement: 09/04/15 Potential to Achieve Goals: Good    Frequency Min 3X/week   Barriers to discharge Inaccessible home environment 1 step to enter home    Co-evaluation               End of Session Equipment Utilized During Treatment: Gait belt Activity Tolerance: Patient tolerated treatment well Patient left: in chair;with call bell/phone within reach;with chair alarm set Nurse Communication: Mobility status;Precautions         Time: [id number] PT Time Calculation (min) (ACUTE ONLY): 22 min   Charges:   PT Evaluation $Initial PT Evaluation Tier I: 1 Procedure     PT G CodesJoslyn Hy PT, DPT [id number] Pager: [phone] 08/21/2015, 1:54 PM

## 2015-08-22 DIAGNOSIS — N179 Acute kidney failure, unspecified: Secondary | ICD-10-CM

## 2015-08-22 LAB — BASIC METABOLIC PANEL
ANION GAP: 7 (ref 5–15)
BUN: 13 mg/dL (ref 6–20)
CHLORIDE: 104 mmol/L (ref 101–111)
CO2: 23 mmol/L (ref 22–32)
Calcium: 8.6 mg/dL — ABNORMAL LOW (ref 8.9–10.3)
Creatinine, Ser: 1.07 mg/dL (ref 0.61–1.24)
GFR calc Af Amer: >60 mL/min (ref >60)
GFR calc non Af Amer: >60 mL/min (ref >60)
GLUCOSE: 107 mg/dL — AB (ref 65–99)
POTASSIUM: 3.8 mmol/L (ref 3.5–5.1)
Sodium: 134 mmol/L — ABNORMAL LOW (ref 135–145)

## 2015-08-22 LAB — RETICULOCYTES
RBC.: 3.85 MIL/uL — ABNORMAL LOW (ref 4.22–5.81)
RETIC CT PCT: 0.7 % (ref 0.4–3.1)
Retic Count, Absolute: 27 10*3/uL (ref 19.0–186.0)

## 2015-08-22 LAB — HEMOGLOBIN A1C
HEMOGLOBIN A1C: 5.6 % (ref 4.8–5.6)
Hgb A1c MFr Bld: 5.5 % (ref 4.8–5.6)
MEAN PLASMA GLUCOSE: 114 mg/dL
Mean Plasma Glucose: 111 mg/dL

## 2015-08-22 LAB — IRON AND TIBC
IRON: 69 ug/dL (ref 45–182)
SATURATION RATIOS: 25 % (ref 17.9–39.5)
TIBC: 273 ug/dL (ref 250–450)
UIBC: 204 ug/dL

## 2015-08-22 LAB — FERRITIN: Ferritin: 522 ng/mL — ABNORMAL HIGH (ref 24–336)

## 2015-08-22 LAB — FOLATE: Folate: 11.2 ng/mL (ref >5.9)

## 2015-08-22 LAB — VITAMIN B12: Vitamin B-12: 678 pg/mL (ref 180–914)

## 2015-08-22 MED ORDER — FAMOTIDINE 20 MG PO TABS
20.0000 mg | ORAL_TABLET | Freq: Every day | ORAL | Status: DC
  Administered 2015-08-22 – 2015-08-23 (×2): 20 mg via ORAL
  Filled 2015-08-22 (×2): qty 1

## 2015-08-22 NOTE — Progress Notes (Signed)
Physical Therapy Treatment Patient Details Name: Brett Castaneda MRN: [id number] DOB: 1941-02-09 Today's Date: 08/22/2015    History of Present Illness Pt is a 74 y/o M admitted w/ increased confusion and difficulty w/ word finding.  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  Pt's PMH includes stroke, rheumatoid arthritis, ALS blind Rt eye.    PT Comments    Pt progressing towards physical therapy goals. Was able to perform transfers and ambulation with increased independence this session, and was motivated to attempt ambulation without the RW. Pt's wife reports that he appears improved past his baseline of function. Pt's wife pushed the chair behind the pt for safety but a seated rest break was not needed. Will continue to follow and progress as able per POC.   Follow Up Recommendations  Home health PT;Supervision for mobility/OOB (Higher level balance training)     Equipment Recommendations  None recommended by PT    Recommendations for Other Services       Precautions / Restrictions Precautions Precautions: Fall Precaution Comments: Blind in Rt eye.  pt reports 1 fall in the past year 2/2 walking into a small ditch Restrictions Weight Bearing Restrictions: No    Mobility  Bed Mobility Overal bed mobility: Needs Assistance Bed Mobility: Supine to Sit     Supine to sit: Supervision     General bed mobility comments: No assist required. Supervision for safety.   Transfers Overall transfer level: Needs assistance Equipment used: Rolling walker (2 wheeled) Transfers: Sit to/from Stand Sit to Stand: Supervision         General transfer comment: Pt was able to stand at EOB without assist and without UE support. Pt was cued to wait for therapist but decided to stand on his own anyway. No LOB noted. Gait belt applied in standing.   Ambulation/Gait Ambulation/Gait assistance: Min guard;Min assist Ambulation Distance (Feet): 500  Feet Assistive device: Rolling walker (2 wheeled) Gait Pattern/deviations: Step-through pattern;Decreased stride length;Shuffle;Trunk flexed Gait velocity: Decreased Gait velocity interpretation: Below normal speed for age/gender General Gait Details: Pt initially used the RW for support, however without the RW, he was able to walk at reported baseline. Wife states pt appears "better" than baseline. Without walker, pt with shuffling, steppage gait. Occasional min assist for balance initially but grossly at a min guard level.    Stairs            Wheelchair Mobility    Modified Rankin (Stroke Patients Only) Modified Rankin (Stroke Patients Only) Pre-Morbid Rankin Score: Moderate disability Modified Rankin: Moderately severe disability     Balance Overall balance assessment: Needs assistance Sitting-balance support: Feet supported;No upper extremity supported Sitting balance-Leahy Scale: Good     Standing balance support: No upper extremity supported;During functional activity Standing balance-Leahy Scale: Fair Standing balance comment: Occasional assist required but grossly at min guard for dynamic activity.                     Cognition Arousal/Alertness: Awake/alert Behavior During Therapy: WFL for tasks assessed/performed Overall Cognitive Status: History of cognitive impairments - at baseline                      Exercises      General Comments        Pertinent Vitals/Pain Pain Assessment: No/denies pain    Home Living     Available Help at Discharge: Family;Available 24 hours/day Type of Home: House  Prior Function            PT Goals (current goals can now be found in the care plan section) Acute Rehab PT Goals Patient Stated Goal: to go home  PT Goal Formulation: With patient/family Time For Goal Achievement: 09/04/15 Potential to Achieve Goals: Good Progress towards PT goals: Progressing toward goals     Frequency  Min 3X/week    PT Plan Current plan remains appropriate    Co-evaluation             End of Session Equipment Utilized During Treatment: Gait belt Activity Tolerance: Patient tolerated treatment well Patient left: in chair;with call bell/phone within reach;with chair alarm set     Time: [id number] PT Time Calculation (min) (ACUTE ONLY): 24 min  Charges:  $Gait Training: 23-37 mins                    G Codes:      Rolinda Roan 2015/08/24, 11:30 AM   Rolinda Roan, PT, DPT Acute Rehabilitation Services Pager: [phone]

## 2015-08-22 NOTE — Telephone Encounter (Signed)
Lm that cologuard results were negative and that per Dr. Henrene Pastor, no further screening recommended.

## 2015-08-22 NOTE — Progress Notes (Signed)
    [facility] has been requested to perform a transesophageal echocardiogram on 08/22/2015 for stroke.  After careful review of history and examination, the risks and benefits of transesophageal echocardiogram have been explained including risks of esophageal damage, perforation (1:10,000 risk), bleeding, pharyngeal hematoma as well as other potential complications associated with conscious sedation including aspiration, arrhythmia, respiratory failure and death. Alternatives to treatment were discussed, questions were answered. Patient is willing to proceed.   74 yo male came in with recurrence stroke, previous stroke in 2008 during carotid procedure. MRI showed tiney 36mm acute ischemic cortical infarct in L tempral lobe. Vital stable. Hgb 12.3, platelet 139. No ovbious contraindication. Plan for TEE tomorrow.   Almyra Deforest, PA-C 08/22/2015 3:20 PM

## 2015-08-22 NOTE — Progress Notes (Signed)
STROKE TEAM PROGRESS NOTE   HISTORY Brett Castaneda is a 74 y.o. male with a history of previous stroke who presents with increasing confusion today. His wife states that he was in his normal state of health around 4:30 PM, and then subsequently began becoming progressively more confused. Finally when she tried to get him up, she noticed that he was "unable to move" anywhere. He was very tremulous, but not in any particular area.  Of note, he has not been eating or drinking well for the past few days and has been feeling "crummy" and has had the sensation of urinary urgency 5 times within the past couple of hours. He was also just on Keflex for left arm cellulitis which has appeared to resolve.   LKW: 4:30 PM tpa given?: no, mild symptoms   SUBJECTIVE (INTERVAL HISTORY) Family at bedside. Mental status changes resolved. Patient had history of right hemispheric watershed infarct from symptomatic proximal right ICA stenosis in 2008 for which he underwent carotid revascularization by Dr. Amedeo Plenty. He had mild residual left hemiparesis but had done well. Review of electronic medical records show that patient has been diagnosed with a microfollicular lateral sclerosis by Dr. Narda Amber and got a confirmatory second opinion from [place]  at [place]:  [98 F (36.7 C)-99 F (37.2 C)] 98.7 F (37.1 C) (11/28 0945) Pulse Rate:  [74-86] 74 (11/28 0945) Cardiac Rhythm:  [-] Normal sinus rhythm (11/28 0700) Resp:  [18] 18 (11/28 0945) BP: (137-172)/(75-91) 158/91 mmHg (11/28 0945) SpO2:  [92 %-99 %] 92 % (11/28 0945)  CBC:   Recent Labs Lab 08/19/15 2044 08/21/15 1337  WBC 7.0 7.5  NEUTROABS 4.3  --   HGB 12.5* 12.3*  HCT 36.2* 36.0*  MCV 92.8 93.0  PLT 126* 139*    Basic Metabolic Panel:   Recent Labs Lab 08/21/15 1337 08/22/15 0730  NA 134* 134*  K 3.6 3.8  CL 102 104  CO2 27 23  GLUCOSE 105* 107*  BUN 14 13  CREATININE 1.38* 1.07  CALCIUM 8.4* 8.6*     Lipid Panel:     Component Value Date/Time   CHOL 137 08/20/2015 0323   TRIG 72 08/20/2015 0323   HDL 25* 08/20/2015 0323   CHOLHDL 5.5 08/20/2015 0323   VLDL 14 08/20/2015 0323   LDLCALC 98 08/20/2015 0323   HgbA1c:  Lab Results  Component Value Date   HGBA1C 5.5 08/21/2015   Urine Drug Screen:     Component Value Date/Time   LABOPIA NONE DETECTED 08/19/2015 2052   COCAINSCRNUR NONE DETECTED 08/19/2015 2052   LABBENZ NONE DETECTED 08/19/2015 2052   AMPHETMU NONE DETECTED 08/19/2015 2052   THCU NONE DETECTED 08/19/2015 2052   LABBARB NONE DETECTED 08/19/2015 2052      IMAGING  Dg Chest 2 View 08/19/2015   Fibrosis in the lung bases. Mild cardiac enlargement. No evidence of active pulmonary disease.    Ct Head Wo Contrast 08/19/2015   No acute intracranial abnormalities. Chronic atrophy and small vessel ischemic changes. Old right frontal infarct.     [name] 08/19/2015   1. 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  2. No other acute intracranial process identified.  3. Moderate to large remote infarct within the posterior right frontal lobe with extension into the operculum. Additional remote lacunar infarcts involving the right basal ganglia.  4. Nonvisualization of the right vertebral artery, which may be occluded.  This is stable relative to previous MRI from 2014.  5. Atrophy with mild chronic small vessel ischemic disease.     PHYSICAL EXAM NEUROLOGIC:   Physical exam: Exam: Gen: NAD, conversant                CV: RRR, no MRG. No Carotid Bruits. No peripheral edema, warm, nontender Eyes: Conjunctivae clear without exudates or hemorrhage  Neuro: Detailed Neurologic Exam  Speech:    Speech is normal; fluent and spontaneous with normal comprehension.  Cognition:    The patient is oriented to person, place, year, location, not month (says it is September)   Cranial Nerves:  blind in the  right eye with impaired extraocular movements from chronic condition. Left pupil is reactive with intact vual fields. Extraocular movements are intact in the left eye. Trigeminal sensation is intact and the muscles of mastication are normal. Mild facial weakness on smile with right NL flattening. The palate elevates in the midline. Hearing intact to voice. Voice is normal. Shoulder shrug is normal. The tongue has normal motion without fasciculations.    Motor Observation: no involuntary movements noted. Distal atrophy in the extremities Tone:    Normal muscle tone.     Strength:    Strength is largely intact in the UE proximally (some chronic mild left arm weakness) with symmetric distal weakness most pronounced in the intrinsic hand muscles. Can lift both legs simultaneously off of the bed and hold there easily. Some distal weakness in the lower extremities most pronounced in the DF 3/5. These are chronic.     Sensation: intact to LT     Reflex Exam:  DTR's:    Absent AJs otherwise deep tendon reflexes in the upper and lower extremities are brisk bilaterally.   Toes:    The toes are equivocal bilaterally.  Question left babinski.     ASSESSMENT/PLAN Brett Castaneda is a 74 y.o. male with history of a previous stroke with residual mild left hemiparesis, hypertension, hyperlipidemia, status post right carotid endarterectomy, and ALS presenting with altered mental status. He did not receive IV t-PA due to mild deficits.  Stroke:  Dominant infarct possibly embolic from an unknown source.  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   Consider TEE and loop recorder placement. Marland Kitchen   MRA  not performed. Consider CTA of the head.  Carotid Doppler - Preliminary report: 1-39% ICA stenosis. Right CEA is patent. Vertebral artery flow is antegrade.   2D Echo - EF 60-65%. No cardiac source of emboli  identified.  LDL - 98  HgbA1c pending  VTE prophylaxis - Lovenox Diet Heart Room service appropriate?: Yes; Fluid consistency:: Thin  clopidogrel 75 mg daily prior to admission, now on aspirin 81 mg daily and clopidogrel 75 mg daily   Patient counseled to be compliant with his antithrombotic medications  Ongoing aggressive stroke risk factor management  Therapy recommendations: Home health physical therapy recommended.  Disposition: Pending  Hypertension  Stable Permissive hypertension (OK if < 220/120) but gradually normalize in 5-7 days  Hyperlipidemia  Home meds:  No lipid-lowering medications prior to admission  LDL 98, goal < 70  Now on Lipitor 40 mg daily   Continue statin at discharge    Other Stroke Risk Factors  Advanced age  Cigarette smoker, quit smoking 34 years ago.  Hx stroke/TIA  Family hx stroke (Mother)   Other Active Problems  Mild anemia -  mild thrombocytopenia  Mild hyponatremia  Mild renal insufficiency  Low grade fever on admission - Blood cultures - no growth so far.  Hospital day # 2   Personally examined patient and images, personally conducted neuro exam,documented assessment and plan as stated above. I have personally obtained the history, evaluated lab data, reviewed imaging studies and agree with radiology interpretations.  The patient has presented with a tiny left temporal infarct which may be of embolic etiology and he remains at risk for neurological worsening, recurrent stroke and TIAs. However given his history of recent diagnosis of ALS by Dr Narda Amber and confirmatory second opinion by Dr Vallarie Mare due to his limited life expectancy is not a long-term anticoagulation candidate hence we will defer doing TEE and loop recorder. Long discussion with the patient and wife and answered questions.  Sarina Ill, MD Stroke Neurology [phone] [facility]      To contact Stroke Continuity provider,  please refer to [email or web address] After hours, contact General Neurology

## 2015-08-22 NOTE — Evaluation (Signed)
Speech Language Pathology Evaluation Patient Details Name: Brett Castaneda MRN: [id number] DOB: 02-Jan-1941 Today's Date: 08/22/2015 Time: [id number] SLP Time Calculation (min) (ACUTE ONLY): 34 min  Problem List:  Patient Active Problem List   Diagnosis Date Noted  . Cerebral infarction due to unspecified mechanism   . CVA (cerebral infarction) 08/20/2015  . Hyponatremia 08/20/2015  . Anemia due to other cause 08/20/2015  . Thrombocytopenia ([place]) 08/20/2015  . Acute ischemic stroke ([place]) 08/20/2015  . Pyrexia   . 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 Pain in limb-Left leg 07/30/2013  . Weakness of distal arms and legs 07/30/2013  . Aftercare following surgery of the circulatory system, [facility] 07/30/2013  . Sensorimotor neuropathy ([place]) 07/07/2013  . AAA (abdominal aortic aneurysm) without rupture ([place]) 04/16/2013  . CAP (community acquired pneumonia) 04/09/2013  . Chest pain- really back discomfort- sounds atypical for angina 04/09/2013  . 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 Malignant neoplasm of skin of parts of face 03/31/2009  . Essential hypertension 12/14/2008  . CONSTIPATION 06/22/2008  . Dyslipidemia 08/27/2007  . Other and unspecified hyperlipidemia 08/27/2007  . GERD 03/24/2007  . DIVERTICULOSIS, COLON 03/24/2007  . Rheumatoid arthritis ([place]) 03/24/2007  . CEREBROVASCULAR ACCIDENT, HX OF 03/24/2007   Past Medical History:  Past Medical History  Diagnosis Date  . BASAL CELL CARCINOMA, FACE 03/31/2009  . 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 CONSTIPATION 06/22/2008  . DIVERTICULOSIS, COLON 03/24/2007  . GERD 03/24/2007  . HYPERLIPIDEMIA 08/27/2007  . HYPERTENSION 12/14/2008  . Rheumatoid arthritis(714.0) 03/24/2007  . Blind right eye   . Retinal detachment     hx of  . Keratosis Oct. 2013  . Fall at home Nov. 5, 2014  06-28-13    in the  home and Outside as well  . 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 Stroke [facility]) Oct 2014  . ALS (amyotrophic lateral sclerosis) ([place]) dx jan. 14, 2015    symptons consistent, 2nd opinion at [facility] at 11/09/13   Past Surgical History:  Past Surgical History  Procedure Laterality Date  . Total knee arthroplasty      bital  . Carpal tunnel release Right 2003  . Cataract extraction  1980    removed cataract in left eye. Currently blind in right eye  . Cholecystectomy  2001    Gall Bladder  . Retinal detachment surgery  2004  . Lumbar puncture  Oct. 21, 2014  . Nerve conduction    . Mri  07-14-13  . Carotid endarterectomy Right 08-15-07    cea  . Joint replacement  2000    Right knee replacement  . Eye surgery  [id number]    retinal detachment  . Eye surgery      left cataract surgery  . Shoulder surgery Left 2009    hx "frozen" shoulder   HPI:  Pt is a 74 y/o M admitted w/ increased confusion and difficulty w/ word finding. 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 Pt's PMH includes stroke, rheumatoid arthritis, ALS blind Rt eye.   Assessment / Plan / Recommendation Clinical Impression   Pt presents with mild cognitive deficits for short term memory and functional problem solving which both pt and wife report to be baseline.  Pt presents with good safety awareness and requested to use the urinal appropriately during today's evaluation.  His speech is fluent and free of dysarthria or word finding impairment.  As a result, no further ST needs are indicated at this time.  Pt and wife in agreement with recommendations.      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)   Cognition  Overall Cognitive Status: History of cognitive impairments - at baseline Arousal/Alertness: Awake/alert Orientation Level: Oriented to person;Oriented to place;Oriented to situation;Disoriented to time Attention: Sustained Sustained Attention: Appears intact Memory: Impaired Memory Impairment: Retrieval deficit;Decreased short term memory (pt and wife report memory to be baseline) Decreased Short Term Memory: Verbal complex Awareness: Appears intact Problem Solving: Impaired Problem Solving Impairment: Verbal complex Behaviors: Poor frustration tolerance Safety/Judgment: Appears intact    Comprehension  Auditory Comprehension Overall Auditory Comprehension: Appears within functional limits for tasks assessed    Expression Expression Primary Mode of Expression: Verbal Verbal Expression Overall Verbal Expression: Appears within functional limits for tasks assessed Written Expression Dominant Hand: Right   Oral / Motor Oral Motor/Sensory Function Overall Oral Motor/Sensory Function: Within functional limits Motor Speech Overall Motor Speech: Appears within functional limits for tasks assessed    Emilio Math 08/22/2015, 8:53 AM

## 2015-08-22 NOTE — Progress Notes (Addendum)
[facility] PROGRESS NOTE    Progress Note   Brett Castaneda DOB: 1941-09-05 DOA: 08/19/2015 PCP: Nyoka Cowden, MD   Brief Narrative:   Brett Castaneda is an 74 y.o. male Past medical history of a stroke, essential hypertension and rheumatoid arthritis along with ALS who presents to the ED complaining of increased confusion that was noticed by his wife on the day of admission.  Assessment/Plan:   CVA (cerebral infarction): HgbA1c 5.6 fasting lipid panel HDL < 40 LDL < 98. Cont statins MR of the brain without contrast 3 mm acute ischemic cortical infarct within the left temporal lobe. PT eval . He doesn't have any PT needs. Echocardiogram EF 55% no source of thrombus and Carotid dopplers no ICA stenosis B/L. TEE will be done on 08/23/2015 and also need a loop recorder. Prophylactic therapy-Antiplatelet med: on plavix at home home will need ASA and plavix. EEG mild generalized nonspecific reticular slowing of cerebral activity.  AKI.Hyponatremia: Likely pre-renal, FENA > 1 obtain after IV fluids given. AKI and hyponatremia improved with IV hydration.  Dyslipidemia Start stains  Essential hypertension Has been ranging [id number] systolic.  Rheumatoid arthritis ([place]): Continue methotrexate and folic acid.  Anemia due to other cause Due to chronic disease, Awaiting an anemia panel  Mild Thrombocytopenia (HCC) Continue to monitor.  Temp 100.3 With no further elevation in temp and no leukocytosis, no cough, SOB or dysuria. Influenza PCR negative.    DVT Prophylaxis - Lovenox ordered.  Family Communication: none Disposition Plan: Home in 1 day. Code Status:     Code Status Orders        Start     Ordered   08/20/15 0142  Full code   Continuous     08/20/15 0141        IV Access:    Peripheral IV   Procedures and diagnostic studies:   No results found.   Medical Consultants:    None.  Anti-Infectives:   Anti-infectives     None      Subjective:    Brett Castaneda no complains  Objective:    Filed Vitals:   08/21/15 2110 08/22/15 0208 08/22/15 0554 08/22/15 0945  BP: 140/75 172/85 156/88 158/91  Pulse: 83 86 78 74  Temp: 99 F (37.2 C) 98.3 F (36.8 C) 98 F (36.7 C) 98.7 F (37.1 C)  TempSrc: Oral Oral Oral Oral  Resp: 18 18 18 18   Height:      Weight:      SpO2: 98% 98% 97% 92%    Intake/Output Summary (Last 24 hours) at 08/22/15 0959 Last data filed at 08/22/15 0429  Gross per 24 hour  Intake      0 ml  Output    700 ml  Net   -700 ml   Filed Weights   08/20/15 0100  Weight: 77.928 kg (171 lb 12.8 oz)    Exam: Gen:  NAD Cardiovascular:  RRR. Chest and lungs:   CTA B/L Abdomen:  Abdomen soft, NT/ND, + BS  Extremities:  No C/E/C   Data Reviewed:    Labs: Basic Metabolic Panel:  Recent Labs Lab 08/19/15 2035 08/19/15 2044 08/21/15 1337 08/22/15 0730  NA 133* 127* 134* 134*  K 4.0 3.8 3.6 3.8  CL 98* 98* 102 104  CO2  --  21* 27 23  GLUCOSE 119* 119* 105* 107*  BUN 15 12 14 13   CREATININE 1.30* 1.26* 1.38* 1.07  CALCIUM  --  8.3* 8.4* 8.6*  GFR Estimated Creatinine Clearance: 66.5 mL/min (by C-G formula based on Cr of 1.07). Liver Function Tests:  Recent Labs Lab 08/19/15 2044  AST 25  ALT 17  ALKPHOS 46  BILITOT 0.7  PROT 7.0  ALBUMIN 3.4*   No results for input(s): LIPASE, AMYLASE in the last 168 hours. No results for input(s): AMMONIA in the last 168 hours. Coagulation profile  Recent Labs Lab 08/19/15 2044  INR 1.13    CBC:  Recent Labs Lab 08/19/15 2035 08/19/15 2044 08/21/15 1337  WBC  --  7.0 7.5  NEUTROABS  --  4.3  --   HGB 13.3 12.5* 12.3*  HCT 39.0 36.2* 36.0*  MCV  --  92.8 93.0  PLT  --  126* 139*   Cardiac Enzymes: No results for input(s): CKTOTAL, CKMB, CKMBINDEX, TROPONINI in the last 168 hours. BNP (last 3 results) No results for input(s): PROBNP in the last 8760 hours. CBG:  Recent Labs Lab 08/19/15 2046    GLUCAP 118*   D-Dimer: No results for input(s): DDIMER in the last 72 hours. Hgb A1c:  Recent Labs  08/20/15 0323 08/21/15 1337  HGBA1C 5.6 5.5   Lipid Profile:  Recent Labs  08/20/15 0323  CHOL 137  HDL 25*  LDLCALC 98  TRIG 72  CHOLHDL 5.5   Thyroid function studies: No results for input(s): TSH, T4TOTAL, T3FREE, THYROIDAB in the last 72 hours.  Invalid input(s): FREET3 Anemia work up: No results for input(s): VITAMINB12, FOLATE, FERRITIN, TIBC, IRON, RETICCTPCT in the last 72 hours. Sepsis Labs:  Recent Labs Lab 08/19/15 2044 08/21/15 1337  WBC 7.0 7.5   Microbiology Recent Results (from the past 240 hour(s))  Blood culture (routine x 2)     Status: None (Preliminary result)   Collection Time: 08/19/15  9:45 PM  Result Value Ref Range Status   Specimen Description BLOOD RIGHT FOREARM  Final   Special Requests IN PEDIATRIC BOTTLE [place]  Final   Culture NO GROWTH 2 DAYS  Final   Report Status PENDING  Incomplete  Blood culture (routine x 2)     Status: None (Preliminary result)   Collection Time: 08/19/15  9:51 PM  Result Value Ref Range Status   Specimen Description BLOOD RIGHT HAND  Final   Special Requests BOTTLES DRAWN AEROBIC AND ANAEROBIC 5CC  Final   Culture NO GROWTH 2 DAYS  Final   Report Status PENDING  Incomplete     Medications:   . acidophilus  1 capsule Oral Daily  . aspirin EC  81 mg Oral Daily  . atorvastatin  40 mg Oral q1800  . beta carotene w/minerals  1 tablet Oral Daily  . clopidogrel  75 mg Oral Daily  . enoxaparin (LOVENOX) injection  40 mg Subcutaneous Daily  . feeding supplement (ENSURE ENLIVE)  237 mL Oral BID BM  . folic acid-pyridoxine-cyancobalamin  1 tablet Oral Q Sat  . latanoprost  1 drop Left Eye QHS  . methotrexate  10 mg Oral Q Sat  . pantoprazole  40 mg Oral Daily  . tamsulosin  0.4 mg Oral Daily  . timolol  1 drop Left Eye QPM   Continuous Infusions:    Time spent: 15 min   LOS: 2 days   Charlynne Cousins  [facility] Pager [phone]  *Please refer to [email or web address], password [email or web address] to get updated schedule on who will round on this patient, as hospitalists switch teams weekly. If 7PM-7AM, please contact night-coverage at [email or web address], password [email or web address] for  any overnight needs.  08/22/2015, 9:59 AM

## 2015-08-23 ENCOUNTER — Encounter (HOSPITAL_COMMUNITY): Payer: Self-pay | Admitting: Emergency Medicine

## 2015-08-23 ENCOUNTER — Emergency Department (HOSPITAL_COMMUNITY): Payer: Medicare Other

## 2015-08-23 ENCOUNTER — Other Ambulatory Visit: Payer: Self-pay | Admitting: Physician Assistant

## 2015-08-23 ENCOUNTER — Encounter (HOSPITAL_COMMUNITY)
Admission: EM | Disposition: A | Discharge status: Home / Self Care | Payer: Self-pay | Source: Home / Self Care | Attending: Internal Medicine

## 2015-08-23 ENCOUNTER — Telehealth: Payer: Self-pay | Admitting: Physician Assistant

## 2015-08-23 ENCOUNTER — Inpatient Hospital Stay (HOSPITAL_COMMUNITY)
Admission: EM | Admit: 2015-08-23 | Discharge: 2015-08-25 | DRG: 261 | Disposition: A | Discharge status: Home Health | Payer: Medicare Other | Attending: Internal Medicine | Admitting: Internal Medicine

## 2015-08-23 DIAGNOSIS — R079 Chest pain, unspecified: Secondary | ICD-10-CM

## 2015-08-23 DIAGNOSIS — R55 Syncope and collapse: Secondary | ICD-10-CM | POA: Diagnosis not present

## 2015-08-23 DIAGNOSIS — Z85828 Personal history of other malignant neoplasm of skin: Secondary | ICD-10-CM | POA: Diagnosis not present

## 2015-08-23 DIAGNOSIS — G1221 Amyotrophic lateral sclerosis: Secondary | ICD-10-CM | POA: Diagnosis present

## 2015-08-23 DIAGNOSIS — Z791 Long term (current) use of non-steroidal anti-inflammatories (NSAID): Secondary | ICD-10-CM

## 2015-08-23 DIAGNOSIS — Z7983 Long term (current) use of bisphosphonates: Secondary | ICD-10-CM | POA: Diagnosis not present

## 2015-08-23 DIAGNOSIS — I4581 Long QT syndrome: Secondary | ICD-10-CM | POA: Diagnosis present

## 2015-08-23 DIAGNOSIS — E785 Hyperlipidemia, unspecified: Secondary | ICD-10-CM | POA: Diagnosis not present

## 2015-08-23 DIAGNOSIS — I10 Essential (primary) hypertension: Secondary | ICD-10-CM | POA: Diagnosis not present

## 2015-08-23 DIAGNOSIS — M069 Rheumatoid arthritis, unspecified: Secondary | ICD-10-CM | POA: Diagnosis not present

## 2015-08-23 DIAGNOSIS — I69351 Hemiplegia and hemiparesis following cerebral infarction affecting right dominant side: Secondary | ICD-10-CM

## 2015-08-23 DIAGNOSIS — Z7982 Long term (current) use of aspirin: Secondary | ICD-10-CM

## 2015-08-23 DIAGNOSIS — Z96653 Presence of artificial knee joint, bilateral: Secondary | ICD-10-CM | POA: Diagnosis present

## 2015-08-23 DIAGNOSIS — S0101XA Laceration without foreign body of scalp, initial encounter: Secondary | ICD-10-CM | POA: Diagnosis not present

## 2015-08-23 DIAGNOSIS — S0181XA Laceration without foreign body of other part of head, initial encounter: Secondary | ICD-10-CM | POA: Diagnosis not present

## 2015-08-23 DIAGNOSIS — Y92002 Bathroom of unspecified non-institutional (private) residence single-family (private) house as the place of occurrence of the external cause: Secondary | ICD-10-CM

## 2015-08-23 DIAGNOSIS — Z66 Do not resuscitate: Secondary | ICD-10-CM | POA: Diagnosis present

## 2015-08-23 DIAGNOSIS — Z79899 Other long term (current) drug therapy: Secondary | ICD-10-CM

## 2015-08-23 DIAGNOSIS — Z7902 Long term (current) use of antithrombotics/antiplatelets: Secondary | ICD-10-CM | POA: Diagnosis not present

## 2015-08-23 DIAGNOSIS — R0789 Other chest pain: Secondary | ICD-10-CM | POA: Diagnosis not present

## 2015-08-23 DIAGNOSIS — H5441 Blindness, right eye, normal vision left eye: Secondary | ICD-10-CM | POA: Diagnosis present

## 2015-08-23 DIAGNOSIS — S0191XA Laceration without foreign body of unspecified part of head, initial encounter: Secondary | ICD-10-CM

## 2015-08-23 DIAGNOSIS — Z87891 Personal history of nicotine dependence: Secondary | ICD-10-CM | POA: Diagnosis not present

## 2015-08-23 DIAGNOSIS — R42 Dizziness and giddiness: Secondary | ICD-10-CM | POA: Diagnosis not present

## 2015-08-23 DIAGNOSIS — E860 Dehydration: Secondary | ICD-10-CM | POA: Diagnosis present

## 2015-08-23 DIAGNOSIS — Z8673 Personal history of transient ischemic attack (TIA), and cerebral infarction without residual deficits: Secondary | ICD-10-CM

## 2015-08-23 DIAGNOSIS — R531 Weakness: Secondary | ICD-10-CM | POA: Diagnosis not present

## 2015-08-23 DIAGNOSIS — W1830XA Fall on same level, unspecified, initial encounter: Secondary | ICD-10-CM | POA: Diagnosis present

## 2015-08-23 DIAGNOSIS — N179 Acute kidney failure, unspecified: Secondary | ICD-10-CM | POA: Diagnosis present

## 2015-08-23 DIAGNOSIS — Y93E1 Activity, personal bathing and showering: Secondary | ICD-10-CM

## 2015-08-23 DIAGNOSIS — R52 Pain, unspecified: Secondary | ICD-10-CM

## 2015-08-23 LAB — BASIC METABOLIC PANEL
ANION GAP: 7 (ref 5–15)
BUN: 13 mg/dL (ref 6–20)
CO2: 24 mmol/L (ref 22–32)
Calcium: 8.5 mg/dL — ABNORMAL LOW (ref 8.9–10.3)
Chloride: 105 mmol/L (ref 101–111)
Creatinine, Ser: 1.16 mg/dL (ref 0.61–1.24)
GFR calc Af Amer: >60 mL/min (ref >60)
Glucose, Bld: 104 mg/dL — ABNORMAL HIGH (ref 65–99)
POTASSIUM: 3.8 mmol/L (ref 3.5–5.1)
SODIUM: 136 mmol/L (ref 135–145)

## 2015-08-23 LAB — CBC
HEMATOCRIT: 37 % — AB (ref 39.0–52.0)
Hemoglobin: 12.5 g/dL — ABNORMAL LOW (ref 13.0–17.0)
MCH: 31.4 pg (ref 26.0–34.0)
MCHC: 33.8 g/dL (ref 30.0–36.0)
MCV: 93 fL (ref 78.0–100.0)
Platelets: 262 10*3/uL (ref 150–400)
RBC: 3.98 MIL/uL — ABNORMAL LOW (ref 4.22–5.81)
RDW: 13.6 % (ref 11.5–15.5)
WBC: 9.5 10*3/uL (ref 4.0–10.5)

## 2015-08-23 LAB — COMPREHENSIVE METABOLIC PANEL
ALBUMIN: 3.1 g/dL — AB (ref 3.5–5.0)
ALT: 96 U/L — AB (ref 17–63)
AST: 126 U/L — AB (ref 15–41)
Alkaline Phosphatase: 50 U/L (ref 38–126)
Anion gap: 9 (ref 5–15)
BILIRUBIN TOTAL: 1.6 mg/dL — AB (ref 0.3–1.2)
BUN: 16 mg/dL (ref 6–20)
CO2: 22 mmol/L (ref 22–32)
Calcium: 8.5 mg/dL — ABNORMAL LOW (ref 8.9–10.3)
Chloride: 102 mmol/L (ref 101–111)
Creatinine, Ser: 1.39 mg/dL — ABNORMAL HIGH (ref 0.61–1.24)
GFR calc Af Amer: 57 mL/min — ABNORMAL LOW (ref >60)
GFR calc non Af Amer: 49 mL/min — ABNORMAL LOW (ref >60)
GLUCOSE: 148 mg/dL — AB (ref 65–99)
POTASSIUM: 4.6 mmol/L (ref 3.5–5.1)
Sodium: 133 mmol/L — ABNORMAL LOW (ref 135–145)
TOTAL PROTEIN: 6.8 g/dL (ref 6.5–8.1)

## 2015-08-23 LAB — I-STAT TROPONIN, ED: Troponin i, poc: 0 ng/mL (ref 0.00–0.08)

## 2015-08-23 LAB — PROTIME-INR
INR: 1.11 (ref 0.00–1.49)
PROTHROMBIN TIME: 14.4 s (ref 11.6–15.2)

## 2015-08-23 LAB — D-DIMER, QUANTITATIVE: D-Dimer, Quant: 3.87 ug/mL-FEU — ABNORMAL HIGH (ref 0.00–0.50)

## 2015-08-23 SURGERY — ECHOCARDIOGRAM, TRANSESOPHAGEAL
Anesthesia: Moderate Sedation

## 2015-08-23 MED ORDER — ASPIRIN EC 325 MG PO TBEC
325.0000 mg | DELAYED_RELEASE_TABLET | Freq: Every day | ORAL | Status: DC
  Administered 2015-08-23: 325 mg via ORAL
  Filled 2015-08-23: qty 1

## 2015-08-23 MED ORDER — SODIUM CHLORIDE 0.9 % IV BOLUS (SEPSIS)
1000.0000 mL | Freq: Once | INTRAVENOUS | Status: AC
  Administered 2015-08-23: 1000 mL via INTRAVENOUS

## 2015-08-23 MED ORDER — ASPIRIN EC 325 MG PO TBEC: 325.0000 mg | DELAYED_RELEASE_TABLET | Freq: Every day | ORAL | Status: DC

## 2015-08-23 MED ORDER — IOHEXOL 350 MG/ML SOLN
100.0000 mL | Freq: Once | INTRAVENOUS | Status: AC | PRN
  Administered 2015-08-23: 100 mL via INTRAVENOUS

## 2015-08-23 MED ORDER — TETANUS-DIPHTH-ACELL PERTUSSIS 5-2.5-18.5 LF-MCG/0.5 IM SUSP
0.5000 mL | Freq: Once | INTRAMUSCULAR | Status: AC
  Administered 2015-08-23: 0.5 mL via INTRAMUSCULAR
  Filled 2015-08-23: qty 0.5

## 2015-08-23 MED ORDER — LIDOCAINE-EPINEPHRINE (PF) 2 %-1:200000 IJ SOLN
20.0000 mL | Freq: Once | INTRAMUSCULAR | Status: AC
  Administered 2015-08-23: 20 mL
  Filled 2015-08-23: qty 20

## 2015-08-23 NOTE — Care Management Important Message (Signed)
Important Message  Patient Details  Name: Brett Castaneda MRN: [id number] Date of Birth: Apr 28, 1941   Medicare Important Message Given:  Yes    Barb Merino Jordy Hewins 08/23/2015, 8:51 AM

## 2015-08-23 NOTE — Telephone Encounter (Signed)
Birdie Sons (coordinator) received request from Dr. Leonie Man to arrange 30-day event monitor for stroke. Patient is likely being discharged today. Order was placed in [place]. Please arrange and call patient with this information. Augustine Brannick PA-C

## 2015-08-23 NOTE — Progress Notes (Signed)
Birdie Sons (coordinator) received request from Dr. Leonie Man to arrange 30-day event monitor for stroke. Sent message to [employer] to help arrange. They will call patient with this information. Eddis Pingleton PA-C

## 2015-08-23 NOTE — Progress Notes (Signed)
STROKE TEAM PROGRESS NOTE   SUBJECTIVE (INTERVAL HISTORY) Wife and daughter at the bedside. Dr. Leonie Man discussed his ALS and neuropathy dx with pt, wife and daughter. Globally, they are not sure he would be a good long-term anticoagulation, but all agree it is worth looking for afib. Agreeable to 30-day monitor and consider anticoagulation if atrial fibrillation found.    OBJECTIVE Temp:  [98.1 F (36.7 C)-99.1 F (37.3 C)] 98.1 F (36.7 C) (11/29 1028) Pulse Rate:  [66-89] 74 (11/29 1028) Cardiac Rhythm:  [-] Normal sinus rhythm (11/29 0700) Resp:  [16-18] 16 (11/29 1028) BP: (140-158)/(77-96) 144/88 mmHg (11/29 1028) SpO2:  [97 %-99 %] 97 % (11/29 1028)  CBC:  Recent Labs Lab 08/19/15 2044 08/21/15 1337  WBC 7.0 7.5  NEUTROABS 4.3  --   HGB 12.5* 12.3*  HCT 36.2* 36.0*  MCV 92.8 93.0  PLT 126* [id number]*   Basic Metabolic Panel:  Recent Labs Lab 08/22/15 0730 08/23/15 0211  NA 134* 136  K 3.8 3.8  CL 104 105  CO2 23 24  GLUCOSE 107* 104*  BUN 13 13  CREATININE 1.07 1.16  CALCIUM 8.6* 8.5*   Lipid Panel:     Component Value Date/Time   CHOL 137 08/20/2015 0323   TRIG 72 08/20/2015 0323   HDL 25* 08/20/2015 0323   CHOLHDL 5.5 08/20/2015 0323   VLDL 14 08/20/2015 0323   LDLCALC 98 08/20/2015 0323   HgbA1c:  Lab Results  Component Value Date   HGBA1C 5.5 08/21/2015   Urine Drug Screen:     Component Value Date/Time   LABOPIA NONE DETECTED 08/19/2015 2052   COCAINSCRNUR NONE DETECTED 08/19/2015 2052   LABBENZ NONE DETECTED 08/19/2015 2052   AMPHETMU NONE DETECTED 08/19/2015 2052   THCU NONE DETECTED 08/19/2015 2052   LABBARB NONE DETECTED 08/19/2015 2052      IMAGING  Dg Chest 2 View 08/19/2015   Fibrosis in the lung bases. Mild cardiac enlargement. No evidence of active pulmonary disease.   Ct Head Wo Contrast 08/19/2015   No acute intracranial abnormalities. Chronic atrophy and small vessel ischemic changes. Old right frontal infarct.   Mr Brain  Wo Contrast 08/19/2015   1. 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  2. No other acute intracranial process identified.  3. Moderate to large remote infarct within the posterior right frontal lobe with extension into the operculum. Additional remote lacunar infarcts involving the right basal ganglia.  4. Nonvisualization of the right vertebral artery, which may be occluded. This is stable relative to previous MRI from 2014.  5. Atrophy with mild chronic small vessel ischemic disease.   2D Echocardiogram  - Left ventricle: The cavity size was normal. There was focal basalhypertrophy. Systolic function was normal. The estimated ejectionfraction was in the range of 60% to 65%. Diastolic function isindeterminate. Wall motion was normal; there were no regionalwall motion abnormalities. - Aortic valve: Mildly calcified annulus. Trileaflet; mildlythickened leaflets. Valve area (VTI): 2.97 cm^2. Valve area(Vmax): 3.12 cm^2. - Mitral valve: There was mild regurgitation. - Left atrium: The atrium was severely dilated. - Systemic veins: IVC is small, suggesting low RA pressures andhypovolemia. - Technically adequate study.  Carotid Doppler   Right: CEA is patent. Left: intimal wall thickening CCA. Moderate protruding calcific plaque with acoustic shadowing ECA origin. Mild to moderate mixed plaque origin and proximal ICA. Bilateral: 1-39% ICA plaquing. Vertebral artery flow is antegrade.   PHYSICAL EXAM Gen: NAD, conversant CV: RRR, no MRG.  No Carotid Bruits. No peripheral edema, warm, nontender Eyes: Conjunctivae clear without exudates or hemorrhage Neuro: Detailed Neurologic Exam Speech:    Speech is normal; fluent and spontaneous with normal comprehension. No cerebellar speech. 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 Left pupil is reactive with intact vual fields. Extraocular movements are intact in the left eye. Trigeminal sensation is intact and the muscles of mastication are normal. Mild facial weakness on smile with right NL flattening. The palate elevates in the midline. Hearing intact to voice. Voice is normal. Shoulder shrug is normal. The tongue has normal motion without fasciculations.  Motor Observation: no involuntary movements noted. Distal atrophy in the extremities. No fasciculations noted. No tongue atrophy. Tortuous but is not brisk. Tone:    Normal muscle tone.   Strength:    Strength is largely intact in the UE proximally (some chronic mild left arm weakness) with symmetric distal weakness most pronounced in the intrinsic hand muscles. Can lift both legs simultaneously off of the bed and hold there easily. Some distal weakness in the lower extremities most pronounced in the DF 3/5. These are chronic. Sensation: intact to LT Reflex Exam: DTR's:    Absent AJs otherwise deep tendon reflexes in the upper and lower extremities are brisk bilaterally.   Toes:    The toes are equivocal bilaterally.  Question left babinski.    ASSESSMENT/PLAN Mr. Brett Castaneda is a 74 y.o. male with history of a previous stroke with residual mild left hemiparesis, hypertension, hyperlipidemia, status post right carotid endarterectomy, and ALS presenting with altered mental status. He did not receive IV t-PA due to mild deficits.  Stroke:  l temporal lobe infarcts, embolic secondary to an unknown source, suspect atrial fibrillation   Resultant altered mental status resolved  MRI Question tiny 3 mm acute ischemic cortical infarct within the left temporal lobe.   Defer TEE and loop recorder placement given previous dx of ALS. Discussed in depth with pt, wife and daughter.  Recommend 30 day event monitor. Cardiology notified.  MRA  not performed.   Carotid Doppler - 1-39% ICA  stenosis. Right CEA is patent. Vertebral artery flow is antegrade.   2D Echo - EF 60-65%. No cardiac source of emboli identified.  LDL - 98  HgbA1c 5.5  VTE prophylaxis - Lovenox Diet NPO time specified Except for: Sips with Meds  clopidogrel 75 mg daily prior to admission, now on aspirin 81 mg daily and clopidogrel 75 mg daily . As stroke occurred on plavix, recommend discharge on aspirin 325 mg daily alone. IP orders adjusted  Patient counseled to be compliant with his antithrombotic medications  Ongoing aggressive stroke risk factor management  Therapy recommendations: Home health physical therapy recommended.  Disposition: home with OP therapies. Pt agreeable.  Follow up with Dr. Leonie Man (order written) - pt requests not to see Dr. Posey Pronto again. He last saw her in 2014  Hypertension  Stable  Hyperlipidemia  Home meds:  No lipid-lowering medications prior to admission  LDL 98, goal < 70  Now on Lipitor 40 mg daily   Continue statin at discharge  Other Stroke Risk Factors  Advanced age  Cigarette smoker, quit smoking 34 years ago.  Hx stroke/TIA  Family hx stroke (Mother)  Other Active Problems  Mild anemia - mild thrombocytopenia  Mild hyponatremia  Mild renal insufficiency  Low grade fever on admission - Blood cultures - no growth so far.  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 Exam not consistent with ALS but has mild distal neuropathy with gait and balance issues.Marland Kitchen  Hospital day # [place] for Pager information 08/23/2015 1:01 PM  I have personally examined this patient, reviewed notes, independently viewed imaging studies, participated in medical decision making and plan of care. I have made any additions or clarifications directly to the above note. Agree with note above.  Puzzling scene about  prior diagnosis of ALS 2 years ago but clinically patient does not seem to have signs to suggest ALS. I did  not see significant muscle atrophy or fasciculations. He does have mild distal weakness and gait imbalance from a peripheral neuropathy probably related to his methotrexate for chronic rheumatoid arthritis. She is a good long-term anticoagulation candidate due to his neuropathy gait and balance issues hence we will not do a loop recorder but will do outpatient 30 day cardiac monitor. If A. fib is discovered will have to make a definite call for for anticoagulation at that time. Change Plavix to aspirin for secondary stroke prevention. Long discussion of the bedside with the patient, wife and daughter and answered questions Antony Contras, MD Medical Director [place] Pager: [phone] 08/23/2015 1:09 PM  To contact Stroke Continuity provider, please refer to [email or web address] After hours, contact General Neurology

## 2015-08-23 NOTE — Care Management Note (Signed)
Case Management Note  Patient Details  Name: DOREN KASPAR MRN: [id number] Date of Birth: [birth year]  Subjective/Objective:                    Action/Plan: Met with patient and wife to discuss discharge planning. Per discussion with attending MD, patient has orders for HHPT/OT.  Patient is declining [place] services at this time.  CM encouraged patient and wife to let staff know if they change their minds prior to discharge.  CM explained that if any needs arise after discharge, they can contact their PCP to discuss initiating services as appropriate.  Expected Discharge Date:                  Expected Discharge Plan:  Home/Self Care  In-House Referral:     Discharge planning Services  CM Consult  Post Acute Care Choice:    Choice offered to:  Patient, Spouse  DME Arranged:    DME Agency:     HH Arranged:    [place] Agency:     Status of Service:  Completed, signed off  Medicare Important Message Given:  Yes Date Medicare IM Given:    Medicare IM give by:    Date Additional Medicare IM Given:    Additional Medicare Important Message give by:     If discussed at [place] of Stay Meetings, dates discussed:    Additional Comments:  Rolm Baptise, RN 08/23/2015, 10:15 AM

## 2015-08-23 NOTE — ED Notes (Signed)
Pt. presents with scalp laceration approx. 2" at left upper head sustained this evening at home , spouse reported syncopal episode at the bathroom this evening , presents pale /lethargic and diaphoretic .

## 2015-08-23 NOTE — Progress Notes (Signed)
Pt discharged home with wife and daughter. Refused home health. Prefers outpatient physical therapy. IV discontinue and discharge information given. Left unit via wheelchair with volunteer services at 1330. Wendee Copp

## 2015-08-23 NOTE — ED Provider Notes (Addendum)
CSN: [id number]     Arrival date & time 08/23/15  2131 History   First MD Initiated Contact with Patient 08/23/15 2205     Chief Complaint  Patient presents with  . Head Laceration     (Consider location/radiation/quality/duration/timing/severity/associated sxs/prior Treatment) Patient is a 74 y.o. male presenting with scalp laceration.  Head Laceration Pertinent negatives include no chest pain, no abdominal pain and no shortness of breath.  Patient c/o acutely becoming faint this evening while in shower.  Was just d/c from hospital earlier today with small cva.  At home, was showering, and states felt as if he wouldn't be able to dry self off - unclear why.  Then he felt faint, lightheaded, fainted, and fell forward to ground, hitting head. Brief loc. Was c/o acute back pain (all over), and also notes some transient chest/back pain.  Denies other recent cp or discomfort. No pleuritic pain. Denies cough or uri c/o. No fever or chills. Subsequently pt also notes episode nv x 1, emesis not bloody or bilious. No diarrhea. No dysuria.  +frontal scalp/forehead laceration, 4 cm, minor bleeding stopped w pressure. Mild headache post fall. No neck pain. No radicular pain. No numbness/weakness.      Past Medical History  Diagnosis Date  . BASAL CELL CARCINOMA, FACE 03/31/2009  . 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 CONSTIPATION 06/22/2008  . DIVERTICULOSIS, COLON 03/24/2007  . GERD 03/24/2007  . HYPERLIPIDEMIA 08/27/2007  . HYPERTENSION 12/14/2008  . Rheumatoid arthritis(714.0) 03/24/2007  . Blind right eye   . Retinal detachment     hx of  . Keratosis Oct. 2013  . Fall at home Nov. 5, 2014  06-28-13    in the home and Outside as well  . 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 Stroke [facility]) Oct 2014  . 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 Total knee arthroplasty      bital  . Carpal tunnel release Right 2003  . Cataract extraction  1980    removed cataract in left eye. Currently blind in right eye  . Cholecystectomy  2001    Gall Bladder  . Retinal detachment surgery  2004  . Lumbar puncture  Oct. 21, 2014  . Nerve conduction    . Mri  07-14-13  . Carotid endarterectomy Right 08-15-07    cea  . Joint replacement  2000    Right knee replacement  . Eye surgery  [id number]    retinal detachment  . Eye surgery      left cataract surgery  . Shoulder surgery Left 2009    hx "frozen" shoulder   Family History  Problem Relation Age of Onset  . Stroke Mother 16  . Hypertension Mother   . Heart disease Father   . Aneurysm Father   . Coronary artery disease Sister   . Pneumonia Sister   . Heart disease Sister   . Diabetes Sister   . Hypertension Sister   . Colon cancer Neg Hx    Social History  Substance Use Topics  . Smoking status: Former Smoker -- 23 years    Types: Cigarettes    Quit date: 09/24/1980  . Smokeless tobacco: Never Used  . Alcohol Use: No    Review of Systems  Constitutional: Negative for fever and  chills.  HENT: Negative for sore throat and trouble swallowing.   Eyes: Negative for pain and visual disturbance.  Respiratory: Negative for cough and shortness of breath.   Cardiovascular: Negative for chest pain, palpitations and leg swelling.  Gastrointestinal: Positive for nausea and vomiting. Negative for abdominal pain, diarrhea and blood in stool.  Endocrine: Negative for polyuria.  Genitourinary: Negative for dysuria and flank pain.  Musculoskeletal: Negative for neck pain.  Skin: Negative for rash.  Neurological: Positive for syncope. Negative for weakness and numbness.  Hematological: Does not bruise/bleed easily.  Psychiatric/Behavioral: Negative for confusion.      Allergies  Dopamine; Morphine and related; and Vicodin  Home Medications    Prior to Admission medications   Medication Sig Start Date End Date Taking? Authorizing Provider  acetaminophen (TYLENOL) 500 MG tablet Take 500 mg by mouth every 6 (six) hours as needed for mild pain, moderate pain or fever.    Yes Historical Provider, MD  alendronate (FOSAMAX) 70 MG tablet Take 70 mg by mouth once a week. Take with a full glass of water on an empty stomach. Takes on Wednesday   Yes Historical Provider, MD  aspirin 325 MG tablet Take 325 mg by mouth daily.   Yes Historical Provider, MD  folic acid-pyridoxine-cyancobalamin (VIRT-VITE FORTE) 2.5-25-2 MG TABS Take 1 tablet by mouth once a week. Takes on days he takes methotrexate. (Saturdays)   Yes Historical Provider, MD  latanoprost (XALATAN) 0.005 % ophthalmic solution Place 1 drop into the left eye at bedtime.    Yes Historical Provider, MD  methotrexate (RHEUMATREX) 2.5 MG tablet Take 10 mg by mouth once a week. Caution:Chemotherapy. Protect from light.   Yes Historical Provider, MD  Multiple Vitamins-Minerals (OCUVITE ADULT 50+) CAPS Take 1 capsule by mouth daily.   Yes Historical Provider, MD  polyethylene glycol (MIRALAX / GLYCOLAX) packet Take 17 g by mouth daily as needed (for constipation).   Yes Historical Provider, MD  Probiotic Product (PROBIOTIC DAILY PO) Take 1 tablet by mouth daily.   Yes Historical Provider, MD  sodium chloride (OCEAN) 0.65 % SOLN nasal spray Place 1 spray into both nostrils 3 (three) times daily.   Yes Historical Provider, MD  tamsulosin (FLOMAX) 0.4 MG CAPS capsule Take 0.4 mg by mouth daily. 08/10/15  Yes Historical Provider, MD  timolol (TIMOPTIC) 0.5 % ophthalmic solution Place 1 drop into the left eye every evening.    Yes Historical Provider, MD  aspirin EC 81 MG EC tablet Take 1 tablet (81 mg total) by mouth daily. Patient not taking: Reported on 08/23/2015 08/21/15   Charlynne Cousins, MD  clopidogrel (PLAVIX) 75 MG tablet Take 1 tablet (75 mg total) by mouth daily. Patient not taking:  Reported on 08/23/2015 11/02/14   Marletta Lor, MD   BP 138/81 mmHg  Pulse 81  Temp(Src) 98.1 F (36.7 C) (Oral)  Resp 15  SpO2 97% Physical Exam  Constitutional: He is oriented to person, place, and time. He appears well-developed and well-nourished. No distress.  HENT:  4 cm left forehead/frontal laceration. No fb seen or felt.  Eyes: Conjunctivae are normal.  Blind right eye (chronic). Left reactive.   Neck: Normal range of motion. Neck supple. No tracheal deviation present. No thyromegaly present.  No stiffness or rigidity. No bruits.   Cardiovascular: Normal rate, regular rhythm, normal heart sounds and intact distal pulses.  Exam reveals no gallop and no friction rub.   No murmur heard. Pulmonary/Chest: Effort normal and breath sounds normal.  No accessory muscle usage. No respiratory distress. He exhibits no tenderness.  Abdominal: Soft. Bowel sounds are normal. He exhibits no distension and no mass. There is no tenderness. There is no rebound and no guarding.  No puls mass.   Genitourinary:  No cva tenderness.   Musculoskeletal: Normal range of motion. He exhibits no edema or tenderness.  CTLS spine, non tender, aligned, no step off. Good rom bil ext without pain or focal bony tenderness.   Neurological: He is alert and oriented to person, place, and time.  Motor intact bil, stre right 5/5, left 4+/5.  No apparent facial weakness or asymmetry. sens grossly intact.   Skin: Skin is warm and dry. No rash noted. He is not diaphoretic.  Psychiatric: He has a normal mood and affect.  Nursing note and vitals reviewed.   ED Course  .Marland KitchenLaceration Repair Date/Time: 08/23/2015 11:52 PM Performed by: Lajean Saver Authorized by: Lajean Saver Consent: Verbal consent obtained. Risks and benefits: risks, benefits and alternatives were discussed Consent given by: patient Patient understanding: patient states understanding of the procedure being performed Patient identity  confirmed: arm band and verbally with patient Body area: head/neck Laceration length: 4 cm Anesthesia: local infiltration Local anesthetic: lidocaine 2% with epinephrine Patient sedated: no Preparation: Patient was prepped and draped in the usual sterile fashion. Irrigation solution: saline Irrigation method: syringe Amount of cleaning: standard Skin closure: 5-0 Prolene Number of sutures: 8 Technique: simple Dressing: 4x4 sterile gauze Patient tolerance: Patient tolerated the procedure well with no immediate complications   (including critical care time) Labs Review  Results for orders placed or performed during the hospital encounter of 08/23/15  CBC  Result Value Ref Range   WBC 9.5 4.0 - 10.5 K/uL   RBC 3.98 (L) 4.22 - 5.81 MIL/uL   Hemoglobin 12.5 (L) 13.0 - 17.0 g/dL   HCT 37.0 (L) 39.0 - 52.0 %   MCV 93.0 78.0 - 100.0 fL   MCH 31.4 26.0 - 34.0 pg   MCHC 33.8 30.0 - 36.0 g/dL   RDW 13.6 11.5 - 15.5 %   Platelets 262 150 - 400 K/uL  Protime-INR  Result Value Ref Range   Prothrombin Time 14.4 11.6 - 15.2 seconds   INR 1.11 0.00 - 1.49  Comprehensive metabolic panel  Result Value Ref Range   Sodium 133 (L) 135 - 145 mmol/L   Potassium 4.6 3.5 - 5.1 mmol/L   Chloride 102 101 - 111 mmol/L   CO2 22 22 - 32 mmol/L   Glucose, Bld 148 (H) 65 - 99 mg/dL   BUN 16 6 - 20 mg/dL   Creatinine, Ser 1.39 (H) 0.61 - 1.24 mg/dL   Calcium 8.5 (L) 8.9 - 10.3 mg/dL   Total Protein 6.8 6.5 - 8.1 g/dL   Albumin 3.1 (L) 3.5 - 5.0 g/dL   AST 126 (H) 15 - 41 U/L   ALT 96 (H) 17 - 63 U/L   Alkaline Phosphatase 50 38 - 126 U/L   Total Bilirubin 1.6 (H) 0.3 - 1.2 mg/dL   GFR calc non Af Amer 49 (L) >60 mL/min   GFR calc Af Amer 57 (L) >60 mL/min   Anion gap 9 5 - 15  D-dimer, quantitative (not at [facility])  Result Value Ref Range   D-Dimer, Quant 3.87 (H) 0.00 - 0.50 ug/mL-FEU  I-Stat Troponin, ED (not at [facility])  Result Value Ref Range   Troponin i, poc 0.00 0.00 - 0.08 ng/mL   Comment  3  Dg Chest 2 View  08/19/2015  CLINICAL DATA:  Stroke symptoms tonight. Fever. Hypertension. Ex-smoker. EXAM: CHEST  2 VIEW COMPARISON:  10/28/2013 FINDINGS: Mild cardiac enlargement without vascular congestion. Interstitial changes in the lung bases similar to previous study, likely fibrosis. No focal airspace disease. No blunting of costophrenic angles. No pneumothorax. Calcified and tortuous aorta. Surgical clips in the right upper quadrant. 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 Mild cardiac enlargement. No evidence of active pulmonary disease. Electronically Signed   By: Lucienne Capers M.D.   On: 08/19/2015 23:02   Ct Head Wo Contrast  08/19/2015  CLINICAL DATA:  Altered mental status.  No weakness.  Code stroke. EXAM: CT HEAD WITHOUT CONTRAST TECHNIQUE: Contiguous axial images were obtained from the base of the skull through the vertex without intravenous contrast. COMPARISON:  07/16/2013 FINDINGS: Diffuse cerebral atrophy. No ventricular dilatation. Low-attenuation changes in the deep white matter consistent with small vessel ischemia. Focal encephalomalacia in the right frontal lobe consistent with old infarct. No mass effect or midline shift. No abnormal extra-axial fluid collections. Gray-white matter junctions are distinct. Basal cisterns are not effaced. No evidence of acute intracranial hemorrhage. No depressed skull fractures. Mild mucosal thickening in the paranasal sinuses. Mastoid air cells are not opacified. Vascular calcifications. Calcification and deformity of the right globe consistent with old insult. Focal increased density in the left globe likely postoperative. IMPRESSION: No acute intracranial abnormalities. Chronic atrophy and small vessel ischemic changes. Old right frontal infarct. These results were called by telephone at the time of interpretation on 08/19/2015 at 8:38 pm to Dr. Leonel Ramsay, who verbally acknowledged these results. Electronically Signed   By:  Lucienne Capers M.D.   On: 08/19/2015 20:41   [name]  08/19/2015  CLINICAL DATA:  Initial valuation for acute altered mental status. EXAM: MRI HEAD WITHOUT CONTRAST TECHNIQUE: Multiplanar, multiecho pulse sequences of the brain and surrounding structures were obtained without intravenous contrast. COMPARISON:  Prior head CT from earlier the same day P FINDINGS: Diffusion-weighted sequences demonstrate a possible tiny 3 mm cortical infarct within the left temporal lobe (series 3, image 20). No associated edema or hemorrhage. No other infarct. Largely absent flow void within the diminutive right vertebral artery, stable. Major intracranial vascular flow voids are otherwise maintained. Diffuse prominence of the CSF containing spaces is compatible with generalized age-related cerebral atrophy. Mild chronic small vessel ischemic disease in within the periventricular white matter. Encephalomalacia within the high right frontal lobe consistent with remote ischemic infarct. There is associated gliosis within this region. Multiple additional remote lacunar infarcts within the right basal ganglia involving both the caudate and lentiform nuclei. No mass lesion, midline shift, or mass effect. No hydrocephalus. No extra-axial fluid collection. Pituitary gland normal. No acute abnormality about the globes. 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 Mild mucosal thickening within the ethmoidal air cells and maxillary sinuses. No mastoid effusion. Inner ear structures normal. Bone marrow signal intensity within normal limits. No scalp soft tissue abnormality. IMPRESSION: 1. 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 2. No other acute intracranial process identified. 3. Moderate to large remote infarct within the posterior right frontal lobe with extension into the operculum. Additional remote lacunar infarcts  involving the right basal ganglia. 4. Nonvisualization of the right vertebral artery, which may be occluded. This is stable relative to previous MRI from 2014. 5. Atrophy with mild chronic small vessel ischemic disease. Electronically Signed  By: Jeannine Boga M.D.   On: 08/19/2015 23:14   Dg Chest Portable 1 View  08/23/2015  CLINICAL DATA:  Initial evaluation for 2 episodes of syncope today, nausea, weakness. EXAM: PORTABLE CHEST 1 VIEW COMPARISON:  Prior radiograph from 08/19/2015. FINDINGS: Cardiomegaly is stable from prior. Mediastinal silhouette within normal limits. Tortuosity intrathoracic aorta with atheromatous plaque within the aortic arch noted, stable. Lungs are hypoinflated with bibasilar fibrotic changes, left greater than right Ing, stable. No focal infiltrates. No pulmonary edema or pleural effusion. No pneumothorax. No acute osseus abnormality. IMPRESSION: Stable appearance of the chest with left greater than right bibasilar fibrosis and mild cardiomegaly. No active cardiopulmonary disease. Electronically Signed   By: Jeannine Boga M.D.   On: 08/23/2015 22:39       I have personally reviewed and evaluated these images and lab results as part of my medical decision-making.   EKG Interpretation   Date/Time:  Tuesday August 23 2015 22:24:44 EST Ventricular Rate:  81 PR Interval:  158 QRS Duration: 102 QT Interval:  439 QTC Calculation: 510 R Axis:   0 Text Interpretation:  Sinus rhythm Atrial premature complex Prolonged QT  interval Confirmed by Ashok Cordia  MD, [name] ([id number]) on 08/23/2015 10:54:23 PM      MDM   Iv ns bolus. zofran iv.  Continuous pulse ox and monitor.   Labs. Ct.  Reviewed nursing notes and prior charts for additional history.   Awaiting ct, wound sutured, sterile dressing.  Tet im.   0005, no change from prior exam.  Pt alert, content appearing, and is awaiting CT.  ua also pending.  Signed out to Dr Claudine Mouton to check CTs when back,  check UA.  Giving syncope/weakess, will admit to hospitalists (just d/c from service earlier today).         Lajean Saver, MD 08/24/15 [phone]

## 2015-08-23 NOTE — ED Notes (Addendum)
Pt reports he keeps feeling like he is going to pass out. Pt had syncopal episode at home and and once in ER. Pt with lac to top of head. Pt wife sts that he called her into bathroom tonight after shower and was asking her to dry off his back and that his back was hurting. Pt then had syncopal episode.

## 2015-08-23 NOTE — Progress Notes (Signed)
Physical Therapy Treatment Patient Details Name: Brett Castaneda MRN: [id number] DOB: Jan 09, 1941 Today's Date: 08/23/2015    History of Present Illness Pt is a 74 y/o M admitted w/ increased confusion and difficulty w/ word finding.  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  Pt's PMH includes stroke, rheumatoid arthritis, ALS blind Rt eye.    PT Comments    Patient mobilizing close to baseline, but fall risk remains due to LE neuropathy, decreased vision and poor foot clearance.  Feel HHPT will assist for safety and to maximize balance and LE strength with HEP.  Follow Up Recommendations  Home health PT;Supervision for mobility/OOB (safety eval and balance HEP)     Equipment Recommendations  None recommended by PT    Recommendations for Other Services       Precautions / Restrictions Precautions Precautions: Fall Precaution Comments: Blind in Rt eye.  pt reports 1 fall in the past year 2/2 walking into a small ditch Restrictions Weight Bearing Restrictions: No    Mobility  Bed Mobility Overal bed mobility: Modified Independent       Supine to sit: Modified independent (Device/Increase time) Sit to supine: Modified independent (Device/Increase time)      Transfers   Equipment used: Rolling walker (2 wheeled)   Sit to Stand: Supervision         General transfer comment: stood from bed supervision for safety and in bathroom assist to low seat cues for use of rail, but pt stood unaided from toilet  Ambulation/Gait Ambulation/Gait assistance: Supervision;Min guard Ambulation Distance (Feet): 400 Feet Assistive device: Rolling walker (2 wheeled);None Gait Pattern/deviations: Step-through pattern;Decreased stride length;Shuffle;Decreased dorsiflexion - left;Decreased dorsiflexion - right     General Gait Details: able to walk in room with RW and open door as well as negotiate in small space around bathroom door with supervision and  some limited safety awareness.  Walked some without walker and minguard for safety due to decreased speed and step length with more shuffling pattern,  Educated both pt and spouse safest to use walker for longer distances for energy conservation and Radio broadcast assistant    Modified Rankin (Stroke Patients Only) Modified Rankin (Stroke Patients Only) Pre-Morbid Rankin Score: Moderate disability Modified Rankin: Moderately severe disability     Balance             Standing balance-Leahy Scale: Fair                      Cognition Arousal/Alertness: Awake/alert Behavior During Therapy: WFL for tasks assessed/performed Overall Cognitive Status: History of cognitive impairments - at baseline                      Exercises      General Comments        Pertinent Vitals/Pain Pain Assessment: No/denies pain    Home Living                      Prior Function            PT Goals (current goals can now be found in the care plan section) Progress towards PT goals: Progressing toward goals    Frequency  Min 3X/week    PT Plan Current plan remains appropriate    Co-evaluation             End of  Session Equipment Utilized During Treatment: Gait belt Activity Tolerance: Patient tolerated treatment well Patient left: in bed;with call bell/phone within reach;with bed alarm set;with family/visitor present     Time: 0935-1000 PT Time Calculation (min) (ACUTE ONLY): 25 min  Charges:  $Gait Training: 23-37 mins                    G Codes:      Brett Castaneda,Brett Castaneda 01-Sep-2015, 10:32 AM  Magda Kiel, [place] Sep 01, 2015

## 2015-08-24 ENCOUNTER — Encounter (HOSPITAL_COMMUNITY): Payer: Self-pay | Admitting: Internal Medicine

## 2015-08-24 ENCOUNTER — Observation Stay (HOSPITAL_COMMUNITY): Payer: Medicare Other

## 2015-08-24 DIAGNOSIS — Z7982 Long term (current) use of aspirin: Secondary | ICD-10-CM | POA: Diagnosis not present

## 2015-08-24 DIAGNOSIS — I69351 Hemiplegia and hemiparesis following cerebral infarction affecting right dominant side: Secondary | ICD-10-CM | POA: Diagnosis not present

## 2015-08-24 DIAGNOSIS — R079 Chest pain, unspecified: Secondary | ICD-10-CM | POA: Diagnosis not present

## 2015-08-24 DIAGNOSIS — W1830XA Fall on same level, unspecified, initial encounter: Secondary | ICD-10-CM | POA: Diagnosis present

## 2015-08-24 DIAGNOSIS — M069 Rheumatoid arthritis, unspecified: Secondary | ICD-10-CM | POA: Diagnosis present

## 2015-08-24 DIAGNOSIS — Z7983 Long term (current) use of bisphosphonates: Secondary | ICD-10-CM | POA: Diagnosis not present

## 2015-08-24 DIAGNOSIS — H5441 Blindness, right eye, normal vision left eye: Secondary | ICD-10-CM | POA: Diagnosis present

## 2015-08-24 DIAGNOSIS — E785 Hyperlipidemia, unspecified: Secondary | ICD-10-CM | POA: Diagnosis present

## 2015-08-24 DIAGNOSIS — S0101XA Laceration without foreign body of scalp, initial encounter: Secondary | ICD-10-CM | POA: Diagnosis present

## 2015-08-24 DIAGNOSIS — Z7902 Long term (current) use of antithrombotics/antiplatelets: Secondary | ICD-10-CM | POA: Diagnosis not present

## 2015-08-24 DIAGNOSIS — Z79899 Other long term (current) drug therapy: Secondary | ICD-10-CM | POA: Diagnosis not present

## 2015-08-24 DIAGNOSIS — Y93E1 Activity, personal bathing and showering: Secondary | ICD-10-CM | POA: Diagnosis not present

## 2015-08-24 DIAGNOSIS — N179 Acute kidney failure, unspecified: Secondary | ICD-10-CM | POA: Diagnosis present

## 2015-08-24 DIAGNOSIS — G1221 Amyotrophic lateral sclerosis: Secondary | ICD-10-CM | POA: Diagnosis not present

## 2015-08-24 DIAGNOSIS — I639 Cerebral infarction, unspecified: Secondary | ICD-10-CM | POA: Diagnosis not present

## 2015-08-24 DIAGNOSIS — Z66 Do not resuscitate: Secondary | ICD-10-CM | POA: Diagnosis present

## 2015-08-24 DIAGNOSIS — Z96653 Presence of artificial knee joint, bilateral: Secondary | ICD-10-CM | POA: Diagnosis present

## 2015-08-24 DIAGNOSIS — Z791 Long term (current) use of non-steroidal anti-inflammatories (NSAID): Secondary | ICD-10-CM | POA: Diagnosis not present

## 2015-08-24 DIAGNOSIS — Z85828 Personal history of other malignant neoplasm of skin: Secondary | ICD-10-CM | POA: Diagnosis not present

## 2015-08-24 DIAGNOSIS — R55 Syncope and collapse: Secondary | ICD-10-CM | POA: Diagnosis not present

## 2015-08-24 DIAGNOSIS — R42 Dizziness and giddiness: Secondary | ICD-10-CM | POA: Diagnosis not present

## 2015-08-24 DIAGNOSIS — Z87891 Personal history of nicotine dependence: Secondary | ICD-10-CM | POA: Diagnosis not present

## 2015-08-24 DIAGNOSIS — I10 Essential (primary) hypertension: Secondary | ICD-10-CM | POA: Diagnosis present

## 2015-08-24 DIAGNOSIS — Y92002 Bathroom of unspecified non-institutional (private) residence single-family (private) house as the place of occurrence of the external cause: Secondary | ICD-10-CM | POA: Diagnosis not present

## 2015-08-24 DIAGNOSIS — E860 Dehydration: Secondary | ICD-10-CM | POA: Diagnosis present

## 2015-08-24 DIAGNOSIS — I4581 Long QT syndrome: Secondary | ICD-10-CM | POA: Diagnosis present

## 2015-08-24 LAB — CBC WITH DIFFERENTIAL/PLATELET
BASOS ABS: 0 10*3/uL (ref 0.0–0.1)
BASOS PCT: 0 %
EOS ABS: 0.1 10*3/uL (ref 0.0–0.7)
Eosinophils Relative: 1 %
HCT: 32.8 % — ABNORMAL LOW (ref 39.0–52.0)
HEMOGLOBIN: 11 g/dL — AB (ref 13.0–17.0)
Lymphocytes Relative: 22 %
Lymphs Abs: 1.4 10*3/uL (ref 0.7–4.0)
MCH: 31 pg (ref 26.0–34.0)
MCHC: 33.5 g/dL (ref 30.0–36.0)
MCV: 92.4 fL (ref 78.0–100.0)
MONO ABS: 0.3 10*3/uL (ref 0.1–1.0)
MONOS PCT: 4 %
NEUTROS PCT: 73 %
Neutro Abs: 4.9 10*3/uL (ref 1.7–7.7)
Platelets: 212 10*3/uL (ref 150–400)
RBC: 3.55 MIL/uL — ABNORMAL LOW (ref 4.22–5.81)
RDW: 13.6 % (ref 11.5–15.5)
WBC: 6.7 10*3/uL (ref 4.0–10.5)

## 2015-08-24 LAB — URINE MICROSCOPIC-ADD ON: WBC, UA: NONE SEEN WBC/hpf (ref 0–5)

## 2015-08-24 LAB — URINALYSIS, ROUTINE W REFLEX MICROSCOPIC
BILIRUBIN URINE: NEGATIVE
Glucose, UA: NEGATIVE mg/dL
KETONES UR: NEGATIVE mg/dL
LEUKOCYTES UA: NEGATIVE
NITRITE: NEGATIVE
PROTEIN: NEGATIVE mg/dL
Specific Gravity, Urine: 1.037 — ABNORMAL HIGH (ref 1.005–1.030)
pH: 6.5 (ref 5.0–8.0)

## 2015-08-24 LAB — CULTURE, BLOOD (ROUTINE X 2)
CULTURE: NO GROWTH
CULTURE: NO GROWTH

## 2015-08-24 LAB — COMPREHENSIVE METABOLIC PANEL
ALBUMIN: 2.7 g/dL — AB (ref 3.5–5.0)
ALK PHOS: 43 U/L (ref 38–126)
ALT: 74 U/L — AB (ref 17–63)
ANION GAP: 6 (ref 5–15)
AST: 84 U/L — ABNORMAL HIGH (ref 15–41)
BILIRUBIN TOTAL: 0.7 mg/dL (ref 0.3–1.2)
BUN: 12 mg/dL (ref 6–20)
CALCIUM: 7.9 mg/dL — AB (ref 8.9–10.3)
CO2: 22 mmol/L (ref 22–32)
CREATININE: 1.13 mg/dL (ref 0.61–1.24)
Chloride: 107 mmol/L (ref 101–111)
Glucose, Bld: 109 mg/dL — ABNORMAL HIGH (ref 65–99)
Potassium: 4.2 mmol/L (ref 3.5–5.1)
Sodium: 135 mmol/L (ref 135–145)
TOTAL PROTEIN: 6.1 g/dL — AB (ref 6.5–8.1)

## 2015-08-24 LAB — TROPONIN I
Troponin I: <0.03 ng/mL (ref <0.031)
Troponin I: <0.03 ng/mL (ref <0.031)

## 2015-08-24 LAB — MAGNESIUM: MAGNESIUM: 1.8 mg/dL (ref 1.7–2.4)

## 2015-08-24 MED ORDER — METHOTREXATE 2.5 MG PO TABS
10.0000 mg | ORAL_TABLET | ORAL | Status: DC
  Administered 2015-08-24: 10 mg via ORAL
  Filled 2015-08-24: qty 4

## 2015-08-24 MED ORDER — SIMVASTATIN 40 MG PO TABS
40.0000 mg | ORAL_TABLET | Freq: Every day | ORAL | Status: DC
  Administered 2015-08-24: 40 mg via ORAL
  Filled 2015-08-24: qty 1

## 2015-08-24 MED ORDER — POLYETHYLENE GLYCOL 3350 17 G PO PACK: 17.0000 g | PACK | Freq: Every day | ORAL | Status: DC | PRN

## 2015-08-24 MED ORDER — SODIUM CHLORIDE 0.9 % IV BOLUS (SEPSIS)
1000.0000 mL | Freq: Once | INTRAVENOUS | Status: AC
  Administered 2015-08-24: 1000 mL via INTRAVENOUS

## 2015-08-24 MED ORDER — SODIUM CHLORIDE 0.9 % IV SOLN: INTRAVENOUS | Status: DC

## 2015-08-24 MED ORDER — ENOXAPARIN SODIUM 40 MG/0.4ML ~~LOC~~ SOLN
40.0000 mg | SUBCUTANEOUS | Status: DC
  Administered 2015-08-24 – 2015-08-25 (×2): 40 mg via SUBCUTANEOUS
  Filled 2015-08-24 (×2): qty 0.4

## 2015-08-24 MED ORDER — TAMSULOSIN HCL 0.4 MG PO CAPS
0.4000 mg | ORAL_CAPSULE | Freq: Every day | ORAL | Status: DC
  Administered 2015-08-24 – 2015-08-25 (×2): 0.4 mg via ORAL
  Filled 2015-08-24 (×2): qty 1

## 2015-08-24 MED ORDER — LATANOPROST 0.005 % OP SOLN
1.0000 [drp] | Freq: Every day | OPHTHALMIC | Status: DC
  Administered 2015-08-24: 1 [drp] via OPHTHALMIC
  Filled 2015-08-24 (×2): qty 2.5

## 2015-08-24 MED ORDER — ONDANSETRON HCL 4 MG/2ML IJ SOLN: 4.0000 mg | Freq: Four times a day (QID) | INTRAMUSCULAR | Status: DC | PRN

## 2015-08-24 MED ORDER — FA-PYRIDOXINE-CYANOCOBALAMIN 2.5-25-2 MG PO TABS
1.0000 | ORAL_TABLET | ORAL | Status: DC
  Administered 2015-08-24: 1 via ORAL
  Filled 2015-08-24: qty 1

## 2015-08-24 MED ORDER — TIMOLOL MALEATE 0.5 % OP SOLN
1.0000 [drp] | Freq: Every evening | OPHTHALMIC | Status: DC
  Administered 2015-08-24: 1 [drp] via OPHTHALMIC
  Filled 2015-08-24: qty 5

## 2015-08-24 MED ORDER — ONDANSETRON HCL 4 MG PO TABS: 4.0000 mg | ORAL_TABLET | Freq: Four times a day (QID) | ORAL | Status: DC | PRN

## 2015-08-24 MED ORDER — ASPIRIN 325 MG PO TABS
325.0000 mg | ORAL_TABLET | Freq: Every day | ORAL | Status: DC
  Administered 2015-08-24 – 2015-08-25 (×2): 325 mg via ORAL
  Filled 2015-08-24 (×2): qty 1

## 2015-08-24 MED ORDER — ACETAMINOPHEN 325 MG PO TABS
650.0000 mg | ORAL_TABLET | ORAL | Status: DC | PRN
  Administered 2015-08-25: 650 mg via ORAL
  Filled 2015-08-24: qty 2

## 2015-08-24 NOTE — Procedures (Signed)
ELECTROENCEPHALOGRAM REPORT   Patient: Brett Castaneda      Room #: [id number] Age: 74 y.o.        Sex: male Referring Physician: Dr Cruzita Lederer [facility] Report Date:  08/24/2015        Interpreting Physician: Hulen Luster  History: Brett Castaneda is an 74 y.o. male admitted with syncopal episode  Medications:  I have reviewed the patient's current medications.  Conditions of Recording:  This is a 16 channel EEG carried out with the patient in the drowsy state.  Description:  The waking background activity consists of a low voltage, symmetrical, fairly well organized, theta activity, seen from the parieto-occipital and posterior temporal regions. Brief episodes of posterior alpha activity noted with arousal.  No focal slowing or epileptiform activity noted.     Hyperventilation was not performed. Intermittent photic stimulation was not performed.   IMPRESSION: Normal drowsy electroencephalogram. There are no focal lateralizing or epileptiform features.   Jim Like, DO [facility] [phone]  If 7pm- 7am, please page neurology on call as listed in [place]. 08/24/2015, 2:33 PM

## 2015-08-24 NOTE — Progress Notes (Signed)
EEG Completed; Results Pending  

## 2015-08-24 NOTE — Consult Note (Addendum)
ELECTROPHYSIOLOGY CONSULT NOTE    Patient ID: Brett Castaneda MRN: [id number], DOB/AGE: Nov 05, 1940 74 y.o.  Admit date: 08/23/2015 Date of Consult: 08/24/2015   Primary Physician: Nyoka Cowden, MD Primary Cardiologist: Remotely saw Dr. Debara Pickett, he denies seeing cardiology in hears  Reason for Consultation: syncope  HPI: Brett Castaneda is a 74 y.o. male with PMHx of CVA (2008, 2014, 08/19/15), ALS, PVD/right CEA 2008, HTN, HLD, RA discharged from [facility] yesterday s/p acute CVA with plans for a 30day event monitor with no clear etiology of his CVA.  The patient had a syncopal event at home yesterday, occurred after he had bathed, was drying off, states he got a pain that ran down his arm, became weak and fainted, he states struck a metal piece on the bottom of the door with laceration. He did not have any preceeding symptoms of palpitations, no CP at the time, he does not have good recollection of the event, chart states regained consciousness after about 4-94minutes, there were no reported seizure-like activity or incontinence, this witnessed by his wife who is not at bedside at this time,and is gained from his chart.  the patient denies any new defecits though, denies any particular weakness from his recent stroke.   He was brought to the ER, where he again had a brief episode of LOC associated with N/V followed with CP. CT head did not show anything acute. CT angiogram of the chest and abdomen was negative for any dissection. He denies any ongoing nausea or further vomiting, he remarks he thinks he was weak because he hadn't been eating/drinking well in the hospital the preceeding days.  He has not had any further CP, denies any palpitations at any time.  The patient feels well now, states that earlier today he felt dizzy, like things were moving or "Swimming", not like how he felt at home.  Though feels well now.    Past Medical History  Diagnosis Date  . BASAL CELL CARCINOMA, FACE  03/31/2009  . 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 CONSTIPATION 06/22/2008  . DIVERTICULOSIS, COLON 03/24/2007  . GERD 03/24/2007  . HYPERLIPIDEMIA 08/27/2007  . HYPERTENSION 12/14/2008  . Rheumatoid arthritis(714.0) 03/24/2007  . Blind right eye   . Retinal detachment     hx of  . Keratosis Oct. 2013  . Fall at home Nov. 5, 2014  06-28-13    in the home and Outside as well  . 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 Stroke [facility]) Oct 2014  . 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 Total knee arthroplasty      bital  . Carpal tunnel release Right 2003  . Cataract extraction  1980    removed cataract in left eye. Currently blind in right eye  . Cholecystectomy  2001    Gall Bladder  . Retinal detachment surgery  2004  . Lumbar puncture  Oct. 21, 2014  . Nerve conduction    . Mri  07-14-13  . Carotid endarterectomy Right 08-15-07    cea  . Joint replacement  2000    Right knee replacement  . Eye surgery  [id number]    retinal detachment  . Eye surgery      left cataract surgery  . Shoulder surgery  Left 2009    hx "frozen" shoulder     Prescriptions prior to admission  Medication Sig Dispense Refill Last Dose  . acetaminophen (TYLENOL) 500 MG tablet Take 500 mg by mouth every 6 (six) hours as needed for mild pain, moderate pain or fever.    08/23/2015 at Unknown time  . alendronate (FOSAMAX) 70 MG tablet Take 70 mg by mouth once a week. Take with a full glass of water on an empty stomach. Takes on Wednesday   08/17/2015  . aspirin 325 MG tablet Take 325 mg by mouth daily.   08/23/2015 at Unknown time  . folic acid-pyridoxine-cyancobalamin (VIRT-VITE FORTE) 2.5-25-2 MG TABS Take 1 tablet by mouth once a week. Takes on days he takes methotrexate. (Saturdays)   08/13/2015  .  latanoprost (XALATAN) 0.005 % ophthalmic solution Place 1 drop into the left eye at bedtime.    08/23/2015 at Unknown time  . methotrexate (RHEUMATREX) 2.5 MG tablet Take 10 mg by mouth once a week. Caution:Chemotherapy. Protect from light.   08/13/2015  . Multiple Vitamins-Minerals (OCUVITE ADULT 50+) CAPS Take 1 capsule by mouth daily.   08/23/2015 at Unknown time  . polyethylene glycol (MIRALAX / GLYCOLAX) packet Take 17 g by mouth daily as needed (for constipation).   2 weeks  . Probiotic Product (PROBIOTIC DAILY PO) Take 1 tablet by mouth daily.   08/23/2015 at Unknown time  . sodium chloride (OCEAN) 0.65 % SOLN nasal spray Place 1 spray into both nostrils 3 (three) times daily.   08/23/2015 at Unknown time  . tamsulosin (FLOMAX) 0.4 MG CAPS capsule Take 0.4 mg by mouth daily.  0 08/23/2015 at Unknown time  . timolol (TIMOPTIC) 0.5 % ophthalmic solution Place 1 drop into the left eye every evening.    08/23/2015 at Unknown time  . aspirin EC 81 MG EC tablet Take 1 tablet (81 mg total) by mouth daily. (Patient not taking: Reported on 08/23/2015) 30 tablet 0 Not Taking at Unknown time  . clopidogrel (PLAVIX) 75 MG tablet Take 1 tablet (75 mg total) by mouth daily. (Patient not taking: Reported on 08/23/2015) 90 tablet 4 Not Taking at Unknown time    Inpatient Medications:  . aspirin  325 mg Oral Daily  . enoxaparin (LOVENOX) injection  40 mg Subcutaneous Q24H  . folic acid-pyridoxine-cyancobalamin  1 tablet Oral Weekly  . latanoprost  1 drop Left Eye QHS  . methotrexate  10 mg Oral Weekly  . simvastatin  40 mg Oral q1800  . tamsulosin  0.4 mg Oral Daily  . timolol  1 drop Left Eye QPM    Allergies:  Allergies  Allergen Reactions  . Dopamine Anaphylaxis  . Morphine And Related Other (See Comments)    Hallucinations, very bad reactions  . Vicodin [Hydrocodone-Acetaminophen] Nausea And Vomiting    Social History   Social History  . Marital Status: Married    Spouse Name: [name]    . Number of Children: 4  . Years of Education: N/A   Occupational History  . retired    Social History Main Topics  . Smoking status: Former Smoker -- 23 years    Types: Cigarettes    Quit date: 09/24/1980  . Smokeless tobacco: Never Used  . Alcohol Use: No  . Drug Use: No  . Sexual Activity: Not on file   Other Topics Concern  . Not on file   Social History Narrative   Lives with wife of 27 years.  They have four children.  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      Family History  Problem Relation Age of Onset  . Stroke Mother 25  . Hypertension Mother   . Heart disease Father   . Aneurysm Father   . Coronary artery disease Sister   . Pneumonia Sister   . Heart disease Sister   . Diabetes Sister   . Hypertension Sister   . Colon cancer Neg Hx      Review of Systems: All other systems reviewed and are otherwise negative except as noted above.  Physical Exam: Filed Vitals:   08/24/15 0100 08/24/15 0200 08/24/15 0547 08/24/15 0942  BP: 142/87 144/74 137/82 146/76  Pulse: 81 79 72 83  Temp:  98.5 F (36.9 C) 98.5 F (36.9 C) 98.1 F (36.7 C)  TempSrc:  Oral Oral Oral  Resp: 17 18 17 17   Height:  5\' 11"  (1.803 m)    Weight:  167 lb 8 oz (75.978 kg)    SpO2: 96% 97% 99% 100%    GEN- The patient is thin, well appearing, alert and oriented x 3 today.   HEENT: normocephalic, repaired laceration to top /right side of head  conjunctiva pink; hearing intact; oropharynx clear; neck supple, no JVP Lymph- no cervical lymphadenopathy Lungs- Clear to ausculation bilaterally, normal work of breathing.  No wheezes, rales, rhonchi Heart- Regular rate and rhythm, no murmurs, rubs or gallops, PMI not laterally displaced GI- soft, non-tender, non-distended Extremities- no clubbing, cyanosis, or edema; DP/PT/radial pulses 2+ bilaterally MS- no significant deformity or atrophy Skin- warm and dry, no rash or lesion Psych- euthymic mood,  full affect Neuro- no gross deficits observed, blind R eye secondary to remote injury  Labs:   Lab Results  Component Value Date   WBC 6.7 08/24/2015   HGB 11.0* 08/24/2015   HCT 32.8* 08/24/2015   MCV 92.4 08/24/2015   PLT 212 08/24/2015    Recent Labs Lab 08/24/15 0427  NA 135  K 4.2  CL 107  CO2 22  BUN 12  CREATININE 1.13  CALCIUM 7.9*  PROT 6.1*  BILITOT 0.7  ALKPHOS 43  ALT 74*  AST 84*  GLUCOSE 109*      Radiology/Studies:  Ct Head Wo Contrast 10/23/2014  CLINICAL DATA:  74 year old male with syncope. EXAM: CT HEAD WITHOUT CONTRAST TECHNIQUE: Contiguous axial images were obtained from the base of the skull through the vertex without intravenous contrast. COMPARISON:  Brain CT and MRI dated 08/19/2015 FINDINGS: The ventricles are dilated and the sulci are prominent compatible with age-related atrophy. Periventricular and deep white matter hypodensities represent chronic microvascular ischemic changes. Stable right frontal lobe old infarct and encephalomalacia. There is no intracranial hemorrhage. No mass effect or midline shift identified. The visualized paranasal sinuses and mastoid air cells are well aerated. The calvarium is intact. There is a stable calcific changes of the right globe. IMPRESSION: No acute intracranial pathology. Age-related atrophy and chronic microvascular ischemic disease. Stable right frontal lobe old infarct and encephalomalacia. If symptoms persist and there are no contraindications, MRI may provide better evaluation if clinically indicated. Electronically Signed   By: Anner Crete M.D.   On: 08/24/2015 00:29    Dg Chest Portable 1 View 08/23/2015  CLINICAL DATA:  Initial evaluation for 2 episodes of syncope today, nausea, weakness. EXAM: PORTABLE CHEST 1 VIEW COMPARISON:  Prior radiograph from 08/19/2015. FINDINGS: Cardiomegaly is stable from prior. Mediastinal silhouette within normal limits. Tortuosity intrathoracic aorta with atheromatous  plaque within the aortic arch noted, stable. Lungs are hypoinflated with bibasilar fibrotic changes, left greater than right Ing, stable. No focal infiltrates. No pulmonary edema or pleural effusion. No pneumothorax. No acute osseus abnormality. IMPRESSION: Stable appearance of the chest with left greater than right bibasilar fibrosis and mild cardiomegaly. No active cardiopulmonary disease. Electronically Signed   By: Jeannine Boga M.D.   On: 08/23/2015 22:39   Ct Angio Chest Aorta W/cm &/or Wo/cm 08/24/2015  CLINICAL DATA:  [place] today after becoming lightheaded. Was discharged from the hospital earlier today. EXAM: CT ANGIOGRAPHY CHEST, ABDOMEN AND PELVIS TECHNIQUE: Multidetector CT imaging through the chest, abdomen and pelvis was performed using the standard protocol during bolus administration of intravenous contrast. Multiplanar reconstructed images and MIPs were obtained and reviewed to evaluate the vascular anatomy. CONTRAST:  116mL OMNIPAQUE IOHEXOL 350 MG/ML SOLN COMPARISON:  07/23/2013 FINDINGS: CTA CHEST FINDINGS The thoracic aorta is normal in caliber. There is extensive atherosclerotic plaque. There is no dissection. Proximal great vessels are patent. There is good opacification of the pulmonary arteries with no evidence of pulmonary embolism. Review of the MIP images confirms the above findings. Nonvascular chest findings include multiple small thyroid nodules, multiple nonspecific nodes in the mediastinum and both hila, and fibrotic honeycombing in the basilar lung periphery. Central airways are patent. No pleural effusions or pericardial effusions. CTA ABDOMEN AND PELVIS FINDINGS The upper abdominal aorta is normal in caliber with extensive atherosclerotic plaque. There is mild fusiform dilatation of the infrarenal abdominal aorta to a maximum AP diameter 3.4 cm. There is mild stenosis of the celiac axis 1 cm from its origin. Superior mesenteric artery and inferior mesenteric artery are  widely patent. Each kidney is served by a widely patent single main renal artery. The common iliac and external iliac arteries are patent bilaterally. No dissection of the abdominal aorta. Review of the MIP images confirms the above findings. Nonvascular findings include extensive colonic diverticulosis without evidence of diverticulitis or other acute inflammatory process. There are unremarkable arterial phase appearances of the liver, spleen, pancreas, adrenals and kidneys. There is prior cholecystectomy. Bile ducts are unremarkable. Moderate prostatic enlargement. No significant skeletal lesions. IMPRESSION: 1. Extensive atherosclerotic plaque throughout the thoracic and abdominal aorta. There is no dissection. There is a 3.4 cm infrarenal abdominal aortic aneurysm. 2. Negative for acute pulmonary embolism. 3. Multiple tiny thyroid nodules. 4. Diverticulosis. Electronically Signed   By: Andreas Newport M.D.   On: 08/24/2015 00:34   Ct Angio Abd/pel W/ And/or W/o 08/24/2015  CLINICAL DATA:  [place] today after becoming lightheaded. Was discharged from the hospital earlier today. EXAM: CT ANGIOGRAPHY CHEST, ABDOMEN AND PELVIS TECHNIQUE: Multidetector CT imaging through the chest, abdomen and pelvis was performed using the standard protocol during bolus administration of intravenous contrast. Multiplanar reconstructed images and MIPs were obtained and reviewed to evaluate the vascular anatomy. CONTRAST:  130mL OMNIPAQUE IOHEXOL 350 MG/ML SOLN COMPARISON:  07/23/2013 FINDINGS: CTA CHEST FINDINGS The thoracic aorta is normal in caliber. There is extensive atherosclerotic plaque. There is no dissection. Proximal great vessels are patent. There is good opacification of the pulmonary arteries with no evidence of pulmonary embolism. Review of the MIP images confirms the above findings. Nonvascular chest findings include multiple small thyroid nodules, multiple nonspecific nodes in the mediastinum and both hila, and  fibrotic honeycombing in the basilar lung periphery. Central airways are patent. No pleural effusions or pericardial effusions. CTA ABDOMEN AND PELVIS FINDINGS The upper abdominal aorta is normal in caliber with extensive  atherosclerotic plaque. There is mild fusiform dilatation of the infrarenal abdominal aorta to a maximum AP diameter 3.4 cm. There is mild stenosis of the celiac axis 1 cm from its origin. Superior mesenteric artery and inferior mesenteric artery are widely patent. Each kidney is served by a widely patent single main renal artery. The common iliac and external iliac arteries are patent bilaterally. No dissection of the abdominal aorta. Review of the MIP images confirms the above findings. Nonvascular findings include extensive colonic diverticulosis without evidence of diverticulitis or other acute inflammatory process. There are unremarkable arterial phase appearances of the liver, spleen, pancreas, adrenals and kidneys. There is prior cholecystectomy. Bile ducts are unremarkable. Moderate prostatic enlargement. No significant skeletal lesions. IMPRESSION: 1. Extensive atherosclerotic plaque throughout the thoracic and abdominal aorta. There is no dissection. There is a 3.4 cm infrarenal abdominal aortic aneurysm. 2. Negative for acute pulmonary embolism. 3. Multiple tiny thyroid nodules. 4. Diverticulosis. Electronically Signed   By: Andreas Newport M.D.   On: 08/24/2015 00:34   [name] 08/19/2015  1. 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  2. No other acute intracranial process identified.  3. Moderate to large remote infarct within the posterior right frontal lobe with extension into the operculum. Additional remote lacunar infarcts involving the right basal ganglia.  4. Nonvisualization of the right vertebral artery, which may be occluded. This is stable relative to previous MRI from 2014.  5. Atrophy with mild chronic small  vessel ischemic disease.   08/20/15: Echocardiogram Study Conclusions - Left ventricle: The cavity size was normal. There was focal basal hypertrophy. Systolic function was normal. The estimated ejection fraction was in the range of 60% to 65%. Diastolic function is indeterminate. Wall motion was normal; there were no regional wall motion abnormalities. - Aortic valve: Mildly calcified annulus. Trileaflet; mildly thickened leaflets. Valve area (VTI): 2.97 cm^2. Valve area (Vmax): 3.12 cm^2. - Mitral valve: There was mild regurgitation. - Left atrium: The atrium was severely dilated. (80mm) - Systemic veins: IVC is small, suggesting low RA pressures and hypovolemia. - Technically adequate study.  From neuro note dated 08/23/15: Carotid Doppler  Right: CEA is patent. Left: intimal wall thickening CCA. Moderate protruding calcific plaque with acoustic shadowing ECA origin. Mild to moderate mixed plaque origin and proximal ICA. Bilateral: 1-39% ICA plaquing. Vertebral artery flow is antegrade.  EKG: SR, APC, no acute looking changes TELEMETRY: SR  Assessment and Plan:  1. Syncope            No clear pre-syncopal symptoms other then an unusual pain in his back           QT appears OK           He reports at this time a sensation of motion with quick movements only , not near syncope or "faint"           Reportedly had intermittent episodes in ED though no telemetry or BP data is note particularly with these episodes  2. Chest pain - not ongoing            patient had a brief episode of chest pain in the ER. Patient states it was pressure-like. Prior to coming to the ER patient also had upper back pain with negative CT chest           No ischemic looking EKG changes           2 Trop I are negative  3. Recent stroke 08/19/15           on aspirin. On statins.  MR demonstrates multiple prior strokes  4. History of hypertension            presently not on any  antihypertensives s/p CVA, monitor.  5. History of rheumatoid arthritis on methotrexate.  PLAN: See Dr. Tanna Furry note below.  SignedTommye Standard, PA-C 08/24/2015 10:12 AM  EP Attending  Patient seen and examined. Agree with the findings as noted above. He has had recurrent syncope and has had unexplained seizures. I am not convinced of his diagnosis of ALS. He has had both recurrent strokes and unexplained syncope. I would recommend we place an ILR rather than a 30 day monitor. Will schedule tomorrow. He could go home after if no other issues.   Mikle Bosworth.D.

## 2015-08-24 NOTE — H&P (Signed)
[facility] History and Physical  QADIR KAPPLE [id number] DOB: 1941/01/03 DOA: 08/23/2015  Referring physician: Dr.Steinl. PCP: Nyoka Cowden, MD  Specialists: None.  Chief Complaint: Loss of consciousness.  HPI: ROQUE RECALDE is a 74 y.o. male with history of rheumatoid arthritis, hypertension who was recently admitted for stroke and discharged yesterday home was brought to the ER after patient had syncopal episode. Patient was taking bath at his house and while drying patient started experiencing upper back pain following which patient fell to the floor in his bathroom. This was witnessed by patient's wife who was by side. Patient did not have any palpitations or incontinence of urine or tongue bite or any seizure-like episode as per the patient's wife witnessed the fall. Patient lacerated his scalp with the fall. Patient regained consciousness after 4-5 minutes. Patient was brought to the ER in patients cough. While waiting in the ER patient had another brief episode of syncope and one episode of nausea vomiting. Denies any abdominal pain. Patient has mild right-sided weakness from previous stroke otherwise patient did not have any new focal deficits. In the ER patient also had brief episode of chest pain which was pressure-like and resolved without any intervention. CT head did not show anything acute. CT angiogram of the chest and abdomen was negative for any dissection. Labs reveal mildly elevated creatinine. Patient's scalp laceration was sutured. EKG shows prolonged QT interval. Patient has been admitted for syncope chest pain and acute renal failure. On my exam patient presently is asymptomatic and follows commands.   Review of Systems: As presented in the history of presenting illness, rest negative.  Past Medical History  Diagnosis Date  . BASAL CELL CARCINOMA, FACE 03/31/2009  . 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 CONSTIPATION  06/22/2008  . DIVERTICULOSIS, COLON 03/24/2007  . GERD 03/24/2007  . HYPERLIPIDEMIA 08/27/2007  . HYPERTENSION 12/14/2008  . Rheumatoid arthritis(714.0) 03/24/2007  . Blind right eye   . Retinal detachment     hx of  . Keratosis Oct. 2013  . Fall at home Nov. 5, 2014  06-28-13    in the home and Outside as well  . 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 Stroke [facility]) Oct 2014  . 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 Total knee arthroplasty      bital  . Carpal tunnel release Right 2003  . Cataract extraction  1980    removed cataract in left eye. Currently blind in right eye  . Cholecystectomy  2001    Gall Bladder  . Retinal detachment surgery  2004  . Lumbar puncture  Oct. 21, 2014  . Nerve conduction    . Mri  07-14-13  . Carotid endarterectomy Right 08-15-07    cea  . Joint replacement  2000    Right knee replacement  . Eye surgery  [id number]    retinal detachment  . Eye surgery      left cataract surgery  . Shoulder surgery Left 2009    hx "frozen" shoulder   Social History:  reports that he quit smoking about 34 years ago. His smoking use included Cigarettes. He quit after 23 years of use. He has never used smokeless tobacco. He reports that he does not drink alcohol or use illicit drugs. Where does patient  live home. Can patient participate in ADLs? Yes.  Allergies  Allergen Reactions  . Dopamine Anaphylaxis  . Morphine And Related Other (See Comments)    Hallucinations, very bad reactions  . Vicodin [Hydrocodone-Acetaminophen] Nausea And Vomiting    Family History:  Family History  Problem Relation Age of Onset  . Stroke Mother 12  . Hypertension Mother   . Heart disease Father   . Aneurysm Father   . Coronary artery disease Sister   . Pneumonia Sister   . Heart disease Sister   . Diabetes Sister   .  Hypertension Sister   . Colon cancer Neg Hx       Prior to Admission medications   Medication Sig Start Date End Date Taking? Authorizing Provider  acetaminophen (TYLENOL) 500 MG tablet Take 500 mg by mouth every 6 (six) hours as needed for mild pain, moderate pain or fever.    Yes Historical Provider, MD  alendronate (FOSAMAX) 70 MG tablet Take 70 mg by mouth once a week. Take with a full glass of water on an empty stomach. Takes on Wednesday   Yes Historical Provider, MD  aspirin 325 MG tablet Take 325 mg by mouth daily.   Yes Historical Provider, MD  folic acid-pyridoxine-cyancobalamin (VIRT-VITE FORTE) 2.5-25-2 MG TABS Take 1 tablet by mouth once a week. Takes on days he takes methotrexate. (Saturdays)   Yes Historical Provider, MD  latanoprost (XALATAN) 0.005 % ophthalmic solution Place 1 drop into the left eye at bedtime.    Yes Historical Provider, MD  methotrexate (RHEUMATREX) 2.5 MG tablet Take 10 mg by mouth once a week. Caution:Chemotherapy. Protect from light.   Yes Historical Provider, MD  Multiple Vitamins-Minerals (OCUVITE ADULT 50+) CAPS Take 1 capsule by mouth daily.   Yes Historical Provider, MD  polyethylene glycol (MIRALAX / GLYCOLAX) packet Take 17 g by mouth daily as needed (for constipation).   Yes Historical Provider, MD  Probiotic Product (PROBIOTIC DAILY PO) Take 1 tablet by mouth daily.   Yes Historical Provider, MD  sodium chloride (OCEAN) 0.65 % SOLN nasal spray Place 1 spray into both nostrils 3 (three) times daily.   Yes Historical Provider, MD  tamsulosin (FLOMAX) 0.4 MG CAPS capsule Take 0.4 mg by mouth daily. 08/10/15  Yes Historical Provider, MD  timolol (TIMOPTIC) 0.5 % ophthalmic solution Place 1 drop into the left eye every evening.    Yes Historical Provider, MD  aspirin EC 81 MG EC tablet Take 1 tablet (81 mg total) by mouth daily. Patient not taking: Reported on 08/23/2015 08/21/15   Charlynne Cousins, MD  clopidogrel (PLAVIX) 75 MG tablet Take 1 tablet  (75 mg total) by mouth daily. Patient not taking: Reported on 08/23/2015 11/02/14   Marletta Lor, MD    Physical Exam: Filed Vitals:   08/24/15 0030 08/24/15 0045 08/24/15 0100 08/24/15 0200  BP: 145/92 142/84 142/87 144/74  Pulse: 80 77 81 79  Temp:    98.5 F (36.9 C)  TempSrc:    Oral  Resp: 16 12 17 18   Height:    5\' 11"  (1.803 m)  Weight:    75.978 kg (167 lb 8 oz)  SpO2: 96% 97% 96% 97%     General:  Moderately built and nourished.  Eyes: Anicteric no pallor. 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  ENT: No discharge from the ears eyes nose and mouth. Scalp laceration has been sutured.  Neck: No neck rigidity. No JVD appreciated.  Cardiovascular: S1 and S2 heard.  Respiratory: No rhonchi or crepitations.  Abdomen: Soft nontender bowel sounds present.  Skin: Scalp laceration.  Musculoskeletal: No edema.  Psychiatric: Appears normal.  Neurologic: Alert awake oriented to time place and person. Moves all extremities. No facial asymmetry. Right eye is blind. No facial asymmetry.  Labs on Admission:  Basic Metabolic Panel:  Recent Labs Lab 08/19/15 2044 08/21/15 1337 08/22/15 0730 08/23/15 0211 08/23/15 2207  NA 127* 134* 134* 136 133*  K 3.8 3.6 3.8 3.8 4.6  CL 98* 102 104 105 102  CO2 21* 27 23 24 22   GLUCOSE 119* 105* 107* 104* 148*  BUN 12 14 13 13 16   CREATININE 1.26* 1.38* 1.07 1.16 1.39*  CALCIUM 8.3* 8.4* 8.6* 8.5* 8.5*   Liver Function Tests:  Recent Labs Lab 08/19/15 2044 08/23/15 2207  AST 25 126*  ALT 17 96*  ALKPHOS 46 50  BILITOT 0.7 1.6*  PROT 7.0 6.8  ALBUMIN 3.4* 3.1*   No results for input(s): LIPASE, AMYLASE in the last 168 hours. No results for input(s): AMMONIA in the last 168 hours. CBC:  Recent Labs Lab 08/19/15 2035 08/19/15 2044 08/21/15 1337 08/23/15 2202  WBC  --  7.0 7.5 9.5  NEUTROABS  --  4.3  --   --   HGB 13.3 12.5* 12.3* 12.5*  HCT 39.0 36.2* 36.0* 37.0*  MCV  --  92.8 93.0 93.0  PLT  --  126*  139* 262   Cardiac Enzymes: No results for input(s): CKTOTAL, CKMB, CKMBINDEX, TROPONINI in the last 168 hours.  BNP (last 3 results) No results for input(s): BNP in the last 8760 hours.  ProBNP (last 3 results) No results for input(s): PROBNP in the last 8760 hours.  CBG:  Recent Labs Lab 08/19/15 2046  GLUCAP 118*    Radiological Exams on Admission: Ct Head Wo Contrast  08/24/2015  CLINICAL DATA:  74 year old male with syncope. EXAM: CT HEAD WITHOUT CONTRAST TECHNIQUE: Contiguous axial images were obtained from the base of the skull through the vertex without intravenous contrast. COMPARISON:  Brain CT and MRI dated 08/19/2015 FINDINGS: The ventricles are dilated and the sulci are prominent compatible with age-related atrophy. Periventricular and deep white matter hypodensities represent chronic microvascular ischemic changes. Stable right frontal lobe old infarct and encephalomalacia. There is no intracranial hemorrhage. No mass effect or midline shift identified. The visualized paranasal sinuses and mastoid air cells are well aerated. The calvarium is intact. There is a stable calcific changes of the right globe. IMPRESSION: No acute intracranial pathology. Age-related atrophy and chronic microvascular ischemic disease. Stable right frontal lobe old infarct and encephalomalacia. If symptoms persist and there are no contraindications, MRI may provide better evaluation if clinically indicated. Electronically Signed   By: Anner Crete M.D.   On: 08/24/2015 00:29   Dg Chest Portable 1 View  08/23/2015  CLINICAL DATA:  Initial evaluation for 2 episodes of syncope today, nausea, weakness. EXAM: PORTABLE CHEST 1 VIEW COMPARISON:  Prior radiograph from 08/19/2015. FINDINGS: Cardiomegaly is stable from prior. Mediastinal silhouette within normal limits. Tortuosity intrathoracic aorta with atheromatous plaque within the aortic arch noted, stable. Lungs are hypoinflated with bibasilar fibrotic  changes, left greater than right Ing, stable. No focal infiltrates. No pulmonary edema or pleural effusion. No pneumothorax. No acute osseus abnormality. IMPRESSION: Stable appearance of the chest with left greater than right bibasilar fibrosis and mild cardiomegaly. No active cardiopulmonary disease. Electronically Signed   By: Jeannine Boga M.D.   On: 08/23/2015 22:39   Ct  Angio Chest Aorta W/cm &/or Wo/cm  08/24/2015  CLINICAL DATA:  [place] today after becoming lightheaded. Was discharged from the hospital earlier today. EXAM: CT ANGIOGRAPHY CHEST, ABDOMEN AND PELVIS TECHNIQUE: Multidetector CT imaging through the chest, abdomen and pelvis was performed using the standard protocol during bolus administration of intravenous contrast. Multiplanar reconstructed images and MIPs were obtained and reviewed to evaluate the vascular anatomy. CONTRAST:  165mL OMNIPAQUE IOHEXOL 350 MG/ML SOLN COMPARISON:  07/23/2013 FINDINGS: CTA CHEST FINDINGS The thoracic aorta is normal in caliber. There is extensive atherosclerotic plaque. There is no dissection. Proximal great vessels are patent. There is good opacification of the pulmonary arteries with no evidence of pulmonary embolism. Review of the MIP images confirms the above findings. Nonvascular chest findings include multiple small thyroid nodules, multiple nonspecific nodes in the mediastinum and both hila, and fibrotic honeycombing in the basilar lung periphery. Central airways are patent. No pleural effusions or pericardial effusions. CTA ABDOMEN AND PELVIS FINDINGS The upper abdominal aorta is normal in caliber with extensive atherosclerotic plaque. There is mild fusiform dilatation of the infrarenal abdominal aorta to a maximum AP diameter 3.4 cm. There is mild stenosis of the celiac axis 1 cm from its origin. Superior mesenteric artery and inferior mesenteric artery are widely patent. Each kidney is served by a widely patent single main renal artery. The common  iliac and external iliac arteries are patent bilaterally. No dissection of the abdominal aorta. Review of the MIP images confirms the above findings. Nonvascular findings include extensive colonic diverticulosis without evidence of diverticulitis or other acute inflammatory process. There are unremarkable arterial phase appearances of the liver, spleen, pancreas, adrenals and kidneys. There is prior cholecystectomy. Bile ducts are unremarkable. Moderate prostatic enlargement. No significant skeletal lesions. IMPRESSION: 1. Extensive atherosclerotic plaque throughout the thoracic and abdominal aorta. There is no dissection. There is a 3.4 cm infrarenal abdominal aortic aneurysm. 2. Negative for acute pulmonary embolism. 3. Multiple tiny thyroid nodules. 4. Diverticulosis. Electronically Signed   By: Andreas Newport M.D.   On: 08/24/2015 00:34   Ct Angio Abd/pel W/ And/or W/o  08/24/2015  CLINICAL DATA:  [place] today after becoming lightheaded. Was discharged from the hospital earlier today. EXAM: CT ANGIOGRAPHY CHEST, ABDOMEN AND PELVIS TECHNIQUE: Multidetector CT imaging through the chest, abdomen and pelvis was performed using the standard protocol during bolus administration of intravenous contrast. Multiplanar reconstructed images and MIPs were obtained and reviewed to evaluate the vascular anatomy. CONTRAST:  130mL OMNIPAQUE IOHEXOL 350 MG/ML SOLN COMPARISON:  07/23/2013 FINDINGS: CTA CHEST FINDINGS The thoracic aorta is normal in caliber. There is extensive atherosclerotic plaque. There is no dissection. Proximal great vessels are patent. There is good opacification of the pulmonary arteries with no evidence of pulmonary embolism. Review of the MIP images confirms the above findings. Nonvascular chest findings include multiple small thyroid nodules, multiple nonspecific nodes in the mediastinum and both hila, and fibrotic honeycombing in the basilar lung periphery. Central airways are patent. No pleural  effusions or pericardial effusions. CTA ABDOMEN AND PELVIS FINDINGS The upper abdominal aorta is normal in caliber with extensive atherosclerotic plaque. There is mild fusiform dilatation of the infrarenal abdominal aorta to a maximum AP diameter 3.4 cm. There is mild stenosis of the celiac axis 1 cm from its origin. Superior mesenteric artery and inferior mesenteric artery are widely patent. Each kidney is served by a widely patent single main renal artery. The common iliac and external iliac arteries are patent bilaterally. No dissection of the abdominal aorta.  Review of the MIP images confirms the above findings. Nonvascular findings include extensive colonic diverticulosis without evidence of diverticulitis or other acute inflammatory process. There are unremarkable arterial phase appearances of the liver, spleen, pancreas, adrenals and kidneys. There is prior cholecystectomy. Bile ducts are unremarkable. Moderate prostatic enlargement. No significant skeletal lesions. IMPRESSION: 1. Extensive atherosclerotic plaque throughout the thoracic and abdominal aorta. There is no dissection. There is a 3.4 cm infrarenal abdominal aortic aneurysm. 2. Negative for acute pulmonary embolism. 3. Multiple tiny thyroid nodules. 4. Diverticulosis. Electronically Signed   By: Andreas Newport M.D.   On: 08/24/2015 00:34    EKG: Independently reviewed. Normal sinus rhythm with possible APCs and QT interval is 510 ms. Nonspecific ST-T changes.  Assessment/Plan Active Problems:   Chest pain   Syncope   ARF (acute renal failure) (HCC)   Pain in the chest   Faintness   1. Syncope with prolonged QT interval - at this time will closely monitor in telemetry for any arrhythmias. Patient's 2-D echo done 4 days ago showed EF of 60-65%. Patient was originally scheduled to get a cardiac monitor for his recent stroke by cardiology as outpatient. I have requested cardiology consult. Check orthostatics. Check magnesium  levels. 2. Chest pain - patient had a brief episode of chest pain in the ER. Patient states it was pressure-like. Prior to coming to the ER patient also had upper back pain. We will cycle cardiac markers. Patient is on aspirin. Presently chest pain-free. Cardiology consult has been requested. 3. Acute renal failure - probably from dehydration. Hydrate him closely monitor metabolic panel and follow intake output. 4. Recent stroke - on aspirin. On statins. 5. History of hypertension - presently not on any antihypertensives. Closely monitor blood pressure trends. 6. History of rheumatoid arthritis on methotrexate. 7. Thyroid nodules found in the CAT scan will need outpatient follow-up. 8. Hyperlipidemia on statins. 9. 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  I have reviewed patient's old charts and labs. Personally reviewed chest x-ray and EKG.   DVT Prophylaxis Lovenox.  Code Status: DO NOT RESUSCITATE.  Family Communication: Discussed with patient's wife.  Disposition Plan: Admit for observation.    KAKRAKANDY,ARSHAD N. [facility] Pager [phone].  If 7PM-7AM, please contact night-coverage [email or web address] Password [email or web address] 08/24/2015, 4:06 AM

## 2015-08-24 NOTE — Progress Notes (Signed)
Patient seen and examined this morning, H&P reviewed by Dr. Glyn Ade. Briefly patient is a 74 yo M who was recently admitted with CVA, discharged yesterday and re-admitted after a syncopal episode at home. He was supposed to get an event monitor as an outpatient after his discharge.  Syncope - cardiology consulted, perhaps to consider event monitor while here - CT angio negative  Chest pain - resolved, CE negative  AKI - improved with fluids  Recent CVA - continue Aspirin, statin  Costin M. Cruzita Lederer, MD [facility] [phone]

## 2015-08-25 ENCOUNTER — Encounter (HOSPITAL_COMMUNITY): Payer: Self-pay | Admitting: Cardiology

## 2015-08-25 ENCOUNTER — Encounter (HOSPITAL_COMMUNITY)
Admission: EM | Disposition: A | Discharge status: Home Health | Payer: Self-pay | Source: Home / Self Care | Attending: Internal Medicine

## 2015-08-25 DIAGNOSIS — I639 Cerebral infarction, unspecified: Secondary | ICD-10-CM

## 2015-08-25 HISTORY — PX: EP IMPLANTABLE DEVICE: SHX172B

## 2015-08-25 LAB — TROPONIN I

## 2015-08-25 SURGERY — LOOP RECORDER INSERTION
Anesthesia: LOCAL

## 2015-08-25 MED ORDER — LIDOCAINE-EPINEPHRINE 1 %-1:100000 IJ SOLN
INTRAMUSCULAR | Status: DC | PRN
Start: 2015-08-25 — End: 2015-08-25
  Administered 2015-08-25: 10 mL

## 2015-08-25 MED ORDER — AMLODIPINE BESYLATE 5 MG PO TABS: 5.0000 mg | ORAL_TABLET | Freq: Every day | ORAL | Status: DC

## 2015-08-25 MED ORDER — SIMVASTATIN 40 MG PO TABS: 40.0000 mg | ORAL_TABLET | Freq: Every day | ORAL | Status: AC

## 2015-08-25 MED ORDER — LIDOCAINE-EPINEPHRINE 1 %-1:100000 IJ SOLN
INTRAMUSCULAR | Status: AC
  Filled 2015-08-25: qty 1

## 2015-08-25 MED ORDER — NITROGLYCERIN 0.4 MG/SPRAY TL SOLN
Status: AC
  Filled 2015-08-25: qty 4.9

## 2015-08-25 MED ORDER — AMLODIPINE BESYLATE 5 MG PO TABS
5.0000 mg | ORAL_TABLET | Freq: Every day | ORAL | Status: DC
  Administered 2015-08-25: 5 mg via ORAL
  Filled 2015-08-25: qty 1

## 2015-08-25 MED ORDER — HYDRALAZINE HCL 20 MG/ML IJ SOLN
5.0000 mg | INTRAMUSCULAR | Status: DC | PRN
  Administered 2015-08-25: 5 mg via INTRAVENOUS
  Filled 2015-08-25: qty 1

## 2015-08-25 MED ORDER — ATORVASTATIN CALCIUM 20 MG PO TABS: 20.0000 mg | ORAL_TABLET | Freq: Every day | ORAL | Status: DC

## 2015-08-25 MED ORDER — TRAMADOL HCL 50 MG PO TABS: 25.0000 mg | ORAL_TABLET | Freq: Four times a day (QID) | ORAL | Status: DC | PRN

## 2015-08-25 SURGICAL SUPPLY — 2 items
LOOP REVEAL LINQSYS (Prosthesis & Implant Heart) ×2 IMPLANT
PACK LOOP INSERTION (CUSTOM PROCEDURE TRAY) ×2 IMPLANT

## 2015-08-25 NOTE — Progress Notes (Signed)
PT Cancellation Note  Patient Details Name: Brett Castaneda MRN: [id number] DOB: 1941/05/19   Cancelled Treatment:    Reason Eval/Treat Not Completed: Patient at procedure or test/unavailable. Transport taking pt off of the floor for loop recorder.  Will check back as schedule permits to complete evaluation.   Jorey Dollard LUBECK 08/25/2015, 12:06 PM

## 2015-08-25 NOTE — Discharge Instructions (Signed)
Follow with Nyoka Cowden, MD in 5-7 days  Please get a complete blood count and chemistry panel checked by your Primary MD at your next visit, and again as instructed by your Primary MD. Please get your medications reviewed and adjusted by your Primary MD.  Please request your Primary MD to go over all Hospital Tests and Procedure/Radiological results at the follow up, please get all Hospital records sent to your Prim MD by signing hospital release before you go home.  If you had Pneumonia of Lung problems at the Hospital: Please get a 2 view Chest X ray done in 6-8 weeks after hospital discharge or sooner if instructed by your Primary MD.  If you have Congestive Heart Failure: Please call your Cardiologist or Primary MD anytime you have any of the following symptoms:  1) 3 pound weight gain in 24 hours or 5 pounds in 1 week  2) shortness of breath, with or without a dry hacking cough  3) swelling in the hands, feet or stomach  4) if you have to sleep on extra pillows at night in order to breathe  Follow cardiac low salt diet and 1.5 lit/day fluid restriction.  If you have diabetes Accuchecks 4 times/day, Once in AM empty stomach and then before each meal. Log in all results and show them to your primary doctor at your next visit. If any glucose reading is under 80 or above 300 call your primary MD immediately.  If you have Seizure/Convulsions/Epilepsy: Please do not drive, operate heavy machinery, participate in activities at heights or participate in high speed sports until you have seen by Primary MD or a Neurologist and advised to do so again.  If you had Gastrointestinal Bleeding: Please ask your Primary MD to check a complete blood count within one week of discharge or at your next visit. Your endoscopic/colonoscopic biopsies that are pending at the time of discharge, will also need to followed by your Primary MD.  Get Medicines reviewed and adjusted. Please take all  your medications with you for your next visit with your Primary MD  Please request your Primary MD to go over all hospital tests and procedure/radiological results at the follow up, please ask your Primary MD to get all Hospital records sent to his/her office.  If you experience worsening of your admission symptoms, develop shortness of breath, life threatening emergency, suicidal or homicidal thoughts you must seek medical attention immediately by calling 911 or calling your MD immediately  if symptoms less severe.  You must read complete instructions/literature along with all the possible adverse reactions/side effects for all the Medicines you take and that have been prescribed to you. Take any new Medicines after you have completely understood and accpet all the possible adverse reactions/side effects.   Do not drive or operate heavy machinery when taking Pain medications.   Do not take more than prescribed Pain, Sleep and Anxiety Medications  Special Instructions: If you have smoked or chewed Tobacco  in the last 2 yrs please stop smoking, stop any regular Alcohol  and or any Recreational drug use.  Wear Seat belts while driving.  Please note You were cared for by a hospitalist during your hospital stay. If you have any questions about your discharge medications or the care you received while you were in the hospital after you are discharged, you can call the unit and asked to speak with the hospitalist on call if the hospitalist that took care of you is not available. Once  you are discharged, your primary care physician will handle any further medical issues. Please note that NO REFILLS for any discharge medications will be authorized once you are discharged, as it is imperative that you return to your primary care physician (or establish a relationship with a primary care physician if you do not have one) for your aftercare needs so that they can reassess your need for medications and monitor  your lab values.  You can reach the hospitalist office at phone [phone] or fax [phone]   If you do not have a primary care physician, you can call [phone] for a physician referral.  Activity: As tolerated with Full fall precautions use walker/cane & assistance as needed  Diet: heart healthy  Disposition Home

## 2015-08-25 NOTE — Discharge Summary (Signed)
Physician Discharge Summary  Brett Castaneda [name] DOB: 1941/06/08 DOA: 08/23/2015  PCP: Brett Cowden, MD  Admit date: 08/23/2015 Discharge date: 08/25/2015  Time spent: > 30 minutes  Recommendations for Outpatient Follow-up:  1. Follow up with Brett Castaneda as scheduled 2. ILD implanted by cardiology 3. Follow up with Brett Castaneda in 1-2 months   Discharge Diagnoses:  Active Problems:   Chest pain   Syncope   ARF (acute renal failure) (HCC)   Pain in the chest   Faintness  Discharge Condition: stable  Diet recommendation: heart healthy  Filed Weights   08/24/15 0200 08/24/15 1957  Weight: 75.978 kg (167 lb 8 oz) 78.472 kg (173 lb)    History of present illness:  See H&P, Labs, Consult and Test reports for all details in brief, patient is a 74 yo M who was recently admitted with CVA, discharged yesterday and re-admitted after a syncopal episode at home. He was supposed to get an event monitor as an outpatient after his discharge.  Hospital Course:  Patient was admitted with a syncopal episode without any preceding symptoms. Cardiology was consulted and have followed patient while hospitalized. He underwent an implantable loop recorder by EP.  HTN - patient found to be quite hypertensive associated with dizziness while SBP was in the 180s. This improved with BP control and was started on Norvasc 5 mg daily. Chest pain - resolved, CE negative AKI - improved with fluids Recent CVA - continue Aspirin, statin  Procedures:  ILD placement   Consultations:  Cardiology   Discharge Exam: Filed Vitals:   08/25/15 0329 08/25/15 0859 08/25/15 0900 08/25/15 0938  BP:  173/100 181/96 154/77  Pulse:  85  102  Temp:  98.8 F (37.1 C)    TempSrc:  Oral    Resp: 19 16    Height:      Weight:      SpO2:  100% 100% 100%    General: NAD Cardiovascular: RRR Respiratory: CTA biL  Discharge Instructions Activity:  As tolerated   Get Medicines reviewed  and adjusted: Please take all your medications with you for your next visit with your Primary MD  Please request your Primary MD to go over all hospital tests and procedure/radiological results at the follow up, please ask your Primary MD to get all Hospital records sent to his/her office.  If you experience worsening of your admission symptoms, develop shortness of breath, life threatening emergency, suicidal or homicidal thoughts you must seek medical attention immediately by calling 911 or calling your MD immediately if symptoms less severe.  You must read complete instructions/literature along with all the possible adverse reactions/side effects for all the Medicines you take and that have been prescribed to you. Take any new Medicines after you have completely understood and accpet all the possible adverse reactions/side effects.   Do not drive when taking Pain medications.   Do not take more than prescribed Pain, Sleep and Anxiety Medications  Special Instructions: If you have smoked or chewed Tobacco in the last 2 yrs please stop smoking, stop any regular Alcohol and or any Recreational drug use.  Wear Seat belts while driving.  Please note  You were cared for by a hospitalist during your hospital stay. Once you are discharged, your primary care physician will handle any further medical issues. Please note that NO REFILLS for any discharge medications will be authorized once you are discharged, as it is imperative that you return to your primary care physician (  or establish a relationship with a primary care physician if you do not have one) for your aftercare needs so that they can reassess your need for medications and monitor your lab values.    Medication List    STOP taking these medications        clopidogrel 75 MG tablet  Commonly known as:  PLAVIX      TAKE these medications        acetaminophen 500 MG tablet  Commonly known as:  TYLENOL  Take 500 mg by mouth every  6 (six) hours as needed for mild pain, moderate pain or fever.     alendronate 70 MG tablet  Commonly known as:  FOSAMAX  Take 70 mg by mouth once a week. Take with a full glass of water on an empty stomach. Takes on Wednesday     amLODipine 5 MG tablet  Commonly known as:  NORVASC  Take 1 tablet (5 mg total) by mouth daily.     aspirin 325 MG tablet  Take 325 mg by mouth daily.     latanoprost 0.005 % ophthalmic solution  Commonly known as:  XALATAN  Place 1 drop into the left eye at bedtime.     methotrexate 2.5 MG tablet  Commonly known as:  RHEUMATREX  Take 10 mg by mouth once a week. Caution:Chemotherapy. Protect from light.     OCUVITE ADULT 50+ Caps  Take 1 capsule by mouth daily.     polyethylene glycol packet  Commonly known as:  MIRALAX / GLYCOLAX  Take 17 g by mouth daily as needed (for constipation).     PROBIOTIC DAILY PO  Take 1 tablet by mouth daily.     simvastatin 40 MG tablet  Commonly known as:  ZOCOR  Take 1 tablet (40 mg total) by mouth daily at 6 PM.     sodium chloride 0.65 % Soln nasal spray  Commonly known as:  OCEAN  Place 1 spray into both nostrils 3 (three) times daily.     tamsulosin 0.4 MG Caps capsule  Commonly known as:  FLOMAX  Take 0.4 mg by mouth daily.     timolol 0.5 % ophthalmic solution  Commonly known as:  TIMOPTIC  Place 1 drop into the left eye every evening.     VIRT-VITE FORTE 2.5-25-2 MG Tabs tablet  Generic drug:  folic acid-pyridoxine-cyancobalamin  Take 1 tablet by mouth once a week. Takes on days he takes methotrexate. (Saturdays)           Follow-up Information    Follow up with Brett Cowden, MD. Schedule an appointment as soon as possible for a visit in 2 weeks.   Specialty:  Internal Medicine   Why:  APPOINTMENT:  Monday, 09-12-15 @ 4pm   Contact information:   [place] [address] [phone]       Follow up with SETHI,PRAMOD, MD. Schedule an appointment as soon as  possible for a visit in 2 months.   Specialties:  Neurology, Radiology   Why:  APPOINTMENT:  Monday, 11-21-15 @ 9:30am; ARRIVE BY: 9:15am for check in   Contact information:   [address] [place] [address] [phone]       The results of significant diagnostics from this hospitalization (including imaging, microbiology, ancillary and laboratory) are listed below for reference.    Significant Diagnostic Studies: Dg Chest 2 View  08/19/2015  CLINICAL DATA:  Stroke symptoms tonight. Fever. Hypertension. Ex-smoker. EXAM: CHEST  2 VIEW COMPARISON:  10/28/2013 FINDINGS: Mild cardiac enlargement without vascular congestion. Interstitial changes in the lung bases similar to previous study, likely fibrosis. No focal airspace disease. No blunting of costophrenic angles. No pneumothorax. Calcified and tortuous aorta. Surgical clips in the right upper quadrant. 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 Mild cardiac enlargement. No evidence of active pulmonary disease. Electronically Signed   By: Lucienne Capers M.D.   On: 08/19/2015 23:02   Ct Head Wo Contrast  08/24/2015  CLINICAL DATA:  74 year old male with syncope. EXAM: CT HEAD WITHOUT CONTRAST TECHNIQUE: Contiguous axial images were obtained from the base of the skull through the vertex without intravenous contrast. COMPARISON:  Brain CT and MRI dated 08/19/2015 FINDINGS: The ventricles are dilated and the sulci are prominent compatible with age-related atrophy. Periventricular and deep white matter hypodensities represent chronic microvascular ischemic changes. Stable right frontal lobe old infarct and encephalomalacia. There is no intracranial hemorrhage. No mass effect or midline shift identified. The visualized paranasal sinuses and mastoid air cells are well aerated. The calvarium is intact. There is a stable calcific changes of the right globe. IMPRESSION: No acute intracranial pathology. Age-related atrophy and chronic  microvascular ischemic disease. Stable right frontal lobe old infarct and encephalomalacia. If symptoms persist and there are no contraindications, MRI may provide better evaluation if clinically indicated. Electronically Signed   By: Anner Crete M.D.   On: 08/24/2015 00:29   Ct Head Wo Contrast  08/19/2015  CLINICAL DATA:  Altered mental status.  No weakness.  Code stroke. EXAM: CT HEAD WITHOUT CONTRAST TECHNIQUE: Contiguous axial images were obtained from the base of the skull through the vertex without intravenous contrast. COMPARISON:  07/16/2013 FINDINGS: Diffuse cerebral atrophy. No ventricular dilatation. Low-attenuation changes in the deep white matter consistent with small vessel ischemia. Focal encephalomalacia in the right frontal lobe consistent with old infarct. No mass effect or midline shift. No abnormal extra-axial fluid collections. Gray-white matter junctions are distinct. Basal cisterns are not effaced. No evidence of acute intracranial hemorrhage. No depressed skull fractures. Mild mucosal thickening in the paranasal sinuses. Mastoid air cells are not opacified. Vascular calcifications. Calcification and deformity of the right globe consistent with old insult. Focal increased density in the left globe likely postoperative. IMPRESSION: No acute intracranial abnormalities. Chronic atrophy and small vessel ischemic changes. Old right frontal infarct. These results were called by telephone at the time of interpretation on 08/19/2015 at 8:38 pm to Dr. Leonel Ramsay, who verbally acknowledged these results. Electronically Signed   By: Lucienne Capers M.D.   On: 08/19/2015 20:41   [name]  08/19/2015  CLINICAL DATA:  Initial valuation for acute altered mental status. EXAM: MRI HEAD WITHOUT CONTRAST TECHNIQUE: Multiplanar, multiecho pulse sequences of the brain and surrounding structures were obtained without intravenous contrast. COMPARISON:  Prior head CT from earlier the same  day P FINDINGS: Diffusion-weighted sequences demonstrate a possible tiny 3 mm cortical infarct within the left temporal lobe (series 3, image 20). No associated edema or hemorrhage. No other infarct. Largely absent flow void within the diminutive right vertebral artery, stable. Major intracranial vascular flow voids are otherwise maintained. Diffuse prominence of the CSF containing spaces is compatible with generalized age-related cerebral atrophy. Mild chronic small vessel ischemic disease in within the periventricular white matter. Encephalomalacia within the high right frontal lobe consistent with remote ischemic infarct. There is associated gliosis within this region. Multiple additional remote lacunar infarcts within the right basal ganglia involving both the caudate and lentiform  nuclei. No mass lesion, midline shift, or mass effect. No hydrocephalus. No extra-axial fluid collection. Pituitary gland normal. No acute abnormality about the globes. 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 Mild mucosal thickening within the ethmoidal air cells and maxillary sinuses. No mastoid effusion. Inner ear structures normal. Bone marrow signal intensity within normal limits. No scalp soft tissue abnormality. IMPRESSION: 1. 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 2. No other acute intracranial process identified. 3. Moderate to large remote infarct within the posterior right frontal lobe with extension into the operculum. Additional remote lacunar infarcts involving the right basal ganglia. 4. Nonvisualization of the right vertebral artery, which may be occluded. This is stable relative to previous MRI from 2014. 5. Atrophy with mild chronic small vessel ischemic disease. Electronically Signed   By: Jeannine Boga M.D.   On: 08/19/2015 23:14   Dg Chest Portable 1 View  08/23/2015  CLINICAL DATA:  Initial  evaluation for 2 episodes of syncope today, nausea, weakness. EXAM: PORTABLE CHEST 1 VIEW COMPARISON:  Prior radiograph from 08/19/2015. FINDINGS: Cardiomegaly is stable from prior. Mediastinal silhouette within normal limits. Tortuosity intrathoracic aorta with atheromatous plaque within the aortic arch noted, stable. Lungs are hypoinflated with bibasilar fibrotic changes, left greater than right Ing, stable. No focal infiltrates. No pulmonary edema or pleural effusion. No pneumothorax. No acute osseus abnormality. IMPRESSION: Stable appearance of the chest with left greater than right bibasilar fibrosis and mild cardiomegaly. No active cardiopulmonary disease. Electronically Signed   By: Jeannine Boga M.D.   On: 08/23/2015 22:39   Ct Angio Chest Aorta W/cm &/or Wo/cm  08/24/2015  CLINICAL DATA:  [place] today after becoming lightheaded. Was discharged from the hospital earlier today. EXAM: CT ANGIOGRAPHY CHEST, ABDOMEN AND PELVIS TECHNIQUE: Multidetector CT imaging through the chest, abdomen and pelvis was performed using the standard protocol during bolus administration of intravenous contrast. Multiplanar reconstructed images and MIPs were obtained and reviewed to evaluate the vascular anatomy. CONTRAST:  171mL OMNIPAQUE IOHEXOL 350 MG/ML SOLN COMPARISON:  07/23/2013 FINDINGS: CTA CHEST FINDINGS The thoracic aorta is normal in caliber. There is extensive atherosclerotic plaque. There is no dissection. Proximal great vessels are patent. There is good opacification of the pulmonary arteries with no evidence of pulmonary embolism. Review of the MIP images confirms the above findings. Nonvascular chest findings include multiple small thyroid nodules, multiple nonspecific nodes in the mediastinum and both hila, and fibrotic honeycombing in the basilar lung periphery. Central airways are patent. No pleural effusions or pericardial effusions. CTA ABDOMEN AND PELVIS FINDINGS The upper abdominal aorta is normal  in caliber with extensive atherosclerotic plaque. There is mild fusiform dilatation of the infrarenal abdominal aorta to a maximum AP diameter 3.4 cm. There is mild stenosis of the celiac axis 1 cm from its origin. Superior mesenteric artery and inferior mesenteric artery are widely patent. Each kidney is served by a widely patent single main renal artery. The common iliac and external iliac arteries are patent bilaterally. No dissection of the abdominal aorta. Review of the MIP images confirms the above findings. Nonvascular findings include extensive colonic diverticulosis without evidence of diverticulitis or other acute inflammatory process. There are unremarkable arterial phase appearances of the liver, spleen, pancreas, adrenals and kidneys. There is prior cholecystectomy. Bile ducts are unremarkable. Moderate prostatic enlargement. No significant skeletal lesions. IMPRESSION: 1. Extensive atherosclerotic plaque throughout the thoracic and abdominal aorta. There is no dissection. There is a  3.4 cm infrarenal abdominal aortic aneurysm. 2. Negative for acute pulmonary embolism. 3. Multiple tiny thyroid nodules. 4. Diverticulosis. Electronically Signed   By: Andreas Newport M.D.   On: 08/24/2015 00:34   Ct Angio Abd/pel W/ And/or W/o  08/24/2015  CLINICAL DATA:  [place] today after becoming lightheaded. Was discharged from the hospital earlier today. EXAM: CT ANGIOGRAPHY CHEST, ABDOMEN AND PELVIS TECHNIQUE: Multidetector CT imaging through the chest, abdomen and pelvis was performed using the standard protocol during bolus administration of intravenous contrast. Multiplanar reconstructed images and MIPs were obtained and reviewed to evaluate the vascular anatomy. CONTRAST:  14mL OMNIPAQUE IOHEXOL 350 MG/ML SOLN COMPARISON:  07/23/2013 FINDINGS: CTA CHEST FINDINGS The thoracic aorta is normal in caliber. There is extensive atherosclerotic plaque. There is no dissection. Proximal great vessels are patent. There  is good opacification of the pulmonary arteries with no evidence of pulmonary embolism. Review of the MIP images confirms the above findings. Nonvascular chest findings include multiple small thyroid nodules, multiple nonspecific nodes in the mediastinum and both hila, and fibrotic honeycombing in the basilar lung periphery. Central airways are patent. No pleural effusions or pericardial effusions. CTA ABDOMEN AND PELVIS FINDINGS The upper abdominal aorta is normal in caliber with extensive atherosclerotic plaque. There is mild fusiform dilatation of the infrarenal abdominal aorta to a maximum AP diameter 3.4 cm. There is mild stenosis of the celiac axis 1 cm from its origin. Superior mesenteric artery and inferior mesenteric artery are widely patent. Each kidney is served by a widely patent single main renal artery. The common iliac and external iliac arteries are patent bilaterally. No dissection of the abdominal aorta. Review of the MIP images confirms the above findings. Nonvascular findings include extensive colonic diverticulosis without evidence of diverticulitis or other acute inflammatory process. There are unremarkable arterial phase appearances of the liver, spleen, pancreas, adrenals and kidneys. There is prior cholecystectomy. Bile ducts are unremarkable. Moderate prostatic enlargement. No significant skeletal lesions. IMPRESSION: 1. Extensive atherosclerotic plaque throughout the thoracic and abdominal aorta. There is no dissection. There is a 3.4 cm infrarenal abdominal aortic aneurysm. 2. Negative for acute pulmonary embolism. 3. Multiple tiny thyroid nodules. 4. Diverticulosis. Electronically Signed   By: Andreas Newport M.D.   On: 08/24/2015 00:34    Microbiology: Recent Results (from the past 240 hour(s))  Blood culture (routine x 2)     Status: None   Collection Time: 08/19/15  9:45 PM  Result Value Ref Range Status   Specimen Description BLOOD RIGHT FOREARM  Final   Special Requests  IN PEDIATRIC BOTTLE [place]  Final   Culture NO GROWTH 5 DAYS  Final   Report Status 08/24/2015 FINAL  Final  Blood culture (routine x 2)     Status: None   Collection Time: 08/19/15  9:51 PM  Result Value Ref Range Status   Specimen Description BLOOD RIGHT HAND  Final   Special Requests BOTTLES DRAWN AEROBIC AND ANAEROBIC 5CC  Final   Culture NO GROWTH 5 DAYS  Final   Report Status 08/24/2015 FINAL  Final     Labs: Basic Metabolic Panel:  Recent Labs Lab 08/19/15 2035 08/19/15 2044 08/21/15 1337 08/22/15 0730 08/23/15 0211 08/23/15 2207 08/24/15 0427 08/24/15 1017  NA 133* 127* 134* 134* 136 133* 135  --   K 4.0 3.8 3.6 3.8 3.8 4.6 4.2  --   CL 98* 98* 102 104 105 102 107  --   CO2  --  21* 27 23 24 22 22   --  GLUCOSE 119* 119* 105* 107* 104* 148* 109*  --   BUN 15 12 14 13 13 16 12   --   CREATININE 1.30* 1.26* 1.38* 1.07 1.16 1.39* 1.13  --   CALCIUM  --  8.3* 8.4* 8.6* 8.5* 8.5* 7.9*  --   MG  --   --   --   --   --   --   --  1.8   Liver Function Tests:  Recent Labs Lab 08/19/15 2044 08/23/15 2207 08/24/15 0427  AST 25 126* 84*  ALT 17 96* 74*  ALKPHOS 46 50 43  BILITOT 0.7 1.6* 0.7  PROT 7.0 6.8 6.1*  ALBUMIN 3.4* 3.1* 2.7*   No results for input(s): LIPASE, AMYLASE in the last 168 hours. No results for input(s): AMMONIA in the last 168 hours. CBC:  Recent Labs Lab 08/19/15 2035 08/19/15 2044 08/21/15 1337 08/23/15 2202 08/24/15 0427  WBC  --  7.0 7.5 9.5 6.7  NEUTROABS  --  4.3  --   --  4.9  HGB 13.3 12.5* 12.3* 12.5* 11.0*  HCT 39.0 36.2* 36.0* 37.0* 32.8*  MCV  --  92.8 93.0 93.0 92.4  PLT  --  126* 139* 262 212   Cardiac Enzymes:  Recent Labs Lab 08/24/15 0427 08/24/15 1017 08/24/15 1533 08/25/15 1113 08/25/15 1445  TROPONINI <0.03 <0.03 <0.03 <0.03 <0.03   CBG:  Recent Labs Lab 08/19/15 2046  GLUCAP 118*       Signed:  Saren Corkern  [facility] 08/25/2015, 5:10 PM

## 2015-08-25 NOTE — Progress Notes (Signed)
Pt blood pressure elevated to 181/77 and complaining of dizziness. MD notified order for hydralazine place. Hydralazine given BP down to 155/65.

## 2015-08-25 NOTE — Progress Notes (Signed)
Pt complained of chest pain. Vital signs taken. MD notified order for ultram placed. EKG done. Will continue to monitor.

## 2015-08-25 NOTE — Progress Notes (Signed)
Seen and examined Brett Castaneda today.  Has had episodes of syncope as well as strokes in the past.  Seen by Dr. Lovena Le yesterday for potential LINQ placement.  Discussed the procedure today with the patient including risks and benefits.  Risks are bleeding and infection.  [name] plan to place LINQ today.  [name] 08/25/2015 12:05 PM

## 2015-08-25 NOTE — Care Management Note (Signed)
Case Management Note  Patient Details  Name: RIKKI SMESTAD MRN: [id number] Date of Birth: [birth year]  Subjective/Objective:          CM following for progression and d/c planning.          Action/Plan: 08/25/2015 Met with pt and wife re HHPT. Pt has previously used [facility] and wishes to use that agency again. [place] notified. Per pt and wife , this pt has no DME needs.   Expected Discharge Date:      08/25/2015            Expected Discharge Plan:  [place]  In-House Referral:  NA  Discharge planning Services  CM Consult  Post Acute Care Choice:    Choice offered to:  Spouse  DME Arranged:  N/A DME Agency:  NA  HH Arranged:  PT HH Agency:  [facility]  Status of Service:  Completed, signed off  Medicare Important Message Given:    Date Medicare IM Given:    Medicare IM give by:    Date Additional Medicare IM Given:    Additional Medicare Important Message give by:     If discussed at [place] of Stay Meetings, dates discussed:    Additional Comments:  Adron Bene, RN 08/25/2015, 1:43 PM

## 2015-08-25 NOTE — Progress Notes (Signed)
Pt unable to do standing ortho.(x2) do to dizziness.

## 2015-08-25 NOTE — Progress Notes (Signed)
Discharge instructions and medications discussed with patient & wife.  Prescriptions given to wife.  All questions answered.

## 2015-08-26 ENCOUNTER — Inpatient Hospital Stay (HOSPITAL_COMMUNITY)
Admission: EM | Admit: 2015-08-26 | Discharge: 2015-08-31 | DRG: 868 | Disposition: A | Discharge status: Skilled Nursing Facility | Payer: Medicare Other | Attending: Internal Medicine | Admitting: Internal Medicine

## 2015-08-26 DIAGNOSIS — G1221 Amyotrophic lateral sclerosis: Secondary | ICD-10-CM | POA: Diagnosis present

## 2015-08-26 DIAGNOSIS — E785 Hyperlipidemia, unspecified: Secondary | ICD-10-CM | POA: Diagnosis not present

## 2015-08-26 DIAGNOSIS — Z8673 Personal history of transient ischemic attack (TIA), and cerebral infarction without residual deficits: Secondary | ICD-10-CM | POA: Diagnosis not present

## 2015-08-26 DIAGNOSIS — Z885 Allergy status to narcotic agent status: Secondary | ICD-10-CM | POA: Diagnosis not present

## 2015-08-26 DIAGNOSIS — W19XXXA Unspecified fall, initial encounter: Secondary | ICD-10-CM | POA: Diagnosis not present

## 2015-08-26 DIAGNOSIS — K5900 Constipation, unspecified: Secondary | ICD-10-CM | POA: Diagnosis not present

## 2015-08-26 DIAGNOSIS — Z79899 Other long term (current) drug therapy: Secondary | ICD-10-CM | POA: Diagnosis not present

## 2015-08-26 DIAGNOSIS — A329 Listeriosis, unspecified: Secondary | ICD-10-CM | POA: Diagnosis not present

## 2015-08-26 DIAGNOSIS — Z85828 Personal history of other malignant neoplasm of skin: Secondary | ICD-10-CM | POA: Diagnosis not present

## 2015-08-26 DIAGNOSIS — R296 Repeated falls: Secondary | ICD-10-CM | POA: Diagnosis present

## 2015-08-26 DIAGNOSIS — R404 Transient alteration of awareness: Secondary | ICD-10-CM | POA: Diagnosis not present

## 2015-08-26 DIAGNOSIS — R278 Other lack of coordination: Secondary | ICD-10-CM | POA: Diagnosis not present

## 2015-08-26 DIAGNOSIS — R55 Syncope and collapse: Secondary | ICD-10-CM | POA: Diagnosis not present

## 2015-08-26 DIAGNOSIS — R509 Fever, unspecified: Secondary | ICD-10-CM | POA: Diagnosis not present

## 2015-08-26 DIAGNOSIS — K219 Gastro-esophageal reflux disease without esophagitis: Secondary | ICD-10-CM | POA: Diagnosis present

## 2015-08-26 DIAGNOSIS — Z96653 Presence of artificial knee joint, bilateral: Secondary | ICD-10-CM | POA: Diagnosis present

## 2015-08-26 DIAGNOSIS — Z87891 Personal history of nicotine dependence: Secondary | ICD-10-CM

## 2015-08-26 DIAGNOSIS — I10 Essential (primary) hypertension: Secondary | ICD-10-CM | POA: Diagnosis not present

## 2015-08-26 DIAGNOSIS — R2681 Unsteadiness on feet: Secondary | ICD-10-CM | POA: Diagnosis not present

## 2015-08-26 DIAGNOSIS — Z888 Allergy status to other drugs, medicaments and biological substances status: Secondary | ICD-10-CM

## 2015-08-26 DIAGNOSIS — Z8249 Family history of ischemic heart disease and other diseases of the circulatory system: Secondary | ICD-10-CM | POA: Diagnosis not present

## 2015-08-26 DIAGNOSIS — R7881 Bacteremia: Secondary | ICD-10-CM | POA: Diagnosis not present

## 2015-08-26 DIAGNOSIS — R42 Dizziness and giddiness: Secondary | ICD-10-CM | POA: Insufficient documentation

## 2015-08-26 DIAGNOSIS — A3289 Other forms of listeriosis: Secondary | ICD-10-CM | POA: Diagnosis not present

## 2015-08-26 DIAGNOSIS — H5441 Blindness, right eye, normal vision left eye: Secondary | ICD-10-CM | POA: Diagnosis not present

## 2015-08-26 DIAGNOSIS — I639 Cerebral infarction, unspecified: Secondary | ICD-10-CM | POA: Diagnosis present

## 2015-08-26 DIAGNOSIS — I6529 Occlusion and stenosis of unspecified carotid artery: Secondary | ICD-10-CM | POA: Diagnosis not present

## 2015-08-26 DIAGNOSIS — Z823 Family history of stroke: Secondary | ICD-10-CM

## 2015-08-26 DIAGNOSIS — R41841 Cognitive communication deficit: Secondary | ICD-10-CM | POA: Diagnosis not present

## 2015-08-26 DIAGNOSIS — M05721 Rheumatoid arthritis with rheumatoid factor of right elbow without organ or systems involvement: Secondary | ICD-10-CM

## 2015-08-26 DIAGNOSIS — S0101XA Laceration without foreign body of scalp, initial encounter: Secondary | ICD-10-CM | POA: Diagnosis not present

## 2015-08-26 DIAGNOSIS — E871 Hypo-osmolality and hyponatremia: Secondary | ICD-10-CM | POA: Diagnosis present

## 2015-08-26 DIAGNOSIS — Z66 Do not resuscitate: Secondary | ICD-10-CM | POA: Diagnosis present

## 2015-08-26 DIAGNOSIS — M069 Rheumatoid arthritis, unspecified: Secondary | ICD-10-CM | POA: Diagnosis present

## 2015-08-26 DIAGNOSIS — M6281 Muscle weakness (generalized): Secondary | ICD-10-CM | POA: Diagnosis not present

## 2015-08-26 DIAGNOSIS — Z9181 History of falling: Secondary | ICD-10-CM | POA: Diagnosis not present

## 2015-08-26 DIAGNOSIS — R531 Weakness: Secondary | ICD-10-CM

## 2015-08-26 DIAGNOSIS — Z7982 Long term (current) use of aspirin: Secondary | ICD-10-CM

## 2015-08-26 LAB — CBC WITH DIFFERENTIAL/PLATELET
Basophils Absolute: 0 10*3/uL (ref 0.0–0.1)
Basophils Relative: 0 %
Eosinophils Absolute: 0.1 10*3/uL (ref 0.0–0.7)
Eosinophils Relative: 1 %
HCT: 36.5 % — ABNORMAL LOW (ref 39.0–52.0)
Hemoglobin: 12.5 g/dL — ABNORMAL LOW (ref 13.0–17.0)
Lymphocytes Relative: 24 %
Lymphs Abs: 1.1 10*3/uL (ref 0.7–4.0)
MCH: 31.6 pg (ref 26.0–34.0)
MCHC: 34.2 g/dL (ref 30.0–36.0)
MCV: 92.4 fL (ref 78.0–100.0)
Monocytes Absolute: 0.3 10*3/uL (ref 0.1–1.0)
Monocytes Relative: 7 %
Neutro Abs: 3.1 10*3/uL (ref 1.7–7.7)
Neutrophils Relative %: 68 %
Platelets: 256 10*3/uL (ref 150–400)
RBC: 3.95 MIL/uL — ABNORMAL LOW (ref 4.22–5.81)
RDW: 13.8 % (ref 11.5–15.5)
WBC: 4.5 10*3/uL (ref 4.0–10.5)

## 2015-08-26 LAB — URINALYSIS, ROUTINE W REFLEX MICROSCOPIC
Bilirubin Urine: NEGATIVE
Glucose, UA: NEGATIVE mg/dL
Hgb urine dipstick: NEGATIVE
KETONES UR: NEGATIVE mg/dL
LEUKOCYTES UA: NEGATIVE
NITRITE: NEGATIVE
PROTEIN: NEGATIVE mg/dL
Specific Gravity, Urine: 1.012 (ref 1.005–1.030)
pH: 7 (ref 5.0–8.0)

## 2015-08-26 LAB — BASIC METABOLIC PANEL
Anion gap: 8 (ref 5–15)
BUN: 11 mg/dL (ref 6–20)
CHLORIDE: 100 mmol/L — AB (ref 101–111)
CO2: 24 mmol/L (ref 22–32)
Calcium: 8.4 mg/dL — ABNORMAL LOW (ref 8.9–10.3)
Creatinine, Ser: 1.17 mg/dL (ref 0.61–1.24)
GFR calc non Af Amer: >60 mL/min (ref >60)
Glucose, Bld: 124 mg/dL — ABNORMAL HIGH (ref 65–99)
POTASSIUM: 3.6 mmol/L (ref 3.5–5.1)
SODIUM: 132 mmol/L — AB (ref 135–145)

## 2015-08-26 LAB — I-STAT TROPONIN, ED: Troponin i, poc: 0.03 ng/mL (ref 0.00–0.08)

## 2015-08-26 MED ORDER — HYDROCODONE-ACETAMINOPHEN 5-325 MG PO TABS: 2.0000 | ORAL_TABLET | Freq: Once | ORAL | Status: DC

## 2015-08-26 MED ORDER — MECLIZINE HCL 25 MG PO TABS
25.0000 mg | ORAL_TABLET | Freq: Once | ORAL | Status: AC
  Administered 2015-08-26: 25 mg via ORAL
  Filled 2015-08-26: qty 1

## 2015-08-26 MED ORDER — HYDRALAZINE HCL 20 MG/ML IJ SOLN
10.0000 mg | Freq: Four times a day (QID) | INTRAMUSCULAR | Status: DC | PRN
  Filled 2015-08-26: qty 1

## 2015-08-26 MED ORDER — ACETAMINOPHEN 325 MG PO TABS
650.0000 mg | ORAL_TABLET | Freq: Once | ORAL | Status: AC
  Administered 2015-08-26: 650 mg via ORAL
  Filled 2015-08-26: qty 2

## 2015-08-26 MED ORDER — MECLIZINE HCL 50 MG PO TABS: 50.0000 mg | ORAL_TABLET | Freq: Three times a day (TID) | ORAL | Status: DC | PRN

## 2015-08-26 MED ORDER — SODIUM CHLORIDE 0.9 % IV BOLUS (SEPSIS)
1000.0000 mL | Freq: Once | INTRAVENOUS | Status: AC
  Administered 2015-08-26: 1000 mL via INTRAVENOUS

## 2015-08-26 MED ORDER — DIAZEPAM 5 MG/ML IJ SOLN
2.5000 mg | Freq: Once | INTRAMUSCULAR | Status: AC
  Administered 2015-08-26: 2.5 mg via INTRAVENOUS
  Filled 2015-08-26: qty 2

## 2015-08-26 MED ORDER — FAMOTIDINE 20 MG PO TABS
20.0000 mg | ORAL_TABLET | Freq: Once | ORAL | Status: AC
  Administered 2015-08-26: 20 mg via ORAL
  Filled 2015-08-26: qty 1

## 2015-08-26 NOTE — ED Notes (Signed)
Pt was able to ambulate with moderate assistance. Pt was a little unsteady on his feet initially. He stated he had not been on his feet for a weeks time. Pt tolerated well.

## 2015-08-26 NOTE — ED Provider Notes (Signed)
CSN: [id number]     Arrival date & time 08/26/15  1342 History   First MD Initiated Contact with Patient 08/26/15 1344     Chief Complaint  Patient presents with  . Dizziness   HPI  Brett Castaneda is a 74 year old male with PMHx of CVA, GERD, HTN and RA presenting with lightheadedness. Patient states that he was discharged from the hospital yesterday afternoon and since then has not been able to sit forward or stand up without extreme lightheadedness and feeling like he was about to pass out. He states that he has been bedbound since discharge due to this. He states that the lightheadedness resolves as long as he is laying down or propped up in bed. He denies room spinning but does say that it makes him feel very off balance when he sits or stands. He has not fallen due to the dizziness. The lightheadedness is not accompanied with diaphoresis, chest pain, shortness of breath or nausea. Denies fevers, chills, headache, syncope, chest pain, shortness of breath, abdominal pain, nausea, vomiting or dysuria. He has no other complaints today.  Patient was admitted 11/25-11/29 with small CVA. He then presented to the emergency department on 11/30 with a fall due to syncope. He had a negative workup and had an ILR placed.  Past Medical History  Diagnosis Date  . BASAL CELL CARCINOMA, FACE 03/31/2009  . 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 CONSTIPATION 06/22/2008  . DIVERTICULOSIS, COLON 03/24/2007  . GERD 03/24/2007  . HYPERLIPIDEMIA 08/27/2007  . HYPERTENSION 12/14/2008  . Rheumatoid arthritis(714.0) 03/24/2007  . Blind right eye   . Retinal detachment     hx of  . Keratosis Oct. 2013  . Fall at home Nov. 5, 2014  06-28-13    in the home and Outside as well  . 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 Stroke [facility]) Oct 2014  . 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 Total knee arthroplasty      bital  . Carpal tunnel release Right 2003  . Cataract extraction  1980    removed cataract in left eye. Currently blind in right eye  . Cholecystectomy  2001    Gall Bladder  . Retinal detachment surgery  2004  . Lumbar puncture  Oct. 21, 2014  . Nerve conduction    . Mri  07-14-13  . Carotid endarterectomy Right 08-15-07    cea  . Joint replacement  2000    Right knee replacement  . Eye surgery  [id number]    retinal detachment  . Eye surgery      left cataract surgery  . Shoulder surgery Left 2009    hx "frozen" shoulder  . Ep implantable device N/A 08/25/2015    Procedure: Loop Recorder Insertion;  Surgeon: Will Meredith Leeds, MD;  Location: [place] [facility];  Service: Cardiovascular;  Laterality: N/A;   Family History  Problem Relation Age of Onset  . Stroke Mother 64  . Hypertension Mother   . Heart disease Father   . Aneurysm Father   . Coronary artery disease Sister   . Pneumonia Sister   . Heart disease Sister   . Diabetes Sister   . Hypertension Sister   . Colon cancer Neg Hx    Social History  Substance Use Topics  . Smoking status: Former Smoker -- 23 years    Types: Cigarettes    Quit date: 09/24/1980  . Smokeless tobacco: Never Used  . Alcohol Use: No    Review of Systems  Constitutional: Negative for fever and chills.  Eyes: Negative for visual disturbance.  Respiratory: Negative for shortness of breath.   Cardiovascular: Negative for chest pain.  Gastrointestinal: Negative for nausea, vomiting and abdominal pain.  Musculoskeletal: Positive for gait problem.  Neurological: Positive for dizziness and light-headedness. Negative for syncope, facial asymmetry, speech difficulty, weakness and headaches.  All other systems reviewed and are negative.     Allergies  Dopamine; Morphine and related; and Vicodin  Home Medications   Prior to Admission medications   Medication Sig  Start Date End Date Taking? Authorizing Provider  acetaminophen (TYLENOL) 500 MG tablet Take 500 mg by mouth every 6 (six) hours as needed for mild pain, moderate pain or fever.    Yes Historical Provider, MD  alendronate (FOSAMAX) 70 MG tablet Take 70 mg by mouth once a week. Take with a full glass of water on an empty stomach. Takes on Wednesday   Yes Historical Provider, MD  amLODipine (NORVASC) 5 MG tablet Take 1 tablet (5 mg total) by mouth daily. 08/25/15  Yes Costin Karlyne Greenspan, MD  aspirin EC 325 MG tablet Take 325 mg by mouth daily.   Yes Historical Provider, MD  folic acid-pyridoxine-cyancobalamin (VIRT-VITE FORTE) 2.5-25-2 MG TABS Take 1 tablet by mouth once a week. Takes on days he takes methotrexate. (Saturdays)   Yes Historical Provider, MD  latanoprost (XALATAN) 0.005 % ophthalmic solution Place 1 drop into the left eye at bedtime.    Yes Historical Provider, MD  methotrexate (RHEUMATREX) 2.5 MG tablet Take 10 mg by mouth once a week. Caution:Chemotherapy. Protect from light.   Yes Historical Provider, MD  Multiple Vitamins-Minerals (OCUVITE ADULT 50+) CAPS Take 1 capsule by mouth daily.   Yes Historical Provider, MD  polyethylene glycol (MIRALAX / GLYCOLAX) packet Take 17 g by mouth daily as needed (for constipation).   Yes Historical Provider, MD  Probiotic Product (PROBIOTIC DAILY PO) Take 1 tablet by mouth daily.   Yes Historical Provider, MD  simvastatin (ZOCOR) 40 MG tablet Take 1 tablet (40 mg total) by mouth daily at 6 PM. 08/25/15  Yes Costin Karlyne Greenspan, MD  sodium chloride (OCEAN) 0.65 % SOLN nasal spray Place 1 spray into both nostrils 3 (three) times daily.   Yes Historical Provider, MD  tamsulosin (FLOMAX) 0.4 MG CAPS capsule Take 0.4 mg by mouth daily. 08/10/15  Yes Historical Provider, MD  timolol (TIMOPTIC) 0.5 % ophthalmic solution Place 1 drop into the left eye every evening.    Yes Historical Provider, MD  aspirin 325 MG tablet Take 325 mg by mouth daily.    Historical  Provider, MD  meclizine (ANTIVERT) 50 MG tablet Take 1 tablet (50 mg total) by mouth 3 (three) times daily as needed. 08/26/15   Tannie Koskela, PA-C   BP 150/102 mmHg  Pulse 77  Temp(Src) 98.9 F (37.2 C) (Oral)  Resp 20  SpO2 97% Physical Exam  Constitutional: He is oriented to person, place, and time. He appears well-developed and well-nourished. No distress.  HENT:  Head: Normocephalic and atraumatic.  Mouth/Throat: Oropharynx is clear and moist.  Well-healed 4 cm laceration to left for head.  Eyes: Conjunctivae and EOM are normal. Pupils are equal, round, and reactive to light. Right eye exhibits no discharge.  Left eye exhibits no discharge. No scleral icterus.  Blind in right eye  Neck: Normal range of motion. Neck supple.  Cardiovascular: Normal rate, regular rhythm and normal heart sounds.   Pulmonary/Chest: Effort normal and breath sounds normal. No respiratory distress. He has no wheezes. He has no rales.  Abdominal: Soft. He exhibits no distension. There is no tenderness.  Musculoskeletal: Normal range of motion. He exhibits no edema or tenderness.  Moves all extremities spontaneously and without pain  Neurological: He is alert and oriented to person, place, and time. No cranial nerve deficit. Coordination normal.  Cranial nerves III through XII tested and intact. 5 out of 5 strength in all major muscle groups. Sensation to light touch intact throughout.  Skin: Skin is warm and dry.  Psychiatric: He has a normal mood and affect. His behavior is normal.  Nursing note and vitals reviewed.   ED Course  Procedures (including critical care time) Labs Review Labs Reviewed  CBC WITH DIFFERENTIAL/PLATELET - Abnormal; Notable for the following:    RBC 3.95 (*)    Hemoglobin 12.5 (*)    HCT 36.5 (*)    All other components within normal limits  BASIC METABOLIC PANEL - Abnormal; Notable for the following:    Sodium 132 (*)    Chloride 100 (*)    Glucose, Bld 124 (*)     Calcium 8.4 (*)    All other components within normal limits  URINALYSIS, ROUTINE W REFLEX MICROSCOPIC (NOT AT [facility])  I-STAT TROPOININ, ED    Imaging Review No results found. I have personally reviewed and evaluated these images and lab results as part of my medical decision-making.   EKG Interpretation   Date/Time:  Friday August 26 2015 17:35:56 EST Ventricular Rate:  76 PR Interval:  143 QRS Duration: 99 QT Interval:  404 QTC Calculation: 454 R Axis:   -19 Text Interpretation:  Sinus rhythm Borderline left axis deviation  Confirmed by ALLEN  MD, ANTHONY ([id number]) on 08/26/2015 5:40:20 PM      MDM   Final diagnoses:  Dizziness   74 year old male presenting with dizziness upon sitting and standing. He denies all other associated symptoms. He was recently discharged from the hospital after a CVA and then a syncopal event with a negative workup. Patient is pleasant and nontoxic appearing. Nonfocal neurological exam. He does get moderately dizzy upon sitting forward in bed. Blood work is unremarkable. EKG is sinus rhythm. Description of lightheadedness and dizziness with movement such as sitting forward and walking likely a vertigo-like presentation. Patient she did with a liter bolus, meclizine and Valium in the emergency department. After receiving this, patient was able to sit forward without dizziness and ambulated with assistance in the emergency department. Patient and wife inform that they have walkers at home and they can use to aid his stability. Patient was seen and assessed by Dr. Stark Jock who agrees with assessment and plan to discharge. Discharged with prescription for meclizine and instruction to follow up with his PCP. Return precautions given in discharge paperwork and discussed with pt at bedside. Pt stable for discharge     Josephina Gip, PA-C 08/26/15 [place], MD 08/27/15 1019

## 2015-08-26 NOTE — H&P (Signed)
[facility] Admission History and Physical       Brett Castaneda [name] DOB: 22-Jun-1941 DOA: 08/26/2015  Referring physician: EDP PCP: Nyoka Cowden, MD  Specialists:   Chief Complaint: Fall and Passed Out  HPI: Brett Castaneda is a 74 y.o. male with a history of CVAs 02/2007, and 10/ 2014 and 07/2015, HTN, RA, ALS who presented to the ED with complaints of increased Dizziness, Light headedness and Weakness.  He was evaluated in the ED, and was treated with Meclozine and had some relief.  At time of discharge, however, he stumbled and fell backward and hit the back of his head.   He was observed to have syncope for about 20 seconds but no seizure activity.  The history has been obtained from the Wife who is at the bedside.   He was referred for observation.     Of Note: Patient was discharged from the hospital 1 day ago for AKI and Syncope and had a Loop Recorder placed a that time.      Review of Systems: Unable to Obtain from the Patient Constitutional: No Weight Loss, No Weight Gain, Night Sweats, Fevers, Chills, +Dizziness, +Light Headedness, Fatigue, +Generalized Weakness HEENT: No Headaches, Difficulty Swallowing,Tooth/Dental Problems,Sore Throat,  No Sneezing, Rhinitis, Ear Ache, Nasal Congestion, or Post Nasal Drip,  Cardio-vascular:  No Chest pain, Orthopnea, PND, Edema in Lower Extremities, Anasarca, Dizziness, Palpitations  Resp: No Dyspnea, No DOE, No Productive Cough, No Non-Productive Cough, No Hemoptysis, No Wheezing.    GI: No Heartburn, Indigestion, Abdominal Pain, Nausea, Vomiting, Diarrhea, Constipation, Hematemesis, Hematochezia, Melena, Change in Bowel Habits,  Loss of Appetite  GU: No Dysuria, No Change in Color of Urine, No Urgency or Urinary Frequency, No Flank pain.  Musculoskeletal: No Joint Pain or Swelling, No Decreased Range of Motion, No Back Pain.  Neurologic:  +Syncope, No Seizures, Muscle Weakness, Paresthesia, Vision Disturbance or  Loss, No Diplopia, No Vertigo, No Difficulty Walking,  Skin: No Rash or Lesions. Psych: No Change in Mood or Affect, No Depression or Anxiety, No Memory loss, No Confusion, or Hallucinations         Past Medical History  Diagnosis Date  . BASAL CELL CARCINOMA, FACE 03/31/2009  . 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 CONSTIPATION 06/22/2008  . DIVERTICULOSIS, COLON 03/24/2007  . GERD 03/24/2007  . HYPERLIPIDEMIA 08/27/2007  . HYPERTENSION 12/14/2008  . Rheumatoid arthritis(714.0) 03/24/2007  . Blind right eye   . Retinal detachment     hx of  . Keratosis Oct. 2013  . Fall at home Nov. 5, 2014  06-28-13    in the home and Outside as well  . 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 Stroke [facility]) Oct 2014  . 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 Total knee arthroplasty      bital  . Carpal tunnel release Right 2003  . Cataract extraction  1980    removed cataract in left eye. Currently blind in right eye  . Cholecystectomy  2001    Gall Bladder  . Retinal detachment surgery  2004  . Lumbar puncture  Oct. 21, 2014  . Nerve conduction    . Mri  07-14-13  . Carotid endarterectomy Right 08-15-07    cea  . Joint replacement  2000  Right knee replacement  . Eye surgery  [id number]    retinal detachment  . Eye surgery      left cataract surgery  . Shoulder surgery Left 2009    hx "frozen" shoulder  . Ep implantable device N/A 08/25/2015    Procedure: Loop Recorder Insertion;  Surgeon: Will Meredith Leeds, MD;  Location: [place] [facility];  Service: Cardiovascular;  Laterality: N/A;      Prior to Admission medications   Medication Sig Start Date End Date Taking? Authorizing Provider  acetaminophen (TYLENOL) 500 MG tablet Take 500 mg by mouth every 6 (six) hours as needed for mild pain,  moderate pain or fever.    Yes Historical Provider, MD  alendronate (FOSAMAX) 70 MG tablet Take 70 mg by mouth once a week. Take with a full glass of water on an empty stomach. Takes on Wednesday   Yes Historical Provider, MD  amLODipine (NORVASC) 5 MG tablet Take 1 tablet (5 mg total) by mouth daily. 08/25/15  Yes Costin Karlyne Greenspan, MD  aspirin EC 325 MG tablet Take 325 mg by mouth daily.   Yes Historical Provider, MD  folic acid-pyridoxine-cyancobalamin (VIRT-VITE FORTE) 2.5-25-2 MG TABS Take 1 tablet by mouth once a week. Takes on days he takes methotrexate. (Saturdays)   Yes Historical Provider, MD  latanoprost (XALATAN) 0.005 % ophthalmic solution Place 1 drop into the left eye at bedtime.    Yes Historical Provider, MD  methotrexate (RHEUMATREX) 2.5 MG tablet Take 10 mg by mouth once a week. Caution:Chemotherapy. Protect from light.   Yes Historical Provider, MD  Multiple Vitamins-Minerals (OCUVITE ADULT 50+) CAPS Take 1 capsule by mouth daily.   Yes Historical Provider, MD  polyethylene glycol (MIRALAX / GLYCOLAX) packet Take 17 g by mouth daily as needed (for constipation).   Yes Historical Provider, MD  Probiotic Product (PROBIOTIC DAILY PO) Take 1 tablet by mouth daily.   Yes Historical Provider, MD  simvastatin (ZOCOR) 40 MG tablet Take 1 tablet (40 mg total) by mouth daily at 6 PM. 08/25/15  Yes Costin Karlyne Greenspan, MD  sodium chloride (OCEAN) 0.65 % SOLN nasal spray Place 1 spray into both nostrils 3 (three) times daily.   Yes Historical Provider, MD  tamsulosin (FLOMAX) 0.4 MG CAPS capsule Take 0.4 mg by mouth daily. 08/10/15  Yes Historical Provider, MD  timolol (TIMOPTIC) 0.5 % ophthalmic solution Place 1 drop into the left eye every evening.    Yes Historical Provider, MD  aspirin 325 MG tablet Take 325 mg by mouth daily.    Historical Provider, MD  meclizine (ANTIVERT) 50 MG tablet Take 1 tablet (50 mg total) by mouth 3 (three) times daily as needed. 08/26/15   Stevi Barrett, PA-C      Allergies  Allergen Reactions  . Dopamine Anaphylaxis  . Morphine And Related Other (See Comments)    Hallucinations, very bad reactions  . Vicodin [Hydrocodone-Acetaminophen] Nausea And Vomiting    Social History:  reports that he quit smoking about 34 years ago. His smoking use included Cigarettes. He quit after 23 years of use. He has never used smokeless tobacco. He reports that he does not drink alcohol or use illicit drugs.    Family History  Problem Relation Age of Onset  . Stroke Mother 7  . Hypertension Mother   . Heart disease Father   . Aneurysm Father   . Coronary artery disease Sister   . Pneumonia Sister   . Heart disease Sister   . Diabetes  Sister   . Hypertension Sister   . Colon cancer Neg Hx        Physical Exam:  GEN:  Pleasant Elderly Well Developed 74 y.o. Caucasian male examined and in no acute distress; cooperative with exam Filed Vitals:   08/26/15 2045 08/26/15 2100 08/26/15 2115 08/26/15 2130  BP: 175/103 179/108 169/116 173/99  Pulse: 81 95 90 91  Temp:      TempSrc:      Resp: 18 20 21 10   SpO2: 98% 98% 97% 98%   Blood pressure 173/99, pulse 91, temperature 98.9 F (37.2 C), temperature source Oral, resp. rate 10, SpO2 98 %. PSYCH: He is alert and oriented x 4; does not appear anxious does not appear depressed; affect is normal HEENT: Normocephalic and Atraumatic, Mucous membranes pink; PERRLA; EOM intact; Fundi:  Benign;  No scleral icterus, Nares: Patent, Oropharynx: Clear,  Fair Dentition,    Neck:  FROM, No Cervical Lymphadenopathy nor Thyromegaly or Carotid Bruit; No JVD; Breasts:: Not examined CHEST WALL: No tenderness CHEST: Normal respiration, clear to auscultation bilaterally HEART: Regular rate and rhythm; no murmurs rubs or gallops BACK: No kyphosis or scoliosis; No CVA tenderness ABDOMEN: Positive Bowel Sounds, Soft Non-Tender, No Rebound or Guarding; No Masses, No Organomegaly,  Rectal Exam: Not done EXTREMITIES: No  Cyanosis, Clubbing, or Edema; No Ulcerations. Genitalia: not examined PULSES: 2+ and symmetric SKIN: Normal hydration no rash or ulceration CNS:  Alert and Oriented x 4, Mild Residual Left Sided WEakness Vascular: pulses palpable throughout    Labs on Admission:  Basic Metabolic Panel:  Recent Labs Lab 08/22/15 0730 08/23/15 0211 08/23/15 2207 08/24/15 0427 08/24/15 1017 08/26/15 1502  NA 134* 136 133* 135  --  132*  K 3.8 3.8 4.6 4.2  --  3.6  CL 104 105 102 107  --  100*  CO2 23 24 22 22   --  24  GLUCOSE 107* 104* 148* 109*  --  124*  BUN 13 13 16 12   --  11  CREATININE 1.07 1.16 1.39* 1.13  --  1.17  CALCIUM 8.6* 8.5* 8.5* 7.9*  --  8.4*  MG  --   --   --   --  1.8  --    Liver Function Tests:  Recent Labs Lab 08/23/15 2207 08/24/15 0427  AST 126* 84*  ALT 96* 74*  ALKPHOS 50 43  BILITOT 1.6* 0.7  PROT 6.8 6.1*  ALBUMIN 3.1* 2.7*   No results for input(s): LIPASE, AMYLASE in the last 168 hours. No results for input(s): AMMONIA in the last 168 hours. CBC:  Recent Labs Lab 08/21/15 1337 08/23/15 2202 08/24/15 0427 08/26/15 1502  WBC 7.5 9.5 6.7 4.5  NEUTROABS  --   --  4.9 3.1  HGB 12.3* 12.5* 11.0* 12.5*  HCT 36.0* 37.0* 32.8* 36.5*  MCV 93.0 93.0 92.4 92.4  PLT 139* 262 212 256   Cardiac Enzymes:  Recent Labs Lab 08/24/15 0427 08/24/15 1017 08/24/15 1533 08/25/15 1113 08/25/15 1445  TROPONINI <0.03 <0.03 <0.03 <0.03 <0.03    BNP (last 3 results) No results for input(s): BNP in the last 8760 hours.  ProBNP (last 3 results) No results for input(s): PROBNP in the last 8760 hours.  CBG: No results for input(s): GLUCAP in the last 168 hours.  Radiological Exams on Admission: No results found.   EKG: Independently reviewed. Normal Sinus Rhythm Rate = 76   Assessment/Plan:   73 y.o. male with  Principal Problem:   1.  Syncope   Check Orthostatics   Cardiac Monitoring   Neuro Checks   Active Problems:   2.      Dizziness   Meclozine PRN   Fall Precauations     3.     Fall   Fall Precautions     4.     Scalp laceration   Wound Care      5.     Weakness- due to ALS, and chronic Conditions and Recent CVA   PT evaluation     6.     Dyslipidemia   On Simvastatin Rx     7.     Essential hypertension   Continue Amlodipine     8.     Rheumatoid arthritis (HCC)   On MTX and Folic Acid Rx     9.     ALS (amyotrophic lateral sclerosis) (HCC)   Chronic   10.     DVT Prophylaxis   Lovenox    Code Status:      DO NOT RESUSCITATE (DNR)      Family Communication:   Wife at Bedside  Disposition Plan:    Observation Status        Time spent:  61 Minutes      Theressa Millard [facility] Pager [phone]   If [place] Please Contact the Day Rounding Team MD for [facility]  If 7PM-7AM, Please Contact Night-Floor Coverage  [email or web address] Password [email or web address] 08/26/2015, 11:05 PM     ADDENDUM:   Patient was seen and examined on 08/26/2015

## 2015-08-26 NOTE — ED Notes (Addendum)
Patient C/O dizziness that he has been seen for in the recent past.  Patient was D/C from the hospital yesterday S/P loop recorder placement.

## 2015-08-26 NOTE — ED Notes (Signed)
Patient to ED with C/O syncopal episodes.  States that every time he sits up he passes out. States that he was taking a shower yesterday and passed out in the shower.  Also C/O severe acid reflux. Patient is currently sitting up in the stretcher without problems.

## 2015-08-26 NOTE — ED Notes (Signed)
Pt was getting out of bed to get into wheelchair when he stumbled and fell backward to floor, hitting head on desk counter. When retrieving pt noticed approximately 1" laceration on back of head. When pt sat forward, pt had syncopal episode and suffered momentary LOC. Nursing team moved pt to bed, where pt was placed on NRB with O2 at 10L/min. Pt SPO2 was 100%. Pt now on room air SPO2 98%.

## 2015-08-26 NOTE — ED Provider Notes (Signed)
Patient seen here today for dizziness and lightheadedness. Was seen by the primary provider and treated with meclizine and felt somewhat better. At time of discharge, patient became dizzy lightheaded when being transferred from the bed to chair. He stumbled to the ground and then he had a syncopal event. There is no seizure activity noted. He came to after about 20 seconds. Patient just discharged from the hospital one day ago after having a workup for acute kidney injury as well as syncope. Had a loop recorder implanted. Patient stating a laceration to his scalp from this new fall which was repaired with 2 staples. He is alert and oriented 4 at this time. There is no postictal phase after he passed out. Patient had sutures placed prior to my seeing him today from an earlier fall. Will consult hospitalist for admission  Lacretia Leigh, MD 08/26/15 2227

## 2015-08-27 ENCOUNTER — Observation Stay (HOSPITAL_COMMUNITY): Payer: Medicare Other

## 2015-08-27 ENCOUNTER — Encounter (HOSPITAL_COMMUNITY): Payer: Self-pay | Admitting: *Deleted

## 2015-08-27 DIAGNOSIS — G1221 Amyotrophic lateral sclerosis: Secondary | ICD-10-CM | POA: Diagnosis not present

## 2015-08-27 DIAGNOSIS — R509 Fever, unspecified: Secondary | ICD-10-CM | POA: Diagnosis not present

## 2015-08-27 DIAGNOSIS — E785 Hyperlipidemia, unspecified: Secondary | ICD-10-CM | POA: Diagnosis not present

## 2015-08-27 DIAGNOSIS — R55 Syncope and collapse: Secondary | ICD-10-CM | POA: Diagnosis not present

## 2015-08-27 DIAGNOSIS — R531 Weakness: Secondary | ICD-10-CM | POA: Diagnosis not present

## 2015-08-27 DIAGNOSIS — M069 Rheumatoid arthritis, unspecified: Secondary | ICD-10-CM | POA: Diagnosis not present

## 2015-08-27 DIAGNOSIS — I10 Essential (primary) hypertension: Secondary | ICD-10-CM | POA: Diagnosis not present

## 2015-08-27 DIAGNOSIS — R7881 Bacteremia: Secondary | ICD-10-CM | POA: Diagnosis not present

## 2015-08-27 LAB — CBC
HCT: 36.9 % — ABNORMAL LOW (ref 39.0–52.0)
Hemoglobin: 12.3 g/dL — ABNORMAL LOW (ref 13.0–17.0)
MCH: 30.9 pg (ref 26.0–34.0)
MCHC: 33.3 g/dL (ref 30.0–36.0)
MCV: 92.7 fL (ref 78.0–100.0)
PLATELETS: 234 10*3/uL (ref 150–400)
RBC: 3.98 MIL/uL — AB (ref 4.22–5.81)
RDW: 13.7 % (ref 11.5–15.5)
WBC: 5.4 10*3/uL (ref 4.0–10.5)

## 2015-08-27 LAB — BASIC METABOLIC PANEL
Anion gap: 6 (ref 5–15)
BUN: 8 mg/dL (ref 6–20)
CALCIUM: 8.3 mg/dL — AB (ref 8.9–10.3)
CHLORIDE: 103 mmol/L (ref 101–111)
CO2: 25 mmol/L (ref 22–32)
CREATININE: 1.12 mg/dL (ref 0.61–1.24)
GFR calc non Af Amer: >60 mL/min (ref >60)
Glucose, Bld: 105 mg/dL — ABNORMAL HIGH (ref 65–99)
Potassium: 3.9 mmol/L (ref 3.5–5.1)
SODIUM: 134 mmol/L — AB (ref 135–145)

## 2015-08-27 LAB — URINALYSIS, ROUTINE W REFLEX MICROSCOPIC
BILIRUBIN URINE: NEGATIVE
GLUCOSE, UA: NEGATIVE mg/dL
Hgb urine dipstick: NEGATIVE
KETONES UR: NEGATIVE mg/dL
Leukocytes, UA: NEGATIVE
Nitrite: NEGATIVE
PH: 7 (ref 5.0–8.0)
Protein, ur: NEGATIVE mg/dL
SPECIFIC GRAVITY, URINE: 1.011 (ref 1.005–1.030)

## 2015-08-27 LAB — TROPONIN I
TROPONIN I: 0.03 ng/mL (ref <0.031)
TROPONIN I: 0.03 ng/mL (ref <0.031)
Troponin I: 0.03 ng/mL (ref <0.031)

## 2015-08-27 MED ORDER — ASPIRIN EC 325 MG PO TBEC
325.0000 mg | DELAYED_RELEASE_TABLET | Freq: Every day | ORAL | Status: DC
  Administered 2015-08-27 – 2015-08-31 (×5): 325 mg via ORAL
  Filled 2015-08-27 (×5): qty 1

## 2015-08-27 MED ORDER — ACETAMINOPHEN 325 MG PO TABS
650.0000 mg | ORAL_TABLET | Freq: Four times a day (QID) | ORAL | Status: DC | PRN
  Administered 2015-08-27 – 2015-08-31 (×5): 650 mg via ORAL
  Filled 2015-08-27 (×5): qty 2

## 2015-08-27 MED ORDER — METHOTREXATE 2.5 MG PO TABS
10.0000 mg | ORAL_TABLET | ORAL | Status: DC
  Administered 2015-08-27: 10 mg via ORAL
  Filled 2015-08-27: qty 4

## 2015-08-27 MED ORDER — SIMVASTATIN 40 MG PO TABS
40.0000 mg | ORAL_TABLET | Freq: Every day | ORAL | Status: DC
  Administered 2015-08-27 – 2015-08-30 (×5): 40 mg via ORAL
  Filled 2015-08-27 (×5): qty 1

## 2015-08-27 MED ORDER — ALUM & MAG HYDROXIDE-SIMETH 200-200-20 MG/5ML PO SUSP: 30.0000 mL | Freq: Four times a day (QID) | ORAL | Status: DC | PRN

## 2015-08-27 MED ORDER — POLYETHYLENE GLYCOL 3350 17 G PO PACK
17.0000 g | PACK | Freq: Every day | ORAL | Status: DC | PRN
  Administered 2015-08-28 – 2015-08-29 (×2): 17 g via ORAL
  Filled 2015-08-27 (×2): qty 1

## 2015-08-27 MED ORDER — HYDROMORPHONE HCL 1 MG/ML IJ SOLN: 0.5000 mg | INTRAMUSCULAR | Status: DC | PRN

## 2015-08-27 MED ORDER — FA-PYRIDOXINE-CYANOCOBALAMIN 2.5-25-2 MG PO TABS
1.0000 | ORAL_TABLET | ORAL | Status: DC
  Administered 2015-08-27: 1 via ORAL
  Filled 2015-08-27: qty 1

## 2015-08-27 MED ORDER — RISAQUAD PO CAPS
1.0000 | ORAL_CAPSULE | Freq: Every day | ORAL | Status: DC
  Administered 2015-08-27: 1 via ORAL
  Filled 2015-08-27: qty 1

## 2015-08-27 MED ORDER — AMLODIPINE BESYLATE 5 MG PO TABS
5.0000 mg | ORAL_TABLET | Freq: Every day | ORAL | Status: DC
  Administered 2015-08-27 – 2015-08-31 (×5): 5 mg via ORAL
  Filled 2015-08-27 (×5): qty 1

## 2015-08-27 MED ORDER — ENOXAPARIN SODIUM 40 MG/0.4ML ~~LOC~~ SOLN
40.0000 mg | SUBCUTANEOUS | Status: DC
  Administered 2015-08-28 – 2015-08-29 (×2): 40 mg via SUBCUTANEOUS
  Filled 2015-08-27 (×3): qty 0.4

## 2015-08-27 MED ORDER — ONDANSETRON HCL 4 MG PO TABS: 4.0000 mg | ORAL_TABLET | Freq: Four times a day (QID) | ORAL | Status: DC | PRN

## 2015-08-27 MED ORDER — SODIUM CHLORIDE 0.9 % IV SOLN
INTRAVENOUS | Status: DC
  Administered 2015-08-27: 01:00:00 via INTRAVENOUS

## 2015-08-27 MED ORDER — TIMOLOL MALEATE 0.5 % OP SOLN
1.0000 [drp] | Freq: Every evening | OPHTHALMIC | Status: DC
  Administered 2015-08-27 – 2015-08-30 (×4): 1 [drp] via OPHTHALMIC
  Filled 2015-08-27: qty 5

## 2015-08-27 MED ORDER — SODIUM CHLORIDE 0.9 % IV SOLN: INTRAVENOUS | Status: DC

## 2015-08-27 MED ORDER — HYDROMORPHONE HCL 1 MG/ML IJ SOLN
0.5000 mg | INTRAMUSCULAR | Status: DC | PRN
  Administered 2015-08-27: 1 mg via INTRAVENOUS
  Filled 2015-08-27: qty 1

## 2015-08-27 MED ORDER — IBUPROFEN 600 MG PO TABS
600.0000 mg | ORAL_TABLET | Freq: Four times a day (QID) | ORAL | Status: DC | PRN
  Administered 2015-08-28 – 2015-08-30 (×3): 600 mg via ORAL
  Filled 2015-08-27 (×3): qty 1

## 2015-08-27 MED ORDER — OXYCODONE HCL 5 MG PO TABS
5.0000 mg | ORAL_TABLET | ORAL | Status: DC | PRN
  Administered 2015-08-27 – 2015-08-29 (×5): 5 mg via ORAL
  Filled 2015-08-27 (×6): qty 1

## 2015-08-27 MED ORDER — PANTOPRAZOLE SODIUM 40 MG PO TBEC
40.0000 mg | DELAYED_RELEASE_TABLET | Freq: Every day | ORAL | Status: DC
  Administered 2015-08-27 – 2015-08-31 (×5): 40 mg via ORAL
  Filled 2015-08-27 (×5): qty 1

## 2015-08-27 MED ORDER — ONDANSETRON HCL 4 MG/2ML IJ SOLN: 4.0000 mg | Freq: Four times a day (QID) | INTRAMUSCULAR | Status: DC | PRN

## 2015-08-27 MED ORDER — OCUVITE-LUTEIN PO CAPS
1.0000 | ORAL_CAPSULE | Freq: Every day | ORAL | Status: DC
  Administered 2015-08-27 – 2015-08-31 (×5): 1 via ORAL
  Filled 2015-08-27 (×5): qty 1

## 2015-08-27 MED ORDER — ACETAMINOPHEN 650 MG RE SUPP: 650.0000 mg | Freq: Four times a day (QID) | RECTAL | Status: DC | PRN

## 2015-08-27 MED ORDER — SODIUM CHLORIDE 0.9 % IJ SOLN
3.0000 mL | Freq: Two times a day (BID) | INTRAMUSCULAR | Status: DC
  Administered 2015-08-27 – 2015-08-31 (×6): 3 mL via INTRAVENOUS

## 2015-08-27 MED ORDER — ENOXAPARIN SODIUM 30 MG/0.3ML ~~LOC~~ SOLN
30.0000 mg | SUBCUTANEOUS | Status: DC
  Administered 2015-08-27: 30 mg via SUBCUTANEOUS
  Filled 2015-08-27: qty 0.3

## 2015-08-27 MED ORDER — LATANOPROST 0.005 % OP SOLN
1.0000 [drp] | Freq: Every day | OPHTHALMIC | Status: DC
Start: 2015-08-27 — End: 2015-08-31
  Administered 2015-08-27 – 2015-08-30 (×3): 1 [drp] via OPHTHALMIC
  Filled 2015-08-27: qty 2.5

## 2015-08-27 NOTE — Progress Notes (Addendum)
PATIENT DETAILS Name: Brett Castaneda Age: 74 y.o. Sex: male Date of Birth: 1941-05-02 Admit Date: 08/26/2015 Admitting Physician Theressa Millard, MD [id number] [name], MD  Subjective: Known dizziness on sitting up today. Back pain much better.  Assessment/Plan: Principal Problem: Recurrent Syncope: EKG/telemetry negative. Orthostatics on admission negative. Have asked to interrogate loop recorder. Recent transthoracic echocardiogram on 11/26 showed preserved ejection fraction. Continue telemetry monitoring. Follow  Addendum 6 [id number] recorder interogation completed-per [employer] Rep and Cards PA-C Dayna Dun-neg for significant events  Active Problems: Fever: Febrile to 101 this a.m.-UA negative for UTI, chest x-ray negative for pneumonia-blood cultures pending. Looks nontoxic-other vital signs remains stable. No other foci of infection apparent on exam. Continue to monitor off antibiotics.  Recent CVA: Nonfocal exam. Continue aspirin, statin. 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  Scalp laceration: Secondary to fall-remove sutures in 1 week  Hypertension: Controlled-continue amlodipine  Dyslipidemia: Continue statin  History of unspecified peripheral neuropathy:? Related to methotrexate  ? ALS: Reviewed prior note from Dr. Leonie Man on 11/29-he doubts ALS.  History of rheumatoid arthritis: Hold methotrexate-given fever  Deconditioning/frequent falls: PT evaluation-suspect best by SNF placement.Consult social workl  Disposition: Remain inpatient-await further work up-suspect will require SNF  Antimicrobial agents  See below  Anti-infectives    None      DVT Prophylaxis: Prophylactic Lovenox  Code Status:  DNR  Family Communication Spouse/Daughter at bedside  Procedures: None  CONSULTS:  None  Time spent 30 minutes-Greater than 50% of this time was spent in counseling, explanation of  diagnosis, planning of further management, and coordination of care.  MEDICATIONS: Scheduled Meds: . acidophilus  1 capsule Oral Daily  . amLODipine  5 mg Oral Daily  . aspirin EC  325 mg Oral Daily  . [START ON 08/28/2015] enoxaparin (LOVENOX) injection  40 mg Subcutaneous Q24H  . folic acid-pyridoxine-cyancobalamin  1 tablet Oral Q Sat  . latanoprost  1 drop Left Eye QHS  . methotrexate  10 mg Oral Q Sat  . multivitamin-lutein  1 capsule Oral Daily  . simvastatin  40 mg Oral q1800  . sodium chloride  3 mL Intravenous Q12H  . timolol  1 drop Left Eye QPM   Continuous Infusions: . sodium chloride 75 mL/hr at 08/27/15 1300  . sodium chloride     PRN Meds:.acetaminophen **OR** acetaminophen, alum & mag hydroxide-simeth, hydrALAZINE, HYDROmorphone (DILAUDID) injection, ondansetron **OR** ondansetron (ZOFRAN) IV, oxyCODONE, polyethylene glycol    PHYSICAL EXAM: Vital signs in last 24 hours: Filed Vitals:   08/27/15 0800 08/27/15 0925 08/27/15 1026 08/27/15 1126  BP: 154/91 136/82  142/82  Pulse: 103 88  88  Temp: 101.9 F (38.8 C)  98.9 F (37.2 C) 98 F (36.7 C)  TempSrc: Oral  Oral Oral  Resp: 18 12  17   Height:      Weight:      SpO2:  96%  96%    Weight change:  Filed Weights   08/27/15 0046  Weight: 76.749 kg (169 lb 3.2 oz)   Body mass index is 23.61 kg/(m^2).   Gen Exam: Awake and alert with clear speech. Not in any distress-able to sit up without any dizziness or back pain Neck: Supple, No JVD.   Chest: B/L Clear.   CVS: S1 S2 Regular, no murmurs.  Abdomen: soft, BS +, non tender, non distended.  Extremities: no edema, lower extremities warm to  touch. Neurologic: Non Focal-5/5 in all 4 extremities  Skin: No Rash.   Wounds: N/A.   Intake/Output from previous day:  Intake/Output Summary (Last 24 hours) at 08/27/15 1431 Last data filed at 08/27/15 1300  Gross per 24 hour  Intake 1247.5 ml  Output    750 ml  Net  497.5 ml     LAB  RESULTS: CBC  Recent Labs Lab 08/21/15 1337 08/23/15 2202 08/24/15 0427 08/26/15 1502 08/27/15 0634  WBC 7.5 9.5 6.7 4.5 5.4  HGB 12.3* 12.5* 11.0* 12.5* 12.3*  HCT 36.0* 37.0* 32.8* 36.5* 36.9*  PLT 139* 262 212 256 234  MCV 93.0 93.0 92.4 92.4 92.7  MCH 31.8 31.4 31.0 31.6 30.9  MCHC 34.2 33.8 33.5 34.2 33.3  RDW 13.8 13.6 13.6 13.8 13.7  LYMPHSABS  --   --  1.4 1.1  --   MONOABS  --   --  0.3 0.3  --   EOSABS  --   --  0.1 0.1  --   BASOSABS  --   --  0.0 0.0  --     Chemistries   Recent Labs Lab 08/23/15 0211 08/23/15 2207 08/24/15 0427 08/24/15 1017 08/26/15 1502 08/27/15 0634  NA 136 133* 135  --  132* 134*  K 3.8 4.6 4.2  --  3.6 3.9  CL 105 102 107  --  100* 103  CO2 24 22 22   --  24 25  GLUCOSE 104* 148* 109*  --  124* 105*  BUN 13 16 12   --  11 8  CREATININE 1.16 1.39* 1.13  --  1.17 1.12  CALCIUM 8.5* 8.5* 7.9*  --  8.4* 8.3*  MG  --   --   --  1.8  --   --     CBG: No results for input(s): GLUCAP in the last 168 hours.  GFR Estimated Creatinine Clearance: 63.5 mL/min (by C-G formula based on Cr of 1.12).  Coagulation profile  Recent Labs Lab 08/23/15 2202  INR 1.11    Cardiac Enzymes  Recent Labs Lab 08/25/15 1445 08/27/15 0135 08/27/15 0634  TROPONINI <0.03 0.03 0.03    Invalid input(s): POCBNP No results for input(s): DDIMER in the last 72 hours. No results for input(s): HGBA1C in the last 72 hours. No results for input(s): CHOL, HDL, LDLCALC, TRIG, CHOLHDL, LDLDIRECT in the last 72 hours. No results for input(s): TSH, T4TOTAL, T3FREE, THYROIDAB in the last 72 hours.  Invalid input(s): FREET3 No results for input(s): VITAMINB12, FOLATE, FERRITIN, TIBC, IRON, RETICCTPCT in the last 72 hours. No results for input(s): LIPASE, AMYLASE in the last 72 hours.  Urine Studies No results for input(s): UHGB, CRYS in the last 72 hours.  Invalid input(s): UACOL, UAPR, USPG, UPH, UTP, UGL, UKET, UBIL, UNIT, UROB, ULEU, UEPI, UWBC,  URBC, UBAC, CAST, UCOM, BILUA  MICROBIOLOGY: Recent Results (from the past 240 hour(s))  Blood culture (routine x 2)     Status: None   Collection Time: 08/19/15  9:45 PM  Result Value Ref Range Status   Specimen Description BLOOD RIGHT FOREARM  Final   Special Requests IN PEDIATRIC BOTTLE [place]  Final   Culture NO GROWTH 5 DAYS  Final   Report Status 08/24/2015 FINAL  Final  Blood culture (routine x 2)     Status: None   Collection Time: 08/19/15  9:51 PM  Result Value Ref Range Status   Specimen Description BLOOD RIGHT HAND  Final   Special Requests BOTTLES DRAWN  AEROBIC AND ANAEROBIC 5CC  Final   Culture NO GROWTH 5 DAYS  Final   Report Status 08/24/2015 FINAL  Final    RADIOLOGY STUDIES/RESULTS: Dg Chest 2 View  08/19/2015  CLINICAL DATA:  Stroke symptoms tonight. Fever. Hypertension. Ex-smoker. EXAM: CHEST  2 VIEW COMPARISON:  10/28/2013 FINDINGS: Mild cardiac enlargement without vascular congestion. Interstitial changes in the lung bases similar to previous study, likely fibrosis. No focal airspace disease. No blunting of costophrenic angles. No pneumothorax. Calcified and tortuous aorta. Surgical clips in the right upper quadrant. 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 Mild cardiac enlargement. No evidence of active pulmonary disease. Electronically Signed   By: Lucienne Capers M.D.   On: 08/19/2015 23:02   Ct Head Wo Contrast  08/24/2015  CLINICAL DATA:  74 year old male with syncope. EXAM: CT HEAD WITHOUT CONTRAST TECHNIQUE: Contiguous axial images were obtained from the base of the skull through the vertex without intravenous contrast. COMPARISON:  Brain CT and MRI dated 08/19/2015 FINDINGS: The ventricles are dilated and the sulci are prominent compatible with age-related atrophy. Periventricular and deep white matter hypodensities represent chronic microvascular ischemic changes. Stable right frontal lobe old infarct and encephalomalacia. There is no intracranial hemorrhage. No  mass effect or midline shift identified. The visualized paranasal sinuses and mastoid air cells are well aerated. The calvarium is intact. There is a stable calcific changes of the right globe. IMPRESSION: No acute intracranial pathology. Age-related atrophy and chronic microvascular ischemic disease. Stable right frontal lobe old infarct and encephalomalacia. If symptoms persist and there are no contraindications, MRI may provide better evaluation if clinically indicated. Electronically Signed   By: Anner Crete M.D.   On: 08/24/2015 00:29   Ct Head Wo Contrast  08/19/2015  CLINICAL DATA:  Altered mental status.  No weakness.  Code stroke. EXAM: CT HEAD WITHOUT CONTRAST TECHNIQUE: Contiguous axial images were obtained from the base of the skull through the vertex without intravenous contrast. COMPARISON:  07/16/2013 FINDINGS: Diffuse cerebral atrophy. No ventricular dilatation. Low-attenuation changes in the deep white matter consistent with small vessel ischemia. Focal encephalomalacia in the right frontal lobe consistent with old infarct. No mass effect or midline shift. No abnormal extra-axial fluid collections. Gray-white matter junctions are distinct. Basal cisterns are not effaced. No evidence of acute intracranial hemorrhage. No depressed skull fractures. Mild mucosal thickening in the paranasal sinuses. Mastoid air cells are not opacified. Vascular calcifications. Calcification and deformity of the right globe consistent with old insult. Focal increased density in the left globe likely postoperative. IMPRESSION: No acute intracranial abnormalities. Chronic atrophy and small vessel ischemic changes. Old right frontal infarct. These results were called by telephone at the time of interpretation on 08/19/2015 at 8:38 pm to Dr. Leonel Ramsay, who verbally acknowledged these results. Electronically Signed   By: Lucienne Capers M.D.   On: 08/19/2015 20:41   [name]  08/19/2015  CLINICAL  DATA:  Initial valuation for acute altered mental status. EXAM: MRI HEAD WITHOUT CONTRAST TECHNIQUE: Multiplanar, multiecho pulse sequences of the brain and surrounding structures were obtained without intravenous contrast. COMPARISON:  Prior head CT from earlier the same day P FINDINGS: Diffusion-weighted sequences demonstrate a possible tiny 3 mm cortical infarct within the left temporal lobe (series 3, image 20). No associated edema or hemorrhage. No other infarct. Largely absent flow void within the diminutive right vertebral artery, stable. Major intracranial vascular flow voids are otherwise maintained. Diffuse prominence of the CSF containing spaces is compatible with generalized age-related cerebral atrophy.  Mild chronic small vessel ischemic disease in within the periventricular white matter. Encephalomalacia within the high right frontal lobe consistent with remote ischemic infarct. There is associated gliosis within this region. Multiple additional remote lacunar infarcts within the right basal ganglia involving both the caudate and lentiform nuclei. No mass lesion, midline shift, or mass effect. No hydrocephalus. No extra-axial fluid collection. Pituitary gland normal. No acute abnormality about the globes. 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 Mild mucosal thickening within the ethmoidal air cells and maxillary sinuses. No mastoid effusion. Inner ear structures normal. Bone marrow signal intensity within normal limits. No scalp soft tissue abnormality. IMPRESSION: 1. 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 2. No other acute intracranial process identified. 3. Moderate to large remote infarct within the posterior right frontal lobe with extension into the operculum. Additional remote lacunar infarcts involving the right basal ganglia. 4. Nonvisualization of the right vertebral artery, which may be  occluded. This is stable relative to previous MRI from 2014. 5. Atrophy with mild chronic small vessel ischemic disease. Electronically Signed   By: Jeannine Boga M.D.   On: 08/19/2015 23:14   Dg Chest Port 1 View  08/27/2015  CLINICAL DATA:  Fever with back pain. History of ALS, CVA and rheumatoid arthritis. EXAM: PORTABLE CHEST 1 VIEW COMPARISON:  CT and radiographs 08/23/2015. FINDINGS: 0831 hours. There are lower lung volumes. Patient has chronic fibrotic changes at both lung bases with probable mild superimposed atelectasis related to the lower lung volumes. There is no consolidation or significant pleural effusion. The heart size and mediastinal contours are stable. IMPRESSION: Stable fibrotic changes at both lung bases with mild superimposed atelectasis. No evidence of pneumonia. Electronically Signed   By: Richardean Sale M.D.   On: 08/27/2015 10:45   Dg Chest Portable 1 View  08/23/2015  CLINICAL DATA:  Initial evaluation for 2 episodes of syncope today, nausea, weakness. EXAM: PORTABLE CHEST 1 VIEW COMPARISON:  Prior radiograph from 08/19/2015. FINDINGS: Cardiomegaly is stable from prior. Mediastinal silhouette within normal limits. Tortuosity intrathoracic aorta with atheromatous plaque within the aortic arch noted, stable. Lungs are hypoinflated with bibasilar fibrotic changes, left greater than right Ing, stable. No focal infiltrates. No pulmonary edema or pleural effusion. No pneumothorax. No acute osseus abnormality. IMPRESSION: Stable appearance of the chest with left greater than right bibasilar fibrosis and mild cardiomegaly. No active cardiopulmonary disease. Electronically Signed   By: Jeannine Boga M.D.   On: 08/23/2015 22:39   Ct Angio Chest Aorta W/cm &/or Wo/cm  08/24/2015  CLINICAL DATA:  [place] today after becoming lightheaded. Was discharged from the hospital earlier today. EXAM: CT ANGIOGRAPHY CHEST, ABDOMEN AND PELVIS TECHNIQUE: Multidetector CT imaging through the  chest, abdomen and pelvis was performed using the standard protocol during bolus administration of intravenous contrast. Multiplanar reconstructed images and MIPs were obtained and reviewed to evaluate the vascular anatomy. CONTRAST:  147mL OMNIPAQUE IOHEXOL 350 MG/ML SOLN COMPARISON:  07/23/2013 FINDINGS: CTA CHEST FINDINGS The thoracic aorta is normal in caliber. There is extensive atherosclerotic plaque. There is no dissection. Proximal great vessels are patent. There is good opacification of the pulmonary arteries with no evidence of pulmonary embolism. Review of the MIP images confirms the above findings. Nonvascular chest findings include multiple small thyroid nodules, multiple nonspecific nodes in the mediastinum and both hila, and fibrotic honeycombing in the basilar lung periphery. Central airways are patent. No pleural effusions or pericardial effusions.  CTA ABDOMEN AND PELVIS FINDINGS The upper abdominal aorta is normal in caliber with extensive atherosclerotic plaque. There is mild fusiform dilatation of the infrarenal abdominal aorta to a maximum AP diameter 3.4 cm. There is mild stenosis of the celiac axis 1 cm from its origin. Superior mesenteric artery and inferior mesenteric artery are widely patent. Each kidney is served by a widely patent single main renal artery. The common iliac and external iliac arteries are patent bilaterally. No dissection of the abdominal aorta. Review of the MIP images confirms the above findings. Nonvascular findings include extensive colonic diverticulosis without evidence of diverticulitis or other acute inflammatory process. There are unremarkable arterial phase appearances of the liver, spleen, pancreas, adrenals and kidneys. There is prior cholecystectomy. Bile ducts are unremarkable. Moderate prostatic enlargement. No significant skeletal lesions. IMPRESSION: 1. Extensive atherosclerotic plaque throughout the thoracic and abdominal aorta. There is no dissection.  There is a 3.4 cm infrarenal abdominal aortic aneurysm. 2. Negative for acute pulmonary embolism. 3. Multiple tiny thyroid nodules. 4. Diverticulosis. Electronically Signed   By: Andreas Newport M.D.   On: 08/24/2015 00:34   Ct Angio Abd/pel W/ And/or W/o  08/24/2015  CLINICAL DATA:  [place] today after becoming lightheaded. Was discharged from the hospital earlier today. EXAM: CT ANGIOGRAPHY CHEST, ABDOMEN AND PELVIS TECHNIQUE: Multidetector CT imaging through the chest, abdomen and pelvis was performed using the standard protocol during bolus administration of intravenous contrast. Multiplanar reconstructed images and MIPs were obtained and reviewed to evaluate the vascular anatomy. CONTRAST:  114mL OMNIPAQUE IOHEXOL 350 MG/ML SOLN COMPARISON:  07/23/2013 FINDINGS: CTA CHEST FINDINGS The thoracic aorta is normal in caliber. There is extensive atherosclerotic plaque. There is no dissection. Proximal great vessels are patent. There is good opacification of the pulmonary arteries with no evidence of pulmonary embolism. Review of the MIP images confirms the above findings. Nonvascular chest findings include multiple small thyroid nodules, multiple nonspecific nodes in the mediastinum and both hila, and fibrotic honeycombing in the basilar lung periphery. Central airways are patent. No pleural effusions or pericardial effusions. CTA ABDOMEN AND PELVIS FINDINGS The upper abdominal aorta is normal in caliber with extensive atherosclerotic plaque. There is mild fusiform dilatation of the infrarenal abdominal aorta to a maximum AP diameter 3.4 cm. There is mild stenosis of the celiac axis 1 cm from its origin. Superior mesenteric artery and inferior mesenteric artery are widely patent. Each kidney is served by a widely patent single main renal artery. The common iliac and external iliac arteries are patent bilaterally. No dissection of the abdominal aorta. Review of the MIP images confirms the above findings.  Nonvascular findings include extensive colonic diverticulosis without evidence of diverticulitis or other acute inflammatory process. There are unremarkable arterial phase appearances of the liver, spleen, pancreas, adrenals and kidneys. There is prior cholecystectomy. Bile ducts are unremarkable. Moderate prostatic enlargement. No significant skeletal lesions. IMPRESSION: 1. Extensive atherosclerotic plaque throughout the thoracic and abdominal aorta. There is no dissection. There is a 3.4 cm infrarenal abdominal aortic aneurysm. 2. Negative for acute pulmonary embolism. 3. Multiple tiny thyroid nodules. 4. Diverticulosis. Electronically Signed   By: Andreas Newport M.D.   On: 08/24/2015 00:34    Oren Binet, MD  [facility] Pager:[phone]  If 7PM-7AM, please contact night-coverage [email or web address] Password [email or web address] 08/27/2015, 2:31 PM

## 2015-08-27 NOTE — Evaluation (Addendum)
Physical Therapy Evaluation Patient Details Name: Brett Castaneda MRN: [id number] DOB: 08-17-1941 Today's Date: 08/27/2015   History of Present Illness  74 y.o. male admitted for syncope. PMH consists of multi strokes, RA, ALS, and R eye blindness. This is pt's third hospitalization, with the first admission being 08/19/15.  Clinical Impression  Pt admitted with above diagnosis. Pt currently with functional limitations due to the deficits listed below (see PT Problem List).  Orthostatic BPs taken during eval and were negative. Pt will benefit from skilled PT to increase their independence and safety with mobility to allow discharge to the venue listed below.  Recommend SNF at d/c due to multi recent falls and hospitalizations.     Follow Up Recommendations SNF;Supervision/Assistance - 24 hour    Equipment Recommendations  None recommended by PT    Recommendations for Other Services       Precautions / Restrictions Precautions Precautions: Fall Precaution Comments: Pt with multiple falls from syncopal episodes over last week.      Mobility  Bed Mobility Overal bed mobility: Needs Assistance Bed Mobility: Supine to Sit;Sit to Supine     Supine to sit: Supervision Sit to supine: Supervision   General bed mobility comments: No physical assist. Supervision for safety.  Transfers Overall transfer level: Needs assistance Equipment used: Rolling walker (2 wheeled) Transfers: Sit to/from [facility] Sit to Stand: Min assist Stand pivot transfers: Min assist       General transfer comment: verbal cues for hand placement. Assist to power up.  Ambulation/Gait Ambulation/Gait assistance: Min assist Ambulation Distance (Feet): 10 Feet (x 2) Assistive device: Rolling walker (2 wheeled) Gait Pattern/deviations: Step-through pattern;Decreased stride length Gait velocity: Decreased Gait velocity interpretation: Below normal speed for age/gender General Gait Details:  Difficulty negotiating obstacle.   Stairs            Wheelchair Mobility    Modified Rankin (Stroke Patients Only)       Balance Overall balance assessment: History of Falls Sitting-balance support: Feet supported;No upper extremity supported Sitting balance-Leahy Scale: Good     Standing balance support: During functional activity;Bilateral upper extremity supported Standing balance-Leahy Scale: Fair                               Pertinent Vitals/Pain Pain Assessment: 0-10 Pain Score: 5  Pain Location: chronic back pain Pain Descriptors / Indicators: Sore Pain Intervention(s): Monitored during session    Home Living Family/patient expects to be discharged to:: Private residence Living Arrangements: Spouse/significant other Available Help at Discharge: Family;Available 24 hours/day Type of Home: House Home Access: Stairs to enter Entrance Stairs-Rails: None Entrance Stairs-Number of Steps: 1 Home Layout: One level Home Equipment: Walker - 2 wheels;Cane - single point;Walker - 4 wheels      Prior Function Level of Independence: Independent with assistive device(s)         Comments: Pt does not drive, but does do yard work at times [employer] at home w/o AD.  Uses rollator when out in community     Hand Dominance   Dominant Hand: Right    Extremity/Trunk Assessment                      Cervical / Trunk Assessment: Kyphotic  Communication   Communication: No difficulties  Cognition Arousal/Alertness: Awake/alert Behavior During Therapy: Agitated Overall Cognitive Status: History of cognitive impairments - at baseline  General Comments      Exercises        Assessment/Plan    PT Assessment Patient needs continued PT services  PT Diagnosis Difficulty walking;Generalized weakness   PT Problem List Decreased strength;Decreased range of motion;Decreased activity tolerance;Decreased  balance;Decreased mobility;Decreased knowledge of use of DME;Decreased safety awareness;Decreased knowledge of precautions;Decreased cognition;Impaired sensation  PT Treatment Interventions DME instruction;Gait training;Stair training;Functional mobility training;Therapeutic activities;Therapeutic exercise;Balance training;Neuromuscular re-education;Cognitive remediation;Patient/family education   PT Goals (Current goals can be found in the Care Plan section) Acute Rehab PT Goals Patient Stated Goal: find out what's going on PT Goal Formulation: With patient Time For Goal Achievement: 09/10/15 Potential to Achieve Goals: Good    Frequency Min 3X/week   Barriers to discharge        Co-evaluation               End of Session Equipment Utilized During Treatment: Gait belt Activity Tolerance: Patient tolerated treatment well Patient left: in bed;with call bell/phone within reach;with bed alarm set Nurse Communication: Mobility status    Functional Assessment Tool Used: clinical judgement Functional Limitation: Mobility: Walking and moving around Mobility: Walking and Moving Around Current Status [id number]): At least 20 percent but less than 40 percent impaired, limited or restricted Mobility: Walking and Moving Around Goal Status [phone]): At least 1 percent but less than 20 percent impaired, limited or restricted    Time: 1534-1605 PT Time Calculation (min) (ACUTE ONLY): 31 min   Charges:   PT Evaluation $Initial PT Evaluation Tier I: 1 Procedure PT Treatments $Therapeutic Activity: 8-22 mins   PT G Codes:   PT G-Codes **NOT FOR INPATIENT CLASS** Functional Assessment Tool Used: clinical judgement Functional Limitation: Mobility: Walking and moving around Mobility: Walking and Moving Around Current Status [id number]): At least 20 percent but less than 40 percent impaired, limited or restricted Mobility: Walking and Moving Around Goal Status [phone]): At least 1 percent but less  than 20 percent impaired, limited or restricted    Lorriane Shire 08/27/2015, 4:44 PM

## 2015-08-27 NOTE — Progress Notes (Signed)
Pt temp 101.9. Tylenol given. Dr.G notified.

## 2015-08-28 DIAGNOSIS — R509 Fever, unspecified: Secondary | ICD-10-CM

## 2015-08-28 DIAGNOSIS — A329 Listeriosis, unspecified: Secondary | ICD-10-CM | POA: Diagnosis not present

## 2015-08-28 DIAGNOSIS — K5900 Constipation, unspecified: Secondary | ICD-10-CM

## 2015-08-28 DIAGNOSIS — Z87891 Personal history of nicotine dependence: Secondary | ICD-10-CM

## 2015-08-28 DIAGNOSIS — R7881 Bacteremia: Secondary | ICD-10-CM | POA: Diagnosis not present

## 2015-08-28 DIAGNOSIS — Z8619 Personal history of other infectious and parasitic diseases: Secondary | ICD-10-CM

## 2015-08-28 DIAGNOSIS — I639 Cerebral infarction, unspecified: Secondary | ICD-10-CM | POA: Diagnosis not present

## 2015-08-28 LAB — CBC
HEMATOCRIT: 35.5 % — AB (ref 39.0–52.0)
Hemoglobin: 11.9 g/dL — ABNORMAL LOW (ref 13.0–17.0)
MCH: 30.7 pg (ref 26.0–34.0)
MCHC: 33.5 g/dL (ref 30.0–36.0)
MCV: 91.5 fL (ref 78.0–100.0)
Platelets: 219 10*3/uL (ref 150–400)
RBC: 3.88 MIL/uL — ABNORMAL LOW (ref 4.22–5.81)
RDW: 13.8 % (ref 11.5–15.5)
WBC: 6.6 10*3/uL (ref 4.0–10.5)

## 2015-08-28 LAB — BASIC METABOLIC PANEL
Anion gap: 7 (ref 5–15)
BUN: 9 mg/dL (ref 6–20)
CALCIUM: 8.1 mg/dL — AB (ref 8.9–10.3)
CO2: 22 mmol/L (ref 22–32)
CREATININE: 1.07 mg/dL (ref 0.61–1.24)
Chloride: 101 mmol/L (ref 101–111)
GFR calc Af Amer: >60 mL/min (ref >60)
GFR calc non Af Amer: >60 mL/min (ref >60)
GLUCOSE: 124 mg/dL — AB (ref 65–99)
Potassium: 3.5 mmol/L (ref 3.5–5.1)
Sodium: 130 mmol/L — ABNORMAL LOW (ref 135–145)

## 2015-08-28 LAB — URINE CULTURE

## 2015-08-28 MED ORDER — VANCOMYCIN HCL IN DEXTROSE 1-5 GM/200ML-% IV SOLN
1000.0000 mg | Freq: Two times a day (BID) | INTRAVENOUS | Status: DC
  Administered 2015-08-29 – 2015-08-30 (×3): 1000 mg via INTRAVENOUS
  Filled 2015-08-28 (×4): qty 200

## 2015-08-28 MED ORDER — VANCOMYCIN HCL 10 G IV SOLR
1750.0000 mg | Freq: Once | INTRAVENOUS | Status: AC
  Administered 2015-08-28: 1750 mg via INTRAVENOUS
  Filled 2015-08-28: qty 1750

## 2015-08-28 MED ORDER — MECLIZINE HCL 25 MG PO TABS: 25.0000 mg | ORAL_TABLET | Freq: Three times a day (TID) | ORAL | Status: DC | PRN

## 2015-08-28 MED ORDER — PIPERACILLIN-TAZOBACTAM 3.375 G IVPB
3.3750 g | Freq: Three times a day (TID) | INTRAVENOUS | Status: DC
Start: 2015-08-28 — End: 2015-08-30
  Administered 2015-08-28 – 2015-08-30 (×6): 3.375 g via INTRAVENOUS
  Filled 2015-08-28 (×8): qty 50

## 2015-08-28 NOTE — Progress Notes (Signed)
Lab called critical lab, blood cultures +    CRITICAL VALUE ALERT  Critical value received:  From lab  Date of notification:  08/28/15  Time of notification:  7:56  Critical value read back:Yes.    Nurse who received alert:  Aleda Grana  MD notified (1st page):  ghimire  Time of first page:  7:57   Responding MD: ghimire  Time MD responded:  7:58  Gram variable rod

## 2015-08-28 NOTE — Clinical Social Work Note (Signed)
Clinical Social Work Assessment  Patient Details  Name: Brett Castaneda MRN: [id number] Date of Birth: 07/07/1941  Date of referral:  08/28/15               Reason for consult:  Facility Placement                Permission sought to share information with:  Facility Sport and exercise psychologist, Family Supports Permission granted to share information::  Yes, Verbal Permission Granted  Name::     Weir Croff patient's spouse  Agency::  SNF admissions  Relationship::     Contact Information:     Housing/Transportation Living arrangements for the past 2 months:  Single Family Home Source of Information:  Patient, Spouse Patient Interpreter Needed:  None Criminal Activity/Legal Involvement Pertinent to Current Situation/Hospitalization:  No - Comment as needed Significant Relationships:  Adult Children, Spouse Lives with:  Spouse Do you feel safe going back to the place where you live?  Yes Need for family participation in patient care:  Yes (Comment) (Patient requests his wife to help make decisions)  Care giving concerns:  Patient and family feel patient needs some short term rehab before he can go home.   Social Worker assessment / plan:  Patient is a 74 year old male who lives with his wife who was at bedside.  Patient is alert and oriented x3 and pleasant to talk to.  Patient states he has never been to rehab before, CSW explained SNF search process and what to expect at a SNF for short term rehab. CSW explained how insurance will pay for stay and what to expect for discharge planning from SNF.  Patient and family asked questions which CSW answered, patient and family did not express any other questions or concerns.  Employment status:  Retired Nurse, adult PT Recommendations:  [place] / Referral to community resources:  [place]  Patient/Family's Response to care:  Patient and family agreeable to going to SNF for  short term rehab.  Patient/Family's Understanding of and Emotional Response to Diagnosis, Current Treatment, and Prognosis:  Patient and family aware of current prognosis and treatment plan  Emotional Assessment Appearance:  Appears stated age Attitude/Demeanor/Rapport:    Affect (typically observed):  Appropriate, Pleasant, Calm Orientation:  Oriented to  Time, Oriented to Place, Oriented to Self Alcohol / Substance use:  Not Applicable Psych involvement (Current and /or in the community):  No (Comment)  Discharge Needs  Concerns to be addressed:  No discharge needs identified Readmission within the last 30 days:  No Current discharge risk:  None Barriers to Discharge:  No Barriers Identified   Ross Ludwig, LCSWA 08/28/2015, 6:09 PM

## 2015-08-28 NOTE — Progress Notes (Signed)
PATIENT DETAILS Name: Brett Castaneda Age: 74 y.o. Sex: male Date of Birth: [birth year] Admit Date: 08/26/2015 Admitting Physician Theressa Millard, MD [id number] [name], MD  Subjective: No dizziness noted on sitting up, back pain much better.   Assessment/Plan: Principal Problem: Recurrent Syncope: EKG/telemetry negative. Orthostatics on admission negative.Loop recorder interrogation negative for arrhythmias.Recent transthoracic echocardiogram on 11/26 showed preserved ejection fraction.I suspect that this is most likely related to fever/bacteremia/neuropathy issues at this time. ontinue telemetry monitoring. Follow  Active Problems: Bacteremia: Febrile this admission-One set of blood culture positive for gram variable rod. UA negative for UTI, chest x-ray negative for pneumoniaGiven recent CVA-that was thought to be embolic-is this septic emboli. Repeat cultures-ID consulted-will wait for ID recommendations before starting Abx-as stable.   Recent CVA: Nonfocal exam. Continue aspirin, statin. 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 Await ID eval-?TEE.  Back Pain: much better today-suspect this was secondary to fall/syncope-(hit his back) that occurred on admission. Much better with supportive measures-however if continues-given bacteremia-may need to scan his back  Scalp laceration: Secondary to fall-remove sutures in 1 week  Hypertension: Controlled-continue amlodipine  Dyslipidemia: Continue statin  History of unspecified peripheral neuropathy:? Related to methotrexate  ? ALS: Reviewed prior note from Dr. Leonie Man on 11/29-he doubts ALS.  History of rheumatoid arthritis: Hold methotrexate-given fever  Deconditioning/frequent falls: PT evaluation- SNF placement.Consult social workl  Disposition: Remain inpatient-await further work up-SNF on discharge  Antimicrobial agents  See  below  Anti-infectives    None      DVT Prophylaxis: Prophylactic Lovenox  Code Status:  DNR  Family Communication None  at bedside  Procedures: None  CONSULTS:  None  Time spent 30 minutes-Greater than 50% of this time was spent in counseling, explanation of diagnosis, planning of further management, and coordination of care.  MEDICATIONS: Scheduled Meds: . amLODipine  5 mg Oral Daily  . aspirin EC  325 mg Oral Daily  . enoxaparin (LOVENOX) injection  40 mg Subcutaneous Q24H  . folic acid-pyridoxine-cyancobalamin  1 tablet Oral Q Sat  . latanoprost  1 drop Left Eye QHS  . multivitamin-lutein  1 capsule Oral Daily  . pantoprazole  40 mg Oral Q1200  . simvastatin  40 mg Oral q1800  . sodium chloride  3 mL Intravenous Q12H  . timolol  1 drop Left Eye QPM   Continuous Infusions: . sodium chloride 50 mL/hr at 08/28/15 0455   PRN Meds:.acetaminophen **OR** acetaminophen, alum & mag hydroxide-simeth, hydrALAZINE, HYDROmorphone (DILAUDID) injection, ibuprofen, meclizine, ondansetron **OR** ondansetron (ZOFRAN) IV, oxyCODONE, polyethylene glycol    PHYSICAL EXAM: Vital signs in last 24 hours: Filed Vitals:   08/28/15 0505 08/28/15 0535 08/28/15 0606 08/28/15 0754  BP: 164/96 153/90  150/92  Pulse: 106 89  84  Temp: 100 F (37.8 C)  99.7 F (37.6 C) 97.4 F (36.3 C)  TempSrc:   Oral Oral  Resp: 20 18  19   Height:      Weight: 76.204 kg (168 lb)     SpO2: 95% 96%  97%    Weight change: -0.544 kg (-1 lb 3.2 oz) Filed Weights   08/27/15 0046 08/28/15 0505  Weight: 76.749 kg (169 lb 3.2 oz) 76.204 kg (168 lb)   Body mass index is 23.44 kg/(m^2).   Gen Exam: Awake and alert with clear speech. Not in any distress-able to sit up without any dizziness or back  pain Neck: Supple, No JVD.   Chest: B/L Clear.   CVS: S1 S2 Regular, no murmurs.  Abdomen: soft, BS +, non tender, non distended.  Extremities: no edema, lower extremities warm to touch. Neurologic: Non  Focal-5/5 in all 4 extremities  Skin: No Rash.   Wounds: N/A.   Intake/Output from previous day:  Intake/Output Summary (Last 24 hours) at 08/28/15 0947 Last data filed at 08/28/15 0835  Gross per 24 hour  Intake 2182.09 ml  Output   2125 ml  Net  57.09 ml     LAB RESULTS: CBC  Recent Labs Lab 08/23/15 2202 08/24/15 0427 08/26/15 1502 08/27/15 0634 08/28/15 0816  WBC 9.5 6.7 4.5 5.4 6.6  HGB 12.5* 11.0* 12.5* 12.3* 11.9*  HCT 37.0* 32.8* 36.5* 36.9* 35.5*  PLT 262 212 256 234 219  MCV 93.0 92.4 92.4 92.7 91.5  MCH 31.4 31.0 31.6 30.9 30.7  MCHC 33.8 33.5 34.2 33.3 33.5  RDW 13.6 13.6 13.8 13.7 13.8  LYMPHSABS  --  1.4 1.1  --   --   MONOABS  --  0.3 0.3  --   --   EOSABS  --  0.1 0.1  --   --   BASOSABS  --  0.0 0.0  --   --     Chemistries   Recent Labs Lab 08/23/15 2207 08/24/15 0427 08/24/15 1017 08/26/15 1502 08/27/15 0634 08/28/15 0816  NA 133* 135  --  132* 134* 130*  K 4.6 4.2  --  3.6 3.9 3.5  CL 102 107  --  100* 103 101  CO2 22 22  --  24 25 22   GLUCOSE 148* 109*  --  124* 105* 124*  BUN 16 12  --  11 8 9   CREATININE 1.39* 1.13  --  1.17 1.12 1.07  CALCIUM 8.5* 7.9*  --  8.4* 8.3* 8.1*  MG  --   --  1.8  --   --   --     CBG: No results for input(s): GLUCAP in the last 168 hours.  GFR Estimated Creatinine Clearance: 66.5 mL/min (by C-G formula based on Cr of 1.07).  Coagulation profile  Recent Labs Lab 08/23/15 2202  INR 1.11    Cardiac Enzymes  Recent Labs Lab 08/27/15 0135 08/27/15 0634 08/27/15 1255  TROPONINI 0.03 0.03 0.03    Invalid input(s): POCBNP No results for input(s): DDIMER in the last 72 hours. No results for input(s): HGBA1C in the last 72 hours. No results for input(s): CHOL, HDL, LDLCALC, TRIG, CHOLHDL, LDLDIRECT in the last 72 hours. No results for input(s): TSH, T4TOTAL, T3FREE, THYROIDAB in the last 72 hours.  Invalid input(s): FREET3 No results for input(s): VITAMINB12, FOLATE, FERRITIN, TIBC,  IRON, RETICCTPCT in the last 72 hours. No results for input(s): LIPASE, AMYLASE in the last 72 hours.  Urine Studies No results for input(s): UHGB, CRYS in the last 72 hours.  Invalid input(s): UACOL, UAPR, USPG, UPH, UTP, UGL, UKET, UBIL, UNIT, UROB, ULEU, UEPI, UWBC, URBC, UBAC, CAST, UCOM, BILUA  MICROBIOLOGY: Recent Results (from the past 240 hour(s))  Blood culture (routine x 2)     Status: None   Collection Time: 08/19/15  9:45 PM  Result Value Ref Range Status   Specimen Description BLOOD RIGHT FOREARM  Final   Special Requests IN PEDIATRIC BOTTLE [place]  Final   Culture NO GROWTH 5 DAYS  Final   Report Status 08/24/2015 FINAL  Final  Blood culture (routine x 2)  Status: None   Collection Time: 08/19/15  9:51 PM  Result Value Ref Range Status   Specimen Description BLOOD RIGHT HAND  Final   Special Requests BOTTLES DRAWN AEROBIC AND ANAEROBIC 5CC  Final   Culture NO GROWTH 5 DAYS  Final   Report Status 08/24/2015 FINAL  Final  Culture, blood (routine x 2)     Status: None (Preliminary result)   Collection Time: 08/27/15  9:43 AM  Result Value Ref Range Status   Specimen Description BLOOD RIGHT WRIST  Final   Special Requests BOTTLES DRAWN AEROBIC AND ANAEROBIC 10CC  Final   Culture  Setup Time   Final    GRAM VARIABLE ROD AEROBIC BOTTLE ONLY CRITICAL RESULT CALLED TO, READ BACK BY AND VERIFIED WITH: J. ODDONO,RN AT [id number] ON [id number] BY Rhea Bleacher    Culture CULTURE REINCUBATED FOR BETTER GROWTH  Final   Report Status PENDING  Incomplete    RADIOLOGY STUDIES/RESULTS: Dg Chest 2 View  08/19/2015  CLINICAL DATA:  Stroke symptoms tonight. Fever. Hypertension. Ex-smoker. EXAM: CHEST  2 VIEW COMPARISON:  10/28/2013 FINDINGS: Mild cardiac enlargement without vascular congestion. Interstitial changes in the lung bases similar to previous study, likely fibrosis. No focal airspace disease. No blunting of costophrenic angles. No pneumothorax. Calcified and tortuous aorta. Surgical  clips in the right upper quadrant. 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 Mild cardiac enlargement. No evidence of active pulmonary disease. Electronically Signed   By: Lucienne Capers M.D.   On: 08/19/2015 23:02   Ct Head Wo Contrast  08/24/2015  CLINICAL DATA:  74 year old male with syncope. EXAM: CT HEAD WITHOUT CONTRAST TECHNIQUE: Contiguous axial images were obtained from the base of the skull through the vertex without intravenous contrast. COMPARISON:  Brain CT and MRI dated 08/19/2015 FINDINGS: The ventricles are dilated and the sulci are prominent compatible with age-related atrophy. Periventricular and deep white matter hypodensities represent chronic microvascular ischemic changes. Stable right frontal lobe old infarct and encephalomalacia. There is no intracranial hemorrhage. No mass effect or midline shift identified. The visualized paranasal sinuses and mastoid air cells are well aerated. The calvarium is intact. There is a stable calcific changes of the right globe. IMPRESSION: No acute intracranial pathology. Age-related atrophy and chronic microvascular ischemic disease. Stable right frontal lobe old infarct and encephalomalacia. If symptoms persist and there are no contraindications, MRI may provide better evaluation if clinically indicated. Electronically Signed   By: Anner Crete M.D.   On: 08/24/2015 00:29   Ct Head Wo Contrast  08/19/2015  CLINICAL DATA:  Altered mental status.  No weakness.  Code stroke. EXAM: CT HEAD WITHOUT CONTRAST TECHNIQUE: Contiguous axial images were obtained from the base of the skull through the vertex without intravenous contrast. COMPARISON:  07/16/2013 FINDINGS: Diffuse cerebral atrophy. No ventricular dilatation. Low-attenuation changes in the deep white matter consistent with small vessel ischemia. Focal encephalomalacia in the right frontal lobe consistent with old infarct. No mass effect or midline shift. No abnormal extra-axial fluid collections.  Gray-white matter junctions are distinct. Basal cisterns are not effaced. No evidence of acute intracranial hemorrhage. No depressed skull fractures. Mild mucosal thickening in the paranasal sinuses. Mastoid air cells are not opacified. Vascular calcifications. Calcification and deformity of the right globe consistent with old insult. Focal increased density in the left globe likely postoperative. IMPRESSION: No acute intracranial abnormalities. Chronic atrophy and small vessel ischemic changes. Old right frontal infarct. These results were called by telephone at the time of interpretation on 08/19/2015 at 8:38  pm to Dr. Leonel Ramsay, who verbally acknowledged these results. Electronically Signed   By: Lucienne Capers M.D.   On: 08/19/2015 20:41   [name]  08/19/2015  CLINICAL DATA:  Initial valuation for acute altered mental status. EXAM: MRI HEAD WITHOUT CONTRAST TECHNIQUE: Multiplanar, multiecho pulse sequences of the brain and surrounding structures were obtained without intravenous contrast. COMPARISON:  Prior head CT from earlier the same day P FINDINGS: Diffusion-weighted sequences demonstrate a possible tiny 3 mm cortical infarct within the left temporal lobe (series 3, image 20). No associated edema or hemorrhage. No other infarct. Largely absent flow void within the diminutive right vertebral artery, stable. Major intracranial vascular flow voids are otherwise maintained. Diffuse prominence of the CSF containing spaces is compatible with generalized age-related cerebral atrophy. Mild chronic small vessel ischemic disease in within the periventricular white matter. Encephalomalacia within the high right frontal lobe consistent with remote ischemic infarct. There is associated gliosis within this region. Multiple additional remote lacunar infarcts within the right basal ganglia involving both the caudate and lentiform nuclei. No mass lesion, midline shift, or mass effect. No hydrocephalus.  No extra-axial fluid collection. Pituitary gland normal. No acute abnormality about the globes. 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 Mild mucosal thickening within the ethmoidal air cells and maxillary sinuses. No mastoid effusion. Inner ear structures normal. Bone marrow signal intensity within normal limits. No scalp soft tissue abnormality. IMPRESSION: 1. 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 2. No other acute intracranial process identified. 3. Moderate to large remote infarct within the posterior right frontal lobe with extension into the operculum. Additional remote lacunar infarcts involving the right basal ganglia. 4. Nonvisualization of the right vertebral artery, which may be occluded. This is stable relative to previous MRI from 2014. 5. Atrophy with mild chronic small vessel ischemic disease. Electronically Signed   By: Jeannine Boga M.D.   On: 08/19/2015 23:14   Dg Chest Port 1 View  08/27/2015  CLINICAL DATA:  Fever with back pain. History of ALS, CVA and rheumatoid arthritis. EXAM: PORTABLE CHEST 1 VIEW COMPARISON:  CT and radiographs 08/23/2015. FINDINGS: 0831 hours. There are lower lung volumes. Patient has chronic fibrotic changes at both lung bases with probable mild superimposed atelectasis related to the lower lung volumes. There is no consolidation or significant pleural effusion. The heart size and mediastinal contours are stable. IMPRESSION: Stable fibrotic changes at both lung bases with mild superimposed atelectasis. No evidence of pneumonia. Electronically Signed   By: Richardean Sale M.D.   On: 08/27/2015 10:45   Dg Chest Portable 1 View  08/23/2015  CLINICAL DATA:  Initial evaluation for 2 episodes of syncope today, nausea, weakness. EXAM: PORTABLE CHEST 1 VIEW COMPARISON:  Prior radiograph from 08/19/2015. FINDINGS: Cardiomegaly is stable from prior. Mediastinal  silhouette within normal limits. Tortuosity intrathoracic aorta with atheromatous plaque within the aortic arch noted, stable. Lungs are hypoinflated with bibasilar fibrotic changes, left greater than right Ing, stable. No focal infiltrates. No pulmonary edema or pleural effusion. No pneumothorax. No acute osseus abnormality. IMPRESSION: Stable appearance of the chest with left greater than right bibasilar fibrosis and mild cardiomegaly. No active cardiopulmonary disease. Electronically Signed   By: Jeannine Boga M.D.   On: 08/23/2015 22:39   Ct Angio Chest Aorta W/cm &/or Wo/cm  08/24/2015  CLINICAL DATA:  [place] today after becoming lightheaded. Was discharged from the hospital earlier today. EXAM: CT  ANGIOGRAPHY CHEST, ABDOMEN AND PELVIS TECHNIQUE: Multidetector CT imaging through the chest, abdomen and pelvis was performed using the standard protocol during bolus administration of intravenous contrast. Multiplanar reconstructed images and MIPs were obtained and reviewed to evaluate the vascular anatomy. CONTRAST:  18mL OMNIPAQUE IOHEXOL 350 MG/ML SOLN COMPARISON:  07/23/2013 FINDINGS: CTA CHEST FINDINGS The thoracic aorta is normal in caliber. There is extensive atherosclerotic plaque. There is no dissection. Proximal great vessels are patent. There is good opacification of the pulmonary arteries with no evidence of pulmonary embolism. Review of the MIP images confirms the above findings. Nonvascular chest findings include multiple small thyroid nodules, multiple nonspecific nodes in the mediastinum and both hila, and fibrotic honeycombing in the basilar lung periphery. Central airways are patent. No pleural effusions or pericardial effusions. CTA ABDOMEN AND PELVIS FINDINGS The upper abdominal aorta is normal in caliber with extensive atherosclerotic plaque. There is mild fusiform dilatation of the infrarenal abdominal aorta to a maximum AP diameter 3.4 cm. There is mild stenosis of the celiac axis 1  cm from its origin. Superior mesenteric artery and inferior mesenteric artery are widely patent. Each kidney is served by a widely patent single main renal artery. The common iliac and external iliac arteries are patent bilaterally. No dissection of the abdominal aorta. Review of the MIP images confirms the above findings. Nonvascular findings include extensive colonic diverticulosis without evidence of diverticulitis or other acute inflammatory process. There are unremarkable arterial phase appearances of the liver, spleen, pancreas, adrenals and kidneys. There is prior cholecystectomy. Bile ducts are unremarkable. Moderate prostatic enlargement. No significant skeletal lesions. IMPRESSION: 1. Extensive atherosclerotic plaque throughout the thoracic and abdominal aorta. There is no dissection. There is a 3.4 cm infrarenal abdominal aortic aneurysm. 2. Negative for acute pulmonary embolism. 3. Multiple tiny thyroid nodules. 4. Diverticulosis. Electronically Signed   By: Andreas Newport M.D.   On: 08/24/2015 00:34   Ct Angio Abd/pel W/ And/or W/o  08/24/2015  CLINICAL DATA:  [place] today after becoming lightheaded. Was discharged from the hospital earlier today. EXAM: CT ANGIOGRAPHY CHEST, ABDOMEN AND PELVIS TECHNIQUE: Multidetector CT imaging through the chest, abdomen and pelvis was performed using the standard protocol during bolus administration of intravenous contrast. Multiplanar reconstructed images and MIPs were obtained and reviewed to evaluate the vascular anatomy. CONTRAST:  161mL OMNIPAQUE IOHEXOL 350 MG/ML SOLN COMPARISON:  07/23/2013 FINDINGS: CTA CHEST FINDINGS The thoracic aorta is normal in caliber. There is extensive atherosclerotic plaque. There is no dissection. Proximal great vessels are patent. There is good opacification of the pulmonary arteries with no evidence of pulmonary embolism. Review of the MIP images confirms the above findings. Nonvascular chest findings include multiple small  thyroid nodules, multiple nonspecific nodes in the mediastinum and both hila, and fibrotic honeycombing in the basilar lung periphery. Central airways are patent. No pleural effusions or pericardial effusions. CTA ABDOMEN AND PELVIS FINDINGS The upper abdominal aorta is normal in caliber with extensive atherosclerotic plaque. There is mild fusiform dilatation of the infrarenal abdominal aorta to a maximum AP diameter 3.4 cm. There is mild stenosis of the celiac axis 1 cm from its origin. Superior mesenteric artery and inferior mesenteric artery are widely patent. Each kidney is served by a widely patent single main renal artery. The common iliac and external iliac arteries are patent bilaterally. No dissection of the abdominal aorta. Review of the MIP images confirms the above findings. Nonvascular findings include extensive colonic diverticulosis without evidence of diverticulitis or other acute inflammatory process. There are  unremarkable arterial phase appearances of the liver, spleen, pancreas, adrenals and kidneys. There is prior cholecystectomy. Bile ducts are unremarkable. Moderate prostatic enlargement. No significant skeletal lesions. IMPRESSION: 1. Extensive atherosclerotic plaque throughout the thoracic and abdominal aorta. There is no dissection. There is a 3.4 cm infrarenal abdominal aortic aneurysm. 2. Negative for acute pulmonary embolism. 3. Multiple tiny thyroid nodules. 4. Diverticulosis. Electronically Signed   By: Andreas Newport M.D.   On: 08/24/2015 00:34    Oren Binet, MD  [facility] Pager:[phone]  If 7PM-7AM, please contact night-coverage [email or web address] Password [facility] 08/28/2015, 9:47 AM

## 2015-08-28 NOTE — Consult Note (Addendum)
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   Thank you so much for this interesting consult,   [name] (pager) [phone] [email or web address]  Brett Castaneda is an 74 y.o. male.  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 At his last CVA (11-26 to 11-29) he had imaging which revealed small L parietal lobe ischemic infarct.  He did not receive anti-coagulation (outside of ASA and plavix) or TPA.  He returned 11-30 after a syncopal episode. He was noted to be hypertensive in hospital. He had an implantable loop recorder placed.  He returned 12-2 with worsening dizziness. He also had a syncopal episode, fell and hit his head.  In hospital he developed temp 101. His BCx has since grown a gram variable rod. His UA was (-), his CXR showed fibrotic changes, no pna.   Past Medical History  Diagnosis Date  . BASAL CELL CARCINOMA, FACE 03/31/2009  . 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 CONSTIPATION 06/22/2008  . DIVERTICULOSIS, COLON 03/24/2007  . GERD 03/24/2007  . HYPERLIPIDEMIA 08/27/2007  . HYPERTENSION 12/14/2008  . Rheumatoid arthritis(714.0) 03/24/2007  . Blind right eye   . Retinal detachment     hx of  . Keratosis Oct. 2013  . Fall at home Nov. 5, 2014  06-28-13    in the home and Outside as well  . 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 Stroke [facility]) Oct 2014  . 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 Total knee arthroplasty      bital  . Carpal tunnel release Right 2003  . Cataract extraction  1980    removed cataract in left eye. Currently blind in right eye  . Cholecystectomy  2001    Gall Bladder  . Retinal detachment surgery  2004  . Lumbar puncture  Oct. 21, 2014  . Nerve conduction    . Mri  07-14-13  . Carotid endarterectomy Right 08-15-07    cea  . Joint replacement  2000    Right knee replacement  . Eye surgery  3614,4315    retinal detachment  . Eye surgery      left cataract surgery  . Shoulder surgery Left 2009    hx "frozen" shoulder  . Ep implantable device N/A 08/25/2015    Procedure: Loop Recorder Insertion;  Surgeon: Will Meredith Leeds, MD;  Location: [place] [facility];  Service: Cardiovascular;  Laterality: N/A;     Allergies  Allergen Reactions  . Dopamine Anaphylaxis  . Morphine And Related Other (See Comments)    Hallucinations, very bad reactions  . Vicodin [Hydrocodone-Acetaminophen] Nausea And Vomiting    Medications:  Scheduled: . amLODipine  5 mg Oral Daily  . aspirin EC  325 mg Oral Daily  . enoxaparin (LOVENOX) injection  40 mg Subcutaneous Q24H  . folic acid-pyridoxine-cyancobalamin  1 tablet Oral Q Sat  . latanoprost  1 drop Left Eye QHS  . multivitamin-lutein  1 capsule Oral Daily  . pantoprazole  40 mg Oral Q1200  . simvastatin  40 mg Oral q1800  . sodium chloride  3 mL Intravenous Q12H  . timolol  1 drop Left Eye QPM    Abtx:  Anti-infectives    None      Total days of antibiotics: 0          Social History:  reports that he quit smoking about 34 years ago. His smoking use included Cigarettes. He quit after 23 years of use. He has never used smokeless tobacco. He reports that he does not drink alcohol or use illicit drugs.  Family History  Problem Relation Age of Onset  . Stroke Mother 61  . Hypertension Mother   . Heart disease Father   . Aneurysm Father   . Coronary  artery disease Sister   . Pneumonia Sister   . Heart disease Sister   . Diabetes Sister   . Hypertension Sister   . Colon cancer Neg Hx     General ROS: + constipation, no dysuria.  Please see HPI. 12 point ROS o/w (-)  Blood pressure 150/92, pulse 84, temperature 97.4 F (36.3 C), temperature source Oral, resp. rate 19, height $RemoveBe'5\' 11"'uxzjoMnag$  (1.803 m), weight 76.204 kg (168 lb), SpO2 97 %. General appearance: alert, cooperative and no distress Eyes: r eye opaque.  Throat: normal findings: oropharynx pink & moist without lesions or evidence of thrush Neck: no adenopathy and supple, symmetrical, trachea midline Lungs: clear to auscultation bilaterally Chest wall: no tenderness, wound on L mid chest, clean, dressed.  Heart: regular rate and rhythm Abdomen: normal findings: bowel sounds normal and soft, non-tender Extremities: edema none. no nail bed lesions. Neurologic: Motor: 5/5 UE and LE Sutures on scalp are clean.   Results for orders placed or performed during the hospital encounter of 08/26/15 (from the past 48 hour(s))  CBC with Differential     Status: Abnormal   Collection Time: 08/26/15  3:02 PM  Result Value Ref Range   WBC 4.5 4.0 - 10.5 K/uL   RBC 3.95 (L) 4.22 - 5.81 MIL/uL   Hemoglobin 12.5 (L) 13.0 - 17.0 g/dL   HCT 36.5 (L) 39.0 - 52.0 %   MCV 92.4 78.0 - 100.0 fL   MCH 31.6 26.0 - 34.0 pg   MCHC 34.2 30.0 - 36.0 g/dL   RDW 13.8 11.5 - 15.5 %   Platelets 256 150 - 400 K/uL   Neutrophils Relative % 68 %   Neutro Abs 3.1 1.7 - 7.7 K/uL   Lymphocytes Relative 24 %   Lymphs Abs 1.1 0.7 - 4.0 K/uL   Monocytes Relative 7 %   Monocytes Absolute 0.3 0.1 - 1.0 K/uL   Eosinophils Relative 1 %   Eosinophils Absolute 0.1 0.0 - 0.7 K/uL   Basophils Relative 0 %   Basophils Absolute 0.0 0.0 - 0.1 K/uL  Basic metabolic panel     Status: Abnormal   Collection Time: 08/26/15  3:02 PM  Result Value Ref Range   Sodium 132 (L) 135 - 145 mmol/L   Potassium 3.6 3.5 - 5.1 mmol/L     Chloride 100 (L) 101 - 111 mmol/L   CO2 24 22 - 32 mmol/L   Glucose, Bld 124 (H) 65 - 99 mg/dL   BUN 11 6 - 20 mg/dL   Creatinine, Ser 1.17 0.61 - 1.24 mg/dL   Calcium 8.4 (L) 8.9 -  10.3 mg/dL   GFR calc non Af Amer >60 >60 mL/min   GFR calc Af Amer >60 >60 mL/min    Comment: (NOTE) The eGFR has been calculated using the CKD EPI equation. This calculation has not been validated in all clinical situations. eGFR's persistently <60 mL/min signify possible Chronic Kidney Disease.    Anion gap 8 5 - 15  Urinalysis, Routine w reflex microscopic (not at [facility])     Status: None   Collection Time: 08/26/15  4:19 PM  Result Value Ref Range   Color, Urine YELLOW YELLOW   APPearance CLEAR CLEAR   Specific Gravity, Urine 1.012 1.005 - 1.030   pH 7.0 5.0 - 8.0   Glucose, UA NEGATIVE NEGATIVE mg/dL   Hgb urine dipstick NEGATIVE NEGATIVE   Bilirubin Urine NEGATIVE NEGATIVE   Ketones, ur NEGATIVE NEGATIVE mg/dL   Protein, ur NEGATIVE NEGATIVE mg/dL   Nitrite NEGATIVE NEGATIVE   Leukocytes, UA NEGATIVE NEGATIVE    Comment: MICROSCOPIC NOT DONE ON URINES WITH NEGATIVE PROTEIN, BLOOD, LEUKOCYTES, NITRITE, OR GLUCOSE <1000 mg/dL.  I-Stat Troponin, ED (not at [facility])     Status: None   Collection Time: 08/26/15  5:46 PM  Result Value Ref Range   Troponin i, poc 0.03 0.00 - 0.08 ng/mL   Comment 3            Comment: Due to the release kinetics of cTnI, a negative result within the first hours of the onset of symptoms does not rule out myocardial infarction with certainty. If myocardial infarction is still suspected, repeat the test at appropriate intervals.   Troponin I (q 6hr x 3)     Status: None   Collection Time: 08/27/15  1:35 AM  Result Value Ref Range   Troponin I 0.03 <0.031 ng/mL    Comment:        NO INDICATION OF MYOCARDIAL INJURY.   Troponin I (q 6hr x 3)     Status: None   Collection Time: 08/27/15  6:34 AM  Result Value Ref Range   Troponin I 0.03 <0.031 ng/mL    Comment:         NO INDICATION OF MYOCARDIAL INJURY.   Basic metabolic panel     Status: Abnormal   Collection Time: 08/27/15  6:34 AM  Result Value Ref Range   Sodium 134 (L) 135 - 145 mmol/L   Potassium 3.9 3.5 - 5.1 mmol/L   Chloride 103 101 - 111 mmol/L   CO2 25 22 - 32 mmol/L   Glucose, Bld 105 (H) 65 - 99 mg/dL   BUN 8 6 - 20 mg/dL   Creatinine, Ser 1.12 0.61 - 1.24 mg/dL   Calcium 8.3 (L) 8.9 - 10.3 mg/dL   GFR calc non Af Amer >60 >60 mL/min   GFR calc Af Amer >60 >60 mL/min    Comment: (NOTE) The eGFR has been calculated using the CKD EPI equation. This calculation has not been validated in all clinical situations. eGFR's persistently <60 mL/min signify possible Chronic Kidney Disease.    Anion gap 6 5 - 15  CBC     Status: Abnormal   Collection Time: 08/27/15  6:34 AM  Result Value Ref Range   WBC 5.4 4.0 - 10.5 K/uL   RBC 3.98 (L) 4.22 - 5.81 MIL/uL   Hemoglobin 12.3 (L) 13.0 - 17.0 g/dL   HCT 36.9 (L) 39.0 - 52.0 %   MCV 92.7 78.0 - 100.0 fL   MCH 30.9 26.0 -  34.0 pg   MCHC 33.3 30.0 - 36.0 g/dL   RDW 13.7 11.5 - 15.5 %   Platelets 234 150 - 400 K/uL  Urinalysis, Routine w reflex microscopic (not at [facility])     Status: None   Collection Time: 08/27/15  9:25 AM  Result Value Ref Range   Color, Urine YELLOW YELLOW   APPearance CLEAR CLEAR   Specific Gravity, Urine 1.011 1.005 - 1.030   pH 7.0 5.0 - 8.0   Glucose, UA NEGATIVE NEGATIVE mg/dL   Hgb urine dipstick NEGATIVE NEGATIVE   Bilirubin Urine NEGATIVE NEGATIVE   Ketones, ur NEGATIVE NEGATIVE mg/dL   Protein, ur NEGATIVE NEGATIVE mg/dL   Nitrite NEGATIVE NEGATIVE   Leukocytes, UA NEGATIVE NEGATIVE    Comment: MICROSCOPIC NOT DONE ON URINES WITH NEGATIVE PROTEIN, BLOOD, LEUKOCYTES, NITRITE, OR GLUCOSE <1000 mg/dL.  Culture, blood (routine x 2)     Status: None (Preliminary result)   Collection Time: 08/27/15  9:43 AM  Result Value Ref Range   Specimen Description BLOOD RIGHT WRIST    Special Requests BOTTLES DRAWN  AEROBIC AND ANAEROBIC 10CC    Culture  Setup Time      GRAM VARIABLE ROD AEROBIC BOTTLE ONLY CRITICAL RESULT CALLED TO, READ BACK BY AND VERIFIED WITH: J. ODDONO,RN AT 0623 ON [id number] BY S. YARBROUGH    Culture CULTURE REINCUBATED FOR BETTER GROWTH    Report Status PENDING   Troponin I (q 6hr x 3)     Status: None   Collection Time: 08/27/15 12:55 PM  Result Value Ref Range   Troponin I 0.03 <0.031 ng/mL    Comment:        NO INDICATION OF MYOCARDIAL INJURY.   CBC     Status: Abnormal   Collection Time: 08/28/15  8:16 AM  Result Value Ref Range   WBC 6.6 4.0 - 10.5 K/uL   RBC 3.88 (L) 4.22 - 5.81 MIL/uL   Hemoglobin 11.9 (L) 13.0 - 17.0 g/dL   HCT 35.5 (L) 39.0 - 52.0 %   MCV 91.5 78.0 - 100.0 fL   MCH 30.7 26.0 - 34.0 pg   MCHC 33.5 30.0 - 36.0 g/dL   RDW 13.8 11.5 - 15.5 %   Platelets 219 150 - 400 K/uL  Basic metabolic panel     Status: Abnormal   Collection Time: 08/28/15  8:16 AM  Result Value Ref Range   Sodium 130 (L) 135 - 145 mmol/L   Potassium 3.5 3.5 - 5.1 mmol/L   Chloride 101 101 - 111 mmol/L   CO2 22 22 - 32 mmol/L   Glucose, Bld 124 (H) 65 - 99 mg/dL   BUN 9 6 - 20 mg/dL   Creatinine, Ser 1.07 0.61 - 1.24 mg/dL   Calcium 8.1 (L) 8.9 - 10.3 mg/dL   GFR calc non Af Amer >60 >60 mL/min   GFR calc Af Amer >60 >60 mL/min    Comment: (NOTE) The eGFR has been calculated using the CKD EPI equation. This calculation has not been validated in all clinical situations. eGFR's persistently <60 mL/min signify possible Chronic Kidney Disease.    Anion gap 7 5 - 15      Component Value Date/Time   SDES BLOOD RIGHT WRIST 08/27/2015 0943   SPECREQUEST BOTTLES DRAWN AEROBIC AND ANAEROBIC 10CC 08/27/2015 0943   CULT CULTURE REINCUBATED FOR BETTER GROWTH 08/27/2015 0943   REPTSTATUS PENDING 08/27/2015 0943   Dg Chest Port 1 View  08/27/2015  CLINICAL DATA:  Fever  with back pain. History of ALS, CVA and rheumatoid arthritis. EXAM: PORTABLE CHEST 1 VIEW COMPARISON:  CT  and radiographs 08/23/2015. FINDINGS: 0831 hours. There are lower lung volumes. Patient has chronic fibrotic changes at both lung bases with probable mild superimposed atelectasis related to the lower lung volumes. There is no consolidation or significant pleural effusion. The heart size and mediastinal contours are stable. IMPRESSION: Stable fibrotic changes at both lung bases with mild superimposed atelectasis. No evidence of pneumonia. Electronically Signed   By: Richardean Sale M.D.   On: 08/27/2015 10:45   Recent Results (from the past 240 hour(s))  Blood culture (routine x 2)     Status: None   Collection Time: 08/19/15  9:45 PM  Result Value Ref Range Status   Specimen Description BLOOD RIGHT FOREARM  Final   Special Requests IN PEDIATRIC BOTTLE [facility]  Final   Culture NO GROWTH 5 DAYS  Final   Report Status 08/24/2015 FINAL  Final  Blood culture (routine x 2)     Status: None   Collection Time: 08/19/15  9:51 PM  Result Value Ref Range Status   Specimen Description BLOOD RIGHT HAND  Final   Special Requests BOTTLES DRAWN AEROBIC AND ANAEROBIC 5CC  Final   Culture NO GROWTH 5 DAYS  Final   Report Status 08/24/2015 FINAL  Final  Culture, blood (routine x 2)     Status: None (Preliminary result)   Collection Time: 08/27/15  9:43 AM  Result Value Ref Range Status   Specimen Description BLOOD RIGHT WRIST  Final   Special Requests BOTTLES DRAWN AEROBIC AND ANAEROBIC 10CC  Final   Culture  Setup Time   Final    GRAM VARIABLE ROD AEROBIC BOTTLE ONLY CRITICAL RESULT CALLED TO, READ BACK BY AND VERIFIED WITH: J. ODDONO,RN AT 3254 ON [id number] BY S. YARBROUGH    Culture CULTURE REINCUBATED FOR BETTER GROWTH  Final   Report Status PENDING  Incomplete      08/28/2015, 11:23 AM      Records and images were personally reviewed where available.

## 2015-08-28 NOTE — Progress Notes (Signed)
ANTIBIOTIC CONSULT NOTE - INITIAL  Pharmacy Consult for zosyn, vanc Indication: bacteremia  Allergies  Allergen Reactions  . Dopamine Anaphylaxis  . Morphine And Related Other (See Comments)    Hallucinations, very bad reactions  . Vicodin [Hydrocodone-Acetaminophen] Nausea And Vomiting    Patient Measurements: Height: 5\' 11"  (180.3 cm) Weight: 168 lb (76.204 kg) IBW/kg (Calculated) : 75.3  Vital Signs: Temp: 97.9 F (36.6 C) (12/04 1145) Temp Source: Oral (12/04 1145) BP: 120/76 mmHg (12/04 1145) Pulse Rate: 71 (12/04 1145) Intake/Output from previous day: 12/03 0701 - 12/04 0700 In: 2095.4 [P.O.:670; I.V.:1425.4] Out: 2525 [Urine:2525] Intake/Output from this shift: Total I/O In: 431.7 [P.O.:310; I.V.:121.7] Out: 1150 [Urine:1150]  Labs:  Recent Labs  08/26/15 1502 08/27/15 0634 08/28/15 0816  WBC 4.5 5.4 6.6  HGB 12.5* 12.3* 11.9*  PLT 256 234 219  CREATININE 1.17 1.12 1.07   Estimated Creatinine Clearance: 66.5 mL/min (by C-G formula based on Cr of 1.07). No results for input(s): VANCOTROUGH, VANCOPEAK, VANCORANDOM, GENTTROUGH, GENTPEAK, GENTRANDOM, TOBRATROUGH, TOBRAPEAK, TOBRARND, AMIKACINPEAK, AMIKACINTROU, AMIKACIN in the last 72 hours.   Assessment: 74 yo male admitted after falling and passing out  PMH: CVA 2006/2008/2014/11-16, HTN, RA, ALS  ID: abx for bacteremia. WBC 6.6, Tmax 100.4  Vanc 12/4>> Zosyn 12/4>>  12/3 Blood x 2 >> 1/2 G. Variable Rod (contaminant?) 12/3 Urine >> Neg 12/4 Blood x 2  Renal: Scr 1.07  Goal of Therapy:  Vancomycin trough level 15-20 mcg/ml  Plan:  Zosyn 3.375 gm IV q8h Vancomycin 1750 mg x 1 then 1g q12h F/U repeat cultures, ID recs, TEE, VT prn  Levester Fresh, PharmD, BCPS, [facility] Clinical Pharmacist Pager [phone] 08/28/2015 1:38 PM

## 2015-08-28 NOTE — NC FL2 (Signed)
[place] LEVEL OF CARE SCREENING TOOL     IDENTIFICATION  Patient Name: Brett Castaneda Birthdate: [birth year] Sex: male Admission Date (Current Location): 08/26/2015  [facility] and [place] Number: Herbalist and Address:  [facility], [place] [address], [place], [place] [address]      Provider Number: [id number]  Attending Physician Name and Address:  Jonetta Osgood, MD  Relative Name and Phone Number:       Current Level of Care: Hospital Recommended Level of Care: Nursing Facility Prior Approval Number:    Date Approved/Denied:   PASRR Number: [id number]  Discharge Plan: SNF    Current Diagnoses: Patient Active Problem List   Diagnosis Date Noted  . Fall 08/26/2015  . Scalp laceration 08/26/2015  . Weakness 08/26/2015  . Dizziness   . Syncope 08/24/2015  . ARF (acute renal failure) ([place]) 08/24/2015  . Pain in the chest   . Faintness   . Cerebral infarction due to unspecified mechanism   . CVA (cerebral infarction) 08/20/2015  . Hyponatremia 08/20/2015  . Anemia due to other cause 08/20/2015  . Thrombocytopenia ([place]) 08/20/2015  . Acute ischemic stroke ([place]) 08/20/2015  . Pyrexia   . 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 ALS (amyotrophic lateral sclerosis) ([place]) 10/08/2013  . Pain in limb-Left leg 07/30/2013  . Weakness of distal arms and legs 07/30/2013  . Aftercare following surgery of the circulatory system, [place] 07/30/2013  . Sensorimotor neuropathy ([place]) 07/07/2013  . AAA (abdominal aortic aneurysm) without rupture ([place]) 04/16/2013  . CAP (community acquired pneumonia) 04/09/2013  . Chest pain 04/09/2013  . 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 Malignant neoplasm of skin of parts of face 03/31/2009  . Essential hypertension 12/14/2008  . CONSTIPATION 06/22/2008  . Dyslipidemia 08/27/2007  . Other and unspecified hyperlipidemia 08/27/2007  . GERD 03/24/2007  .  DIVERTICULOSIS, COLON 03/24/2007  . Rheumatoid arthritis ([place]) 03/24/2007  . CEREBROVASCULAR ACCIDENT, HX OF 03/24/2007    Orientation ACTIVITIES/SOCIAL BLADDER RESPIRATION    Self, Time, Situation, Place    Continent Normal  BEHAVIORAL SYMPTOMS/MOOD NEUROLOGICAL BOWEL NUTRITION STATUS      Continent Diet (Heart Healthy)  PHYSICIAN VISITS COMMUNICATION OF NEEDS Height & Weight Skin  30 days Verbally 5\' 11"  (180.3 cm) 168 lbs. Normal          AMBULATORY STATUS RESPIRATION    Assist independent Normal      Personal Care Assistance Level of Assistance  Total care       Total Care Assistance: Limited assistance    Functional Limitations Info                SPECIAL CARE FACTORS FREQUENCY  PT (By licensed PT)     PT Frequency: 5x/ wek             Additional Factors Info  Code Status Code Status Info: DNR             Current Medications (08/28/2015):  This is the current hospital active medication list Current Facility-Administered Medications  Medication Dose Route Frequency Provider Last Rate Last Dose  . 0.9 %  sodium chloride infusion   Intravenous Continuous Jonetta Osgood, MD 10 mL/hr at 08/28/15 1005    . acetaminophen (TYLENOL) tablet 650 mg  650 mg Oral Q6H PRN Theressa Millard, MD   650 mg at 08/28/15 0735   Or  . acetaminophen (TYLENOL) suppository 650 mg  650 mg Rectal Q6H PRN Theressa Millard, MD      . alum & mag hydroxide-simeth (MAALOX/MYLANTA) 200-200-20 MG/5ML suspension 30 mL  30 mL Oral Q6H PRN Theressa Millard, MD      . amLODipine (NORVASC) tablet 5 mg  5 mg Oral Daily Theressa Millard, MD   5 mg at 08/28/15 0735  . aspirin EC tablet 325 mg  325 mg Oral Daily Theressa Millard, MD   325 mg at 08/28/15 0735  . enoxaparin (LOVENOX) injection 40 mg  40 mg Subcutaneous Q24H Kimberly B Hammons, RPH   40 mg at 08/28/15 1201  . folic acid-pyridoxine-cyancobalamin (FOLTX) 2.5-25-2 MG per tablet 1 tablet  1 tablet Oral Q Sat Theressa Millard, MD   1 tablet at 08/27/15 1033  . hydrALAZINE (APRESOLINE) injection 10 mg  10 mg Intravenous Q6H PRN Theressa Millard, MD      . HYDROmorphone (DILAUDID) injection 0.5 mg  0.5 mg Intravenous Q4H PRN Jonetta Osgood, MD      . ibuprofen (ADVIL,MOTRIN) tablet 600 mg  600 mg Oral Q6H PRN Jonetta Osgood, MD   600 mg at 08/28/15 1517  . latanoprost (XALATAN) 0.005 % ophthalmic solution 1 drop  1 drop Left Eye QHS Theressa Millard, MD   1 drop at 08/27/15 2141  . meclizine (ANTIVERT) tablet 25 mg  25 mg Oral TID PRN Jonetta Osgood, MD      . multivitamin-lutein (OCUVITE-LUTEIN) capsule 1 capsule  1 capsule Oral Daily Theressa Millard, MD   1 capsule at 08/28/15 1201  . ondansetron (ZOFRAN) tablet 4 mg  4 mg Oral Q6H PRN Theressa Millard, MD       Or  . ondansetron (ZOFRAN) injection 4 mg  4 mg Intravenous Q6H PRN Theressa Millard, MD      . oxyCODONE (Oxy IR/ROXICODONE) immediate release tablet 5 mg  5 mg Oral Q4H PRN Theressa Millard, MD   5 mg at 08/28/15 1550  . pantoprazole (PROTONIX) EC tablet 40 mg  40 mg Oral Q1200 Jonetta Osgood, MD   40 mg at 08/28/15 1201  . piperacillin-tazobactam (ZOSYN) IVPB 3.375 g  3.375 g Intravenous Q8H Wynell Balloon, RPH   3.375 g at 08/28/15 1633  . polyethylene glycol (MIRALAX / GLYCOLAX) packet 17 g  17 g Oral Daily PRN Theressa Millard, MD   17 g at 08/28/15 0735  . simvastatin (ZOCOR) tablet 40 mg  40 mg Oral q1800 Theressa Millard, MD   40 mg at 08/28/15 1719  . sodium chloride 0.9 % injection 3 mL  3 mL Intravenous Q12H Theressa Millard, MD   3 mL at 08/28/15 1000  . timolol (TIMOPTIC) 0.5 % ophthalmic solution 1 drop  1 drop Left Eye QPM Theressa Millard, MD   1 drop at 08/28/15 1719  . [START ON 08/29/2015] vancomycin (VANCOCIN) IVPB 1000 mg/200 mL premix  1,000 mg Intravenous Q12H Wynell Balloon, [facility]         Discharge Medications: Please see discharge summary for a list of discharge medications.  Relevant  Imaging Results:  Relevant Lab Results:  Recent Labs    Additional Information    Devynn Hessler, Daneil Dolin, LCSW

## 2015-08-28 NOTE — Clinical Social Work Placement (Signed)
   CLINICAL SOCIAL WORK PLACEMENT  NOTE  Date:  08/28/2015  Patient Details  Name: Brett Castaneda MRN: [id number] Date of Birth: October 25, 1940  Clinical Social Work is seeking post-discharge placement for this patient at the [employer] level of care (*CSW will initial, date and re-position this form in  chart as items are completed):  Yes   Patient/family provided with [place] Work Department's list of facilities offering this level of care within the geographic area requested by the patient (or if unable, by the patient's family).  Yes   Patient/family informed of their freedom to choose among providers that offer the needed level of care, that participate in Medicare, Medicaid or managed care program needed by the patient, have an available bed and are willing to accept the patient.  Yes   Patient/family informed of [facility]'s ownership interest in [facility] and [facility], as well as of the fact that they are under no obligation to receive care at these facilities.  PASRR submitted to EDS on 08/28/15     PASRR number received on 08/28/15     Existing PASRR number confirmed on       FL2 transmitted to all facilities in geographic area requested by pt/family on 08/28/15     FL2 transmitted to all facilities within larger geographic area on       Patient informed that his/her managed care company has contracts with or will negotiate with certain facilities, including the following:            Patient/family informed of bed offers received.  Patient chooses bed at       Physician recommends and patient chooses bed at      Patient to be transferred to   on  .  Patient to be transferred to facility by       Patient family notified on   of transfer.  Name of family member notified:        PHYSICIAN Please sign FL2     Additional Comment:    _______________________________________________ Ross Ludwig, LCSWA 08/28/2015,  6:14 PM

## 2015-08-29 ENCOUNTER — Encounter: Payer: Self-pay | Admitting: Internal Medicine

## 2015-08-29 DIAGNOSIS — M069 Rheumatoid arthritis, unspecified: Secondary | ICD-10-CM

## 2015-08-29 LAB — CBC
HCT: 35.6 % — ABNORMAL LOW (ref 39.0–52.0)
Hemoglobin: 12.3 g/dL — ABNORMAL LOW (ref 13.0–17.0)
MCH: 31.5 pg (ref 26.0–34.0)
MCHC: 34.6 g/dL (ref 30.0–36.0)
MCV: 91.3 fL (ref 78.0–100.0)
PLATELETS: 237 10*3/uL (ref 150–400)
RBC: 3.9 MIL/uL — ABNORMAL LOW (ref 4.22–5.81)
RDW: 14.1 % (ref 11.5–15.5)
WBC: 6.8 10*3/uL (ref 4.0–10.5)

## 2015-08-29 LAB — BASIC METABOLIC PANEL
Anion gap: 7 (ref 5–15)
BUN: 9 mg/dL (ref 6–20)
CO2: 24 mmol/L (ref 22–32)
CREATININE: 1.24 mg/dL (ref 0.61–1.24)
Calcium: 8 mg/dL — ABNORMAL LOW (ref 8.9–10.3)
Chloride: 98 mmol/L — ABNORMAL LOW (ref 101–111)
GFR calc Af Amer: >60 mL/min (ref >60)
GFR, EST NON AFRICAN AMERICAN: 56 mL/min — AB (ref >60)
Glucose, Bld: 135 mg/dL — ABNORMAL HIGH (ref 65–99)
Potassium: 3.8 mmol/L (ref 3.5–5.1)
SODIUM: 129 mmol/L — AB (ref 135–145)

## 2015-08-29 LAB — OSMOLALITY: Osmolality: 264 mOsm/kg — ABNORMAL LOW (ref 275–295)

## 2015-08-29 LAB — OSMOLALITY, URINE: Osmolality, Ur: 427 mOsm/kg (ref 300–900)

## 2015-08-29 LAB — SODIUM, URINE, RANDOM: Sodium, Ur: 86 mmol/L

## 2015-08-29 NOTE — Progress Notes (Signed)
INFECTIOUS DISEASE PROGRESS NOTE  ID: Brett Castaneda is a 74 y.o. male with  Principal Problem:   Syncope Active Problems:   Dyslipidemia   Essential hypertension   Rheumatoid arthritis ([place])   ALS (amyotrophic lateral sclerosis) (H[facility])   Cerebral infarction due to unspecified mechanism   Fall   Scalp laceration   Weakness   Bacteremia  Subjective: C/o being disappointed with himself.   Abtx:  Anti-infectives    Start     Dose/Rate Route Frequency Ordered Stop   08/29/15 0200  vancomycin (VANCOCIN) IVPB 1000 mg/200 mL premix     1,000 mg 200 mL/hr over 60 Minutes Intravenous Every 12 hours 08/28/15 1337     08/28/15 1400  piperacillin-tazobactam (ZOSYN) IVPB 3.375 g     3.375 g 12.5 mL/hr over 240 Minutes Intravenous Every 8 hours 08/28/15 1337     08/28/15 1400  vancomycin (VANCOCIN) 1,750 mg in sodium chloride 0.9 % 500 mL IVPB     1,750 mg 250 mL/hr over 120 Minutes Intravenous  Once 08/28/15 1337 08/28/15 1558      Medications:  Scheduled: . amLODipine  5 mg Oral Daily  . aspirin EC  325 mg Oral Daily  . enoxaparin (LOVENOX) injection  40 mg Subcutaneous Q24H  . folic acid-pyridoxine-cyancobalamin  1 tablet Oral Q Sat  . latanoprost  1 drop Left Eye QHS  . multivitamin-lutein  1 capsule Oral Daily  . pantoprazole  40 mg Oral Q1200  . piperacillin-tazobactam (ZOSYN)  IV  3.375 g Intravenous Q8H  . simvastatin  40 mg Oral q1800  . sodium chloride  3 mL Intravenous Q12H  . timolol  1 drop Left Eye QPM  . vancomycin  1,000 mg Intravenous Q12H    Objective: Vital signs in last 24 hours: Temp:  [97.7 F (36.5 C)-99.7 F (37.6 C)] 99.7 F (37.6 C) (12/05 0500) Pulse Rate:  [71-99] 81 (12/05 0500) Resp:  [14-20] 20 (12/05 0248) BP: (120-169)/(72-95) 163/93 mmHg (12/05 0500) SpO2:  [94 %-100 %] 98 % (12/05 0500) Weight:  [73.755 kg (162 lb 9.6 oz)] 73.755 kg (162 lb 9.6 oz) (12/05 0500)   General appearance: alert, fatigued and mild distress Resp: clear to  auscultation bilaterally Cardio: regular rate and rhythm GI: normal findings: bowel sounds normal and soft, non-tender  Lab Results  Recent Labs  08/28/15 0816 08/29/15 0447  WBC 6.6 6.8  HGB 11.9* 12.3*  HCT 35.5* 35.6*  NA 130* 129*  K 3.5 3.8  CL 101 98*  CO2 22 24  BUN 9 9  CREATININE 1.07 1.24   Liver Panel No results for input(s): PROT, ALBUMIN, AST, ALT, ALKPHOS, BILITOT, BILIDIR, IBILI in the last 72 hours. Sedimentation Rate No results for input(s): ESRSEDRATE in the last 72 hours. C-Reactive Protein No results for input(s): CRP in the last 72 hours.  Microbiology: Recent Results (from the past 240 hour(s))  Blood culture (routine x 2)     Status: None   Collection Time: 08/19/15  9:45 PM  Result Value Ref Range Status   Specimen Description BLOOD RIGHT FOREARM  Final   Special Requests IN PEDIATRIC BOTTLE [place]  Final   Culture NO GROWTH 5 DAYS  Final   Report Status 08/24/2015 FINAL  Final  Blood culture (routine x 2)     Status: None   Collection Time: 08/19/15  9:51 PM  Result Value Ref Range Status   Specimen Description BLOOD RIGHT HAND  Final   Special Requests BOTTLES DRAWN  AEROBIC AND ANAEROBIC 5CC  Final   Culture NO GROWTH 5 DAYS  Final   Report Status 08/24/2015 FINAL  Final  Urine culture     Status: None   Collection Time: 08/27/15  9:25 AM  Result Value Ref Range Status   Specimen Description URINE, RANDOM  Final   Special Requests UR BAG PED  Final   Culture MULTIPLE SPECIES PRESENT, SUGGEST RECOLLECTION  Final   Report Status 08/28/2015 FINAL  Final  Culture, blood (routine x 2)     Status: None (Preliminary result)   Collection Time: 08/27/15  9:40 AM  Result Value Ref Range Status   Specimen Description BLOOD LEFT ANTECUBITAL  Final   Special Requests BOTTLES DRAWN AEROBIC AND ANAEROBIC 10CC  Final   Culture NO GROWTH 1 DAY  Final   Report Status PENDING  Incomplete  Culture, blood (routine x 2)     Status: None (Preliminary result)     Collection Time: 08/27/15  9:43 AM  Result Value Ref Range Status   Specimen Description BLOOD RIGHT WRIST  Final   Special Requests BOTTLES DRAWN AEROBIC AND ANAEROBIC 10CC  Final   Culture  Setup Time   Final    GRAM VARIABLE ROD AEROBIC BOTTLE ONLY CRITICAL RESULT CALLED TO, READ BACK BY AND VERIFIED WITH: J. ODDONO,RN AT [id number] ON [id number] BY S. YARBROUGH    Culture CULTURE REINCUBATED FOR BETTER GROWTH  Final   Report Status PENDING  Incomplete    Studies/Results: No results found.   Assessment/Plan: Fever Bacteremia CVA  Await further ID of organism.  repeat BCx pending TEE in AM Certainly possible this is a contaminant Total days of antibiotics: 1 vanco/zosyn         [name] Infectious Diseases (pager) [phone] [email or web address] 08/29/2015, 9:32 AM  LOS: 1 day

## 2015-08-29 NOTE — Clinical Social Work Note (Signed)
Bed offers given to patient and wife. They will discuss options with family and determine their top three SNF choices.   Liz Beach MSW, [place], [place], [id number]

## 2015-08-29 NOTE — Progress Notes (Signed)
PATIENT DETAILS Name: Brett Castaneda Age: 74 y.o. Sex: male Date of Birth: 1941/03/20 Admit Date: 08/26/2015 Admitting Physician Brett Millard, MD [id number] Brett Plate, MD  Subjective: No dizziness noted on sitting up, back pain much better.   Assessment/Plan: Principal Problem: Recurrent Syncope: EKG/telemetry negative. Orthostatics on admission negative.Loop recorder interrogation negative for arrhythmias.Recent transthoracic echocardiogram on 11/26 showed preserved ejection fraction.I suspect that this is most likely related to fever/bacteremia/neuropathy issues at this time. Continue telemetry monitoring. Follow  Active Problems: Bacteremia: Febrile this admission-One set of blood culture positive for gram variable rod. UA negative for UTI, chest x-ray negative for pneumonia.Given recent CVA-that was thought to be embolic-is this septic emboli. Repeat cultures 12/4 pending-ID consulted-on empiric Vanco/Zosyn-follow. TEE tomorrow am.   Hyponatremia:euvolemic on exam-stop IVF, fluid restrict and follow. Check UNa, Urine/Serum Osm.  Recent CVA: Nonfocal exam. Continue aspirin, statin. 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 TEE tomorrow am.   Back Pain: much better today-suspect this was secondary to fall/syncope-(hit his back) that occurred on admission. Much better with supportive measures-since improving-suspect we can hold of scanning spine.   Scalp laceration: Secondary to fall-remove sutures in 1 week  Hypertension: Controlled-continue amlodipine  Dyslipidemia: Continue statin  History of unspecified peripheral neuropathy:? Related to methotrexate  ? ALS: Reviewed prior note from Brett Castaneda on 11/29-he doubts ALS.  History of rheumatoid arthritis: Hold methotrexate-given fever  Deconditioning/frequent falls: PT evaluation- SNF placement.Consult social  workl  Disposition: Remain inpatient-await further work up-SNF on discharge  Antimicrobial agents  See below  Anti-infectives    Start     Dose/Rate Route Frequency Ordered Stop   08/29/15 0200  vancomycin (VANCOCIN) IVPB 1000 mg/200 mL premix     1,000 mg 200 mL/hr over 60 Minutes Intravenous Every 12 hours 08/28/15 1337     08/28/15 1400  piperacillin-tazobactam (ZOSYN) IVPB 3.375 g     3.375 g 12.5 mL/hr over 240 Minutes Intravenous Every 8 hours 08/28/15 1337     08/28/15 1400  vancomycin (VANCOCIN) 1,750 mg in sodium chloride 0.9 % 500 mL IVPB     1,750 mg 250 mL/hr over 120 Minutes Intravenous  Once 08/28/15 1337 08/28/15 1558      DVT Prophylaxis: Prophylactic Lovenox  Code Status:  DNR  Family Communication None  at bedside  Procedures: None  CONSULTS:  None  Time spent 30 minutes-Greater than 50% of this time was spent in counseling, explanation of diagnosis, planning of further management, and coordination of care.  MEDICATIONS: Scheduled Meds: . amLODipine  5 mg Oral Daily  . aspirin EC  325 mg Oral Daily  . enoxaparin (LOVENOX) injection  40 mg Subcutaneous Q24H  . folic acid-pyridoxine-cyancobalamin  1 tablet Oral Q Sat  . latanoprost  1 drop Left Eye QHS  . multivitamin-lutein  1 capsule Oral Daily  . pantoprazole  40 mg Oral Q1200  . piperacillin-tazobactam (ZOSYN)  IV  3.375 g Intravenous Q8H  . simvastatin  40 mg Oral q1800  . sodium chloride  3 mL Intravenous Q12H  . timolol  1 drop Left Eye QPM  . vancomycin  1,000 mg Intravenous Q12H   Continuous Infusions: . sodium chloride 10 mL/hr at 08/28/15 1005   PRN Meds:.acetaminophen **OR** acetaminophen, alum & mag hydroxide-simeth, hydrALAZINE, HYDROmorphone (DILAUDID) injection, ibuprofen, meclizine, ondansetron **OR** ondansetron (ZOFRAN) IV, oxyCODONE, polyethylene glycol    PHYSICAL EXAM: Vital signs in  last 24 hours: Filed Vitals:   08/29/15 0248 08/29/15 0500 08/29/15 0939 08/29/15  1227  BP: 146/88 163/93 172/96   Pulse: 97 81    Temp:  99.7 F (37.6 C)  101 F (38.3 C)  TempSrc:    Oral  Resp: 20     Height:      Weight:  73.755 kg (162 lb 9.6 oz)    SpO2: 94% 98%      Weight change: -2.449 kg (-5 lb 6.4 oz) Filed Weights   08/27/15 0046 08/28/15 0505 08/29/15 0500  Weight: 76.749 kg (169 lb 3.2 oz) 76.204 kg (168 lb) 73.755 kg (162 lb 9.6 oz)   Body mass index is 22.69 kg/(m^2).   Gen Exam: Awake and alert with clear speech.  Neck: Supple, No JVD.   Chest: B/L Clear.   CVS: S1 S2 Regular, no murmurs.  Abdomen: soft, BS +, non tender, non distended.  Extremities: no edema, lower extremities warm to touch. Neurologic: Non Focal-5/5 in all 4 extremities  Skin: No Rash.   Wounds: N/A.   Intake/Output from previous day:  Intake/Output Summary (Last 24 hours) at 08/29/15 1258 Last data filed at 08/29/15 1227  Gross per 24 hour  Intake 1581.01 ml  Output   3200 ml  Net -1618.99 ml     LAB RESULTS: CBC  Recent Labs Lab 08/24/15 0427 08/26/15 1502 08/27/15 0634 08/28/15 0816 08/29/15 0447  WBC 6.7 4.5 5.4 6.6 6.8  HGB 11.0* 12.5* 12.3* 11.9* 12.3*  HCT 32.8* 36.5* 36.9* 35.5* 35.6*  PLT 212 256 234 219 237  MCV 92.4 92.4 92.7 91.5 91.3  MCH 31.0 31.6 30.9 30.7 31.5  MCHC 33.5 34.2 33.3 33.5 34.6  RDW 13.6 13.8 13.7 13.8 14.1  LYMPHSABS 1.4 1.1  --   --   --   MONOABS 0.3 0.3  --   --   --   EOSABS 0.1 0.1  --   --   --   BASOSABS 0.0 0.0  --   --   --     Chemistries   Recent Labs Lab 08/24/15 0427 08/24/15 1017 08/26/15 1502 08/27/15 0634 08/28/15 0816 08/29/15 0447  NA 135  --  132* 134* 130* 129*  K 4.2  --  3.6 3.9 3.5 3.8  CL 107  --  100* 103 101 98*  CO2 22  --  24 25 22 24   GLUCOSE 109*  --  124* 105* 124* 135*  BUN 12  --  11 8 9 9   CREATININE 1.13  --  1.17 1.12 1.07 1.24  CALCIUM 7.9*  --  8.4* 8.3* 8.1* 8.0*  MG  --  1.8  --   --   --   --     CBG: No results for input(s): GLUCAP in the last 168  hours.  GFR Estimated Creatinine Clearance: 56.2 mL/min (by C-G formula based on Cr of 1.24).  Coagulation profile  Recent Labs Lab 08/23/15 2202  INR 1.11    Cardiac Enzymes  Recent Labs Lab 08/27/15 0135 08/27/15 0634 08/27/15 1255  TROPONINI 0.03 0.03 0.03    Invalid input(s): POCBNP No results for input(s): DDIMER in the last 72 hours. No results for input(s): HGBA1C in the last 72 hours. No results for input(s): CHOL, HDL, LDLCALC, TRIG, CHOLHDL, LDLDIRECT in the last 72 hours. No results for input(s): TSH, T4TOTAL, T3FREE, THYROIDAB in the last 72 hours.  Invalid input(s): FREET3 No results for input(s): VITAMINB12, FOLATE, FERRITIN, TIBC, IRON,  RETICCTPCT in the last 72 hours. No results for input(s): LIPASE, AMYLASE in the last 72 hours.  Urine Studies No results for input(s): UHGB, CRYS in the last 72 hours.  Invalid input(s): UACOL, UAPR, USPG, UPH, UTP, UGL, UKET, UBIL, UNIT, UROB, ULEU, UEPI, UWBC, URBC, UBAC, CAST, UCOM, BILUA  MICROBIOLOGY: Recent Results (from the past 240 hour(s))  Blood culture (routine x 2)     Status: None   Collection Time: 08/19/15  9:45 PM  Result Value Ref Range Status   Specimen Description BLOOD RIGHT FOREARM  Final   Special Requests IN PEDIATRIC BOTTLE [place]  Final   Culture NO GROWTH 5 DAYS  Final   Report Status 08/24/2015 FINAL  Final  Blood culture (routine x 2)     Status: None   Collection Time: 08/19/15  9:51 PM  Result Value Ref Range Status   Specimen Description BLOOD RIGHT HAND  Final   Special Requests BOTTLES DRAWN AEROBIC AND ANAEROBIC 5CC  Final   Culture NO GROWTH 5 DAYS  Final   Report Status 08/24/2015 FINAL  Final  Urine culture     Status: None   Collection Time: 08/27/15  9:25 AM  Result Value Ref Range Status   Specimen Description URINE, RANDOM  Final   Special Requests UR BAG PED  Final   Culture MULTIPLE SPECIES PRESENT, SUGGEST RECOLLECTION  Final   Report Status 08/28/2015 FINAL  Final   Culture, blood (routine x 2)     Status: None (Preliminary result)   Collection Time: 08/27/15  9:40 AM  Result Value Ref Range Status   Specimen Description BLOOD LEFT ANTECUBITAL  Final   Special Requests BOTTLES DRAWN AEROBIC AND ANAEROBIC 10CC  Final   Culture NO GROWTH 1 DAY  Final   Report Status PENDING  Incomplete  Culture, blood (routine x 2)     Status: None (Preliminary result)   Collection Time: 08/27/15  9:43 AM  Result Value Ref Range Status   Specimen Description BLOOD RIGHT WRIST  Final   Special Requests BOTTLES DRAWN AEROBIC AND ANAEROBIC 10CC  Final   Culture  Setup Time   Final    GRAM VARIABLE ROD AEROBIC BOTTLE ONLY CRITICAL RESULT CALLED TO, READ BACK BY AND VERIFIED WITH: J. ODDONO,RN AT [id number] ON [id number] BY Rhea Bleacher    Culture CULTURE REINCUBATED FOR BETTER GROWTH  Final   Report Status PENDING  Incomplete    RADIOLOGY STUDIES/RESULTS: Dg Chest 2 View  08/19/2015  CLINICAL DATA:  Stroke symptoms tonight. Fever. Hypertension. Ex-smoker. EXAM: CHEST  2 VIEW COMPARISON:  10/28/2013 FINDINGS: Mild cardiac enlargement without vascular congestion. Interstitial changes in the lung bases similar to previous study, likely fibrosis. No focal airspace disease. No blunting of costophrenic angles. No pneumothorax. Calcified and tortuous aorta. Surgical clips in the right upper quadrant. 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 Mild cardiac enlargement. No evidence of active pulmonary disease. Electronically Signed   By: Lucienne Capers M.D.   On: 08/19/2015 23:02   Ct Head Wo Contrast  08/24/2015  CLINICAL DATA:  74 year old male with syncope. EXAM: CT HEAD WITHOUT CONTRAST TECHNIQUE: Contiguous axial images were obtained from the base of the skull through the vertex without intravenous contrast. COMPARISON:  Brain CT and MRI dated 08/19/2015 FINDINGS: The ventricles are dilated and the sulci are prominent compatible with age-related atrophy. Periventricular and deep white  matter hypodensities represent chronic microvascular ischemic changes. Stable right frontal lobe old infarct and encephalomalacia. There is no intracranial hemorrhage.  No mass effect or midline shift identified. The visualized paranasal sinuses and mastoid air cells are well aerated. The calvarium is intact. There is a stable calcific changes of the right globe. IMPRESSION: No acute intracranial pathology. Age-related atrophy and chronic microvascular ischemic disease. Stable right frontal lobe old infarct and encephalomalacia. If symptoms persist and there are no contraindications, MRI may provide better evaluation if clinically indicated. Electronically Signed   By: Anner Crete M.D.   On: 08/24/2015 00:29   Ct Head Wo Contrast  08/19/2015  CLINICAL DATA:  Altered mental status.  No weakness.  Code stroke. EXAM: CT HEAD WITHOUT CONTRAST TECHNIQUE: Contiguous axial images were obtained from the base of the skull through the vertex without intravenous contrast. COMPARISON:  07/16/2013 FINDINGS: Diffuse cerebral atrophy. No ventricular dilatation. Low-attenuation changes in the deep white matter consistent with small vessel ischemia. Focal encephalomalacia in the right frontal lobe consistent with old infarct. No mass effect or midline shift. No abnormal extra-axial fluid collections. Gray-white matter junctions are distinct. Basal cisterns are not effaced. No evidence of acute intracranial hemorrhage. No depressed skull fractures. Mild mucosal thickening in the paranasal sinuses. Mastoid air cells are not opacified. Vascular calcifications. Calcification and deformity of the right globe consistent with old insult. Focal increased density in the left globe likely postoperative. IMPRESSION: No acute intracranial abnormalities. Chronic atrophy and small vessel ischemic changes. Old right frontal infarct. These results were called by telephone at the time of interpretation on 08/19/2015 at 8:38 pm to Dr.  Leonel Ramsay, who verbally acknowledged these results. Electronically Signed   By: Lucienne Capers M.D.   On: 08/19/2015 20:41   [name]  08/19/2015  CLINICAL DATA:  Initial valuation for acute altered mental status. EXAM: MRI HEAD WITHOUT CONTRAST TECHNIQUE: Multiplanar, multiecho pulse sequences of the brain and surrounding structures were obtained without intravenous contrast. COMPARISON:  Prior head CT from earlier the same day P FINDINGS: Diffusion-weighted sequences demonstrate a possible tiny 3 mm cortical infarct within the left temporal lobe (series 3, image 20). No associated edema or hemorrhage. No other infarct. Largely absent flow void within the diminutive right vertebral artery, stable. Major intracranial vascular flow voids are otherwise maintained. Diffuse prominence of the CSF containing spaces is compatible with generalized age-related cerebral atrophy. Mild chronic small vessel ischemic disease in within the periventricular white matter. Encephalomalacia within the high right frontal lobe consistent with remote ischemic infarct. There is associated gliosis within this region. Multiple additional remote lacunar infarcts within the right basal ganglia involving both the caudate and lentiform nuclei. No mass lesion, midline shift, or mass effect. No hydrocephalus. No extra-axial fluid collection. Pituitary gland normal. No acute abnormality about the globes. 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 Mild mucosal thickening within the ethmoidal air cells and maxillary sinuses. No mastoid effusion. Inner ear structures normal. Bone marrow signal intensity within normal limits. No scalp soft tissue abnormality. IMPRESSION: 1. 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 2. No other acute intracranial process identified. 3. Moderate to large remote infarct within the posterior right frontal  lobe with extension into the operculum. Additional remote lacunar infarcts involving the right basal ganglia. 4. Nonvisualization of the right vertebral artery, which may be occluded. This is stable relative to previous MRI from 2014. 5. Atrophy with mild chronic small vessel ischemic disease. Electronically Signed   By: Jeannine Boga M.D.   On: 08/19/2015 23:14  Dg Chest Port 1 View  08/27/2015  CLINICAL DATA:  Fever with back pain. History of ALS, CVA and rheumatoid arthritis. EXAM: PORTABLE CHEST 1 VIEW COMPARISON:  CT and radiographs 08/23/2015. FINDINGS: 0831 hours. There are lower lung volumes. Patient has chronic fibrotic changes at both lung bases with probable mild superimposed atelectasis related to the lower lung volumes. There is no consolidation or significant pleural effusion. The heart size and mediastinal contours are stable. IMPRESSION: Stable fibrotic changes at both lung bases with mild superimposed atelectasis. No evidence of pneumonia. Electronically Signed   By: Richardean Sale M.D.   On: 08/27/2015 10:45   Dg Chest Portable 1 View  08/23/2015  CLINICAL DATA:  Initial evaluation for 2 episodes of syncope today, nausea, weakness. EXAM: PORTABLE CHEST 1 VIEW COMPARISON:  Prior radiograph from 08/19/2015. FINDINGS: Cardiomegaly is stable from prior. Mediastinal silhouette within normal limits. Tortuosity intrathoracic aorta with atheromatous plaque within the aortic arch noted, stable. Lungs are hypoinflated with bibasilar fibrotic changes, left greater than right Ing, stable. No focal infiltrates. No pulmonary edema or pleural effusion. No pneumothorax. No acute osseus abnormality. IMPRESSION: Stable appearance of the chest with left greater than right bibasilar fibrosis and mild cardiomegaly. No active cardiopulmonary disease. Electronically Signed   By: Jeannine Boga M.D.   On: 08/23/2015 22:39   Ct Angio Chest Aorta W/cm &/or Wo/cm  08/24/2015  CLINICAL DATA:  [place]  today after becoming lightheaded. Was discharged from the hospital earlier today. EXAM: CT ANGIOGRAPHY CHEST, ABDOMEN AND PELVIS TECHNIQUE: Multidetector CT imaging through the chest, abdomen and pelvis was performed using the standard protocol during bolus administration of intravenous contrast. Multiplanar reconstructed images and MIPs were obtained and reviewed to evaluate the vascular anatomy. CONTRAST:  162mL OMNIPAQUE IOHEXOL 350 MG/ML SOLN COMPARISON:  07/23/2013 FINDINGS: CTA CHEST FINDINGS The thoracic aorta is normal in caliber. There is extensive atherosclerotic plaque. There is no dissection. Proximal great vessels are patent. There is good opacification of the pulmonary arteries with no evidence of pulmonary embolism. Review of the MIP images confirms the above findings. Nonvascular chest findings include multiple small thyroid nodules, multiple nonspecific nodes in the mediastinum and both hila, and fibrotic honeycombing in the basilar lung periphery. Central airways are patent. No pleural effusions or pericardial effusions. CTA ABDOMEN AND PELVIS FINDINGS The upper abdominal aorta is normal in caliber with extensive atherosclerotic plaque. There is mild fusiform dilatation of the infrarenal abdominal aorta to a maximum AP diameter 3.4 cm. There is mild stenosis of the celiac axis 1 cm from its origin. Superior mesenteric artery and inferior mesenteric artery are widely patent. Each kidney is served by a widely patent single main renal artery. The common iliac and external iliac arteries are patent bilaterally. No dissection of the abdominal aorta. Review of the MIP images confirms the above findings. Nonvascular findings include extensive colonic diverticulosis without evidence of diverticulitis or other acute inflammatory process. There are unremarkable arterial phase appearances of the liver, spleen, pancreas, adrenals and kidneys. There is prior cholecystectomy. Bile ducts are unremarkable.  Moderate prostatic enlargement. No significant skeletal lesions. IMPRESSION: 1. Extensive atherosclerotic plaque throughout the thoracic and abdominal aorta. There is no dissection. There is a 3.4 cm infrarenal abdominal aortic aneurysm. 2. Negative for acute pulmonary embolism. 3. Multiple tiny thyroid nodules. 4. Diverticulosis. Electronically Signed   By: Andreas Newport M.D.   On: 08/24/2015 00:34   Ct Angio Abd/pel W/ And/or W/o  08/24/2015  CLINICAL DATA:  [place] today after becoming lightheaded.  Was discharged from the hospital earlier today. EXAM: CT ANGIOGRAPHY CHEST, ABDOMEN AND PELVIS TECHNIQUE: Multidetector CT imaging through the chest, abdomen and pelvis was performed using the standard protocol during bolus administration of intravenous contrast. Multiplanar reconstructed images and MIPs were obtained and reviewed to evaluate the vascular anatomy. CONTRAST:  196mL OMNIPAQUE IOHEXOL 350 MG/ML SOLN COMPARISON:  07/23/2013 FINDINGS: CTA CHEST FINDINGS The thoracic aorta is normal in caliber. There is extensive atherosclerotic plaque. There is no dissection. Proximal great vessels are patent. There is good opacification of the pulmonary arteries with no evidence of pulmonary embolism. Review of the MIP images confirms the above findings. Nonvascular chest findings include multiple small thyroid nodules, multiple nonspecific nodes in the mediastinum and both hila, and fibrotic honeycombing in the basilar lung periphery. Central airways are patent. No pleural effusions or pericardial effusions. CTA ABDOMEN AND PELVIS FINDINGS The upper abdominal aorta is normal in caliber with extensive atherosclerotic plaque. There is mild fusiform dilatation of the infrarenal abdominal aorta to a maximum AP diameter 3.4 cm. There is mild stenosis of the celiac axis 1 cm from its origin. Superior mesenteric artery and inferior mesenteric artery are widely patent. Each kidney is served by a widely patent single main  renal artery. The common iliac and external iliac arteries are patent bilaterally. No dissection of the abdominal aorta. Review of the MIP images confirms the above findings. Nonvascular findings include extensive colonic diverticulosis without evidence of diverticulitis or other acute inflammatory process. There are unremarkable arterial phase appearances of the liver, spleen, pancreas, adrenals and kidneys. There is prior cholecystectomy. Bile ducts are unremarkable. Moderate prostatic enlargement. No significant skeletal lesions. IMPRESSION: 1. Extensive atherosclerotic plaque throughout the thoracic and abdominal aorta. There is no dissection. There is a 3.4 cm infrarenal abdominal aortic aneurysm. 2. Negative for acute pulmonary embolism. 3. Multiple tiny thyroid nodules. 4. Diverticulosis. Electronically Signed   By: Andreas Newport M.D.   On: 08/24/2015 00:34    Oren Binet, MD  [facility] Pager:[phone]  If 7PM-7AM, please contact night-coverage [email or web address] Password [email or web address] 08/29/2015, 12:58 PM   LOS: 1 day

## 2015-08-30 ENCOUNTER — Encounter (HOSPITAL_COMMUNITY)
Admission: EM | Disposition: A | Discharge status: Skilled Nursing Facility | Payer: Self-pay | Source: Home / Self Care | Attending: Internal Medicine

## 2015-08-30 ENCOUNTER — Inpatient Hospital Stay (HOSPITAL_COMMUNITY): Payer: Medicare Other

## 2015-08-30 ENCOUNTER — Encounter (HOSPITAL_COMMUNITY): Payer: Self-pay | Admitting: *Deleted

## 2015-08-30 DIAGNOSIS — A329 Listeriosis, unspecified: Secondary | ICD-10-CM

## 2015-08-30 DIAGNOSIS — I639 Cerebral infarction, unspecified: Secondary | ICD-10-CM | POA: Diagnosis not present

## 2015-08-30 DIAGNOSIS — R531 Weakness: Secondary | ICD-10-CM

## 2015-08-30 DIAGNOSIS — I10 Essential (primary) hypertension: Secondary | ICD-10-CM

## 2015-08-30 HISTORY — PX: TEE WITHOUT CARDIOVERSION: SHX5443

## 2015-08-30 LAB — BASIC METABOLIC PANEL
Anion gap: 7 (ref 5–15)
BUN: 12 mg/dL (ref 6–20)
CALCIUM: 8.2 mg/dL — AB (ref 8.9–10.3)
CO2: 26 mmol/L (ref 22–32)
CREATININE: 1.33 mg/dL — AB (ref 0.61–1.24)
Chloride: 97 mmol/L — ABNORMAL LOW (ref 101–111)
GFR, EST NON AFRICAN AMERICAN: 52 mL/min — AB (ref >60)
Glucose, Bld: 118 mg/dL — ABNORMAL HIGH (ref 65–99)
Potassium: 3.5 mmol/L (ref 3.5–5.1)
SODIUM: 130 mmol/L — AB (ref 135–145)

## 2015-08-30 LAB — PROTIME-INR
INR: 1.24 (ref 0.00–1.49)
PROTHROMBIN TIME: 15.7 s — AB (ref 11.6–15.2)

## 2015-08-30 SURGERY — ECHOCARDIOGRAM, TRANSESOPHAGEAL
Anesthesia: Moderate Sedation

## 2015-08-30 MED ORDER — SODIUM CHLORIDE 0.9 % IV SOLN
2.0000 g | Freq: Four times a day (QID) | INTRAVENOUS | Status: DC
  Administered 2015-08-30 – 2015-08-31 (×5): 2 g via INTRAVENOUS
  Filled 2015-08-30 (×8): qty 2000

## 2015-08-30 MED ORDER — ENOXAPARIN SODIUM 40 MG/0.4ML ~~LOC~~ SOLN: 40.0000 mg | SUBCUTANEOUS | Status: DC

## 2015-08-30 MED ORDER — COUMADIN BOOK
Freq: Once | Status: AC
  Administered 2015-08-31: 13:00:00
  Filled 2015-08-30 (×2): qty 1

## 2015-08-30 MED ORDER — WARFARIN SODIUM 7.5 MG PO TABS
7.5000 mg | ORAL_TABLET | Freq: Once | ORAL | Status: AC
  Administered 2015-08-30: 7.5 mg via ORAL
  Filled 2015-08-30: qty 1

## 2015-08-30 MED ORDER — DIPHENHYDRAMINE HCL 50 MG/ML IJ SOLN
INTRAMUSCULAR | Status: AC
  Filled 2015-08-30: qty 1

## 2015-08-30 MED ORDER — WARFARIN - PHARMACIST DOSING INPATIENT: Freq: Every day | Status: DC

## 2015-08-30 MED ORDER — WARFARIN VIDEO
Freq: Once | Status: AC
  Administered 2015-08-31: 13:00:00

## 2015-08-30 MED ORDER — MIDAZOLAM HCL 10 MG/2ML IJ SOLN
INTRAMUSCULAR | Status: DC | PRN
  Administered 2015-08-30: 2 mg via INTRAVENOUS

## 2015-08-30 MED ORDER — BUTAMBEN-TETRACAINE-BENZOCAINE 2-2-14 % EX AERO
INHALATION_SPRAY | CUTANEOUS | Status: DC | PRN
  Administered 2015-08-30: 2 via TOPICAL

## 2015-08-30 MED ORDER — SODIUM CHLORIDE 0.9 % IV SOLN
INTRAVENOUS | Status: DC
  Administered 2015-08-30: 19:00:00 via INTRAVENOUS

## 2015-08-30 MED ORDER — FENTANYL CITRATE (PF) 100 MCG/2ML IJ SOLN
INTRAMUSCULAR | Status: AC
  Filled 2015-08-30: qty 2

## 2015-08-30 MED ORDER — FENTANYL CITRATE (PF) 100 MCG/2ML IJ SOLN
INTRAMUSCULAR | Status: DC | PRN
  Administered 2015-08-30: 25 ug via INTRAVENOUS

## 2015-08-30 MED ORDER — MIDAZOLAM HCL 5 MG/ML IJ SOLN
INTRAMUSCULAR | Status: AC
  Filled 2015-08-30: qty 2

## 2015-08-30 NOTE — Progress Notes (Signed)
CRITICAL VALUE ALERT  Critical value received:  Positive blood culture for Listeria (not critical, just important value)  Date of notification:  12.6.16  Time of notification:  0915  Critical value read back:Yes.    Nurse who received alert:  Shan Levans, RN  MD notified (1st page): Dr. Johnnye Sima  Time of first page:  [phone]  MD notified (2nd page):   Time of second page:  Responding MD: Dr. Johnnye Sima  Time MD responded:  [phone]

## 2015-08-30 NOTE — Clinical Social Work Note (Signed)
Clinical Social Worker continuing to follow patient and family for support and discharge planning needs.  CSW spoke with patient wife over the phone who states that they have chosen [facility] at discharge.  CSW contacted facility to notify of patient and family decision.  Facility will have bed available when patient medically ready for discharge.  CSW remains available for support and to facilitate patient discharge needs.  Barbette Or, [place]

## 2015-08-30 NOTE — Evaluation (Signed)
Physical Therapy Evaluation Patient Details Name: Brett Castaneda MRN: [id number] DOB: 1941/03/22 Today's Date: 08/30/2015   History of Present Illness  74 y.o. male admitted for syncope. PMH consists of multi strokes, RA, ALS, and R eye blindness. This is pt's third hospitalization, with the first admission being 08/19/15.  Clinical Impression  Brett Castaneda progressed well w/ ambulatory activity this session, ambulating 80 ft in hallway w/ RW.  At end of session pt c/o "stinging" in chest and face, denies pain (please see general comments below for more information.  VSS.  Pt will benefit from continued skilled PT services to increase functional independence and safety.    Follow Up Recommendations SNF;Supervision/Assistance - 24 hour    Equipment Recommendations  None recommended by PT    Recommendations for Other Services       Precautions / Restrictions Precautions Precautions: Fall Restrictions Weight Bearing Restrictions: No      Mobility  Bed Mobility Overal bed mobility: Needs Assistance Bed Mobility: Supine to Sit;Sit to Supine     Supine to sit: Min guard;HOB elevated Sit to supine: Min guard   General bed mobility comments: No physical assist.  Increased time and use of bed rails.  Pt becomes hot and uncomfortable during sit>supine, see general notes below.  Transfers Overall transfer level: Needs assistance Equipment used: Rolling walker (2 wheeled) Transfers: Sit to/from Stand Sit to Stand: Min assist         General transfer comment: Min A to steady and cues for hand placement as pt attempts initially to pull on RW.  Pt is slow to rise.  Ambulation/Gait Ambulation/Gait assistance: Min guard Ambulation Distance (Feet): 80 Feet Assistive device: Rolling walker (2 wheeled) Gait Pattern/deviations: Step-through pattern;Shuffle;Antalgic;Trunk flexed;Decreased stride length;Decreased dorsiflexion - right;Decreased dorsiflexion - left Gait velocity:  Decreased Gait velocity interpretation: <1.8 ft/sec, indicative of risk for recurrent falls General Gait Details: Very dec gait speed and increased time to stop on command.  Pt reports fatigue after ambulating 40 ft and requests to turn back.    Stairs            Wheelchair Mobility    Modified Rankin (Stroke Patients Only)       Balance Overall balance assessment: Needs assistance;History of Falls Sitting-balance support: Feet supported Sitting balance-Leahy Scale: Good     Standing balance support: Bilateral upper extremity supported;During functional activity Standing balance-Leahy Scale: Poor Standing balance comment: RW for support                             Pertinent Vitals/Pain Pain Assessment: 0-10 Pain Score: 2  Pain Location: chronic back pain Pain Descriptors / Indicators: Aching Pain Intervention(s): Monitored during session;Limited activity within patient's tolerance;Repositioned    Home Living                        Prior Function                 Hand Dominance        Extremity/Trunk Assessment                         Communication      Cognition Arousal/Alertness: Lethargic;Awake/alert Behavior During Therapy: WFL for tasks assessed/performed Overall Cognitive Status: History of cognitive impairments - at baseline  General Comments General comments (skin integrity, edema, etc.): At end of session when transitioning from sit>supine pt begins c/o feeling "stingy and sweaty".  He reports he is "stinging" on his face and chest but denies pain.  Symptoms continue to slowly improve.  BP 151/86, all other VSS.  Pt oriented and able to reach to touch head w/ both UEs, protrude tongue, raise eyebrows symmetrically.  RN notifed and wife instructed to notify RN immediately if she notes any changes.      Exercises General Exercises - Lower Extremity Ankle Circles/Pumps: AROM;Both;10  reps;Seated Long Arc Quad: AROM;Both;10 reps;Seated Hip Flexion/Marching: AROM;Both;10 reps;Seated      Assessment/Plan    PT Assessment    PT Diagnosis     PT Problem List    PT Treatment Interventions     PT Goals (Current goals can be found in the Care Plan section) Acute Rehab PT Goals Patient Stated Goal: to feel better PT Goal Formulation: With patient Time For Goal Achievement: 09/10/15 Potential to Achieve Goals: Good    Frequency Min 3X/week   Barriers to discharge        Co-evaluation               End of Session Equipment Utilized During Treatment: Gait belt Activity Tolerance: Patient limited by fatigue Patient left: in bed;with call bell/phone within reach;with bed alarm set;with family/visitor present Nurse Communication: Mobility status;Other (comment) (pt's c/o "stinging and sweaty", RN to check on pt)         Time: 1420-1457 PT Time Calculation (min) (ACUTE ONLY): 37 min   Charges:     PT Treatments $Gait Training: 8-22 mins $Therapeutic Exercise: 8-22 mins   PT G Codes:       Joslyn Hy PT, DPT [phone] Pager: [phone] 08/30/2015, 4:15 PM

## 2015-08-30 NOTE — CV Procedure (Signed)
Procedure: TEE  Indication: Assess for endocarditis.  Sedation: Versed 2 mg IV, Fentanyl 25 mcg IV  Findings: Please see echo section for full report.  Normal LV size and systolic function, EF [id number].  Normal wall motion.  Normal RV size and systolic function.  Mild left atrial enlargement.  No LA appendage thrombus.  Normal RV size and systolic function.  Trivial MR, no vegetation.  Trileaflet aortic valve with no significant regurgitation, no stenosis, and no vegetation.  Trivial TR, no vegetation.  Normal pulmonic valve, no vegetation.  There was a small PFO noted by bubble study.   The aorta was normal caliber.  There was severe plaque in the arch and descending thoracic aorta.  There was mobile plaque both in the arch and the descending thoracic aorta.   Impression: No endocarditis.  However, I am concerned that severe/mobile atherosclerotic plaque in arch could have been cause of his recent CVA.   Loralie Champagne 08/30/2015 9:56 AM

## 2015-08-30 NOTE — Discharge Instructions (Signed)
Use your walker at home to assist walking until you feel fully steady on your feet. Take the meclizine for your dizziness. Increase your fluid intake. Schedule a follow-up appointment with your primary care provider. Return to the emergency department with worsening dizziness, inability to walk, falls, chest pain, shortness of breath or any other new or concerning symptoms   Dizziness Dizziness is a common problem. It is a feeling of unsteadiness or light-headedness. You may feel like you are about to faint. Dizziness can lead to injury if you stumble or fall. Anyone can become dizzy, but dizziness is more common in older adults. This condition can be caused by a number of things, including medicines, dehydration, or illness. HOME CARE INSTRUCTIONS Taking these steps may help with your condition: Eating and Drinking  Drink enough fluid to keep your urine clear or pale yellow. This helps to keep you from becoming dehydrated. Try to drink more clear fluids, such as water.  Do not drink alcohol.  Limit your caffeine intake if directed by your health care provider.  Limit your salt intake if directed by your health care provider. Activity  Avoid making quick movements.  Rise slowly from chairs and steady yourself until you feel okay.  In the morning, first sit up on the side of the bed. When you feel okay, stand slowly while you hold onto something until you know that your balance is fine.  Move your legs often if you need to stand in one place for a long time. Tighten and relax your muscles in your legs while you are standing.  Do not drive or operate heavy machinery if you feel dizzy.  Avoid bending down if you feel dizzy. Place items in your home so that they are easy for you to reach without leaning over. Lifestyle  Do not use any tobacco products, including cigarettes, chewing tobacco, or electronic cigarettes. If you need help quitting, ask your health care provider.  Try to reduce  your stress level, such as with yoga or meditation. Talk with your health care provider if you need help. General Instructions  Watch your dizziness for any changes.  Take medicines only as directed by your health care provider. Talk with your health care provider if you think that your dizziness is caused by a medicine that you are taking.  Tell a friend or a family member that you are feeling dizzy. If he or she notices any changes in your behavior, have this person call your health care provider.  Keep all follow-up visits as directed by your health care provider. This is important. SEEK MEDICAL CARE IF:  Your dizziness does not go away.  Your dizziness or light-headedness gets worse.  You feel nauseous.  You have reduced hearing.  You have new symptoms.  You are unsteady on your feet or you feel like the room is spinning. SEEK IMMEDIATE MEDICAL CARE IF:  You vomit or have diarrhea and are unable to eat or drink anything.  You have problems talking, walking, swallowing, or using your arms, hands, or legs.  You feel generally weak.  You are not thinking clearly or you have trouble forming sentences. It may take a friend or family member to notice this.  You have chest pain, abdominal pain, shortness of breath, or sweating.  Your vision changes.  You notice any bleeding.  You have a headache.  You have neck pain or a stiff neck.  You have a fever.   This information is not intended  to replace advice given to you by your health care provider. Make sure you discuss any questions you have with your health care provider.   Document Released: 03/06/2001 Document Revised: 01/25/2015 Document Reviewed: 09/06/2014 Elsevier Interactive Patient Education 2016 [place] on my medicine - Coumadin   (Warfarin)  This medication education was reviewed with me or my healthcare representative as part of my discharge preparation.  The pharmacist that spoke with  me during my hospital stay was:  Romona Curls, [facility]  Why was Coumadin prescribed for you? Coumadin was prescribed for you because you have a blood clot or a medical condition that can cause an increased risk of forming blood clots. Blood clots can cause serious health problems by blocking the flow of blood to the heart, lung, or brain. Coumadin can prevent harmful blood clots from forming. As a reminder your indication for Coumadin is:   Select from menu  What test will check on my response to Coumadin? While on Coumadin (warfarin) you will need to have an INR test regularly to ensure that your dose is keeping you in the desired range. The INR (international normalized ratio) number is calculated from the result of the laboratory test called prothrombin time (PT).  If an INR APPOINTMENT HAS NOT ALREADY BEEN MADE FOR YOU please schedule an appointment to have this lab work done by your health care provider within 7 days. Your INR goal is usually a number between:  2 to 3 or your provider may give you a more narrow range like 2-2.5.  Ask your health care provider during an office visit what your goal INR is.  What  do you need to  know  About  COUMADIN? Take Coumadin (warfarin) exactly as prescribed by your healthcare provider about the same time each day.  DO NOT stop taking without talking to the doctor who prescribed the medication.  Stopping without other blood clot prevention medication to take the place of Coumadin may increase your risk of developing a new clot or stroke.  Get refills before you run out.  What do you do if you miss a dose? If you miss a dose, take it as soon as you remember on the same day then continue your regularly scheduled regimen the next day.  Do not take two doses of Coumadin at the same time.  Important Safety Information A possible side effect of Coumadin (Warfarin) is an increased risk of bleeding. You should call your healthcare provider right away if you  experience any of the following: ? Bleeding from an injury or your nose that does not stop. ? Unusual colored urine (red or dark brown) or unusual colored stools (red or black). ? Unusual bruising for unknown reasons. ? A serious fall or if you hit your head (even if there is no bleeding).  Some foods or medicines interact with Coumadin (warfarin) and might alter your response to warfarin. To help avoid this: ? Eat a balanced diet, maintaining a consistent amount of Vitamin K. ? Notify your provider about major diet changes you plan to make. ? Avoid alcohol or limit your intake to 1 drink for women and 2 drinks for men per day. (1 drink is 5 oz. wine, 12 oz. beer, or 1.5 oz. liquor.)  Make sure that ANY health care provider who prescribes medication for you knows that you are taking Coumadin (warfarin).  Also make sure the healthcare provider who is monitoring your Coumadin knows when you have started  a new medication including herbals and non-prescription products.  Coumadin (Warfarin)  Major Drug Interactions  Increased Warfarin Effect Decreased Warfarin Effect  Alcohol (large quantities) Antibiotics (esp. Septra/Bactrim, Flagyl, Cipro) Amiodarone (Cordarone) Aspirin (ASA) Cimetidine (Tagamet) Megestrol (Megace) NSAIDs (ibuprofen, naproxen, etc.) Piroxicam (Feldene) Propafenone (Rythmol SR) Propranolol (Inderal) Isoniazid (INH) Posaconazole (Noxafil) Barbiturates (Phenobarbital) Carbamazepine (Tegretol) Chlordiazepoxide (Librium) Cholestyramine (Questran) Griseofulvin Oral Contraceptives Rifampin Sucralfate (Carafate) Vitamin K   Coumadin (Warfarin) Major Herbal Interactions  Increased Warfarin Effect Decreased Warfarin Effect  Garlic Ginseng Ginkgo biloba Coenzyme Q10 Green tea St. Johns wort    Coumadin (Warfarin) FOOD Interactions  Eat a consistent number of servings per week of foods HIGH in Vitamin K (1 serving =  cup)  Collards (cooked, or boiled &  drained) Kale (cooked, or boiled & drained) Mustard greens (cooked, or boiled & drained) Parsley *serving size only =  cup Spinach (cooked, or boiled & drained) Swiss chard (cooked, or boiled & drained) Turnip greens (cooked, or boiled & drained)  Eat a consistent number of servings per week of foods MEDIUM-HIGH in Vitamin K (1 serving = 1 cup)  Asparagus (cooked, or boiled & drained) Broccoli (cooked, boiled & drained, or raw & chopped) Brussel sprouts (cooked, or boiled & drained) *serving size only =  cup Lettuce, raw (green leaf, endive, romaine) Spinach, raw Turnip greens, raw & chopped   These websites have more information on Coumadin (warfarin):  FailFactory.se; VeganReport.com.au;

## 2015-08-30 NOTE — Interval H&P Note (Signed)
History and Physical Interval Note:  08/30/2015 9:26 AM  Brett Castaneda  has presented today for surgery, with the diagnosis of STROKE  The various methods of treatment have been discussed with the patient and family. After consideration of risks, benefits and other options for treatment, the patient has consented to  Procedure(s): TRANSESOPHAGEAL ECHOCARDIOGRAM (TEE) (N/A) as a surgical intervention .  The patient's history has been reviewed, patient examined, no change in status, stable for surgery.  I have reviewed the patient's chart and labs.  Questions were answered to the patient's satisfaction.     [name] [employer]

## 2015-08-30 NOTE — Progress Notes (Signed)
PATIENT DETAILS Name: Brett Castaneda Age: 74 y.o. Sex: male Date of Birth: 05/16/1941 Admit Date: 08/26/2015 Admitting Physician Theressa Millard, MD [id number] [name], MD  Subjective: No dizziness noted on sitting up, back pain only minimal. Daughter at bedside  Assessment/Plan: Principal Problem: Recurrent Syncope: EKG/telemetry negative. Orthostatics on admission negative.Loop recorder interrogation negative for arrhythmias.Recent transthoracic echocardiogram on 11/26 showed preserved ejection fraction.I suspect that this is most likely related to fever/bacteremia/neuropathy issues at this time. Continue telemetry monitoring. Follow  Active Problems: Listeria Bacteremia: Found to be febrile this admission, initially one set of blood culture was positive for gram variable rod-however this is not, back positive for listeria. Infectious disease was consulted, initially patient was on vancomycin and Zosyn, recommendations are not switched to ampicillin for a total of 2 weeks. Patient is not encephalopathic, no signs of meningitis-infectious disease does not recommend a lumbar puncture. TEE was negative for endocarditis.   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  Recent CVA: Nonfocal exam. Continue aspirin, statin. 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 Case was discussed with stroke M.D. On 12/6-Dr. Carren Rang reviewed the chart extensively-reviewed TEE and radiological findings. He recommends Coumadin with aspirin bridge until INR therapeutic-he recommends Coumadin for at least 3 months and a repeat TEE and around 3 months time before discontinuing Coumadin. Plan is to continue aspirin. INR is therapeutic.  ARF:? Etiology. Euvolemic on exam, continue to monitor off IV fluids. Follow  electrolytes  Back Pain: much better today-suspect this was secondary to fall/syncope-(hit his back) that occurred on admission. Much better with supportive measures-since improving-suspect we can hold of scanning spine.   Scalp laceration: Secondary to fall-remove sutures in 1 week. TDaP received on 11/29  Hypertension: Controlled-continue amlodipine  Dyslipidemia: Continue statin  History of unspecified peripheral neuropathy:? Related to methotrexate  ? ALS: Reviewed prior note from Dr. Leonie Man on 11/29-he doubts ALS.  History of rheumatoid arthritis: Hold methotrexate-given fever/bacteremia  Deconditioning/frequent falls: PT evaluation- SNF placement.Consult social workl  Disposition: Remain inpatient-SNF tomorrow?  Antimicrobial agents  See below  Anti-infectives    Start     Dose/Rate Route Frequency Ordered Stop   08/30/15 1200  ampicillin (OMNIPEN) 2 g in sodium chloride 0.9 % 50 mL IVPB     2 g 150 mL/hr over 20 Minutes Intravenous 4 times per day 08/30/15 0940     08/29/15 0200  [MAR Hold]  vancomycin (VANCOCIN) IVPB 1000 mg/200 mL premix  Status:  Discontinued     (MAR Hold since 08/30/15 0822)   1,000 mg 200 mL/hr over 60 Minutes Intravenous Every 12 hours 08/28/15 1337 08/30/15 0940   08/28/15 1400  [MAR Hold]  piperacillin-tazobactam (ZOSYN) IVPB 3.375 g  Status:  Discontinued     (MAR Hold since 08/30/15 0822)   3.375 g 12.5 mL/hr over 240 Minutes Intravenous Every 8 hours 08/28/15 1337 08/30/15 0940   08/28/15 1400  vancomycin (VANCOCIN) 1,750 mg in sodium chloride 0.9 % 500 mL IVPB     1,750 mg 250 mL/hr over 120 Minutes Intravenous  Once 08/28/15 1337 08/28/15 1558      DVT Prophylaxis: Prophylactic Lovenox  Code Status:  DNR  Family Communication None  at bedside  Procedures: None  CONSULTS:  None  Time spent 30 minutes-Greater than 50% of this time was spent  in counseling, explanation of diagnosis, planning of further management, and  coordination of care.  MEDICATIONS: Scheduled Meds: . amLODipine  5 mg Oral Daily  . ampicillin (OMNIPEN) IV  2 g Intravenous 4 times per day  . aspirin EC  325 mg Oral Daily  . enoxaparin (LOVENOX) injection  40 mg Subcutaneous Q24H  . folic acid-pyridoxine-cyancobalamin  1 tablet Oral Q Sat  . latanoprost  1 drop Left Eye QHS  . multivitamin-lutein  1 capsule Oral Daily  . pantoprazole  40 mg Oral Q1200  . simvastatin  40 mg Oral q1800  . sodium chloride  3 mL Intravenous Q12H  . timolol  1 drop Left Eye QPM   Continuous Infusions: . sodium chloride 10 mL/hr at 08/28/15 1005   PRN Meds:.acetaminophen **OR** acetaminophen, alum & mag hydroxide-simeth, hydrALAZINE, HYDROmorphone (DILAUDID) injection, ibuprofen, meclizine, ondansetron **OR** ondansetron (ZOFRAN) IV, oxyCODONE, polyethylene glycol    PHYSICAL EXAM: Vital signs in last 24 hours: Filed Vitals:   08/30/15 0955 08/30/15 1007 08/30/15 1010 08/30/15 1020  BP: 142/92 129/83 135/78 131/81  Pulse: 80 78 78 76  Temp:  98.2 F (36.8 C)    TempSrc:  Oral    Resp: 14 18 15 17   Height:      Weight:      SpO2: 98% 94% 95% 94%    Weight change: 2.087 kg (4 lb 9.6 oz) Filed Weights   08/28/15 0505 08/29/15 0500 08/30/15 0430  Weight: 76.204 kg (168 lb) 73.755 kg (162 lb 9.6 oz) 75.841 kg (167 lb 3.2 oz)   Body mass index is 23.33 kg/(m^2).   Gen Exam: Awake and alert with clear speech.  Neck: Supple, No JVD.   Chest: B/L Clear.   CVS: S1 S2 Regular, no murmurs.  Abdomen: soft, BS +, non tender, non distended.  Extremities: no edema, lower extremities warm to touch. Neurologic: Non Focal-5/5 in all 4 extremities  Skin: No Rash.   Wounds: N/A.   Intake/Output from previous day:  Intake/Output Summary (Last 24 hours) at 08/30/15 1109 Last data filed at 08/30/15 0631  Gross per 24 hour  Intake    950 ml  Output   1600 ml  Net   -650 ml     LAB RESULTS: CBC  Recent Labs Lab 08/24/15 0427 08/26/15 1502  08/27/15 0634 08/28/15 0816 08/29/15 0447  WBC 6.7 4.5 5.4 6.6 6.8  HGB 11.0* 12.5* 12.3* 11.9* 12.3*  HCT 32.8* 36.5* 36.9* 35.5* 35.6*  PLT 212 256 234 219 237  MCV 92.4 92.4 92.7 91.5 91.3  MCH 31.0 31.6 30.9 30.7 31.5  MCHC 33.5 34.2 33.3 33.5 34.6  RDW 13.6 13.8 13.7 13.8 14.1  LYMPHSABS 1.4 1.1  --   --   --   MONOABS 0.3 0.3  --   --   --   EOSABS 0.1 0.1  --   --   --   BASOSABS 0.0 0.0  --   --   --     Chemistries   Recent Labs Lab 08/24/15 1017 08/26/15 1502 08/27/15 0634 08/28/15 0816 08/29/15 0447 08/30/15 0405  NA  --  132* 134* 130* 129* 130*  K  --  3.6 3.9 3.5 3.8 3.5  CL  --  100* 103 101 98* 97*  CO2  --  24 25 22 24 26   GLUCOSE  --  124* 105* 124* 135* 118*  BUN  --  11 8 9 9 12   CREATININE  --  1.17 1.12 1.07  1.24 1.33*  CALCIUM  --  8.4* 8.3* 8.1* 8.0* 8.2*  MG 1.8  --   --   --   --   --     CBG: No results for input(s): GLUCAP in the last 168 hours.  GFR Estimated Creatinine Clearance: 53.5 mL/min (by C-G formula based on Cr of 1.33).  Coagulation profile  Recent Labs Lab 08/23/15 2202  INR 1.11    Cardiac Enzymes  Recent Labs Lab 08/27/15 0135 08/27/15 0634 08/27/15 1255  TROPONINI 0.03 0.03 0.03    Invalid input(s): POCBNP No results for input(s): DDIMER in the last 72 hours. No results for input(s): HGBA1C in the last 72 hours. No results for input(s): CHOL, HDL, LDLCALC, TRIG, CHOLHDL, LDLDIRECT in the last 72 hours. No results for input(s): TSH, T4TOTAL, T3FREE, THYROIDAB in the last 72 hours.  Invalid input(s): FREET3 No results for input(s): VITAMINB12, FOLATE, FERRITIN, TIBC, IRON, RETICCTPCT in the last 72 hours. No results for input(s): LIPASE, AMYLASE in the last 72 hours.  Urine Studies No results for input(s): UHGB, CRYS in the last 72 hours.  Invalid input(s): UACOL, UAPR, USPG, UPH, UTP, UGL, UKET, UBIL, UNIT, UROB, ULEU, UEPI, UWBC, URBC, UBAC, CAST, UCOM, BILUA  MICROBIOLOGY: Recent Results (from  the past 240 hour(s))  Urine culture     Status: None   Collection Time: 08/27/15  9:25 AM  Result Value Ref Range Status   Specimen Description URINE, RANDOM  Final   Special Requests UR BAG PED  Final   Culture MULTIPLE SPECIES PRESENT, SUGGEST RECOLLECTION  Final   Report Status 08/28/2015 FINAL  Final  Culture, blood (routine x 2)     Status: None (Preliminary result)   Collection Time: 08/27/15  9:40 AM  Result Value Ref Range Status   Specimen Description BLOOD LEFT ANTECUBITAL  Final   Special Requests BOTTLES DRAWN AEROBIC AND ANAEROBIC 10CC  Final   Culture NO GROWTH 2 DAYS  Final   Report Status PENDING  Incomplete  Culture, blood (routine x 2)     Status: None (Preliminary result)   Collection Time: 08/27/15  9:43 AM  Result Value Ref Range Status   Specimen Description BLOOD RIGHT WRIST  Final   Special Requests BOTTLES DRAWN AEROBIC AND ANAEROBIC 10CC  Final   Culture  Setup Time   Final    GRAM VARIABLE ROD AEROBIC BOTTLE ONLY CRITICAL RESULT CALLED TO, READ BACK BY AND VERIFIED WITH: J. ODDONO,RN AT [id number] ON [id number] BY Rhea Bleacher    Culture   Final    LISTERIA MONOCYTOGENES Results Called to: L HONEYCUTT,RN AT 0911 08/30/15 BY L BENFIELD Standardized susceptibility testing for this organism is not available.    Report Status PENDING  Incomplete  Culture, blood (routine x 2)     Status: None (Preliminary result)   Collection Time: 08/28/15 10:10 AM  Result Value Ref Range Status   Specimen Description BLOOD RIGHT HAND  Final   Special Requests BOTTLES DRAWN AEROBIC AND ANAEROBIC 10CC  Final   Culture NO GROWTH 1 DAY  Final   Report Status PENDING  Incomplete  Culture, blood (routine x 2)     Status: None (Preliminary result)   Collection Time: 08/28/15 10:16 AM  Result Value Ref Range Status   Specimen Description BLOOD LEFT ANTECUBITAL  Final   Special Requests BOTTLES DRAWN AEROBIC AND ANAEROBIC 10CC  Final   Culture NO GROWTH 1 DAY  Final   Report Status  PENDING  Incomplete  RADIOLOGY STUDIES/RESULTS: Dg Chest 2 View  08/19/2015  CLINICAL DATA:  Stroke symptoms tonight. Fever. Hypertension. Ex-smoker. EXAM: CHEST  2 VIEW COMPARISON:  10/28/2013 FINDINGS: Mild cardiac enlargement without vascular congestion. Interstitial changes in the lung bases similar to previous study, likely fibrosis. No focal airspace disease. No blunting of costophrenic angles. No pneumothorax. Calcified and tortuous aorta. Surgical clips in the right upper quadrant. 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 Mild cardiac enlargement. No evidence of active pulmonary disease. Electronically Signed   By: Lucienne Capers M.D.   On: 08/19/2015 23:02   Ct Head Wo Contrast  08/24/2015  CLINICAL DATA:  74 year old male with syncope. EXAM: CT HEAD WITHOUT CONTRAST TECHNIQUE: Contiguous axial images were obtained from the base of the skull through the vertex without intravenous contrast. COMPARISON:  Brain CT and MRI dated 08/19/2015 FINDINGS: The ventricles are dilated and the sulci are prominent compatible with age-related atrophy. Periventricular and deep white matter hypodensities represent chronic microvascular ischemic changes. Stable right frontal lobe old infarct and encephalomalacia. There is no intracranial hemorrhage. No mass effect or midline shift identified. The visualized paranasal sinuses and mastoid air cells are well aerated. The calvarium is intact. There is a stable calcific changes of the right globe. IMPRESSION: No acute intracranial pathology. Age-related atrophy and chronic microvascular ischemic disease. Stable right frontal lobe old infarct and encephalomalacia. If symptoms persist and there are no contraindications, MRI may provide better evaluation if clinically indicated. Electronically Signed   By: Anner Crete M.D.   On: 08/24/2015 00:29   Ct Head Wo Contrast  08/19/2015  CLINICAL DATA:  Altered mental status.  No weakness.  Code stroke. EXAM: CT HEAD  WITHOUT CONTRAST TECHNIQUE: Contiguous axial images were obtained from the base of the skull through the vertex without intravenous contrast. COMPARISON:  07/16/2013 FINDINGS: Diffuse cerebral atrophy. No ventricular dilatation. Low-attenuation changes in the deep white matter consistent with small vessel ischemia. Focal encephalomalacia in the right frontal lobe consistent with old infarct. No mass effect or midline shift. No abnormal extra-axial fluid collections. Gray-white matter junctions are distinct. Basal cisterns are not effaced. No evidence of acute intracranial hemorrhage. No depressed skull fractures. Mild mucosal thickening in the paranasal sinuses. Mastoid air cells are not opacified. Vascular calcifications. Calcification and deformity of the right globe consistent with old insult. Focal increased density in the left globe likely postoperative. IMPRESSION: No acute intracranial abnormalities. Chronic atrophy and small vessel ischemic changes. Old right frontal infarct. These results were called by telephone at the time of interpretation on 08/19/2015 at 8:38 pm to Dr. Leonel Ramsay, who verbally acknowledged these results. Electronically Signed   By: Lucienne Capers M.D.   On: 08/19/2015 20:41   [name]  08/19/2015  CLINICAL DATA:  Initial valuation for acute altered mental status. EXAM: MRI HEAD WITHOUT CONTRAST TECHNIQUE: Multiplanar, multiecho pulse sequences of the brain and surrounding structures were obtained without intravenous contrast. COMPARISON:  Prior head CT from earlier the same day P FINDINGS: Diffusion-weighted sequences demonstrate a possible tiny 3 mm cortical infarct within the left temporal lobe (series 3, image 20). No associated edema or hemorrhage. No other infarct. Largely absent flow void within the diminutive right vertebral artery, stable. Major intracranial vascular flow voids are otherwise maintained. Diffuse prominence of the CSF containing spaces is  compatible with generalized age-related cerebral atrophy. Mild chronic small vessel ischemic disease in within the periventricular white matter. Encephalomalacia within the high right frontal lobe consistent with remote ischemic infarct. There is  associated gliosis within this region. Multiple additional remote lacunar infarcts within the right basal ganglia involving both the caudate and lentiform nuclei. No mass lesion, midline shift, or mass effect. No hydrocephalus. No extra-axial fluid collection. Pituitary gland normal. No acute abnormality about the globes. 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 Mild mucosal thickening within the ethmoidal air cells and maxillary sinuses. No mastoid effusion. Inner ear structures normal. Bone marrow signal intensity within normal limits. No scalp soft tissue abnormality. IMPRESSION: 1. 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 2. No other acute intracranial process identified. 3. Moderate to large remote infarct within the posterior right frontal lobe with extension into the operculum. Additional remote lacunar infarcts involving the right basal ganglia. 4. Nonvisualization of the right vertebral artery, which may be occluded. This is stable relative to previous MRI from 2014. 5. Atrophy with mild chronic small vessel ischemic disease. Electronically Signed   By: Jeannine Boga M.D.   On: 08/19/2015 23:14   Dg Chest Port 1 View  08/27/2015  CLINICAL DATA:  Fever with back pain. History of ALS, CVA and rheumatoid arthritis. EXAM: PORTABLE CHEST 1 VIEW COMPARISON:  CT and radiographs 08/23/2015. FINDINGS: 0831 hours. There are lower lung volumes. Patient has chronic fibrotic changes at both lung bases with probable mild superimposed atelectasis related to the lower lung volumes. There is no consolidation or significant pleural effusion. The heart size and mediastinal  contours are stable. IMPRESSION: Stable fibrotic changes at both lung bases with mild superimposed atelectasis. No evidence of pneumonia. Electronically Signed   By: Richardean Sale M.D.   On: 08/27/2015 10:45   Dg Chest Portable 1 View  08/23/2015  CLINICAL DATA:  Initial evaluation for 2 episodes of syncope today, nausea, weakness. EXAM: PORTABLE CHEST 1 VIEW COMPARISON:  Prior radiograph from 08/19/2015. FINDINGS: Cardiomegaly is stable from prior. Mediastinal silhouette within normal limits. Tortuosity intrathoracic aorta with atheromatous plaque within the aortic arch noted, stable. Lungs are hypoinflated with bibasilar fibrotic changes, left greater than right Ing, stable. No focal infiltrates. No pulmonary edema or pleural effusion. No pneumothorax. No acute osseus abnormality. IMPRESSION: Stable appearance of the chest with left greater than right bibasilar fibrosis and mild cardiomegaly. No active cardiopulmonary disease. Electronically Signed   By: Jeannine Boga M.D.   On: 08/23/2015 22:39   Ct Angio Chest Aorta W/cm &/or Wo/cm  08/24/2015  CLINICAL DATA:  [place] today after becoming lightheaded. Was discharged from the hospital earlier today. EXAM: CT ANGIOGRAPHY CHEST, ABDOMEN AND PELVIS TECHNIQUE: Multidetector CT imaging through the chest, abdomen and pelvis was performed using the standard protocol during bolus administration of intravenous contrast. Multiplanar reconstructed images and MIPs were obtained and reviewed to evaluate the vascular anatomy. CONTRAST:  128mL OMNIPAQUE IOHEXOL 350 MG/ML SOLN COMPARISON:  07/23/2013 FINDINGS: CTA CHEST FINDINGS The thoracic aorta is normal in caliber. There is extensive atherosclerotic plaque. There is no dissection. Proximal great vessels are patent. There is good opacification of the pulmonary arteries with no evidence of pulmonary embolism. Review of the MIP images confirms the above findings. Nonvascular chest findings include multiple small  thyroid nodules, multiple nonspecific nodes in the mediastinum and both hila, and fibrotic honeycombing in the basilar lung periphery. Central airways are patent. No pleural effusions or pericardial effusions. CTA ABDOMEN AND PELVIS FINDINGS The upper abdominal aorta is normal in caliber with extensive atherosclerotic plaque. There is mild fusiform dilatation of the infrarenal abdominal  aorta to a maximum AP diameter 3.4 cm. There is mild stenosis of the celiac axis 1 cm from its origin. Superior mesenteric artery and inferior mesenteric artery are widely patent. Each kidney is served by a widely patent single main renal artery. The common iliac and external iliac arteries are patent bilaterally. No dissection of the abdominal aorta. Review of the MIP images confirms the above findings. Nonvascular findings include extensive colonic diverticulosis without evidence of diverticulitis or other acute inflammatory process. There are unremarkable arterial phase appearances of the liver, spleen, pancreas, adrenals and kidneys. There is prior cholecystectomy. Bile ducts are unremarkable. Moderate prostatic enlargement. No significant skeletal lesions. IMPRESSION: 1. Extensive atherosclerotic plaque throughout the thoracic and abdominal aorta. There is no dissection. There is a 3.4 cm infrarenal abdominal aortic aneurysm. 2. Negative for acute pulmonary embolism. 3. Multiple tiny thyroid nodules. 4. Diverticulosis. Electronically Signed   By: Andreas Newport M.D.   On: 08/24/2015 00:34   Ct Angio Abd/pel W/ And/or W/o  08/24/2015  CLINICAL DATA:  [place] today after becoming lightheaded. Was discharged from the hospital earlier today. EXAM: CT ANGIOGRAPHY CHEST, ABDOMEN AND PELVIS TECHNIQUE: Multidetector CT imaging through the chest, abdomen and pelvis was performed using the standard protocol during bolus administration of intravenous contrast. Multiplanar reconstructed images and MIPs were obtained and reviewed to  evaluate the vascular anatomy. CONTRAST:  134mL OMNIPAQUE IOHEXOL 350 MG/ML SOLN COMPARISON:  07/23/2013 FINDINGS: CTA CHEST FINDINGS The thoracic aorta is normal in caliber. There is extensive atherosclerotic plaque. There is no dissection. Proximal great vessels are patent. There is good opacification of the pulmonary arteries with no evidence of pulmonary embolism. Review of the MIP images confirms the above findings. Nonvascular chest findings include multiple small thyroid nodules, multiple nonspecific nodes in the mediastinum and both hila, and fibrotic honeycombing in the basilar lung periphery. Central airways are patent. No pleural effusions or pericardial effusions. CTA ABDOMEN AND PELVIS FINDINGS The upper abdominal aorta is normal in caliber with extensive atherosclerotic plaque. There is mild fusiform dilatation of the infrarenal abdominal aorta to a maximum AP diameter 3.4 cm. There is mild stenosis of the celiac axis 1 cm from its origin. Superior mesenteric artery and inferior mesenteric artery are widely patent. Each kidney is served by a widely patent single main renal artery. The common iliac and external iliac arteries are patent bilaterally. No dissection of the abdominal aorta. Review of the MIP images confirms the above findings. Nonvascular findings include extensive colonic diverticulosis without evidence of diverticulitis or other acute inflammatory process. There are unremarkable arterial phase appearances of the liver, spleen, pancreas, adrenals and kidneys. There is prior cholecystectomy. Bile ducts are unremarkable. Moderate prostatic enlargement. No significant skeletal lesions. IMPRESSION: 1. Extensive atherosclerotic plaque throughout the thoracic and abdominal aorta. There is no dissection. There is a 3.4 cm infrarenal abdominal aortic aneurysm. 2. Negative for acute pulmonary embolism. 3. Multiple tiny thyroid nodules. 4. Diverticulosis. Electronically Signed   By: Andreas Newport M.D.   On: 08/24/2015 00:34    Oren Binet, MD  [facility] Pager:[phone]  If 7PM-7AM, please contact night-coverage [email or web address] Password [email or web address] 08/30/2015, 11:09 AM   LOS: 2 days

## 2015-08-30 NOTE — Progress Notes (Addendum)
INFECTIOUS DISEASE PROGRESS NOTE  ID: Brett Castaneda is a 74 y.o. male with  Principal Problem:   Syncope Active Problems:   Dyslipidemia   Essential hypertension   Rheumatoid arthritis ([place])   ALS (amyotrophic lateral sclerosis) (HCC)   Cerebral infarction due to unspecified mechanism   Fall   Scalp laceration   Weakness   Bacteremia  Subjective: Without complaint, drinking coffee  Abtx:  Anti-infectives    Start     Dose/Rate Route Frequency Ordered Stop   08/30/15 1200  ampicillin (OMNIPEN) 2 g in sodium chloride 0.9 % 50 mL IVPB     2 g 150 mL/hr over 20 Minutes Intravenous 4 times per day 08/30/15 0940     08/29/15 0200  [MAR Hold]  vancomycin (VANCOCIN) IVPB 1000 mg/200 mL premix  Status:  Discontinued     (MAR Hold since 08/30/15 0822)   1,000 mg 200 mL/hr over 60 Minutes Intravenous Every 12 hours 08/28/15 1337 08/30/15 0940   08/28/15 1400  [MAR Hold]  piperacillin-tazobactam (ZOSYN) IVPB 3.375 g  Status:  Discontinued     (MAR Hold since 08/30/15 0822)   3.375 g 12.5 mL/hr over 240 Minutes Intravenous Every 8 hours 08/28/15 1337 08/30/15 0940   08/28/15 1400  vancomycin (VANCOCIN) 1,750 mg in sodium chloride 0.9 % 500 mL IVPB     1,750 mg 250 mL/hr over 120 Minutes Intravenous  Once 08/28/15 1337 08/28/15 1558      Medications:  Scheduled: . amLODipine  5 mg Oral Daily  . ampicillin (OMNIPEN) IV  2 g Intravenous 4 times per day  . aspirin EC  325 mg Oral Daily  . enoxaparin (LOVENOX) injection  40 mg Subcutaneous Q24H  . folic acid-pyridoxine-cyancobalamin  1 tablet Oral Q Sat  . latanoprost  1 drop Left Eye QHS  . multivitamin-lutein  1 capsule Oral Daily  . pantoprazole  40 mg Oral Q1200  . simvastatin  40 mg Oral q1800  . sodium chloride  3 mL Intravenous Q12H  . timolol  1 drop Left Eye QPM    Objective: Vital signs in last 24 hours: Temp:  [98.2 F (36.8 C)-101 F (38.3 C)] 98.2 F (36.8 C) (12/06 1007) Pulse Rate:  [71-99] 76 (12/06  1020) Resp:  [11-26] 17 (12/06 1020) BP: (129-185)/(78-99) 131/81 mmHg (12/06 1020) SpO2:  [94 %-100 %] 94 % (12/06 1020) Weight:  [75.841 kg (167 lb 3.2 oz)] 75.841 kg (167 lb 3.2 oz) (12/06 0430)   General appearance: alert, cooperative and no distress Eyes: R eye opaque. no photophobia.  Neck: no adenopathy, supple, symmetrical, trachea midline and FROM, no resistance to movement.  Resp: clear to auscultation bilaterally Cardio: regular rate and rhythm GI: normal findings: bowel sounds normal and soft, non-tender  L occipital scalp wound is clean, sutures in place.  R parietal wound with 2 staples in place, clean.   Lab Results  Recent Labs  08/28/15 0816 08/29/15 0447 08/30/15 0405  WBC 6.6 6.8  --   HGB 11.9* 12.3*  --   HCT 35.5* 35.6*  --   NA 130* 129* 130*  K 3.5 3.8 3.5  CL 101 98* 97*  CO2 22 24 26   BUN 9 9 12   CREATININE 1.07 1.24 1.33*   Liver Panel No results for input(s): PROT, ALBUMIN, AST, ALT, ALKPHOS, BILITOT, BILIDIR, IBILI in the last 72 hours. Sedimentation Rate No results for input(s): ESRSEDRATE in the last 72 hours. C-Reactive Protein No results for input(s): CRP in  the last 72 hours.  Microbiology: Recent Results (from the past 240 hour(s))  Urine culture     Status: None   Collection Time: 08/27/15  9:25 AM  Result Value Ref Range Status   Specimen Description URINE, RANDOM  Final   Special Requests UR BAG PED  Final   Culture MULTIPLE SPECIES PRESENT, SUGGEST RECOLLECTION  Final   Report Status 08/28/2015 FINAL  Final  Culture, blood (routine x 2)     Status: None (Preliminary result)   Collection Time: 08/27/15  9:40 AM  Result Value Ref Range Status   Specimen Description BLOOD LEFT ANTECUBITAL  Final   Special Requests BOTTLES DRAWN AEROBIC AND ANAEROBIC 10CC  Final   Culture NO GROWTH 2 DAYS  Final   Report Status PENDING  Incomplete  Culture, blood (routine x 2)     Status: None (Preliminary result)   Collection Time: 08/27/15   9:43 AM  Result Value Ref Range Status   Specimen Description BLOOD RIGHT WRIST  Final   Special Requests BOTTLES DRAWN AEROBIC AND ANAEROBIC 10CC  Final   Culture  Setup Time   Final    GRAM VARIABLE ROD AEROBIC BOTTLE ONLY CRITICAL RESULT CALLED TO, READ BACK BY AND VERIFIED WITH: J. ODDONO,RN AT [id number] ON [id number] BY Rhea Bleacher    Culture   Final    LISTERIA MONOCYTOGENES Results Called to: L HONEYCUTT,RN AT 0911 08/30/15 BY L BENFIELD Standardized susceptibility testing for this organism is not available.    Report Status PENDING  Incomplete  Culture, blood (routine x 2)     Status: None (Preliminary result)   Collection Time: 08/28/15 10:10 AM  Result Value Ref Range Status   Specimen Description BLOOD RIGHT HAND  Final   Special Requests BOTTLES DRAWN AEROBIC AND ANAEROBIC 10CC  Final   Culture NO GROWTH 1 DAY  Final   Report Status PENDING  Incomplete  Culture, blood (routine x 2)     Status: None (Preliminary result)   Collection Time: 08/28/15 10:16 AM  Result Value Ref Range Status   Specimen Description BLOOD LEFT ANTECUBITAL  Final   Special Requests BOTTLES DRAWN AEROBIC AND ANAEROBIC 10CC  Final   Culture NO GROWTH 1 DAY  Final   Report Status PENDING  Incomplete    Studies/Results: No results found.   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  TEE does not show IE, does show mobile plaque on aorta as potential cause of CVA.  Agree with change anbx to ampicillin. Aim for 14 days of rx/ampicillin Would not get LP Available as needed.   Total days of antibiotics: 2 vanco/zosyn --> ampicillin         [name] Infectious Diseases (pager) [phone] [email or web address] 08/30/2015, 10:49 AM  LOS: 2 days

## 2015-08-30 NOTE — Progress Notes (Addendum)
ANTIBIOTIC CONSULT NOTE - INITIAL  Pharmacy Consult for ampicillin Indication: listeria bacteremia  Allergies  Allergen Reactions  . Dopamine Anaphylaxis  . Morphine And Related Other (See Comments)    Hallucinations, very bad reactions  . Vicodin [Hydrocodone-Acetaminophen] Nausea And Vomiting    Patient Measurements: Height: 5\' 11"  (180.3 cm) Weight: 167 lb 3.2 oz (75.841 kg) IBW/kg (Calculated) : 75.3  Vital Signs: Temp: 98.3 F (36.8 C) (12/06 0829) Temp Source: Oral (12/06 0829) BP: 152/87 mmHg (12/06 0935) Pulse Rate: 80 (12/06 0935) Intake/Output from previous day: 12/05 0701 - 12/06 0700 In: 950 [P.O.:400; IV Piggyback:550] Out: 1600 [Urine:1600] Intake/Output from this shift:    Labs:  Recent Labs  08/28/15 0816 08/29/15 0447 08/30/15 0405  WBC 6.6 6.8  --   HGB 11.9* 12.3*  --   PLT 219 237  --   CREATININE 1.07 1.24 1.33*   Estimated Creatinine Clearance: 53.5 mL/min (by C-G formula based on Cr of 1.33). No results for input(s): VANCOTROUGH, VANCOPEAK, VANCORANDOM, GENTTROUGH, GENTPEAK, GENTRANDOM, TOBRATROUGH, TOBRAPEAK, TOBRARND, AMIKACINPEAK, AMIKACINTROU, AMIKACIN in the last 72 hours.   Assessment: 74 yo male admitted after falling and passing out. Now growing listeria in 1/2 BC. Pharmacy consulted to dose ampicillin. Vanc/Zosyn d/c'd. WBC wnl, Tmax/24h 101. SCr bump 1.07>>1.33, CrCl~53.  Vanc 12/4>>12/6 Zosyn 12/4>>12/6 12/6 ampicillin >>  12/3 Blood x 2 >> 1/2 listeria (contaminant?) 12/3 Urine >> Neg 12/4 Blood x 2 >> ngtd  Goal of Therapy:  Vancomycin trough level 15-20 mcg/ml  Plan:  D/c Vanc/Zosyn Start ampicillin 2g IV q6h Monitor clinical progress, repeat c/s, renal function, abx plan/LOT  Elicia Lamp, PharmD, [facility] Clinical Pharmacist Pager [phone] 08/30/2015 9:43 AM

## 2015-08-30 NOTE — H&P (View-Only) (Signed)
PATIENT DETAILS Name: Brett Castaneda Age: 74 y.o. Sex: male Date of Birth: 30-Jan-1941 Admit Date: 08/26/2015 Admitting Physician Theressa Millard, MD [id number] [name], MD  Subjective: No dizziness noted on sitting up, back pain much better.   Assessment/Plan: Principal Problem: Recurrent Syncope: EKG/telemetry negative. Orthostatics on admission negative.Loop recorder interrogation negative for arrhythmias.Recent transthoracic echocardiogram on 11/26 showed preserved ejection fraction.I suspect that this is most likely related to fever/bacteremia/neuropathy issues at this time. Continue telemetry monitoring. Follow  Active Problems: Bacteremia: Febrile this admission-One set of blood culture positive for gram variable rod. UA negative for UTI, chest x-ray negative for pneumonia.Given recent CVA-that was thought to be embolic-is this septic emboli. Repeat cultures 12/4 pending-ID consulted-on empiric Vanco/Zosyn-follow. TEE tomorrow am.   Hyponatremia:euvolemic on exam-stop IVF, fluid restrict and follow. Check UNa, Urine/Serum Osm.  Recent CVA: Nonfocal exam. Continue aspirin, statin. 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 TEE tomorrow am.   Back Pain: much better today-suspect this was secondary to fall/syncope-(hit his back) that occurred on admission. Much better with supportive measures-since improving-suspect we can hold of scanning spine.   Scalp laceration: Secondary to fall-remove sutures in 1 week  Hypertension: Controlled-continue amlodipine  Dyslipidemia: Continue statin  History of unspecified peripheral neuropathy:? Related to methotrexate  ? ALS: Reviewed prior note from Dr. Leonie Man on 11/29-he doubts ALS.  History of rheumatoid arthritis: Hold methotrexate-given fever  Deconditioning/frequent falls: PT evaluation- SNF placement.Consult social  workl  Disposition: Remain inpatient-await further work up-SNF on discharge  Antimicrobial agents  See below  Anti-infectives    Start     Dose/Rate Route Frequency Ordered Stop   08/29/15 0200  vancomycin (VANCOCIN) IVPB 1000 mg/200 mL premix     1,000 mg 200 mL/hr over 60 Minutes Intravenous Every 12 hours 08/28/15 1337     08/28/15 1400  piperacillin-tazobactam (ZOSYN) IVPB 3.375 g     3.375 g 12.5 mL/hr over 240 Minutes Intravenous Every 8 hours 08/28/15 1337     08/28/15 1400  vancomycin (VANCOCIN) 1,750 mg in sodium chloride 0.9 % 500 mL IVPB     1,750 mg 250 mL/hr over 120 Minutes Intravenous  Once 08/28/15 1337 08/28/15 1558      DVT Prophylaxis: Prophylactic Lovenox  Code Status:  DNR  Family Communication None  at bedside  Procedures: None  CONSULTS:  None  Time spent 30 minutes-Greater than 50% of this time was spent in counseling, explanation of diagnosis, planning of further management, and coordination of care.  MEDICATIONS: Scheduled Meds: . amLODipine  5 mg Oral Daily  . aspirin EC  325 mg Oral Daily  . enoxaparin (LOVENOX) injection  40 mg Subcutaneous Q24H  . folic acid-pyridoxine-cyancobalamin  1 tablet Oral Q Sat  . latanoprost  1 drop Left Eye QHS  . multivitamin-lutein  1 capsule Oral Daily  . pantoprazole  40 mg Oral Q1200  . piperacillin-tazobactam (ZOSYN)  IV  3.375 g Intravenous Q8H  . simvastatin  40 mg Oral q1800  . sodium chloride  3 mL Intravenous Q12H  . timolol  1 drop Left Eye QPM  . vancomycin  1,000 mg Intravenous Q12H   Continuous Infusions: . sodium chloride 10 mL/hr at 08/28/15 1005   PRN Meds:.acetaminophen **OR** acetaminophen, alum & mag hydroxide-simeth, hydrALAZINE, HYDROmorphone (DILAUDID) injection, ibuprofen, meclizine, ondansetron **OR** ondansetron (ZOFRAN) IV, oxyCODONE, polyethylene glycol    PHYSICAL EXAM: Vital signs in  last 24 hours: Filed Vitals:   08/29/15 0248 08/29/15 0500 08/29/15 0939 08/29/15  1227  BP: 146/88 163/93 172/96   Pulse: 97 81    Temp:  99.7 F (37.6 C)  101 F (38.3 C)  TempSrc:    Oral  Resp: 20     Height:      Weight:  73.755 kg (162 lb 9.6 oz)    SpO2: 94% 98%      Weight change: -2.449 kg (-5 lb 6.4 oz) Filed Weights   08/27/15 0046 08/28/15 0505 08/29/15 0500  Weight: 76.749 kg (169 lb 3.2 oz) 76.204 kg (168 lb) 73.755 kg (162 lb 9.6 oz)   Body mass index is 22.69 kg/(m^2).   Gen Exam: Awake and alert with clear speech.  Neck: Supple, No JVD.   Chest: B/L Clear.   CVS: S1 S2 Regular, no murmurs.  Abdomen: soft, BS +, non tender, non distended.  Extremities: no edema, lower extremities warm to touch. Neurologic: Non Focal-5/5 in all 4 extremities  Skin: No Rash.   Wounds: N/A.   Intake/Output from previous day:  Intake/Output Summary (Last 24 hours) at 08/29/15 1258 Last data filed at 08/29/15 1227  Gross per 24 hour  Intake 1581.01 ml  Output   3200 ml  Net -1618.99 ml     LAB RESULTS: CBC  Recent Labs Lab 08/24/15 0427 08/26/15 1502 08/27/15 0634 08/28/15 0816 08/29/15 0447  WBC 6.7 4.5 5.4 6.6 6.8  HGB 11.0* 12.5* 12.3* 11.9* 12.3*  HCT 32.8* 36.5* 36.9* 35.5* 35.6*  PLT 212 256 234 219 237  MCV 92.4 92.4 92.7 91.5 91.3  MCH 31.0 31.6 30.9 30.7 31.5  MCHC 33.5 34.2 33.3 33.5 34.6  RDW 13.6 13.8 13.7 13.8 14.1  LYMPHSABS 1.4 1.1  --   --   --   MONOABS 0.3 0.3  --   --   --   EOSABS 0.1 0.1  --   --   --   BASOSABS 0.0 0.0  --   --   --     Chemistries   Recent Labs Lab 08/24/15 0427 08/24/15 1017 08/26/15 1502 08/27/15 0634 08/28/15 0816 08/29/15 0447  NA 135  --  132* 134* 130* 129*  K 4.2  --  3.6 3.9 3.5 3.8  CL 107  --  100* 103 101 98*  CO2 22  --  24 25 22 24   GLUCOSE 109*  --  124* 105* 124* 135*  BUN 12  --  11 8 9 9   CREATININE 1.13  --  1.17 1.12 1.07 1.24  CALCIUM 7.9*  --  8.4* 8.3* 8.1* 8.0*  MG  --  1.8  --   --   --   --     CBG: No results for input(s): GLUCAP in the last 168  hours.  GFR Estimated Creatinine Clearance: 56.2 mL/min (by C-G formula based on Cr of 1.24).  Coagulation profile  Recent Labs Lab 08/23/15 2202  INR 1.11    Cardiac Enzymes  Recent Labs Lab 08/27/15 0135 08/27/15 0634 08/27/15 1255  TROPONINI 0.03 0.03 0.03    Invalid input(s): POCBNP No results for input(s): DDIMER in the last 72 hours. No results for input(s): HGBA1C in the last 72 hours. No results for input(s): CHOL, HDL, LDLCALC, TRIG, CHOLHDL, LDLDIRECT in the last 72 hours. No results for input(s): TSH, T4TOTAL, T3FREE, THYROIDAB in the last 72 hours.  Invalid input(s): FREET3 No results for input(s): VITAMINB12, FOLATE, FERRITIN, TIBC, IRON,  RETICCTPCT in the last 72 hours. No results for input(s): LIPASE, AMYLASE in the last 72 hours.  Urine Studies No results for input(s): UHGB, CRYS in the last 72 hours.  Invalid input(s): UACOL, UAPR, USPG, UPH, UTP, UGL, UKET, UBIL, UNIT, UROB, ULEU, UEPI, UWBC, URBC, UBAC, CAST, UCOM, BILUA  MICROBIOLOGY: Recent Results (from the past 240 hour(s))  Blood culture (routine x 2)     Status: None   Collection Time: 08/19/15  9:45 PM  Result Value Ref Range Status   Specimen Description BLOOD RIGHT FOREARM  Final   Special Requests IN PEDIATRIC BOTTLE [place]  Final   Culture NO GROWTH 5 DAYS  Final   Report Status 08/24/2015 FINAL  Final  Blood culture (routine x 2)     Status: None   Collection Time: 08/19/15  9:51 PM  Result Value Ref Range Status   Specimen Description BLOOD RIGHT HAND  Final   Special Requests BOTTLES DRAWN AEROBIC AND ANAEROBIC 5CC  Final   Culture NO GROWTH 5 DAYS  Final   Report Status 08/24/2015 FINAL  Final  Urine culture     Status: None   Collection Time: 08/27/15  9:25 AM  Result Value Ref Range Status   Specimen Description URINE, RANDOM  Final   Special Requests UR BAG PED  Final   Culture MULTIPLE SPECIES PRESENT, SUGGEST RECOLLECTION  Final   Report Status 08/28/2015 FINAL  Final   Culture, blood (routine x 2)     Status: None (Preliminary result)   Collection Time: 08/27/15  9:40 AM  Result Value Ref Range Status   Specimen Description BLOOD LEFT ANTECUBITAL  Final   Special Requests BOTTLES DRAWN AEROBIC AND ANAEROBIC 10CC  Final   Culture NO GROWTH 1 DAY  Final   Report Status PENDING  Incomplete  Culture, blood (routine x 2)     Status: None (Preliminary result)   Collection Time: 08/27/15  9:43 AM  Result Value Ref Range Status   Specimen Description BLOOD RIGHT WRIST  Final   Special Requests BOTTLES DRAWN AEROBIC AND ANAEROBIC 10CC  Final   Culture  Setup Time   Final    GRAM VARIABLE ROD AEROBIC BOTTLE ONLY CRITICAL RESULT CALLED TO, READ BACK BY AND VERIFIED WITH: J. ODDONO,RN AT [id number] ON [id number] BY Rhea Bleacher    Culture CULTURE REINCUBATED FOR BETTER GROWTH  Final   Report Status PENDING  Incomplete    RADIOLOGY STUDIES/RESULTS: Dg Chest 2 View  08/19/2015  CLINICAL DATA:  Stroke symptoms tonight. Fever. Hypertension. Ex-smoker. EXAM: CHEST  2 VIEW COMPARISON:  10/28/2013 FINDINGS: Mild cardiac enlargement without vascular congestion. Interstitial changes in the lung bases similar to previous study, likely fibrosis. No focal airspace disease. No blunting of costophrenic angles. No pneumothorax. Calcified and tortuous aorta. Surgical clips in the right upper quadrant. 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 Mild cardiac enlargement. No evidence of active pulmonary disease. Electronically Signed   By: Lucienne Capers M.D.   On: 08/19/2015 23:02   Ct Head Wo Contrast  08/24/2015  CLINICAL DATA:  74 year old male with syncope. EXAM: CT HEAD WITHOUT CONTRAST TECHNIQUE: Contiguous axial images were obtained from the base of the skull through the vertex without intravenous contrast. COMPARISON:  Brain CT and MRI dated 08/19/2015 FINDINGS: The ventricles are dilated and the sulci are prominent compatible with age-related atrophy. Periventricular and deep white  matter hypodensities represent chronic microvascular ischemic changes. Stable right frontal lobe old infarct and encephalomalacia. There is no intracranial hemorrhage.  No mass effect or midline shift identified. The visualized paranasal sinuses and mastoid air cells are well aerated. The calvarium is intact. There is a stable calcific changes of the right globe. IMPRESSION: No acute intracranial pathology. Age-related atrophy and chronic microvascular ischemic disease. Stable right frontal lobe old infarct and encephalomalacia. If symptoms persist and there are no contraindications, MRI may provide better evaluation if clinically indicated. Electronically Signed   By: Anner Crete M.D.   On: 08/24/2015 00:29   Ct Head Wo Contrast  08/19/2015  CLINICAL DATA:  Altered mental status.  No weakness.  Code stroke. EXAM: CT HEAD WITHOUT CONTRAST TECHNIQUE: Contiguous axial images were obtained from the base of the skull through the vertex without intravenous contrast. COMPARISON:  07/16/2013 FINDINGS: Diffuse cerebral atrophy. No ventricular dilatation. Low-attenuation changes in the deep white matter consistent with small vessel ischemia. Focal encephalomalacia in the right frontal lobe consistent with old infarct. No mass effect or midline shift. No abnormal extra-axial fluid collections. Gray-white matter junctions are distinct. Basal cisterns are not effaced. No evidence of acute intracranial hemorrhage. No depressed skull fractures. Mild mucosal thickening in the paranasal sinuses. Mastoid air cells are not opacified. Vascular calcifications. Calcification and deformity of the right globe consistent with old insult. Focal increased density in the left globe likely postoperative. IMPRESSION: No acute intracranial abnormalities. Chronic atrophy and small vessel ischemic changes. Old right frontal infarct. These results were called by telephone at the time of interpretation on 08/19/2015 at 8:38 pm to Dr.  Leonel Ramsay, who verbally acknowledged these results. Electronically Signed   By: Lucienne Capers M.D.   On: 08/19/2015 20:41   [name]  08/19/2015  CLINICAL DATA:  Initial valuation for acute altered mental status. EXAM: MRI HEAD WITHOUT CONTRAST TECHNIQUE: Multiplanar, multiecho pulse sequences of the brain and surrounding structures were obtained without intravenous contrast. COMPARISON:  Prior head CT from earlier the same day P FINDINGS: Diffusion-weighted sequences demonstrate a possible tiny 3 mm cortical infarct within the left temporal lobe (series 3, image 20). No associated edema or hemorrhage. No other infarct. Largely absent flow void within the diminutive right vertebral artery, stable. Major intracranial vascular flow voids are otherwise maintained. Diffuse prominence of the CSF containing spaces is compatible with generalized age-related cerebral atrophy. Mild chronic small vessel ischemic disease in within the periventricular white matter. Encephalomalacia within the high right frontal lobe consistent with remote ischemic infarct. There is associated gliosis within this region. Multiple additional remote lacunar infarcts within the right basal ganglia involving both the caudate and lentiform nuclei. No mass lesion, midline shift, or mass effect. No hydrocephalus. No extra-axial fluid collection. Pituitary gland normal. No acute abnormality about the globes. 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 Mild mucosal thickening within the ethmoidal air cells and maxillary sinuses. No mastoid effusion. Inner ear structures normal. Bone marrow signal intensity within normal limits. No scalp soft tissue abnormality. IMPRESSION: 1. 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 2. No other acute intracranial process identified. 3. Moderate to large remote infarct within the posterior right frontal  lobe with extension into the operculum. Additional remote lacunar infarcts involving the right basal ganglia. 4. Nonvisualization of the right vertebral artery, which may be occluded. This is stable relative to previous MRI from 2014. 5. Atrophy with mild chronic small vessel ischemic disease. Electronically Signed   By: Jeannine Boga M.D.   On: 08/19/2015 23:14  Dg Chest Port 1 View  08/27/2015  CLINICAL DATA:  Fever with back pain. History of ALS, CVA and rheumatoid arthritis. EXAM: PORTABLE CHEST 1 VIEW COMPARISON:  CT and radiographs 08/23/2015. FINDINGS: 0831 hours. There are lower lung volumes. Patient has chronic fibrotic changes at both lung bases with probable mild superimposed atelectasis related to the lower lung volumes. There is no consolidation or significant pleural effusion. The heart size and mediastinal contours are stable. IMPRESSION: Stable fibrotic changes at both lung bases with mild superimposed atelectasis. No evidence of pneumonia. Electronically Signed   By: Richardean Sale M.D.   On: 08/27/2015 10:45   Dg Chest Portable 1 View  08/23/2015  CLINICAL DATA:  Initial evaluation for 2 episodes of syncope today, nausea, weakness. EXAM: PORTABLE CHEST 1 VIEW COMPARISON:  Prior radiograph from 08/19/2015. FINDINGS: Cardiomegaly is stable from prior. Mediastinal silhouette within normal limits. Tortuosity intrathoracic aorta with atheromatous plaque within the aortic arch noted, stable. Lungs are hypoinflated with bibasilar fibrotic changes, left greater than right Ing, stable. No focal infiltrates. No pulmonary edema or pleural effusion. No pneumothorax. No acute osseus abnormality. IMPRESSION: Stable appearance of the chest with left greater than right bibasilar fibrosis and mild cardiomegaly. No active cardiopulmonary disease. Electronically Signed   By: Jeannine Boga M.D.   On: 08/23/2015 22:39   Ct Angio Chest Aorta W/cm &/or Wo/cm  08/24/2015  CLINICAL DATA:  [place]  today after becoming lightheaded. Was discharged from the hospital earlier today. EXAM: CT ANGIOGRAPHY CHEST, ABDOMEN AND PELVIS TECHNIQUE: Multidetector CT imaging through the chest, abdomen and pelvis was performed using the standard protocol during bolus administration of intravenous contrast. Multiplanar reconstructed images and MIPs were obtained and reviewed to evaluate the vascular anatomy. CONTRAST:  129mL OMNIPAQUE IOHEXOL 350 MG/ML SOLN COMPARISON:  07/23/2013 FINDINGS: CTA CHEST FINDINGS The thoracic aorta is normal in caliber. There is extensive atherosclerotic plaque. There is no dissection. Proximal great vessels are patent. There is good opacification of the pulmonary arteries with no evidence of pulmonary embolism. Review of the MIP images confirms the above findings. Nonvascular chest findings include multiple small thyroid nodules, multiple nonspecific nodes in the mediastinum and both hila, and fibrotic honeycombing in the basilar lung periphery. Central airways are patent. No pleural effusions or pericardial effusions. CTA ABDOMEN AND PELVIS FINDINGS The upper abdominal aorta is normal in caliber with extensive atherosclerotic plaque. There is mild fusiform dilatation of the infrarenal abdominal aorta to a maximum AP diameter 3.4 cm. There is mild stenosis of the celiac axis 1 cm from its origin. Superior mesenteric artery and inferior mesenteric artery are widely patent. Each kidney is served by a widely patent single main renal artery. The common iliac and external iliac arteries are patent bilaterally. No dissection of the abdominal aorta. Review of the MIP images confirms the above findings. Nonvascular findings include extensive colonic diverticulosis without evidence of diverticulitis or other acute inflammatory process. There are unremarkable arterial phase appearances of the liver, spleen, pancreas, adrenals and kidneys. There is prior cholecystectomy. Bile ducts are unremarkable.  Moderate prostatic enlargement. No significant skeletal lesions. IMPRESSION: 1. Extensive atherosclerotic plaque throughout the thoracic and abdominal aorta. There is no dissection. There is a 3.4 cm infrarenal abdominal aortic aneurysm. 2. Negative for acute pulmonary embolism. 3. Multiple tiny thyroid nodules. 4. Diverticulosis. Electronically Signed   By: Andreas Newport M.D.   On: 08/24/2015 00:34   Ct Angio Abd/pel W/ And/or W/o  08/24/2015  CLINICAL DATA:  [place] today after becoming lightheaded.  Was discharged from the hospital earlier today. EXAM: CT ANGIOGRAPHY CHEST, ABDOMEN AND PELVIS TECHNIQUE: Multidetector CT imaging through the chest, abdomen and pelvis was performed using the standard protocol during bolus administration of intravenous contrast. Multiplanar reconstructed images and MIPs were obtained and reviewed to evaluate the vascular anatomy. CONTRAST:  165mL OMNIPAQUE IOHEXOL 350 MG/ML SOLN COMPARISON:  07/23/2013 FINDINGS: CTA CHEST FINDINGS The thoracic aorta is normal in caliber. There is extensive atherosclerotic plaque. There is no dissection. Proximal great vessels are patent. There is good opacification of the pulmonary arteries with no evidence of pulmonary embolism. Review of the MIP images confirms the above findings. Nonvascular chest findings include multiple small thyroid nodules, multiple nonspecific nodes in the mediastinum and both hila, and fibrotic honeycombing in the basilar lung periphery. Central airways are patent. No pleural effusions or pericardial effusions. CTA ABDOMEN AND PELVIS FINDINGS The upper abdominal aorta is normal in caliber with extensive atherosclerotic plaque. There is mild fusiform dilatation of the infrarenal abdominal aorta to a maximum AP diameter 3.4 cm. There is mild stenosis of the celiac axis 1 cm from its origin. Superior mesenteric artery and inferior mesenteric artery are widely patent. Each kidney is served by a widely patent single main  renal artery. The common iliac and external iliac arteries are patent bilaterally. No dissection of the abdominal aorta. Review of the MIP images confirms the above findings. Nonvascular findings include extensive colonic diverticulosis without evidence of diverticulitis or other acute inflammatory process. There are unremarkable arterial phase appearances of the liver, spleen, pancreas, adrenals and kidneys. There is prior cholecystectomy. Bile ducts are unremarkable. Moderate prostatic enlargement. No significant skeletal lesions. IMPRESSION: 1. Extensive atherosclerotic plaque throughout the thoracic and abdominal aorta. There is no dissection. There is a 3.4 cm infrarenal abdominal aortic aneurysm. 2. Negative for acute pulmonary embolism. 3. Multiple tiny thyroid nodules. 4. Diverticulosis. Electronically Signed   By: Andreas Newport M.D.   On: 08/24/2015 00:34    Oren Binet, MD  [facility] Pager:[phone]  If 7PM-7AM, please contact night-coverage [email or web address] Password [email or web address] 08/29/2015, 12:58 PM   LOS: 1 day

## 2015-08-30 NOTE — Plan of Care (Signed)
Problem: Activity: Goal: Risk for activity intolerance will decrease Outcome: Progressing Patient has been able to get up into the chair. Patient continues to have generalized weakness and activity intolerance. Patient has received recommendations from physical therapy.  Problem: Bowel/Gastric: Goal: Will not experience complications related to bowel motility Outcome: Not Progressing Patient c/o constipation. States that he has received miralax twice yesterday and is currently still unable to have a bowel movement.

## 2015-08-30 NOTE — Progress Notes (Addendum)
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 Dopamine Anaphylaxis  . Morphine And Related Other (See Comments)    Hallucinations, very bad reactions  . Vicodin [Hydrocodone-Acetaminophen] Nausea And Vomiting    Patient Measurements: Height: 5\' 11"  (180.3 cm) Weight: 167 lb 3.2 oz (75.841 kg) IBW/kg (Calculated) : 75.3  Vital Signs: Temp: 97.3 F (36.3 C) (12/06 1110) Temp Source: Oral (12/06 1110) BP: 131/77 mmHg (12/06 1110) Pulse Rate: 76 (12/06 1020)  Labs:  Recent Labs  08/27/15 1255 08/28/15 0816 08/29/15 0447 08/30/15 0405  HGB  --  11.9* 12.3*  --   HCT  --  35.5* 35.6*  --   PLT  --  219 237  --   CREATININE  --  1.07 1.24 1.33*  TROPONINI 0.03  --   --   --     Estimated Creatinine Clearance: 53.5 mL/min (by C-G formula based on Cr of 1.33).   Medical History: Past Medical History  Diagnosis Date  . BASAL CELL CARCINOMA, FACE 03/31/2009  . 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 CONSTIPATION 06/22/2008  . DIVERTICULOSIS, COLON 03/24/2007  . GERD 03/24/2007  . HYPERLIPIDEMIA 08/27/2007  . HYPERTENSION 12/14/2008  . Rheumatoid arthritis(714.0) 03/24/2007  . Blind right eye   . Retinal detachment     hx of  . Keratosis Oct. 2013  . Fall at home Nov. 5, 2014  06-28-13    in the home and Outside as well  . 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 Stroke [facility]) Oct 2014  . 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 Stroke MD Erlinda Hong) wants coumadin with aspirin bridge till INR therapeutic. No anticoag pta. Baseline INR 1.24 today. D/c  lovenox PPX ordered. Warfarin predictor pts = 5. No high-risk characteristics for lower initial dose. LFTs were mildly elevated several days ago, no recheck yet.  Goal of Therapy:  INR 2-3 Monitor platelets by anticoagulation protocol: Yes   Plan:  Warfarin 7.5mg  x 1 dose tonight Asa 325mg  QD per Dr. Erlinda Hong - bridge until INR therapeutic D/c lovenox PPX Daily INR Mon s/sx bleeding  Elicia Lamp, PharmD, [facility] Clinical Pharmacist Pager [phone] 08/30/2015 11:57 AM

## 2015-08-31 ENCOUNTER — Encounter (HOSPITAL_COMMUNITY): Payer: Self-pay | Admitting: Cardiology

## 2015-08-31 DIAGNOSIS — I10 Essential (primary) hypertension: Secondary | ICD-10-CM | POA: Diagnosis not present

## 2015-08-31 DIAGNOSIS — D508 Other iron deficiency anemias: Secondary | ICD-10-CM | POA: Diagnosis not present

## 2015-08-31 DIAGNOSIS — M6281 Muscle weakness (generalized): Secondary | ICD-10-CM | POA: Diagnosis not present

## 2015-08-31 DIAGNOSIS — R278 Other lack of coordination: Secondary | ICD-10-CM | POA: Diagnosis not present

## 2015-08-31 DIAGNOSIS — I6529 Occlusion and stenosis of unspecified carotid artery: Secondary | ICD-10-CM | POA: Diagnosis not present

## 2015-08-31 DIAGNOSIS — E871 Hypo-osmolality and hyponatremia: Secondary | ICD-10-CM | POA: Diagnosis not present

## 2015-08-31 DIAGNOSIS — D6489 Other specified anemias: Secondary | ICD-10-CM | POA: Diagnosis not present

## 2015-08-31 DIAGNOSIS — Z9181 History of falling: Secondary | ICD-10-CM | POA: Diagnosis not present

## 2015-08-31 DIAGNOSIS — R5381 Other malaise: Secondary | ICD-10-CM | POA: Diagnosis not present

## 2015-08-31 DIAGNOSIS — M069 Rheumatoid arthritis, unspecified: Secondary | ICD-10-CM | POA: Diagnosis not present

## 2015-08-31 DIAGNOSIS — E785 Hyperlipidemia, unspecified: Secondary | ICD-10-CM | POA: Diagnosis not present

## 2015-08-31 DIAGNOSIS — R41841 Cognitive communication deficit: Secondary | ICD-10-CM | POA: Diagnosis not present

## 2015-08-31 DIAGNOSIS — R296 Repeated falls: Secondary | ICD-10-CM | POA: Diagnosis not present

## 2015-08-31 DIAGNOSIS — Z8673 Personal history of transient ischemic attack (TIA), and cerebral infarction without residual deficits: Secondary | ICD-10-CM | POA: Diagnosis not present

## 2015-08-31 DIAGNOSIS — R55 Syncope and collapse: Secondary | ICD-10-CM | POA: Diagnosis not present

## 2015-08-31 DIAGNOSIS — R7881 Bacteremia: Secondary | ICD-10-CM | POA: Diagnosis not present

## 2015-08-31 DIAGNOSIS — R2681 Unsteadiness on feet: Secondary | ICD-10-CM | POA: Diagnosis not present

## 2015-08-31 DIAGNOSIS — I639 Cerebral infarction, unspecified: Secondary | ICD-10-CM | POA: Diagnosis not present

## 2015-08-31 LAB — BASIC METABOLIC PANEL
ANION GAP: 8 (ref 5–15)
BUN: 10 mg/dL (ref 6–20)
CHLORIDE: 101 mmol/L (ref 101–111)
CO2: 24 mmol/L (ref 22–32)
Calcium: 8 mg/dL — ABNORMAL LOW (ref 8.9–10.3)
Creatinine, Ser: 1.29 mg/dL — ABNORMAL HIGH (ref 0.61–1.24)
GFR calc non Af Amer: 54 mL/min — ABNORMAL LOW (ref >60)
GLUCOSE: 80 mg/dL (ref 65–99)
POTASSIUM: 3.7 mmol/L (ref 3.5–5.1)
SODIUM: 133 mmol/L — AB (ref 135–145)

## 2015-08-31 LAB — PROTIME-INR
INR: 1.22 (ref 0.00–1.49)
Prothrombin Time: 15.5 seconds — ABNORMAL HIGH (ref 11.6–15.2)

## 2015-08-31 MED ORDER — OXYCODONE HCL 5 MG PO TABS: 5.0000 mg | ORAL_TABLET | Freq: Four times a day (QID) | ORAL | Status: DC | PRN

## 2015-08-31 MED ORDER — WARFARIN SODIUM 7.5 MG PO TABS: 7.5000 mg | ORAL_TABLET | Freq: Every day | ORAL | Status: DC

## 2015-08-31 MED ORDER — ASPIRIN EC 325 MG PO TBEC: 325.0000 mg | DELAYED_RELEASE_TABLET | Freq: Every day | ORAL | Status: DC

## 2015-08-31 MED ORDER — SODIUM CHLORIDE 0.9 % IJ SOLN: 10.0000 mL | INTRAMUSCULAR | Status: DC | PRN

## 2015-08-31 MED ORDER — WARFARIN SODIUM 7.5 MG PO TABS: 7.5000 mg | ORAL_TABLET | Freq: Once | ORAL | Status: DC

## 2015-08-31 MED ORDER — HEPARIN SOD (PORK) LOCK FLUSH 100 UNIT/ML IV SOLN
250.0000 [IU] | INTRAVENOUS | Status: AC | PRN
  Administered 2015-08-31: 250 [IU]
  Filled 2015-08-31: qty 5

## 2015-08-31 MED ORDER — METHOTREXATE 2.5 MG PO TABS: 10.0000 mg | ORAL_TABLET | ORAL | Status: AC

## 2015-08-31 MED ORDER — PANTOPRAZOLE SODIUM 40 MG PO TBEC: 40.0000 mg | DELAYED_RELEASE_TABLET | Freq: Every day | ORAL | Status: DC

## 2015-08-31 MED ORDER — SODIUM CHLORIDE 0.9 % IV SOLN: 2.0000 g | Freq: Four times a day (QID) | INTRAVENOUS | Status: DC

## 2015-08-31 NOTE — Care Management Note (Addendum)
Case Management Note  Patient Details  Name: Brett Castaneda MRN: [id number] Date of Birth: [birth year]  Subjective/Objective:   Pt admitted for synocpe. Plan for d/c to [facility].                  Action/Plan: CM to sign off and CSW to assist with disposition needs.   Expected Discharge Date:                  Expected Discharge Plan:  Skilled Nursing Facility  In-House Referral:  Clinical Social Work  Discharge planning Services  CM Consult  Post Acute Care Choice:  NA Choice offered to:  NA  DME Arranged:  N/A DME Agency:  NA  HH Arranged:  NA HH Agency:  NA  Status of Service:  Completed, signed off  Medicare Important Message Given:    Date Medicare IM Given:    Medicare IM give by:    Date Additional Medicare IM Given:    Additional Medicare Important Message give by:     If discussed at [place] of Stay Meetings, dates discussed:    Additional Comments:  Bethena Roys, RN 08/31/2015, 10:54 AM

## 2015-08-31 NOTE — Clinical Social Work Placement (Signed)
   CLINICAL SOCIAL WORK PLACEMENT  NOTE  Date:  08/31/2015  Patient Details  Name: Brett Castaneda MRN: [id number] Date of Birth: Jul 20, 1941  Clinical Social Work is seeking post-discharge placement for this patient at the [place] level of care (*CSW will initial, date and re-position this form in  chart as items are completed):  Yes   Patient/family provided with [place] Work Department's list of facilities offering this level of care within the geographic area requested by the patient (or if unable, by the patient's family).  Yes   Patient/family informed of their freedom to choose among providers that offer the needed level of care, that participate in Medicare, Medicaid or managed care program needed by the patient, have an available bed and are willing to accept the patient.  Yes   Patient/family informed of [facility]'s ownership interest in [facility] and [facility], as well as of the fact that they are under no obligation to receive care at these facilities.  PASRR submitted to EDS on 08/28/15     PASRR number received on 08/28/15     Existing PASRR number confirmed on       FL2 transmitted to all facilities in geographic area requested by pt/family on 08/28/15     FL2 transmitted to all facilities within larger geographic area on       Patient informed that his/her managed care company has contracts with or will negotiate with certain facilities, including the following:        Yes   Patient/family informed of bed offers received.  Patient chooses bed at [facility]     Physician recommends and patient chooses bed at      Patient to be transferred to [facility] on 08/31/15.  Patient to be transferred to facility by Ambulance     Patient family notified on 08/31/15 of transfer.  Name of family member notified:  Niece at bedside     PHYSICIAN Please sign FL2     Additional Comment:    Barbette Or,  [place]

## 2015-08-31 NOTE — Care Management Important Message (Signed)
Important Message  Patient Details  Name: Brett Castaneda MRN: [id number] Date of Birth: [birth year]   Medicare Important Message Given:  Yes    Nathen May 08/31/2015, 11:35 AM

## 2015-08-31 NOTE — Discharge Summary (Addendum)
PATIENT DETAILS Name: Brett Castaneda Age: 74 y.o. Sex: male Date of Birth: 1941/03/26 MRN: [id number]. Admitting Physician: Theressa Millard, MD [id number] [name], MD  Admit Date: 08/26/2015 Discharge date: 08/31/2015  Recommendations for Outpatient Follow-up:  1. 2 weeks of IV ampicillin from 08/30/15 2. CBC/chemistries weekly while on antibiotics 3. PICC line care per protocol 4. Discontinue PICC line when patient completes IV antibiotics 5. Hold methotrexate until bacteremia has resolved. 6. Repeat blood cultures once completes IV antibiotics to document clearance of infection 7. Continue aspirin till INR is therapeutic-following which discontinue aspirin and just maintain on Coumadin 8. Repeat TEE in 3 months-and reassess if Coumadin needs to be continued. 9. Please ensure follow-up with neurology-Dr. Leonie Man 10. Check INR every 1-2 days till therapeutic   PRIMARY DISCHARGE DIAGNOSIS:  Principal Problem:   Syncope Active Problems:   Dyslipidemia   Essential hypertension   Rheumatoid arthritis ([place])   ALS (amyotrophic lateral sclerosis) (HCC)   Cerebral infarction due to unspecified mechanism   Fall   Scalp laceration   Weakness   Bacteremia      PAST MEDICAL HISTORY: Past Medical History  Diagnosis Date  . BASAL CELL CARCINOMA, FACE 03/31/2009  . 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 CONSTIPATION 06/22/2008  . DIVERTICULOSIS, COLON 03/24/2007  . GERD 03/24/2007  . HYPERLIPIDEMIA 08/27/2007  . HYPERTENSION 12/14/2008  . Rheumatoid arthritis(714.0) 03/24/2007  . Blind right eye   . Retinal detachment     hx of  . Keratosis Oct. 2013  . Fall at home Nov. 5, 2014  06-28-13    in the home and Outside as well  . 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 Stroke [facility]) Oct 2014  . ALS (amyotrophic lateral sclerosis) ([place]) dx jan. 14, 2015    symptons consistent, 2nd opinion at [facility] at 11/09/13    DISCHARGE  MEDICATIONS: Current Discharge Medication List    START taking these medications   Details  ampicillin 2 g in sodium chloride 0.9 % 50 mL Inject 2 g into the vein every 6 (six) hours. 2 Weeks from 08/30/15    meclizine (ANTIVERT) 50 MG tablet Take 1 tablet (50 mg total) by mouth 3 (three) times daily as needed. Qty: 30 tablet, Refills: 0    oxyCODONE (OXY IR/ROXICODONE) 5 MG immediate release tablet Take 1 tablet (5 mg total) by mouth every 6 (six) hours as needed for moderate pain. Qty: 15 tablet, Refills: 0    pantoprazole (PROTONIX) 40 MG tablet Take 1 tablet (40 mg total) by mouth daily at 12 noon.    warfarin (COUMADIN) 7.5 MG tablet Take 1 tablet (7.5 mg total) by mouth daily at 6 PM.      CONTINUE these medications which have CHANGED   Details  aspirin EC 325 MG tablet Take 1 tablet (325 mg total) by mouth daily. Discontinue when INR therapeutic (more than 2)    methotrexate (RHEUMATREX) 2.5 MG tablet Take 4 tablets (10 mg total) by mouth once a week. Resume once patient has completed IV antibiotics.      CONTINUE these medications which have NOT CHANGED   Details  acetaminophen (TYLENOL) 500 MG tablet Take 500 mg by mouth every 6 (six) hours as needed for mild pain, moderate pain or fever.     alendronate (FOSAMAX) 70 MG tablet Take 70 mg by mouth once a week. Take with a full glass of water on an empty stomach.  Takes on Wednesday    amLODipine (NORVASC) 5 MG tablet Take 1 tablet (5 mg total) by mouth daily. Qty: 30 tablet, Refills: 0    folic acid-pyridoxine-cyancobalamin (VIRT-VITE FORTE) 2.5-25-2 MG TABS Take 1 tablet by mouth once a week. Takes on days he takes methotrexate. (Saturdays)    latanoprost (XALATAN) 0.005 % ophthalmic solution Place 1 drop into the left eye at bedtime.     Multiple Vitamins-Minerals (OCUVITE ADULT 50+) CAPS Take 1 capsule by mouth daily.    polyethylene glycol (MIRALAX / GLYCOLAX) packet Take 17 g by mouth daily as needed (for  constipation).    simvastatin (ZOCOR) 40 MG tablet Take 1 tablet (40 mg total) by mouth daily at 6 PM. Qty: 30 tablet, Refills: 0    sodium chloride (OCEAN) 0.65 % SOLN nasal spray Place 1 spray into both nostrils 3 (three) times daily.    tamsulosin (FLOMAX) 0.4 MG CAPS capsule Take 0.4 mg by mouth daily. Refills: 0    timolol (TIMOPTIC) 0.5 % ophthalmic solution Place 1 drop into the left eye every evening.       STOP taking these medications     Probiotic Product (PROBIOTIC DAILY PO)      aspirin 325 MG tablet         ALLERGIES:   Allergies  Allergen Reactions  . Dopamine Anaphylaxis  . Morphine And Related Other (See Comments)    Hallucinations, very bad reactions  . Vicodin [Hydrocodone-Acetaminophen] Nausea And Vomiting    BRIEF HPI:  See H&P, Labs, Consult and Test reports for all details in brief, patient was admitted for evaluation of syncopal episode.  CONSULTATIONS:   ID  PERTINENT RADIOLOGIC STUDIES: Dg Chest 2 View  08/19/2015  CLINICAL DATA:  Stroke symptoms tonight. Fever. Hypertension. Ex-smoker. EXAM: CHEST  2 VIEW COMPARISON:  10/28/2013 FINDINGS: Mild cardiac enlargement without vascular congestion. Interstitial changes in the lung bases similar to previous study, likely fibrosis. No focal airspace disease. No blunting of costophrenic angles. No pneumothorax. Calcified and tortuous aorta. Surgical clips in the right upper quadrant. 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 Mild cardiac enlargement. No evidence of active pulmonary disease. Electronically Signed   By: Lucienne Capers M.D.   On: 08/19/2015 23:02   Ct Head Wo Contrast  08/24/2015  CLINICAL DATA:  74 year old male with syncope. EXAM: CT HEAD WITHOUT CONTRAST TECHNIQUE: Contiguous axial images were obtained from the base of the skull through the vertex without intravenous contrast. COMPARISON:  Brain CT and MRI dated 08/19/2015 FINDINGS: The ventricles are dilated and the sulci are prominent  compatible with age-related atrophy. Periventricular and deep white matter hypodensities represent chronic microvascular ischemic changes. Stable right frontal lobe old infarct and encephalomalacia. There is no intracranial hemorrhage. No mass effect or midline shift identified. The visualized paranasal sinuses and mastoid air cells are well aerated. The calvarium is intact. There is a stable calcific changes of the right globe. IMPRESSION: No acute intracranial pathology. Age-related atrophy and chronic microvascular ischemic disease. Stable right frontal lobe old infarct and encephalomalacia. If symptoms persist and there are no contraindications, MRI may provide better evaluation if clinically indicated. Electronically Signed   By: Anner Crete M.D.   On: 08/24/2015 00:29   Ct Head Wo Contrast  08/19/2015  CLINICAL DATA:  Altered mental status.  No weakness.  Code stroke. EXAM: CT HEAD WITHOUT CONTRAST TECHNIQUE: Contiguous axial images were obtained from the base of the skull through the vertex without intravenous contrast. COMPARISON:  07/16/2013 FINDINGS: Diffuse  cerebral atrophy. No ventricular dilatation. Low-attenuation changes in the deep white matter consistent with small vessel ischemia. Focal encephalomalacia in the right frontal lobe consistent with old infarct. No mass effect or midline shift. No abnormal extra-axial fluid collections. Gray-white matter junctions are distinct. Basal cisterns are not effaced. No evidence of acute intracranial hemorrhage. No depressed skull fractures. Mild mucosal thickening in the paranasal sinuses. Mastoid air cells are not opacified. Vascular calcifications. Calcification and deformity of the right globe consistent with old insult. Focal increased density in the left globe likely postoperative. IMPRESSION: No acute intracranial abnormalities. Chronic atrophy and small vessel ischemic changes. Old right frontal infarct. These results were called by telephone  at the time of interpretation on 08/19/2015 at 8:38 pm to Dr. Leonel Ramsay, who verbally acknowledged these results. Electronically Signed   By: Lucienne Capers M.D.   On: 08/19/2015 20:41   [name]  08/19/2015  CLINICAL DATA:  Initial valuation for acute altered mental status. EXAM: MRI HEAD WITHOUT CONTRAST TECHNIQUE: Multiplanar, multiecho pulse sequences of the brain and surrounding structures were obtained without intravenous contrast. COMPARISON:  Prior head CT from earlier the same day P FINDINGS: Diffusion-weighted sequences demonstrate a possible tiny 3 mm cortical infarct within the left temporal lobe (series 3, image 20). No associated edema or hemorrhage. No other infarct. Largely absent flow void within the diminutive right vertebral artery, stable. Major intracranial vascular flow voids are otherwise maintained. Diffuse prominence of the CSF containing spaces is compatible with generalized age-related cerebral atrophy. Mild chronic small vessel ischemic disease in within the periventricular white matter. Encephalomalacia within the high right frontal lobe consistent with remote ischemic infarct. There is associated gliosis within this region. Multiple additional remote lacunar infarcts within the right basal ganglia involving both the caudate and lentiform nuclei. No mass lesion, midline shift, or mass effect. No hydrocephalus. No extra-axial fluid collection. Pituitary gland normal. No acute abnormality about the globes. 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 Mild mucosal thickening within the ethmoidal air cells and maxillary sinuses. No mastoid effusion. Inner ear structures normal. Bone marrow signal intensity within normal limits. No scalp soft tissue abnormality. IMPRESSION: 1. 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 2. No other acute intracranial process identified. 3.  Moderate to large remote infarct within the posterior right frontal lobe with extension into the operculum. Additional remote lacunar infarcts involving the right basal ganglia. 4. Nonvisualization of the right vertebral artery, which may be occluded. This is stable relative to previous MRI from 2014. 5. Atrophy with mild chronic small vessel ischemic disease. Electronically Signed   By: Jeannine Boga M.D.   On: 08/19/2015 23:14   Dg Chest Port 1 View  08/27/2015  CLINICAL DATA:  Fever with back pain. History of ALS, CVA and rheumatoid arthritis. EXAM: PORTABLE CHEST 1 VIEW COMPARISON:  CT and radiographs 08/23/2015. FINDINGS: 0831 hours. There are lower lung volumes. Patient has chronic fibrotic changes at both lung bases with probable mild superimposed atelectasis related to the lower lung volumes. There is no consolidation or significant pleural effusion. The heart size and mediastinal contours are stable. IMPRESSION: Stable fibrotic changes at both lung bases with mild superimposed atelectasis. No evidence of pneumonia. Electronically Signed   By: Richardean Sale M.D.   On: 08/27/2015 10:45   Dg Chest Portable 1 View  08/23/2015  CLINICAL DATA:  Initial evaluation for 2 episodes of syncope today, nausea, weakness.  EXAM: PORTABLE CHEST 1 VIEW COMPARISON:  Prior radiograph from 08/19/2015. FINDINGS: Cardiomegaly is stable from prior. Mediastinal silhouette within normal limits. Tortuosity intrathoracic aorta with atheromatous plaque within the aortic arch noted, stable. Lungs are hypoinflated with bibasilar fibrotic changes, left greater than right Ing, stable. No focal infiltrates. No pulmonary edema or pleural effusion. No pneumothorax. No acute osseus abnormality. IMPRESSION: Stable appearance of the chest with left greater than right bibasilar fibrosis and mild cardiomegaly. No active cardiopulmonary disease. Electronically Signed   By: Jeannine Boga M.D.   On: 08/23/2015 22:39   Ct  Angio Chest Aorta W/cm &/or Wo/cm  08/24/2015  CLINICAL DATA:  [place] today after becoming lightheaded. Was discharged from the hospital earlier today. EXAM: CT ANGIOGRAPHY CHEST, ABDOMEN AND PELVIS TECHNIQUE: Multidetector CT imaging through the chest, abdomen and pelvis was performed using the standard protocol during bolus administration of intravenous contrast. Multiplanar reconstructed images and MIPs were obtained and reviewed to evaluate the vascular anatomy. CONTRAST:  1103mL OMNIPAQUE IOHEXOL 350 MG/ML SOLN COMPARISON:  07/23/2013 FINDINGS: CTA CHEST FINDINGS The thoracic aorta is normal in caliber. There is extensive atherosclerotic plaque. There is no dissection. Proximal great vessels are patent. There is good opacification of the pulmonary arteries with no evidence of pulmonary embolism. Review of the MIP images confirms the above findings. Nonvascular chest findings include multiple small thyroid nodules, multiple nonspecific nodes in the mediastinum and both hila, and fibrotic honeycombing in the basilar lung periphery. Central airways are patent. No pleural effusions or pericardial effusions. CTA ABDOMEN AND PELVIS FINDINGS The upper abdominal aorta is normal in caliber with extensive atherosclerotic plaque. There is mild fusiform dilatation of the infrarenal abdominal aorta to a maximum AP diameter 3.4 cm. There is mild stenosis of the celiac axis 1 cm from its origin. Superior mesenteric artery and inferior mesenteric artery are widely patent. Each kidney is served by a widely patent single main renal artery. The common iliac and external iliac arteries are patent bilaterally. No dissection of the abdominal aorta. Review of the MIP images confirms the above findings. Nonvascular findings include extensive colonic diverticulosis without evidence of diverticulitis or other acute inflammatory process. There are unremarkable arterial phase appearances of the liver, spleen, pancreas, adrenals and  kidneys. There is prior cholecystectomy. Bile ducts are unremarkable. Moderate prostatic enlargement. No significant skeletal lesions. IMPRESSION: 1. Extensive atherosclerotic plaque throughout the thoracic and abdominal aorta. There is no dissection. There is a 3.4 cm infrarenal abdominal aortic aneurysm. 2. Negative for acute pulmonary embolism. 3. Multiple tiny thyroid nodules. 4. Diverticulosis. Electronically Signed   By: Andreas Newport M.D.   On: 08/24/2015 00:34   Ct Angio Abd/pel W/ And/or W/o  08/24/2015  CLINICAL DATA:  [place] today after becoming lightheaded. Was discharged from the hospital earlier today. EXAM: CT ANGIOGRAPHY CHEST, ABDOMEN AND PELVIS TECHNIQUE: Multidetector CT imaging through the chest, abdomen and pelvis was performed using the standard protocol during bolus administration of intravenous contrast. Multiplanar reconstructed images and MIPs were obtained and reviewed to evaluate the vascular anatomy. CONTRAST:  117mL OMNIPAQUE IOHEXOL 350 MG/ML SOLN COMPARISON:  07/23/2013 FINDINGS: CTA CHEST FINDINGS The thoracic aorta is normal in caliber. There is extensive atherosclerotic plaque. There is no dissection. Proximal great vessels are patent. There is good opacification of the pulmonary arteries with no evidence of pulmonary embolism. Review of the MIP images confirms the above findings. Nonvascular chest findings include multiple small thyroid nodules, multiple nonspecific nodes in the mediastinum and both hila, and fibrotic honeycombing in  the basilar lung periphery. Central airways are patent. No pleural effusions or pericardial effusions. CTA ABDOMEN AND PELVIS FINDINGS The upper abdominal aorta is normal in caliber with extensive atherosclerotic plaque. There is mild fusiform dilatation of the infrarenal abdominal aorta to a maximum AP diameter 3.4 cm. There is mild stenosis of the celiac axis 1 cm from its origin. Superior mesenteric artery and inferior mesenteric artery are  widely patent. Each kidney is served by a widely patent single main renal artery. The common iliac and external iliac arteries are patent bilaterally. No dissection of the abdominal aorta. Review of the MIP images confirms the above findings. Nonvascular findings include extensive colonic diverticulosis without evidence of diverticulitis or other acute inflammatory process. There are unremarkable arterial phase appearances of the liver, spleen, pancreas, adrenals and kidneys. There is prior cholecystectomy. Bile ducts are unremarkable. Moderate prostatic enlargement. No significant skeletal lesions. IMPRESSION: 1. Extensive atherosclerotic plaque throughout the thoracic and abdominal aorta. There is no dissection. There is a 3.4 cm infrarenal abdominal aortic aneurysm. 2. Negative for acute pulmonary embolism. 3. Multiple tiny thyroid nodules. 4. Diverticulosis. Electronically Signed   By: Andreas Newport M.D.   On: 08/24/2015 00:34     PERTINENT LAB RESULTS: CBC:  Recent Labs  08/29/15 0447  WBC 6.8  HGB 12.3*  HCT 35.6*  PLT 237   CMET CMP     Component Value Date/Time   NA 133* 08/31/2015 0347   K 3.7 08/31/2015 0347   CL 101 08/31/2015 0347   CO2 24 08/31/2015 0347   GLUCOSE 80 08/31/2015 0347   BUN 10 08/31/2015 0347   CREATININE 1.29* 08/31/2015 0347   CALCIUM 8.0* 08/31/2015 0347   PROT 6.1* 08/24/2015 0427   PROT 7.2 07/07/2013 1210   ALBUMIN 2.7* 08/24/2015 0427   AST 84* 08/24/2015 0427   ALT 74* 08/24/2015 0427   ALKPHOS 43 08/24/2015 0427   BILITOT 0.7 08/24/2015 0427   GFRNONAA 54* 08/31/2015 0347   GFRAA >60 08/31/2015 0347    GFR Estimated Creatinine Clearance: 55.1 mL/min (by C-G formula based on Cr of 1.29). No results for input(s): LIPASE, AMYLASE in the last 72 hours. No results for input(s): CKTOTAL, CKMB, CKMBINDEX, TROPONINI in the last 72 hours. Invalid input(s): POCBNP No results for input(s): DDIMER in the last 72 hours. No results for input(s):  HGBA1C in the last 72 hours. No results for input(s): CHOL, HDL, LDLCALC, TRIG, CHOLHDL, LDLDIRECT in the last 72 hours. No results for input(s): TSH, T4TOTAL, T3FREE, THYROIDAB in the last 72 hours.  Invalid input(s): FREET3 No results for input(s): VITAMINB12, FOLATE, FERRITIN, TIBC, IRON, RETICCTPCT in the last 72 hours. Coags:  Recent Labs  08/30/15 1248 08/31/15 0347  INR 1.24 1.22   Microbiology: Recent Results (from the past 240 hour(s))  Urine culture     Status: None   Collection Time: 08/27/15  9:25 AM  Result Value Ref Range Status   Specimen Description URINE, RANDOM  Final   Special Requests UR BAG PED  Final   Culture MULTIPLE SPECIES PRESENT, SUGGEST RECOLLECTION  Final   Report Status 08/28/2015 FINAL  Final  Culture, blood (routine x 2)     Status: None (Preliminary result)   Collection Time: 08/27/15  9:40 AM  Result Value Ref Range Status   Specimen Description BLOOD LEFT ANTECUBITAL  Final   Special Requests BOTTLES DRAWN AEROBIC AND ANAEROBIC 10CC  Final   Culture NO GROWTH 4 DAYS  Final   Report Status PENDING  Incomplete  Culture, blood (routine x 2)     Status: None (Preliminary result)   Collection Time: 08/27/15  9:43 AM  Result Value Ref Range Status   Specimen Description BLOOD RIGHT WRIST  Final   Special Requests BOTTLES DRAWN AEROBIC AND ANAEROBIC 10CC  Final   Culture  Setup Time   Final    GRAM VARIABLE ROD AEROBIC BOTTLE ONLY CRITICAL RESULT CALLED TO, READ BACK BY AND VERIFIED WITH: J. ODDONO,RN AT [id number] ON [id number] BY Rhea Bleacher    Culture   Final    LISTERIA MONOCYTOGENES Results Called to: L HONEYCUTT,RN AT 0911 08/30/15 BY L BENFIELD Standardized susceptibility testing for this organism is not available. HEALTH DEPARTMENT NOTIFIED REFERRED TO [facility] [place] IDENTIFICATION/CONFIRMATION    Report Status PENDING  Incomplete  Culture, blood (routine x 2)     Status: None (Preliminary  result)   Collection Time: 08/28/15 10:10 AM  Result Value Ref Range Status   Specimen Description BLOOD RIGHT HAND  Final   Special Requests BOTTLES DRAWN AEROBIC AND ANAEROBIC 10CC  Final   Culture NO GROWTH 3 DAYS  Final   Report Status PENDING  Incomplete  Culture, blood (routine x 2)     Status: None (Preliminary result)   Collection Time: 08/28/15 10:16 AM  Result Value Ref Range Status   Specimen Description BLOOD LEFT ANTECUBITAL  Final   Special Requests BOTTLES DRAWN AEROBIC AND ANAEROBIC 10CC  Final   Culture NO GROWTH 3 DAYS  Final   Report Status PENDING  Incomplete     BRIEF HOSPITAL COURSE:  Brief narrative:  75 year old male with prior history of CVA, hypertension, rheumatoid arthritis on methotrexate with numerous recurrent admissions over the past 2 weeks.Marland Kitchen He was initially admitted on 11/26 for confusion and was found to have a left small parietal lobe infarct, he was subsequently discharged and returned on 11/30 for syncopal episode. During this admission he had a loop recorder placed. He returned on 12/2 with dizziness and a syncopal episode. 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 He underwent a TEE during this admission that was negative for endocarditis, but showed a large mobile atherosclerotic plaque in the aortic arch and was subsequent restarted on Coumadin after discussion with neurology. Please see below for further details  Hospital course by problem list: Recurrent Syncope: EKG/telemetry negative. Orthostatics on admission negative.Loop recorder interrogation negative for arrhythmias.Recent transthoracic echocardiogram on 11/26 showed preserved ejection fraction.TEE negative for major cardiac abnormalities.I suspect that this is most likely related to fever/bacteremia/neuropathy issues at this time.   Active Problems: Listeria Bacteremia: Found to be febrile this admission, initially one  set of blood culture was positive for gram variable rod-however subsequently came back positive for listeria. Infectious disease was consulted, initially patient was on vancomycin and Zosyn, recommendations are to  switch to ampicillin for a total of 2 weeks  From 12/6. Patient is not encephalopathic, no signs of meningitis-infectious disease does not recommend a lumbar puncture. TEE was negative for endocarditis.   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 Case was discussed with stroke M.D. On 12/6-Dr. Carren Rang reviewed the chart extensively-reviewed TEE and radiological  findings. He recommends Coumadin with aspirin bridge until INR therapeutic-he recommends Coumadin for at least 3 months and a repeat TEE in around 3 months time before discontinuing Coumadin. Plan is to continue aspirin until INR is therapeutic-following which ASA needs to be discontinued.  Please check INR every 1-2 days till therapeutic. Please ensure follow up with Neurology-Dr Leonie Man  ARF:? Etiology. Euvolemic on exam, downtrending with just supportive measures. Creatinine at 1.29 on day of discharge. Please follow lytes closely  Back Pain: much improved-"just sore"-suspect this was secondary to fall/syncope-(hit his back) that occurred on admission. Much better with supportive measures-since improving-suspect we can hold of scanning spine.   Scalp laceration: Secondary to fall-remove sutures in 1 week. TDaP received on 11/29  Hypertension: Controlled-continue amlodipine  Dyslipidemia: Continue statin  History of unspecified peripheral neuropathy:? Related to methotrexate  ? ALS: Reviewed prior note from Dr. Leonie Man on  11/29-he doubts ALS.  History of rheumatoid arthritis: Hold methotrexate-given fever/bacteremia-resume once he completes Abx therapy  Deconditioning/frequent falls: PT evaluation- SNF placement-family/patient agreeable  TODAY-DAY OF DISCHARGE:  Subjective:   Brett Castaneda today has no headache,no chest abdominal pain,no new weakness tingling or numbness  Objective:   Blood pressure 133/84, pulse 76, temperature 98.5 F (36.9 C), temperature source Oral, resp. rate 15, height 5\' 11"  (1.803 m), weight 75.342 kg (166 lb 1.6 oz), SpO2 98 %.  Intake/Output Summary (Last 24 hours) at 08/31/15 1556 Last data filed at 08/31/15 1025  Gross per 24 hour  Intake 1322.5 ml  Output   3570 ml  Net -2247.5 ml   Filed Weights   08/29/15 0500 08/30/15 0430 08/31/15 0500  Weight: 73.755 kg (162 lb 9.6 oz) 75.841 kg (167 lb 3.2 oz) 75.342 kg (166 lb 1.6 oz)    Exam Awake Alert, Oriented *3, No new F.N deficits, Normal affect [place].AT,PERRAL Supple Neck,No JVD, No cervical lymphadenopathy appriciated.  Symmetrical Chest wall movement, Good air movement bilaterally, CTAB RRR,No Gallops,Rubs or new Murmurs, No Parasternal Heave +ve B.Sounds, Abd Soft, Non tender, No organomegaly appriciated, No rebound -guarding or rigidity. No Cyanosis, Clubbing or edema, No new Rash or bruise  DISCHARGE CONDITION: Stable  DISPOSITION: SNF  DISCHARGE INSTRUCTIONS:    Activity:  As tolerated with Full fall precautions use walker/cane & assistance as needed  Get Medicines reviewed and adjusted: Please take all your medications with you for your next visit with your Primary MD  Please request your Primary MD to go over all hospital tests and procedure/radiological results at the follow up, please ask your Primary MD to get all Hospital records sent to his/her office.  If you experience worsening of your admission symptoms, develop shortness of breath, life threatening emergency, suicidal or homicidal thoughts  you must seek medical attention immediately by calling 911 or calling your MD immediately  if symptoms less severe.  You must read complete instructions/literature along with all the possible adverse reactions/side effects for all the Medicines you take and that have been prescribed to you. Take any new Medicines after you have completely understood and accpet all the possible adverse reactions/side effects.   Do not drive when taking Pain medications.   Do not take more than prescribed Pain, Sleep and Anxiety Medications  Special Instructions: If you have smoked or chewed Tobacco  in the last 2 yrs please stop smoking, stop any regular Alcohol  and or any Recreational drug use.  Wear Seat belts while driving.  Please note  You were cared for by a hospitalist during  your hospital stay. Once you are discharged, your primary care physician will handle any further medical issues. Please note that NO REFILLS for any discharge medications will be authorized once you are discharged, as it is imperative that you return to your primary care physician (or establish a relationship with a primary care physician if you do not have one) for your aftercare needs so that they can reassess your need for medications and monitor your lab values.   Diet recommendation: Diabetic Diet Heart Healthy diet  Discharge Instructions    Ambulatory referral to Neurology    Complete by:  As directed      Call MD for:  persistant dizziness or light-headedness    Complete by:  As directed      Call MD for:  temperature >100.4    Complete by:  As directed      Diet - low sodium heart healthy    Complete by:  As directed   Fluid restriction 1500 cc/day     Increase activity slowly    Complete by:  As directed            Follow-up Information    Schedule an appointment as soon as possible for a visit with Nyoka Cowden, MD.   Specialty:  Internal Medicine   Contact information:   [place] [address] [phone]       Follow up with SETHI,PRAMOD, MD. Schedule an appointment as soon as possible for a visit in 1 week.   Specialties:  Neurology, Radiology   Why:  Hospital follow up   Contact information:   [address] [place] [address] [phone]       Follow up with [name], MD. Schedule an appointment as soon as possible for a visit in 2 weeks.   Specialty:  Infectious Diseases   Contact information:   [address] [place] [address] [phone]      Total Time spent on discharge equals 45 minutes.  SignedOren Binet 08/31/2015 3:56 PM

## 2015-08-31 NOTE — Clinical Social Work Note (Signed)
Clinical Social Worker facilitated patient discharge including contacting patient family and facility to confirm patient discharge plans.  Clinical information faxed to facility and family agreeable with plan.  CSW arranged ambulance transport via [facility] to [facility].  RN to call report prior to discharge.  Clinical Social Worker will sign off for now as social work intervention is no longer needed. Please consult us again if new need arises.  Jesse Merinda Victorino, LCSW [phone] 

## 2015-08-31 NOTE — Progress Notes (Signed)
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 Dopamine Anaphylaxis  . Morphine And Related Other (See Comments)    Hallucinations, very bad reactions  . Vicodin [Hydrocodone-Acetaminophen] Nausea And Vomiting    Patient Measurements: Height: 5\' 11"  (180.3 cm) Weight: 166 lb 1.6 oz (75.342 kg) IBW/kg (Calculated) : 75.3  Vital Signs: Temp: 98.4 F (36.9 C) (12/07 0500) Temp Source: Oral (12/07 0500) BP: 140/76 mmHg (12/07 0500) Pulse Rate: 75 (12/07 0500)  Labs:  Recent Labs  08/29/15 0447 08/30/15 0405 08/30/15 1248 08/31/15 0347  HGB 12.3*  --   --   --   HCT 35.6*  --   --   --   PLT 237  --   --   --   LABPROT  --   --  15.7* 15.5*  INR  --   --  1.24 1.22  CREATININE 1.24 1.33*  --  1.29*    Estimated Creatinine Clearance: 55.1 mL/min (by C-G formula based on Cr of 1.29).   Medical History: Past Medical History  Diagnosis Date  . BASAL CELL CARCINOMA, FACE 03/31/2009  . 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 CONSTIPATION 06/22/2008  . DIVERTICULOSIS, COLON 03/24/2007  . GERD 03/24/2007  . HYPERLIPIDEMIA 08/27/2007  . HYPERTENSION 12/14/2008  . Rheumatoid arthritis(714.0) 03/24/2007  . Blind right eye   . Retinal detachment     hx of  . Keratosis Oct. 2013  . Fall at home Nov. 5, 2014  06-28-13    in the home and Outside as well  . 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 Stroke [facility]) Oct 2014  . 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 Stroke MD Erlinda Hong) wants coumadin with aspirin bridge till INR therapeutic. No anticoag pta.  Baseline INR 1.24 today. Remains subtherapeutic at 1.22 this AM. Warfarin predictor pts = 5. No high-risk characteristics for lower initial dose. LFTs were mildly elevated several days ago, no recheck yet.  Goal of Therapy:  INR 2-3 Monitor platelets by anticoagulation protocol: Yes   Plan:  Warfarin 7.5mg  x 1 dose tonight Asa 325mg  QD per Dr. Erlinda Hong - bridge until INR therapeutic Daily INR Mon s/sx bleeding  Elicia Lamp, PharmD, [facility] Clinical Pharmacist Pager [phone] 08/31/2015 11:21 AM

## 2015-08-31 NOTE — Progress Notes (Signed)
Peripherally Inserted Central Catheter/Midline Placement  The IV Nurse has discussed with the patient and/or persons authorized to consent for the patient, the purpose of this procedure and the potential benefits and risks involved with this procedure.  The benefits include less needle sticks, lab draws from the catheter and patient may be discharged home with the catheter.  Risks include, but not limited to, infection, bleeding, blood clot (thrombus formation), and puncture of an artery; nerve damage and irregular heat beat.  Alternatives to this procedure were also discussed.  PICC/Midline Placement Documentation        Brett Castaneda 08/31/2015, 1:44 PM

## 2015-08-31 NOTE — Plan of Care (Signed)
Problem: Skin Integrity: Goal: Risk for impaired skin integrity will decrease Outcome: Progressing The patient has two lacerations on his head from a fall. Both are healing with no drainage. The posterior laceration has staples; the anterior laceration had sutures that were removed today.  Problem: Bowel/Gastric: Goal: Will not experience complications related to bowel motility Outcome: Progressing Patient had been experiencing constipation but was able to have a bowel movement on 12/6 for the first time in several days.

## 2015-09-01 LAB — CULTURE, BLOOD (ROUTINE X 2): Culture: NO GROWTH

## 2015-09-02 ENCOUNTER — Telehealth: Payer: Self-pay | Admitting: *Deleted

## 2015-09-02 LAB — CULTURE, BLOOD (ROUTINE X 2)
CULTURE: NO GROWTH
CULTURE: NO GROWTH

## 2015-09-02 NOTE — Telephone Encounter (Signed)
Wife left a message that the patient is now at a skilled nursing facility. FYI

## 2015-09-02 NOTE — Telephone Encounter (Signed)
Left message on machine for patient to return our call.  1st attempt.  Patient was discharged 08/31/15 and has an appointment 09/12/15 with Dr Raliegh Ip.

## 2015-09-05 ENCOUNTER — Ambulatory Visit (INDEPENDENT_AMBULATORY_CARE_PROVIDER_SITE_OTHER): Payer: Medicare Other | Admitting: *Deleted

## 2015-09-05 ENCOUNTER — Telehealth: Payer: Self-pay | Admitting: Neurology

## 2015-09-05 DIAGNOSIS — R55 Syncope and collapse: Secondary | ICD-10-CM

## 2015-09-05 LAB — CUP PACEART INCLINIC DEVICE CHECK: MDC IDC SESS DTM: 20161212170636

## 2015-09-05 NOTE — Telephone Encounter (Signed)
Rn call Francesca Jewett back at [facility] about scheduling within a week. Rn explain that Dr.Sethi and Dr Erlinda Hong will be out of the office for the next two weeks. Rn explain patient was schedule for January appt. Rosemary verify appt for 0930 and check in at 0900am on September 29 2015. Francesca Jewett is the scheduler.

## 2015-09-05 NOTE — Progress Notes (Signed)
Loop wound check in clinic. Steri-Strips removed. Noted wound partially healed, part of incision poorly approximated, no active drainage despite manipulation of incision, slight redness and tenderness around insertion site. Patient denies fevers or chills, as well as swelling at insertion site. JA saw patient, wound edges reapproximated and Steri-Strips reapplied.   Battery status: good. R-waves 0.72mV. No symptom, tachy, pause, brady, or AF episodes. Due to patient's history of CVA, AF detection reprogrammed to balanced sensitivity and AT/AF recording threshold reprogrammed to all episodes.  Patient to return for wound re-check on 09/15/15 at 10:30am.

## 2015-09-05 NOTE — Telephone Encounter (Signed)
Rosemary/[facility] [phone] called to schedule 1 week follow-up appointment with Dr. Leonie Man (according to 12/7 discharge summary 08/31/15)

## 2015-09-06 ENCOUNTER — Non-Acute Institutional Stay (SKILLED_NURSING_FACILITY): Payer: Medicare Other | Admitting: Internal Medicine

## 2015-09-06 DIAGNOSIS — K5900 Constipation, unspecified: Secondary | ICD-10-CM | POA: Diagnosis not present

## 2015-09-06 DIAGNOSIS — I639 Cerebral infarction, unspecified: Secondary | ICD-10-CM

## 2015-09-06 DIAGNOSIS — D638 Anemia in other chronic diseases classified elsewhere: Secondary | ICD-10-CM | POA: Diagnosis not present

## 2015-09-06 DIAGNOSIS — R55 Syncope and collapse: Secondary | ICD-10-CM | POA: Diagnosis not present

## 2015-09-06 DIAGNOSIS — M069 Rheumatoid arthritis, unspecified: Secondary | ICD-10-CM | POA: Diagnosis not present

## 2015-09-06 DIAGNOSIS — R5381 Other malaise: Secondary | ICD-10-CM | POA: Diagnosis not present

## 2015-09-06 DIAGNOSIS — K219 Gastro-esophageal reflux disease without esophagitis: Secondary | ICD-10-CM

## 2015-09-06 DIAGNOSIS — E871 Hypo-osmolality and hyponatremia: Secondary | ICD-10-CM | POA: Diagnosis not present

## 2015-09-06 DIAGNOSIS — S0101XS Laceration without foreign body of scalp, sequela: Secondary | ICD-10-CM

## 2015-09-06 DIAGNOSIS — I10 Essential (primary) hypertension: Secondary | ICD-10-CM

## 2015-09-06 DIAGNOSIS — R7881 Bacteremia: Secondary | ICD-10-CM | POA: Diagnosis not present

## 2015-09-06 DIAGNOSIS — I7090 Unspecified atherosclerosis: Secondary | ICD-10-CM

## 2015-09-06 LAB — CBC AND DIFFERENTIAL
HEMATOCRIT: 32 % — AB (ref 41–53)
HEMOGLOBIN: 10.6 g/dL — AB (ref 13.5–17.5)
PLATELETS: 330 10*3/uL (ref 150–399)
WBC: 11.4 10*3/mL

## 2015-09-06 LAB — BASIC METABOLIC PANEL
BUN: 13 mg/dL (ref 4–21)
Creatinine: 1.3 mg/dL (ref 0.6–1.3)
GLUCOSE: 82 mg/dL
Potassium: 4 mmol/L (ref 3.4–5.3)
SODIUM: 135 mmol/L — AB (ref 137–147)

## 2015-09-06 NOTE — Progress Notes (Signed)
Patient ID: Brett Castaneda, male   DOB: 1940-12-11, 74 y.o.   MRN: [id number]     Facility: [facility]    PCP: Nyoka Cowden, MD  Code Status: full code  Allergies  Allergen Reactions  . Dopamine Anaphylaxis  . Morphine And Related Other (See Comments)    Hallucinations, very bad reactions  . Vicodin [Hydrocodone-Acetaminophen] Nausea And Vomiting    Chief Complaint  Patient presents with  . New Admit To SNF     HPI:  74 y.o. patient is here for short term rehabilitation post hospital admission from 08/26/15-08/31/15 with a syncopal episode. 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 ID was consulted and he was started on antibiotics. He underwent TEE that was negative for endocarditis, but showed a large mobile atherosclerotic plaque in the aortic arch and was started on Coumadin along with aspirin until inr becomes therapeutic. Of note he has had recurrent syncope. EKG/ telemetry was negative, orthostatic negative. He had a loop recorder which was negative for arrythmia. Echocardiogram showed normal EF. He also had hyponatremia and acute renal failure. He had scalp laceration and has sutures in place. He has past medical history of CVA, hypertension, rheumatoid arthritis on methotrexate, HTN, HLD among others. His methotrexate is on hold until antibiotic is completed.   Review of Systems:  Constitutional: Negative for fever, chills, diaphoresis.  HENT: Negative for headache, congestion, nasal discharge Eyes: Negative for blurred vision.  Respiratory: Negative for cough, shortness of breath and wheezing.   Cardiovascular: Negative for chest pain, palpitations, leg swelling.  Gastrointestinal: positive for heartburn. Negative for nausea, vomiting, abdominal pain. Had bowel movement yesterday Genitourinary: Negative for dysuria  Musculoskeletal: Negative for back pain, fall Skin: Negative for itching, rash.    Neurological: positive for weakness Psychiatric/Behavioral: Negative for depression    Past Medical History  Diagnosis Date  . BASAL CELL CARCINOMA, FACE 03/31/2009  . 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 CONSTIPATION 06/22/2008  . DIVERTICULOSIS, COLON 03/24/2007  . GERD 03/24/2007  . HYPERLIPIDEMIA 08/27/2007  . HYPERTENSION 12/14/2008  . Rheumatoid arthritis(714.0) 03/24/2007  . Blind right eye   . Retinal detachment     hx of  . Keratosis Oct. 2013  . Fall at home Nov. 5, 2014  06-28-13    in the home and Outside as well  . 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 Stroke [facility]) Oct 2014  . 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 Total knee arthroplasty      bital  . Carpal tunnel release Right 2003  . Cataract extraction  1980    removed cataract in left eye. Currently blind in right eye  . Cholecystectomy  2001    Gall Bladder  . Retinal detachment surgery  2004  . Lumbar puncture  Oct. 21, 2014  . Nerve conduction    . Mri  07-14-13  . Carotid endarterectomy Right 08-15-07    cea  . Joint replacement  2000    Right knee replacement  . Eye surgery  [id number]    retinal detachment  . Eye surgery      left cataract surgery  . Shoulder surgery Left 2009    hx "frozen" shoulder  . Ep implantable device N/A 08/25/2015  Procedure: Loop Recorder Insertion;  Surgeon: Will Meredith Leeds, MD;  Location: [place];  Service: Cardiovascular;  Laterality: N/A;  . Tee without cardioversion N/A 08/30/2015    Procedure: TRANSESOPHAGEAL ECHOCARDIOGRAM (TEE);  Surgeon: Larey Dresser, MD;  Location: [facility] [place];  Service: Cardiovascular;  Laterality: N/A;   Social History:   reports that he quit smoking about 34 years ago. His smoking use included Cigarettes. He quit after 23 years of use. He  has never used smokeless tobacco. He reports that he does not drink alcohol or use illicit drugs.  Family History  Problem Relation Age of Onset  . Stroke Mother 85  . Hypertension Mother   . Heart disease Father   . Aneurysm Father   . Coronary artery disease Sister   . Pneumonia Sister   . Heart disease Sister   . Diabetes Sister   . Hypertension Sister   . Colon cancer Neg Hx     Medications:   Medication List       This list is accurate as of: 09/06/15  9:36 AM.  Always use your most recent med list.               acetaminophen 500 MG tablet  Commonly known as:  TYLENOL  Take 500 mg by mouth every 6 (six) hours as needed for mild pain, moderate pain or fever.     alendronate 70 MG tablet  Commonly known as:  FOSAMAX  Take 70 mg by mouth once a week. Take with a full glass of water on an empty stomach. Takes on Wednesday     amLODipine 5 MG tablet  Commonly known as:  NORVASC  Take 1 tablet (5 mg total) by mouth daily.     ampicillin 2 g in sodium chloride 0.9 % 50 mL  Inject 2 g into the vein every 6 (six) hours. 2 Weeks from 08/30/15     aspirin EC 325 MG tablet  Take 1 tablet (325 mg total) by mouth daily. Discontinue when INR therapeutic (more than 2)     latanoprost 0.005 % ophthalmic solution  Commonly known as:  XALATAN  Place 1 drop into the left eye at bedtime.     meclizine 50 MG tablet  Commonly known as:  ANTIVERT  Take 1 tablet (50 mg total) by mouth 3 (three) times daily as needed.     methotrexate 2.5 MG tablet  Commonly known as:  RHEUMATREX  Take 4 tablets (10 mg total) by mouth once a week. Resume once patient has completed IV antibiotics.  Start taking on:  09/14/2015     OCUVITE ADULT 50+ Caps  Take 1 capsule by mouth daily.     oxyCODONE 5 MG immediate release tablet  Commonly known as:  Oxy IR/ROXICODONE  Take 1 tablet (5 mg total) by mouth every 6 (six) hours as needed for moderate pain.     pantoprazole 40 MG tablet  Commonly  known as:  PROTONIX  Take 1 tablet (40 mg total) by mouth daily at 12 noon.     polyethylene glycol packet  Commonly known as:  MIRALAX / GLYCOLAX  Take 17 g by mouth daily as needed (for constipation).     simvastatin 40 MG tablet  Commonly known as:  ZOCOR  Take 1 tablet (40 mg total) by mouth daily at 6 PM.     sodium chloride 0.65 % Soln nasal spray  Commonly known as:  OCEAN  Place 1 spray into  both nostrils 3 (three) times daily.     tamsulosin 0.4 MG Caps capsule  Commonly known as:  FLOMAX  Take 0.4 mg by mouth daily.     timolol 0.5 % ophthalmic solution  Commonly known as:  TIMOPTIC  Place 1 drop into the left eye every evening.     VIRT-VITE FORTE 2.5-25-2 MG Tabs tablet  Generic drug:  folic acid-pyridoxine-cyancobalamin  Take 1 tablet by mouth once a week. Takes on days he takes methotrexate. (Saturdays)     warfarin 7.5 MG tablet  Commonly known as:  COUMADIN  Take 1 tablet (7.5 mg total) by mouth daily at 6 PM.         Physical Exam: Filed Vitals:   09/06/15 0934  BP: 128/81  Pulse: 76  Temp: 98.1 F (36.7 C)  Resp: 18  SpO2: 99%    General- elderly male, in no acute distress Head- normocephalic, atraumatic Nose- no maxillary or frontal sinus tenderness, no nasal discharge Throat- moist mucus membrane Eyes- PERRLA, EOMI, no pallor, no icterus Neck- no cervical lymphadenopathy Cardiovascular- normal s1,s2, no murmurs, loop recorder in place with steristrip Respiratory- bilateral clear to auscultation, no wheeze, no rhonchi, no crackles, no use of accessory muscles Abdomen- bowel sounds present, soft, non tender Musculoskeletal- able to move all 4 extremities, generalized weakness  Neurological- alert and oriented to person, place and time Skin- warm and dry, left UE picc line, scalp laceration is healing well and sutures have been removed   Labs reviewed: Basic Metabolic Panel:  Recent Labs  08/24/15 1017  08/29/15 0447 08/30/15 0405  08/31/15 0347  NA  --   < > 129* 130* 133*  K  --   < > 3.8 3.5 3.7  CL  --   < > 98* 97* 101  CO2  --   < > 24 26 24   GLUCOSE  --   < > 135* 118* 80  BUN  --   < > 9 12 10   CREATININE  --   < > 1.24 1.33* 1.29*  CALCIUM  --   < > 8.0* 8.2* 8.0*  MG 1.8  --   --   --   --   < > = values in this interval not displayed. Liver Function Tests:  Recent Labs  08/19/15 2044 08/23/15 2207 08/24/15 0427  AST 25 126* 84*  ALT 17 96* 74*  ALKPHOS 46 50 43  BILITOT 0.7 1.6* 0.7  PROT 7.0 6.8 6.1*  ALBUMIN 3.4* 3.1* 2.7*   No results for input(s): LIPASE, AMYLASE in the last 8760 hours. No results for input(s): AMMONIA in the last 8760 hours. CBC:  Recent Labs  08/19/15 2044  08/24/15 0427 08/26/15 1502 08/27/15 0634 08/28/15 0816 08/29/15 0447  WBC 7.0  < > 6.7 4.5 5.4 6.6 6.8  NEUTROABS 4.3  --  4.9 3.1  --   --   --   HGB 12.5*  < > 11.0* 12.5* 12.3* 11.9* 12.3*  HCT 36.2*  < > 32.8* 36.5* 36.9* 35.5* 35.6*  MCV 92.8  < > 92.4 92.4 92.7 91.5 91.3  PLT 126*  < > 212 256 234 219 237  < > = values in this interval not displayed. Cardiac Enzymes:  Recent Labs  08/27/15 0135 08/27/15 0634 08/27/15 1255  TROPONINI 0.03 0.03 0.03   BNP: Invalid input(s): POCBNP CBG:  Recent Labs  08/19/15 2046  GLUCAP 118*    Radiological Exams: Dg Chest Port 1 View  08/27/2015  CLINICAL DATA:  Fever with back pain. History of ALS, CVA and rheumatoid arthritis. EXAM: PORTABLE CHEST 1 VIEW COMPARISON:  CT and radiographs 08/23/2015. FINDINGS: 0831 hours. There are lower lung volumes. Patient has chronic fibrotic changes at both lung bases with probable mild superimposed atelectasis related to the lower lung volumes. There is no consolidation or significant pleural effusion. The heart size and mediastinal contours are stable. IMPRESSION: Stable fibrotic changes at both lung bases with mild superimposed atelectasis. No evidence of pneumonia. Electronically Signed   By: Richardean Sale  M.D.   On: 08/27/2015 10:45     Assessment/Plan  Physical deconditioning Will have him work with physical therapy and occupational therapy team to help with gait training and muscle strengthening exercises.fall precautions. Skin care. Encourage to be out of bed.   Syncope Unspecified type. No syncope in the facility. Monitor clinically and have him work with therapy team  Listeria bacteremia Continue ampicillin via picc line until 09/14/15. Monitor cbc with diff and temp curve. Will need blood culture at completion of antibiotic.   Aortic arch atherosclerotic plaque INR 5.9 yesterday. Coumadin on hold at present. Discontinue aspirin. inr today 4.4. Continue to hold coumadin and check inr 09/07/15.  Essential hypertension Stable, monitor BP and continue norvasc  Hyponatremia Monitor bmp  RA Stable. Methotrexate on hold for now as he is getting the antibiotic. Continue oxyIR 5 mg q6h prn pain  Scalp laceration Healing well, staples have been removed. Monitor clinically  CVA Continue coumadin. Monitor BP. Has f/u with Dr Leonie Man  Anemia of chronic disease With his history of RA. Monitor cbc  Constipation Continue miralax  gerd Increase protonix to 40 mg bid and monitor   Goals of care: short term rehabilitation   Labs/tests ordered: cbc with diff, cmp, inr  Family/ staff Communication: reviewed care plan with patient and nursing supervisor    Blanchie Serve, MD  [facility] [phone] (Monday-Friday 8 am - 5 pm) [phone] (afterhours)

## 2015-09-12 ENCOUNTER — Non-Acute Institutional Stay (SKILLED_NURSING_FACILITY): Payer: Medicare Other | Admitting: Nurse Practitioner

## 2015-09-12 ENCOUNTER — Ambulatory Visit: Payer: Self-pay | Admitting: Internal Medicine

## 2015-09-12 DIAGNOSIS — M069 Rheumatoid arthritis, unspecified: Secondary | ICD-10-CM | POA: Diagnosis not present

## 2015-09-12 DIAGNOSIS — R55 Syncope and collapse: Secondary | ICD-10-CM

## 2015-09-12 DIAGNOSIS — Z8673 Personal history of transient ischemic attack (TIA), and cerebral infarction without residual deficits: Secondary | ICD-10-CM | POA: Diagnosis not present

## 2015-09-12 DIAGNOSIS — D6489 Other specified anemias: Secondary | ICD-10-CM

## 2015-09-12 DIAGNOSIS — R7881 Bacteremia: Secondary | ICD-10-CM | POA: Diagnosis not present

## 2015-09-12 DIAGNOSIS — R531 Weakness: Secondary | ICD-10-CM | POA: Diagnosis not present

## 2015-09-12 DIAGNOSIS — I10 Essential (primary) hypertension: Secondary | ICD-10-CM | POA: Diagnosis not present

## 2015-09-12 NOTE — Progress Notes (Signed)
Patient ID: Brett Castaneda, male   DOB: [birth year], 74 y.o.   MRN: [id number]    Nursing Home Location:  [place] of Service: SNF (31)  PCP: Nyoka Cowden, MD  Allergies  Allergen Reactions  . Dopamine Anaphylaxis  . Morphine And Related Other (See Comments)    Hallucinations, very bad reactions  . Vicodin [Hydrocodone-Acetaminophen] Nausea And Vomiting    Chief Complaint  Patient presents with  . Discharge Note    HPI:  Patient is a 74 y.o. male seen today at [facility] for discharge home. He has past medical history of CVA, hypertension, rheumatoid arthritis on methotrexate, HTN, HLD among others. He is at [facility] place for short term rehabilitation after hospitalization from 08/26/15-08/31/15 with a syncopal episode. 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 ID was consulted and he was started on antibiotics. He underwent TEE that was negative for endocarditis, but showed a large mobile atherosclerotic plaque in the aortic arch and was started on Coumadin. Pt completes IV antibiotics on 12/21 and plans to go home 12/22. Patient currently doing well with therapy, will discharge home with home health.  Review of Systems:  Review of Systems  Constitutional: Negative for activity change, appetite change, fatigue and unexpected weight change.  HENT: Negative for congestion and hearing loss.   Eyes: Negative.   Respiratory: Negative for cough and shortness of breath.   Cardiovascular: Negative for chest pain, palpitations and leg swelling.  Gastrointestinal: Negative for abdominal pain, diarrhea and constipation.  Genitourinary: Negative for dysuria and difficulty urinating.  Musculoskeletal: Negative for myalgias and arthralgias.  Skin: Negative for color change and wound.  Neurological: Positive for weakness (generalized). Negative for dizziness.  Psychiatric/Behavioral: Negative for behavioral  problems, confusion and agitation.    Past Medical History  Diagnosis Date  . BASAL CELL CARCINOMA, FACE 03/31/2009  . 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 CONSTIPATION 06/22/2008  . DIVERTICULOSIS, COLON 03/24/2007  . GERD 03/24/2007  . HYPERLIPIDEMIA 08/27/2007  . HYPERTENSION 12/14/2008  . Rheumatoid arthritis(714.0) 03/24/2007  . Blind right eye   . Retinal detachment     hx of  . Keratosis Oct. 2013  . Fall at home Nov. 5, 2014  06-28-13    in the home and Outside as well  . 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 Stroke [facility]) Oct 2014  . 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 Total knee arthroplasty      bital  . Carpal tunnel release Right 2003  . Cataract extraction  1980    removed cataract in left eye. Currently blind in right eye  . Cholecystectomy  2001    Gall Bladder  . Retinal detachment surgery  2004  . Lumbar puncture  Oct. 21, 2014  . Nerve conduction    . Mri  07-14-13  . Carotid endarterectomy Right 08-15-07    cea  . Joint replacement  2000    Right knee replacement  . Eye surgery  [id number]    retinal detachment  . Eye surgery      left cataract surgery  . Shoulder surgery Left 2009    hx "frozen" shoulder  . Ep implantable device N/A 08/25/2015    Procedure:  Loop Recorder Insertion;  Surgeon: Will Meredith Leeds, MD;  Location: [place] [facility];  Service: Cardiovascular;  Laterality: N/A;  . Tee without cardioversion N/A 08/30/2015    Procedure: TRANSESOPHAGEAL ECHOCARDIOGRAM (TEE);  Surgeon: Larey Dresser, MD;  Location: [place];  Service: Cardiovascular;  Laterality: N/A;   Social History:   reports that he quit smoking about 34 years ago. His smoking use included Cigarettes. He quit after 23 years of use. He has never used smokeless tobacco. He reports that he  does not drink alcohol or use illicit drugs.  Family History  Problem Relation Age of Onset  . Stroke Mother 19  . Hypertension Mother   . Heart disease Father   . Aneurysm Father   . Coronary artery disease Sister   . Pneumonia Sister   . Heart disease Sister   . Diabetes Sister   . Hypertension Sister   . Colon cancer Neg Hx     Medications: Patient's Medications  New Prescriptions   No medications on file  Previous Medications   ACETAMINOPHEN (TYLENOL) 500 MG TABLET    Take 500 mg by mouth every 6 (six) hours as needed for mild pain, moderate pain or fever.    ALENDRONATE (FOSAMAX) 70 MG TABLET    Take 70 mg by mouth once a week. Take with a full glass of water on an empty stomach. Takes on Wednesday   AMLODIPINE (NORVASC) 5 MG TABLET    Take 1 tablet (5 mg total) by mouth daily.   AMPICILLIN 2 G IN SODIUM CHLORIDE 0.9 % 50 ML    Inject 2 g into the vein every 6 (six) hours. 2 Weeks from 08/30/15   ASPIRIN EC 325 MG TABLET    Take 1 tablet (325 mg total) by mouth daily. Discontinue when INR therapeutic (more than 2)   FOLIC ACID-PYRIDOXINE-CYANCOBALAMIN (VIRT-VITE FORTE) 2.5-25-2 MG TABS    Take 1 tablet by mouth once a week. Takes on days he takes methotrexate. (Saturdays)   LATANOPROST (XALATAN) 0.005 % OPHTHALMIC SOLUTION    Place 1 drop into the left eye at bedtime.    MECLIZINE (ANTIVERT) 50 MG TABLET    Take 1 tablet (50 mg total) by mouth 3 (three) times daily as needed.   METHOTREXATE (RHEUMATREX) 2.5 MG TABLET    Take 4 tablets (10 mg total) by mouth once a week. Resume once patient has completed IV antibiotics.   MULTIPLE VITAMINS-MINERALS (OCUVITE ADULT 50+) CAPS    Take 1 capsule by mouth daily.   OXYCODONE (OXY IR/ROXICODONE) 5 MG IMMEDIATE RELEASE TABLET    Take 1 tablet (5 mg total) by mouth every 6 (six) hours as needed for moderate pain.   PANTOPRAZOLE (PROTONIX) 40 MG TABLET    Take 1 tablet (40 mg total) by mouth daily at 12 noon.   POLYETHYLENE GLYCOL (MIRALAX /  GLYCOLAX) PACKET    Take 17 g by mouth daily as needed (for constipation).   SIMVASTATIN (ZOCOR) 40 MG TABLET    Take 1 tablet (40 mg total) by mouth daily at 6 PM.   SODIUM CHLORIDE (OCEAN) 0.65 % SOLN NASAL SPRAY    Place 1 spray into both nostrils 3 (three) times daily.   TAMSULOSIN (FLOMAX) 0.4 MG CAPS CAPSULE    Take 0.4 mg by mouth daily.   TIMOLOL (TIMOPTIC) 0.5 % OPHTHALMIC SOLUTION    Place 1 drop into the left eye every evening.    WARFARIN (COUMADIN) 7.5 MG TABLET    Take  1 tablet (7.5 mg total) by mouth daily at 6 PM.  Modified Medications   No medications on file  Discontinued Medications   No medications on file     Physical Exam: Filed Vitals:   09/12/15 1434  BP: 126/84  Pulse: 90  Temp: 98.9 F (37.2 C)  Resp: 20    Physical Exam  Constitutional: He is oriented to person, place, and time. He appears well-developed and well-nourished. No distress.  HENT:  Head: Normocephalic and atraumatic.  Mouth/Throat: Oropharynx is clear and moist. No oropharyngeal exudate.  Eyes: Conjunctivae and EOM are normal. Pupils are equal, round, and reactive to light.  Neck: Normal range of motion. Neck supple.  Cardiovascular: Normal rate, regular rhythm and normal heart sounds.   Pulmonary/Chest: Effort normal and breath sounds normal.  Abdominal: Soft. Bowel sounds are normal.  Musculoskeletal: He exhibits no edema or tenderness.  Neurological: He is alert and oriented to person, place, and time.  Skin: Skin is warm and dry. He is not diaphoretic.  Psychiatric: He has a normal mood and affect.    Labs reviewed: Basic Metabolic Panel:  Recent Labs  08/24/15 1017  08/29/15 0447 08/30/15 0405 08/31/15 0347 09/06/15  NA  --   < > 129* 130* 133* 135*  K  --   < > 3.8 3.5 3.7 4.0  CL  --   < > 98* 97* 101  --   CO2  --   < > 24 26 24   --   GLUCOSE  --   < > 135* 118* 80  --   BUN  --   < > 9 12 10 13   CREATININE  --   < > 1.24 1.33* 1.29* 1.3  CALCIUM  --   < > 8.0*  8.2* 8.0*  --   MG 1.8  --   --   --   --   --   < > = values in this interval not displayed. Liver Function Tests:  Recent Labs  08/19/15 2044 08/23/15 2207 08/24/15 0427  AST 25 126* 84*  ALT 17 96* 74*  ALKPHOS 46 50 43  BILITOT 0.7 1.6* 0.7  PROT 7.0 6.8 6.1*  ALBUMIN 3.4* 3.1* 2.7*   No results for input(s): LIPASE, AMYLASE in the last 8760 hours. No results for input(s): AMMONIA in the last 8760 hours. CBC:  Recent Labs  08/19/15 2044  08/24/15 0427 08/26/15 1502 08/27/15 0634 08/28/15 0816 08/29/15 0447 09/06/15  WBC 7.0  < > 6.7 4.5 5.4 6.6 6.8 11.4  NEUTROABS 4.3  --  4.9 3.1  --   --   --   --   HGB 12.5*  < > 11.0* 12.5* 12.3* 11.9* 12.3* 10.6*  HCT 36.2*  < > 32.8* 36.5* 36.9* 35.5* 35.6* 32*  MCV 92.8  < > 92.4 92.4 92.7 91.5 91.3  --   PLT 126*  < > 212 256 234 219 237 330  < > = values in this interval not displayed. TSH:  Recent Labs  05/03/15 1030  TSH 1.80   A1C: Lab Results  Component Value Date   HGBA1C 5.5 08/21/2015   Lipid Panel:  Recent Labs  05/03/15 1030 08/20/15 0323  CHOL 152 137  HDL 34.50* 25*  LDLCALC 101* 98  TRIG 80.0 72  CHOLHDL 4 5.5    Assessment/Plan 1. Syncope, unspecified syncope type Stable in rehab, during hospitalization extensive workup was negative and thought to be related to bacteremia.   2.  Essential hypertension Stable, conts on norvasc  3. Rheumatoid arthritis involving elbow, unspecified laterality, unspecified rheumatoid factor presence (HCC) Stable, conts on methotrexate once antibiotics complete  4. Bacteremia conts on IV unitl 12/21, will follow up cbc with diff and blood cultures once complete prior to discharging home. PICC will also be removed prior to DC home if WBC trending down.  5. Anemia due to other cause Stable however drop in hgb noted on recent lab, will follow up cbc prior to discharge.   6. Hyponatremia Stable, Na of 135 on recent labs  7. Recent CVA Mobile  atherosclerotic block in the aortic arch and Case was discussed with stroke M.D. Recommendation was for Coumadin for at least 3 months and a repeat TEE in around 3 months time before discontinuing Coumadin.  conts on coumadin and will follow up with Neurology  8. Weakness Weakness improving with therapy, pt is stable for discharge-will need PT/OT/HHA/Nursing per home health. No DME needed. Rx written.  will need to follow up with PCP within 2 weeks.  Will need nursing for INR management    Cynda Soule K. Harle Battiest  [facility] [phone] 8 am - 5 pm) [phone] (after hours)

## 2015-09-15 ENCOUNTER — Ambulatory Visit: Payer: Medicare Other

## 2015-09-20 DIAGNOSIS — R531 Weakness: Secondary | ICD-10-CM | POA: Diagnosis not present

## 2015-09-20 DIAGNOSIS — M069 Rheumatoid arthritis, unspecified: Secondary | ICD-10-CM

## 2015-09-20 DIAGNOSIS — G1221 Amyotrophic lateral sclerosis: Secondary | ICD-10-CM

## 2015-09-20 DIAGNOSIS — I10 Essential (primary) hypertension: Secondary | ICD-10-CM | POA: Diagnosis not present

## 2015-09-20 DIAGNOSIS — H5411 Blindness, right eye, low vision left eye: Secondary | ICD-10-CM

## 2015-09-20 DIAGNOSIS — R55 Syncope and collapse: Secondary | ICD-10-CM

## 2015-09-20 LAB — CULTURE, BLOOD (ROUTINE X 2)

## 2015-09-21 ENCOUNTER — Ambulatory Visit (INDEPENDENT_AMBULATORY_CARE_PROVIDER_SITE_OTHER): Payer: Medicare Other | Admitting: General Practice

## 2015-09-21 ENCOUNTER — Encounter: Payer: Self-pay | Admitting: Family Medicine

## 2015-09-21 ENCOUNTER — Ambulatory Visit (INDEPENDENT_AMBULATORY_CARE_PROVIDER_SITE_OTHER): Payer: Medicare Other | Admitting: Family Medicine

## 2015-09-21 ENCOUNTER — Telehealth: Payer: Self-pay | Admitting: *Deleted

## 2015-09-21 VITALS — BP 126/82 | HR 90 | Temp 98.1°F | Ht 71.0 in

## 2015-09-21 DIAGNOSIS — R531 Weakness: Secondary | ICD-10-CM | POA: Diagnosis not present

## 2015-09-21 DIAGNOSIS — I714 Abdominal aortic aneurysm, without rupture, unspecified: Secondary | ICD-10-CM

## 2015-09-21 DIAGNOSIS — R7881 Bacteremia: Secondary | ICD-10-CM | POA: Diagnosis not present

## 2015-09-21 DIAGNOSIS — Z5181 Encounter for therapeutic drug level monitoring: Secondary | ICD-10-CM | POA: Insufficient documentation

## 2015-09-21 DIAGNOSIS — R55 Syncope and collapse: Secondary | ICD-10-CM

## 2015-09-21 DIAGNOSIS — I10 Essential (primary) hypertension: Secondary | ICD-10-CM

## 2015-09-21 DIAGNOSIS — I639 Cerebral infarction, unspecified: Secondary | ICD-10-CM

## 2015-09-21 LAB — POCT INR: INR: 1.2

## 2015-09-21 NOTE — Progress Notes (Signed)
Pre visit review using our clinic review tool, if applicable. No additional management support is needed unless otherwise documented below in the visit note. 

## 2015-09-21 NOTE — Telephone Encounter (Signed)
Patient came into office to see Dr. Maudie Mercury. Before leaving office patient's INR was checked and INR was 1.2. Spoke with Villa Herb and she dosed patient up until appointment next Thursday, 09/29/15, to reasess levels. Patient's wife stated patient was seen in hospital and [place] to [facility] where they checked his coumadin. INR was 4.3 on 12/23 and they were advised to hold coumadin for two days and then resume. However, wife stated he has not been resumed on Coumadin since 12/23 and no one has managed his coumadin since then. Patient is now back home with wife and needing to establish with Coumadin Clinic during therapy. Villa Herb is aware and appointment has been made. Patient and wife verbalized understanding of dosage instructions and daily dosage schedule was given to patient prior to leaving office.

## 2015-09-21 NOTE — Addendum Note (Signed)
Addended by: Lucretia Kern on: 09/21/2015 02:36 PM   Modules accepted: Orders, Medications

## 2015-09-21 NOTE — Patient Instructions (Signed)
BEFORE YOU LEAVE: -please go to the lab for assistance with coumadin  Please follow up with Dr. Burnice Logan as planned  Please follow up with the infectious disease specialist and the neurologist per discharge instructions  Please continue physical therapy as planned

## 2015-09-21 NOTE — Addendum Note (Signed)
Addended by: Elmer Picker on: 09/21/2015 09:26 AM   Modules accepted: Orders

## 2015-09-21 NOTE — Progress Notes (Addendum)
HPI:  Brett Castaneda is a 74 yo patient of Dr. Burnice Logan with a complicated PMH sig for RA, CVA, HTN and HLD here for a visit prior to his hospital follow up with his PCP to assist with coumadin management questions. Per discharge and SNF discharge notes he was hospitalized 12/2 - 08/31/15 with subsequent nursing home stay following a syncopal episode. He had a fever and one blood culture was positive for listeria. ID was consulted. He was on vanc/zosyn, then ampicillin for 2 weeks. Other site blood cultures that day and cultures drawn two days later were negative. Per notes he was supposed to follow up with ID and have repeat cxs after finishing abx. Methotrexate was held while he was on abx.TEE showed a no endocarditis, but a large mobile plaque in the aortic arch. He had a loop recorder placement recently in workup for CVA. Coumadin with asa bridge were advised by the stroke team MD per notes with continuation of the coumadin for at least 3 month. He has follow up with neurologist. In evaluation of the syncope he had EKG and telemetry neg, neg orthostatics, loop recorder interrogation and TTE all ok. He sees cardiologist for wound checks. Patient and wife report he came home on 12/23 and has been doing reasonably well. Still feels weak and is working with PT at home - has orders from [facility] from eval yesterday to sign. He wants to check for any persistent infection - no symptoms to suggest this but he reports cultures not rechecked.  Wife reports was taking coumadin, then had INR 4.3 on 12/23 and coumadin held since then. Still on asa. Denies: bleeding, fevers, chills, cough, further syncope, palpitations. Wife reports they have appt with neurologist and he is also to see ID and cardiologist.  Addendum: received records. INR 1.7 12/16 and coumadin increased from 5 to 6 mg. Then INR 4.3 of 12.23 and order to hold x2 doses then resume. Pt held until today. Blood cxs neg from 12.22. Basic labs done at SNF  renal function improved and with anemia improved and no leukocytosis on 12/20. ROS: See pertinent positives and negatives per HPI.  Past Medical History  Diagnosis Date  . BASAL CELL CARCINOMA, FACE 03/31/2009  . 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 CONSTIPATION 06/22/2008  . DIVERTICULOSIS, COLON 03/24/2007  . GERD 03/24/2007  . HYPERLIPIDEMIA 08/27/2007  . HYPERTENSION 12/14/2008  . Rheumatoid arthritis(714.0) 03/24/2007  . Blind right eye   . Retinal detachment     hx of  . Keratosis Oct. 2013  . Fall at home Nov. 5, 2014  06-28-13    in the home and Outside as well  . 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 Stroke [facility]) Oct 2014  . 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 Total knee arthroplasty      bital  . Carpal tunnel release Right 2003  . Cataract extraction  1980    removed cataract in left eye. Currently blind in right eye  . Cholecystectomy  2001    Gall Bladder  . Retinal detachment surgery  2004  . Lumbar puncture  Oct. 21, 2014  . Nerve conduction    . Mri  07-14-13  . Carotid endarterectomy Right 08-15-07    cea  . Joint replacement  2000    Right knee replacement  . Eye surgery  [id number]    retinal detachment  . Eye surgery      left cataract surgery  . Shoulder surgery Left 2009    hx "frozen" shoulder  . Ep implantable device N/A 08/25/2015    Procedure: Loop Recorder Insertion;  Surgeon: Will Meredith Leeds, MD;  Location: [place] [facility];  Service: Cardiovascular;  Laterality: N/A;  . Tee without cardioversion N/A 08/30/2015    Procedure: TRANSESOPHAGEAL ECHOCARDIOGRAM (TEE);  Surgeon: Larey Dresser, MD;  Location: [facility];  Service: Cardiovascular;  Laterality: N/A;    Family History  Problem Relation Age of Onset  . Stroke Mother 53  . Hypertension Mother    . Heart disease Father   . Aneurysm Father   . Coronary artery disease Sister   . Pneumonia Sister   . Heart disease Sister   . Diabetes Sister   . Hypertension Sister   . Colon cancer Neg Hx     Social History   Social History  . Marital Status: Married    Spouse Name: [name]  . Number of Children: 4  . Years of Education: N/A   Occupational History  . retired    Social History Main Topics  . Smoking status: Former Smoker -- 23 years    Types: Cigarettes    Quit date: 09/24/1980  . Smokeless tobacco: Never Used  . Alcohol Use: No  . Drug Use: No  . Sexual Activity: Not Asked   Other Topics Concern  . None   Social History Narrative   Lives with wife of 62 years.  They have four children.  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      Current outpatient prescriptions:  .  acetaminophen (TYLENOL) 500 MG tablet, Take 500 mg by mouth every 6 (six) hours as needed for mild pain, moderate pain or fever. , Disp: , Rfl:  .  alendronate (FOSAMAX) 70 MG tablet, Take 70 mg by mouth once a week. Take with a full glass of water on an empty stomach. Takes on Wednesday, Disp: , Rfl:  .  amLODipine (NORVASC) 5 MG tablet, Take 1 tablet (5 mg total) by mouth daily., Disp: 30 tablet, Rfl: 0 .  folic acid-pyridoxine-cyancobalamin (VIRT-VITE FORTE) 2.5-25-2 MG TABS, Take 1 tablet by mouth once a week. Takes on days he takes methotrexate. (Saturdays), Disp: , Rfl:  .  latanoprost (XALATAN) 0.005 % ophthalmic solution, Place 1 drop into the left eye at bedtime. , Disp: , Rfl:  .  meclizine (ANTIVERT) 50 MG tablet, Take 1 tablet (50 mg total) by mouth 3 (three) times daily as needed., Disp: 30 tablet, Rfl: 0 .  methotrexate (RHEUMATREX) 2.5 MG tablet, Take 4 tablets (10 mg total) by mouth once a week. Resume once patient has completed IV antibiotics., Disp: , Rfl:  .  Multiple Vitamins-Minerals (OCUVITE ADULT 50+) CAPS, Take 1 capsule by mouth daily.,  Disp: , Rfl:  .  polyethylene glycol (MIRALAX / GLYCOLAX) packet, Take 17 g by mouth daily as needed (for constipation)., Disp: , Rfl:  .  simvastatin (ZOCOR) 40 MG tablet, Take 1 tablet (40 mg total) by mouth daily at 6 PM., Disp: 30 tablet, Rfl: 0 .  sodium chloride (OCEAN) 0.65 % SOLN nasal spray, Place 1 spray into both nostrils 3 (three) times daily., Disp: , Rfl:  .  tamsulosin (FLOMAX) 0.4 MG CAPS capsule, Take 0.4 mg by mouth daily.,  Disp: , Rfl: 0 .  timolol (TIMOPTIC) 0.5 % ophthalmic solution, Place 1 drop into the left eye every evening. , Disp: , Rfl:  .  warfarin (COUMADIN) 6 MG tablet, , Disp: , Rfl:   EXAM:  Filed Vitals:   09/21/15 0818  BP: 126/82  Pulse: 90  Temp: 98.1 F (36.7 C)    There is no weight on file to calculate BMI.  GENERAL: vitals reviewed and listed above, alert, oriented, appears well hydrated and in no acute distress  HEENT: atraumatic, conjunttiva clear, no obvious abnormalities on inspection of external nose and ears  NECK: no obvious masses on inspection  LUNGS: clear to auscultation bilaterally, no wheezes, rales or rhonchi, good air movement  CV: HRRR, no peripheral edema  MS: moves all extremities without noticeable abnormality  PSYCH: pleasant and cooperative, no obvious depression or anxiety  ASSESSMENT AND PLAN:  Discussed the following assessment and plan:  Bacteremia - Plan: CANCELED: Culture, blood (single), CANCELED: Culture, blood (single)  Weakness  Syncope, unspecified syncope type  Acute ischemic stroke (HCC)  AAA (abdominal aortic aneurysm) without rupture ([place]) - Plan: POCT INR  Essential hypertension  -RN to assist with INR check for coumadin management and establishment with coumadin clinic here today   -I am not familiar with asa being used as a bridge for coumadin initiation and given INR was supratherapeutic recently feel this should be stopped regardless as is a sig bleeding risk with this combo - unsure  why asa continued and why used as a bridge - will send note to neurologist in case they feel otherwise -assistant to obtain records from nursing home - see addendum -has hospital follow up with PCP 1/10 -follow up with neuro, ID, cards as planned -signed and had assistant fax orders for continued PT -Patient advised to return or notify a doctor immediately if symptoms worsen or persist or new concerns arise.  Patient Instructions  BEFORE YOU LEAVE: -please go to the lab for assistance with coumadin  Please follow up with Dr. Burnice Logan as planned  Please follow up with the infectious disease specialist and the neurologist per discharge instructions  Please continue physical therapy as planned      Colin Benton R.

## 2015-09-21 NOTE — Addendum Note (Signed)
Addended by: Elmer Picker on: 09/21/2015 09:16 AM   Modules accepted: Orders

## 2015-09-22 DIAGNOSIS — R55 Syncope and collapse: Secondary | ICD-10-CM | POA: Diagnosis not present

## 2015-09-22 DIAGNOSIS — R531 Weakness: Secondary | ICD-10-CM | POA: Diagnosis not present

## 2015-09-22 DIAGNOSIS — H5411 Blindness, right eye, low vision left eye: Secondary | ICD-10-CM | POA: Diagnosis not present

## 2015-09-22 DIAGNOSIS — G1221 Amyotrophic lateral sclerosis: Secondary | ICD-10-CM | POA: Diagnosis not present

## 2015-09-23 ENCOUNTER — Telehealth: Payer: Self-pay | Admitting: Internal Medicine

## 2015-09-23 ENCOUNTER — Inpatient Hospital Stay (HOSPITAL_COMMUNITY)
Admission: EM | Admit: 2015-09-23 | Discharge: 2015-09-26 | DRG: 864 | Disposition: A | Discharge status: Skilled Nursing Facility | Payer: Medicare Other | Attending: Internal Medicine | Admitting: Internal Medicine

## 2015-09-23 ENCOUNTER — Encounter (HOSPITAL_COMMUNITY): Payer: Self-pay

## 2015-09-23 ENCOUNTER — Observation Stay (HOSPITAL_COMMUNITY): Payer: Medicare Other

## 2015-09-23 ENCOUNTER — Emergency Department (HOSPITAL_COMMUNITY): Payer: Medicare Other

## 2015-09-23 ENCOUNTER — Encounter: Payer: Self-pay | Admitting: Cardiology

## 2015-09-23 DIAGNOSIS — G1221 Amyotrophic lateral sclerosis: Secondary | ICD-10-CM | POA: Diagnosis not present

## 2015-09-23 DIAGNOSIS — Z87891 Personal history of nicotine dependence: Secondary | ICD-10-CM

## 2015-09-23 DIAGNOSIS — I6200 Nontraumatic subdural hemorrhage, unspecified: Secondary | ICD-10-CM | POA: Diagnosis not present

## 2015-09-23 DIAGNOSIS — G9340 Encephalopathy, unspecified: Secondary | ICD-10-CM

## 2015-09-23 DIAGNOSIS — Z885 Allergy status to narcotic agent status: Secondary | ICD-10-CM

## 2015-09-23 DIAGNOSIS — M069 Rheumatoid arthritis, unspecified: Secondary | ICD-10-CM | POA: Diagnosis not present

## 2015-09-23 DIAGNOSIS — Z85828 Personal history of other malignant neoplasm of skin: Secondary | ICD-10-CM | POA: Diagnosis not present

## 2015-09-23 DIAGNOSIS — I10 Essential (primary) hypertension: Secondary | ICD-10-CM | POA: Diagnosis present

## 2015-09-23 DIAGNOSIS — Z7901 Long term (current) use of anticoagulants: Secondary | ICD-10-CM | POA: Diagnosis not present

## 2015-09-23 DIAGNOSIS — H3321 Serous retinal detachment, right eye: Secondary | ICD-10-CM | POA: Diagnosis not present

## 2015-09-23 DIAGNOSIS — R410 Disorientation, unspecified: Secondary | ICD-10-CM

## 2015-09-23 DIAGNOSIS — Z823 Family history of stroke: Secondary | ICD-10-CM

## 2015-09-23 DIAGNOSIS — R509 Fever, unspecified: Principal | ICD-10-CM | POA: Diagnosis present

## 2015-09-23 DIAGNOSIS — F0390 Unspecified dementia without behavioral disturbance: Secondary | ICD-10-CM | POA: Diagnosis present

## 2015-09-23 DIAGNOSIS — H5441 Blindness, right eye, normal vision left eye: Secondary | ICD-10-CM | POA: Diagnosis not present

## 2015-09-23 DIAGNOSIS — Z8249 Family history of ischemic heart disease and other diseases of the circulatory system: Secondary | ICD-10-CM | POA: Diagnosis not present

## 2015-09-23 DIAGNOSIS — R404 Transient alteration of awareness: Secondary | ICD-10-CM | POA: Diagnosis present

## 2015-09-23 DIAGNOSIS — R531 Weakness: Secondary | ICD-10-CM | POA: Diagnosis not present

## 2015-09-23 DIAGNOSIS — N400 Enlarged prostate without lower urinary tract symptoms: Secondary | ICD-10-CM | POA: Diagnosis present

## 2015-09-23 DIAGNOSIS — Z8673 Personal history of transient ischemic attack (TIA), and cerebral infarction without residual deficits: Secondary | ICD-10-CM

## 2015-09-23 DIAGNOSIS — R059 Cough, unspecified: Secondary | ICD-10-CM

## 2015-09-23 DIAGNOSIS — Z79899 Other long term (current) drug therapy: Secondary | ICD-10-CM

## 2015-09-23 DIAGNOSIS — I6203 Nontraumatic chronic subdural hemorrhage: Secondary | ICD-10-CM | POA: Diagnosis not present

## 2015-09-23 DIAGNOSIS — Z96651 Presence of right artificial knee joint: Secondary | ICD-10-CM | POA: Diagnosis not present

## 2015-09-23 DIAGNOSIS — K219 Gastro-esophageal reflux disease without esophagitis: Secondary | ICD-10-CM | POA: Diagnosis not present

## 2015-09-23 DIAGNOSIS — R42 Dizziness and giddiness: Secondary | ICD-10-CM

## 2015-09-23 DIAGNOSIS — E785 Hyperlipidemia, unspecified: Secondary | ICD-10-CM | POA: Diagnosis not present

## 2015-09-23 DIAGNOSIS — R05 Cough: Secondary | ICD-10-CM

## 2015-09-23 LAB — COMPREHENSIVE METABOLIC PANEL
ALBUMIN: 2.7 g/dL — AB (ref 3.5–5.0)
ALK PHOS: 61 U/L (ref 38–126)
ALT: 14 U/L — ABNORMAL LOW (ref 17–63)
ANION GAP: 8 (ref 5–15)
AST: 20 U/L (ref 15–41)
BILIRUBIN TOTAL: 0.8 mg/dL (ref 0.3–1.2)
BUN: 11 mg/dL (ref 6–20)
CO2: 22 mmol/L (ref 22–32)
Calcium: 8 mg/dL — ABNORMAL LOW (ref 8.9–10.3)
Chloride: 103 mmol/L (ref 101–111)
Creatinine, Ser: 1.23 mg/dL (ref 0.61–1.24)
GFR calc Af Amer: >60 mL/min (ref >60)
GFR calc non Af Amer: 57 mL/min — ABNORMAL LOW (ref >60)
GLUCOSE: 119 mg/dL — AB (ref 65–99)
POTASSIUM: 3.9 mmol/L (ref 3.5–5.1)
SODIUM: 133 mmol/L — AB (ref 135–145)
TOTAL PROTEIN: 6.8 g/dL (ref 6.5–8.1)

## 2015-09-23 LAB — CBC WITH DIFFERENTIAL/PLATELET
Basophils Absolute: 0 10*3/uL (ref 0.0–0.1)
Basophils Relative: 0 %
Eosinophils Absolute: 0.1 10*3/uL (ref 0.0–0.7)
Eosinophils Relative: 1 %
HEMATOCRIT: 32.4 % — AB (ref 39.0–52.0)
HEMOGLOBIN: 10.7 g/dL — AB (ref 13.0–17.0)
LYMPHS ABS: 2.2 10*3/uL (ref 0.7–4.0)
LYMPHS PCT: 21 %
MCH: 30.1 pg (ref 26.0–34.0)
MCHC: 33 g/dL (ref 30.0–36.0)
MCV: 91 fL (ref 78.0–100.0)
MONO ABS: 1.3 10*3/uL — AB (ref 0.1–1.0)
MONOS PCT: 12 %
NEUTROS ABS: 7.1 10*3/uL (ref 1.7–7.7)
NEUTROS PCT: 66 %
Platelets: 235 10*3/uL (ref 150–400)
RBC: 3.56 MIL/uL — ABNORMAL LOW (ref 4.22–5.81)
RDW: 13.5 % (ref 11.5–15.5)
WBC: 10.6 10*3/uL — ABNORMAL HIGH (ref 4.0–10.5)

## 2015-09-23 LAB — URINALYSIS, ROUTINE W REFLEX MICROSCOPIC
Bilirubin Urine: NEGATIVE
GLUCOSE, UA: NEGATIVE mg/dL
Ketones, ur: NEGATIVE mg/dL
LEUKOCYTES UA: NEGATIVE
Nitrite: NEGATIVE
PH: 7 (ref 5.0–8.0)
PROTEIN: NEGATIVE mg/dL
Specific Gravity, Urine: 1.006 (ref 1.005–1.030)

## 2015-09-23 LAB — URINE MICROSCOPIC-ADD ON
Squamous Epithelial / LPF: NONE SEEN
WBC UA: NONE SEEN WBC/hpf (ref 0–5)

## 2015-09-23 LAB — CG4 I-STAT (LACTIC ACID): Lactic Acid, Venous: 0.9 mmol/L (ref 0.5–2.0)

## 2015-09-23 LAB — I-STAT CG4 LACTIC ACID, ED: Lactic Acid, Venous: 0.66 mmol/L (ref 0.5–2.0)

## 2015-09-23 LAB — PROTIME-INR
INR: 2.3 — ABNORMAL HIGH (ref 0.00–1.49)
Prothrombin Time: 25.1 seconds — ABNORMAL HIGH (ref 11.6–15.2)

## 2015-09-23 MED ORDER — PIPERACILLIN-TAZOBACTAM 3.375 G IVPB 30 MIN
3.3750 g | Freq: Once | INTRAVENOUS | Status: AC
  Administered 2015-09-23: 3.375 g via INTRAVENOUS
  Filled 2015-09-23: qty 50

## 2015-09-23 MED ORDER — AMLODIPINE BESYLATE 5 MG PO TABS
5.0000 mg | ORAL_TABLET | Freq: Every day | ORAL | Status: DC
  Administered 2015-09-23 – 2015-09-26 (×4): 5 mg via ORAL
  Filled 2015-09-23 (×4): qty 1

## 2015-09-23 MED ORDER — LATANOPROST 0.005 % OP SOLN
1.0000 [drp] | Freq: Every day | OPHTHALMIC | Status: DC
  Administered 2015-09-23 – 2015-09-25 (×3): 1 [drp] via OPHTHALMIC
  Filled 2015-09-23 (×2): qty 2.5

## 2015-09-23 MED ORDER — ENSURE ENLIVE PO LIQD
237.0000 mL | Freq: Two times a day (BID) | ORAL | Status: DC
  Administered 2015-09-24: 237 mL via ORAL

## 2015-09-23 MED ORDER — VANCOMYCIN HCL IN DEXTROSE 1-5 GM/200ML-% IV SOLN: 1000.0000 mg | Freq: Once | INTRAVENOUS | Status: DC

## 2015-09-23 MED ORDER — VANCOMYCIN HCL 10 G IV SOLR
1500.0000 mg | Freq: Once | INTRAVENOUS | Status: AC
  Administered 2015-09-23: 1500 mg via INTRAVENOUS
  Filled 2015-09-23: qty 1500

## 2015-09-23 MED ORDER — ONDANSETRON HCL 4 MG/2ML IJ SOLN: 4.0000 mg | Freq: Four times a day (QID) | INTRAMUSCULAR | Status: DC | PRN

## 2015-09-23 MED ORDER — SODIUM CHLORIDE 0.9 % IV BOLUS (SEPSIS)
1000.0000 mL | INTRAVENOUS | Status: AC
  Administered 2015-09-23 (×2): 1000 mL via INTRAVENOUS

## 2015-09-23 MED ORDER — ACETAMINOPHEN 500 MG PO TABS
500.0000 mg | ORAL_TABLET | Freq: Four times a day (QID) | ORAL | Status: DC | PRN
  Administered 2015-09-23: 500 mg via ORAL
  Filled 2015-09-23: qty 1

## 2015-09-23 MED ORDER — ATORVASTATIN CALCIUM 20 MG PO TABS
20.0000 mg | ORAL_TABLET | Freq: Every day | ORAL | Status: DC
  Administered 2015-09-23 – 2015-09-25 (×3): 20 mg via ORAL
  Filled 2015-09-23 (×3): qty 1

## 2015-09-23 MED ORDER — SIMVASTATIN 40 MG PO TABS
40.0000 mg | ORAL_TABLET | Freq: Every day | ORAL | Status: DC
  Filled 2015-09-23: qty 1

## 2015-09-23 MED ORDER — POLYETHYLENE GLYCOL 3350 17 G PO PACK: 17.0000 g | PACK | Freq: Every day | ORAL | Status: DC | PRN

## 2015-09-23 MED ORDER — SODIUM CHLORIDE 0.9 % IV BOLUS (SEPSIS)
500.0000 mL | INTRAVENOUS | Status: AC
  Administered 2015-09-23: 500 mL via INTRAVENOUS

## 2015-09-23 MED ORDER — TAMSULOSIN HCL 0.4 MG PO CAPS
0.4000 mg | ORAL_CAPSULE | Freq: Every day | ORAL | Status: DC
  Administered 2015-09-23 – 2015-09-26 (×4): 0.4 mg via ORAL
  Filled 2015-09-23 (×4): qty 1

## 2015-09-23 MED ORDER — WARFARIN - PHARMACIST DOSING INPATIENT: Freq: Every day | Status: DC

## 2015-09-23 MED ORDER — SODIUM CHLORIDE 0.9 % IV SOLN
INTRAVENOUS | Status: DC
  Administered 2015-09-23: 14:00:00 via INTRAVENOUS

## 2015-09-23 MED ORDER — WARFARIN SODIUM 3 MG PO TABS
3.0000 mg | ORAL_TABLET | Freq: Once | ORAL | Status: DC
  Filled 2015-09-23: qty 1

## 2015-09-23 MED ORDER — VANCOMYCIN HCL IN DEXTROSE 750-5 MG/150ML-% IV SOLN
750.0000 mg | Freq: Two times a day (BID) | INTRAVENOUS | Status: DC
  Filled 2015-09-23: qty 150

## 2015-09-23 MED ORDER — PIPERACILLIN-TAZOBACTAM 3.375 G IVPB
3.3750 g | Freq: Three times a day (TID) | INTRAVENOUS | Status: DC
  Filled 2015-09-23 (×3): qty 50

## 2015-09-23 MED ORDER — ONDANSETRON HCL 4 MG PO TABS: 4.0000 mg | ORAL_TABLET | Freq: Four times a day (QID) | ORAL | Status: DC | PRN

## 2015-09-23 MED ORDER — FAMOTIDINE 20 MG PO TABS
20.0000 mg | ORAL_TABLET | Freq: Two times a day (BID) | ORAL | Status: DC | PRN
  Administered 2015-09-23 – 2015-09-24 (×2): 20 mg via ORAL
  Filled 2015-09-23 (×2): qty 1

## 2015-09-23 MED ORDER — TIMOLOL MALEATE 0.5 % OP SOLN
1.0000 [drp] | Freq: Every evening | OPHTHALMIC | Status: DC
  Administered 2015-09-23 – 2015-09-25 (×3): 1 [drp] via OPHTHALMIC
  Filled 2015-09-23: qty 5

## 2015-09-23 NOTE — Progress Notes (Addendum)
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 Called and discussed with neurosurgeon to [facility], who recommended no surgery at this time. He will see the patient. Discontinue Coumadin at this time.

## 2015-09-23 NOTE — H&P (Signed)
PCP:   Nyoka Cowden, MD   Chief Complaint:  Transient episode of altered mental status, weakness  HPI: 74 year old male who   has a past medical history of BASAL CELL CARCINOMA, FACE (03/31/2009); CEREBROVASCULAR ACCIDENT, HX OF (03/24/2007); CONSTIPATION (06/22/2008); DIVERTICULOSIS, COLON (03/24/2007); GERD (03/24/2007); HYPERLIPIDEMIA (08/27/2007); HYPERTENSION (12/14/2008); Rheumatoid arthritis(714.0) (03/24/2007); Blind right eye; Retinal detachment; Keratosis (Oct. 2013); Fall at home (Nov. 5, 2014  06-28-13); Carotid artery occlusion (08-15-07); Stroke 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 Patient was recently hospitalized from 08/26/15 - 12/7/-16  for dizziness and syncopal episode. During the hospitalization he was found to have blood cultures positive for Listeria, at that time he underwent TEE which was negative for endocarditis but showed large mobile atherosclerotic plaque in the aortic arch and subsequently started on Coumadin. Patient was discharged on 2 weeks of antibiotics, ampicillin and went to skilled nursing facility. Patient was discharged from the skilled facility on 09/16/2015 and went home. Patient has a loop recorder in place for the workup for syncope. For last 2 days patient has been complaining of more dizziness in mornings. Yesterday he had an episode where he got transiently confused and was unable to locate the side of the bed to get on the bed. As per wife patient's confusion improved in the evening. He also had more impaired coordination than usual. Patient is back to his baseline. He is alert and oriented 4. Denies any weakness at this time. In the ED he was found to have low-grade temp 100.4, and was empirically started on vancomycin and Zosyn by the ED physician due to concern for positive blood culture as listed in previous admission. Chest x-ray showed no infiltrate, UA was negative. Patient denies nausea vomiting  or diarrhea. No blurred vision. The he complains of dizziness and impaired coordination. Patient was started on meclizine 50 mg 3 times a day when necessary for dizziness and was given meclizine yesterday morning.  Allergies:   Allergies  Allergen Reactions  . Dopamine Anaphylaxis  . Morphine And Related Other (See Comments)    Hallucinations, very bad reactions  . Vicodin [Hydrocodone-Acetaminophen] Nausea And Vomiting      Past Medical History  Diagnosis Date  . BASAL CELL CARCINOMA, FACE 03/31/2009  . 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 CONSTIPATION 06/22/2008  . DIVERTICULOSIS, COLON 03/24/2007  . GERD 03/24/2007  . HYPERLIPIDEMIA 08/27/2007  . HYPERTENSION 12/14/2008  . Rheumatoid arthritis(714.0) 03/24/2007  . Blind right eye   . Retinal detachment     hx of  . Keratosis Oct. 2013  . Fall at home Nov. 5, 2014  06-28-13    in the home and Outside as well  . 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 Stroke [facility]) Oct 2014  . ALS (amyotrophic lateral sclerosis) ([place]) dx jan. 14, 2015    symptons consistent, 2nd opinion at [facility] at 11/09/13    Past Surgical History  Procedure Laterality Date  . Total knee arthroplasty      bital  . Carpal tunnel release Right 2003  . Cataract extraction  1980    removed cataract in left eye. Currently blind in right eye  . Cholecystectomy  2001    Gall Bladder  . Retinal detachment surgery  2004  . Lumbar puncture  Oct. 21, 2014  . Nerve conduction    . Mri  07-14-13  . Carotid endarterectomy Right 08-15-07  cea  . Joint replacement  2000    Right knee replacement  . Eye surgery  [id number]    retinal detachment  . Eye surgery      left cataract surgery  . Shoulder surgery Left 2009    hx "frozen" shoulder  . Ep implantable device N/A 08/25/2015    Procedure: Loop Recorder Insertion;  Surgeon: Will Meredith Leeds, MD;  Location: [place] [facility];  Service: Cardiovascular;   Laterality: N/A;  . Tee without cardioversion N/A 08/30/2015    Procedure: TRANSESOPHAGEAL ECHOCARDIOGRAM (TEE);  Surgeon: Larey Dresser, MD;  Location: [place];  Service: Cardiovascular;  Laterality: N/A;    Prior to Admission medications   Medication Sig Start Date End Date Taking? Authorizing Provider  acetaminophen (TYLENOL) 500 MG tablet Take 500 mg by mouth every 6 (six) hours as needed for mild pain, moderate pain or fever.    Yes Historical Provider, MD  alendronate (FOSAMAX) 70 MG tablet Take 70 mg by mouth once a week. Take with a full glass of water on an empty stomach. Takes on Wednesday   Yes Historical Provider, MD  amLODipine (NORVASC) 5 MG tablet Take 1 tablet (5 mg total) by mouth daily. 08/25/15  Yes Costin Karlyne Greenspan, MD  folic acid-pyridoxine-cyancobalamin (VIRT-VITE FORTE) 2.5-25-2 MG TABS Take 1 tablet by mouth once a week. Takes on days he takes methotrexate. (Saturdays)   Yes Historical Provider, MD  latanoprost (XALATAN) 0.005 % ophthalmic solution Place 1 drop into the left eye at bedtime.    Yes Historical Provider, MD  Multiple Vitamins-Minerals (OCUVITE ADULT 50+) CAPS Take 1 capsule by mouth daily.   Yes Historical Provider, MD  polyethylene glycol (MIRALAX / GLYCOLAX) packet Take 17 g by mouth daily as needed (for constipation).   Yes Historical Provider, MD  simvastatin (ZOCOR) 40 MG tablet Take 1 tablet (40 mg total) by mouth daily at 6 PM. 08/25/15  Yes Costin Karlyne Greenspan, MD  sodium chloride (OCEAN) 0.65 % SOLN nasal spray Place 1 spray into both nostrils 3 (three) times daily.   Yes Historical Provider, MD  tamsulosin (FLOMAX) 0.4 MG CAPS capsule Take 0.4 mg by mouth daily. 08/10/15  Yes Historical Provider, MD  timolol (TIMOPTIC) 0.5 % ophthalmic solution Place 1 drop into the left eye every evening.    Yes Historical Provider, MD  warfarin (COUMADIN) 6 MG tablet Take 3-6 mg by mouth daily.  09/18/15  Yes Historical Provider, MD  meclizine (ANTIVERT) 50 MG  tablet Take 1 tablet (50 mg total) by mouth 3 (three) times daily as needed. Patient not taking: Reported on 09/23/2015 08/26/15   Lahoma Crocker Barrett, PA-C  methotrexate (RHEUMATREX) 2.5 MG tablet Take 4 tablets (10 mg total) by mouth once a week. Resume once patient has completed IV antibiotics. 09/14/15   Shanker Kristeen Mans, MD    Social History:  reports that he quit smoking about 35 years ago. His smoking use included Cigarettes. He quit after 23 years of use. He has never used smokeless tobacco. He reports that he does not drink alcohol or use illicit drugs.  Family History  Problem Relation Age of Onset  . Stroke Mother 72  . Hypertension Mother   . Heart disease Father   . Aneurysm Father   . Coronary artery disease Sister   . Pneumonia Sister   . Heart disease Sister   . Diabetes Sister   . Hypertension Sister   . Colon cancer Neg Hx     [employer]  09/23/15 1101  Weight: 72.576 kg (160 lb)    All the positives are listed in BOLD  Review of Systems:  HEENT: Headache, blurred vision, runny nose, sore throat, dizziness Neck: Hypothyroidism, hyperthyroidism,,lymphadenopathy Chest : Shortness of breath, history of COPD, Asthma Heart : Chest pain, history of coronary arterey disease GI:  Nausea, vomiting, diarrhea, constipation, GERD GU: Dysuria, urgency, frequency of urination, hematuria Neuro: Stroke, seizures, syncope Psych: Depression, anxiety, hallucinations   Physical Exam: Blood pressure 129/82, pulse 91, temperature 99.4 F (37.4 C), temperature source Rectal, resp. rate 11, weight 72.576 kg (160 lb), SpO2 98 %. Constitutional:   Patient is a well-developed and well-nourished male* in no acute distress and cooperative with exam. Head: Normocephalic and atraumatic Mouth: Mucus membranes moist Eyes: PERRL, EOMI, conjunctivae normal Neck: Supple, No Thyromegaly Cardiovascular: RRR, S1 normal, S2 normal Pulmonary/Chest: CTAB, no wheezes, rales, or  rhonchi Abdominal: Soft. Non-tender, non-distended, bowel sounds are normal, no masses, organomegaly, or guarding present.  Neurological: A&O x3, Strength is normal and symmetric bilaterally, cranial nerve II-XII are grossly intact, no focal motor deficit, sensory intact to light touch bilaterally.  Extremities : No Cyanosis, Clubbing or Edema  Labs on Admission:  Basic Metabolic Panel:  Recent Labs Lab 09/23/15 0952  NA 133*  K 3.9  CL 103  CO2 22  GLUCOSE 119*  BUN 11  CREATININE 1.23  CALCIUM 8.0*   Liver Function Tests:  Recent Labs Lab 09/23/15 0952  AST 20  ALT 14*  ALKPHOS 61  BILITOT 0.8  PROT 6.8  ALBUMIN 2.7*    CBC:  Recent Labs Lab 09/23/15 0952  WBC 10.6*  NEUTROABS 7.1  HGB 10.7*  HCT 32.4*  MCV 91.0  PLT 235    Radiological Exams on Admission: Dg Chest 2 View  09/23/2015  CLINICAL DATA:  Fever.  Altered mental status. EXAM: CHEST  2 VIEW COMPARISON:  Chest x-rays dated 08/27/2015 and 08/19/2015 and chest CT dated 08/23/2015 FINDINGS: Heart size and pulmonary vascularity are normal. The patient has chronic interstitial lung disease with honeycombing at the bases as demonstrated on the prior CT scan. No acute infiltrates or effusions. Calcification and tortuosity of the thoracic aorta. Loop recorder in place. IMPRESSION: No acute abnormality. Chronic interstitial lung disease, most severe at the lung bases. Electronically Signed   By: Lorriane Shire M.D.   On: 09/23/2015 11:40    EKG: Independently reviewed. Normal sinus rhythm   Assessment/Plan Active Problems:   Essential hypertension   Weakness   Dizziness   Fever   Altered awareness, transient  Transient altered awareness  Patie nt had episode of transient confusion yesterday, is completely back to his baseline. Likely from meclizine 50 mg dose he received in a.m. patient may have had anticholinergic side effects from meclizine. Will discontinue meclizine. Will obtain CT head to rule  out underlying neurological abnormality. Unable to obtain MRI as patient already has a loop recorder.  Low-grade fever Patient was empirically started on vancomycin and Zosyn per ED physician. I checked with ID Dr. Talbot Grumbling who does not recommend any antibiotic at this time. Blood cultures 2 have been obtained. Will follow the blood cultures. UA is negative, chest x-ray shows no infiltrate. Follow WBC and a.m.   History of syncope   loop recorder in place, follow-up cardiology as outpatient.  Recent CVA Patient has mobile atherosclerotic plaque in the aortic arch Continue Coumadin, follow-up with neurology as outpatient.  Code status: Full code  Family discussion: Admission, patients condition and  plan of care including tests being ordered have been discussed with the patient and his wife and daughter at bedside* who indicate understanding and agree with the plan and Code Status.   Time Spent on Admission: 60 min  [facility] Pager: [phone] 09/23/2015, 1:41 PM  If 7PM-7AM, please contact night-coverage  [email or web address]  Password [email or web address]

## 2015-09-23 NOTE — ED Notes (Signed)
Pt brought in EMS for lethargy that began last night and increased today.  Pt is A&Ox3, pt is unsure of time/age.  Pt is c/o no pain or symptoms at this time pt sts "I'm just not feeling well."

## 2015-09-23 NOTE — Telephone Encounter (Signed)
[facility] - Client [facility] Patient Name: DARRILL LAZO DOB: 1941/02/09 Initial Comment Caller states her husband has a temp of 100.2. BP is 155/95. Pulse 106. Day after Thanksgiving he had a mini stroke. Came home the 29th, passed out in the shower, 29- Dec 2nd in hsp. She thinks he is having another stroke, confussed. Nurse Assessment Nurse: Markus Daft, RN, Sherre Poot Date/Time Eilene Ghazi Time): 09/23/2015 8:33:52 AM Confirm and document reason for call. If symptomatic, describe symptoms. ---Caller states her husband has a temp of 100.2 by mouth, BP is 155/95. Pulse 106 today. He is confused, and she thinks that he is having another stroke. - He was in the hospital with TIA on 08/19/15. Discharged home 08/23/15, passed out in the shower, and taken back that same day and kept in the hospital for several days. 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 He had been off Coumadin, but now he is back on it. Has the patient traveled out of the country within the last 30 days? ---Not Applicable Does the patient have any new or worsening symptoms? ---Yes Will a triage be completed? ---Yes Related visit to physician within the last 2 weeks? ---Yes Does the PT have any chronic conditions? (i.e. diabetes, asthma, etc.) ---Yes List chronic conditions. ---TIA, HTN, on Coumadin Is this a behavioral health or substance abuse call? ---No Guidelines Guideline Title Affirmed Question Affirmed Notes Confusion - Delirium [1] Difficult to awaken or acting confused (disoriented, slurred speech) AND [2] present now AND [3] new onset Final Disposition User Call EMS 911 Now [place], [place], [place] Disagree/Comply: Leta Baptist

## 2015-09-23 NOTE — Telephone Encounter (Signed)
Pt is currently admitted

## 2015-09-23 NOTE — ED Provider Notes (Signed)
CSN: [id number]     Arrival date & time 09/23/15  [id number] History   First MD Initiated Contact with Patient 09/23/15 [phone]     Chief Complaint  Patient presents with  . Fatigue  . Fever     (Consider location/radiation/quality/duration/timing/severity/associated sxs/prior Treatment) HPI Comments: 74 year old male with extensive past medical history including multiple CVAs, hypertension, hyperlipidemia, rheumatoid arthritis who presents with altered mentation. History obtained with the assistance of the patient's wife given the patient's altered mentation. She reports that yesterday evening he began acting lethargic and not well. They had a rough night overnight last night because he seemed to have balance problems and she would not let him get out of bed. His generalized malaise has been worse this morning and she noted a fever of 100.3. The patient has not had any cough/cold symptoms, vomiting, diarrhea, chest pain, shortness of breath, or abdominal pain. Currently he states that he does not feel well but denies any other complaints. He was hospitalized recently for stroke symptoms and at one point had a blood culture positive for listeria. He followed up with his PCP 2 days ago.  Patient is a 74 y.o. male presenting with fever. The history is provided by the spouse and a relative.  Fever   Past Medical History  Diagnosis Date  . BASAL CELL CARCINOMA, FACE 03/31/2009  . 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 CONSTIPATION 06/22/2008  . DIVERTICULOSIS, COLON 03/24/2007  . GERD 03/24/2007  . HYPERLIPIDEMIA 08/27/2007  . HYPERTENSION 12/14/2008  . Rheumatoid arthritis(714.0) 03/24/2007  . Blind right eye   . Retinal detachment     hx of  . Keratosis Oct. 2013  . Fall at home Nov. 5, 2014  06-28-13    in the home and Outside as well  . 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 Stroke [facility]) Oct 2014  . ALS (amyotrophic lateral sclerosis) ([place]) dx jan.  14, 2015    symptons consistent, 2nd opinion at [facility] at 11/09/13   Past Surgical History  Procedure Laterality Date  . Total knee arthroplasty      bital  . Carpal tunnel release Right 2003  . Cataract extraction  1980    removed cataract in left eye. Currently blind in right eye  . Cholecystectomy  2001    Gall Bladder  . Retinal detachment surgery  2004  . Lumbar puncture  Oct. 21, 2014  . Nerve conduction    . Mri  07-14-13  . Carotid endarterectomy Right 08-15-07    cea  . Joint replacement  2000    Right knee replacement  . Eye surgery  [id number]    retinal detachment  . Eye surgery      left cataract surgery  . Shoulder surgery Left 2009    hx "frozen" shoulder  . Ep implantable device N/A 08/25/2015    Procedure: Loop Recorder Insertion;  Surgeon: Will Meredith Leeds, MD;  Location: [place] [facility];  Service: Cardiovascular;  Laterality: N/A;  . Tee without cardioversion N/A 08/30/2015    Procedure: TRANSESOPHAGEAL ECHOCARDIOGRAM (TEE);  Surgeon: Larey Dresser, MD;  Location: [facility] ENDOSCOPY;  Service: Cardiovascular;  Laterality: N/A;   Family History  Problem Relation Age of Onset  . Stroke Mother 52  . Hypertension Mother   . Heart disease Father   . Aneurysm Father   . Coronary artery disease Sister   . Pneumonia Sister   . Heart disease  Sister   . Diabetes Sister   . Hypertension Sister   . Colon cancer Neg Hx    Social History  Substance Use Topics  . Smoking status: Former Smoker -- 23 years    Types: Cigarettes    Quit date: 09/24/1980  . Smokeless tobacco: Never Used  . Alcohol Use: No    Review of Systems  Unable to perform ROS: Mental status change  Constitutional: Positive for fever.      Allergies  Dopamine; Morphine and related; and Vicodin  Home Medications   Prior to Admission medications   Medication Sig Start Date End Date Taking? Authorizing Provider  acetaminophen (TYLENOL) 500 MG tablet Take 500 mg by mouth every 6 (six)  hours as needed for mild pain, moderate pain or fever.     Historical Provider, MD  alendronate (FOSAMAX) 70 MG tablet Take 70 mg by mouth once a week. Take with a full glass of water on an empty stomach. Takes on Wednesday    Historical Provider, MD  amLODipine (NORVASC) 5 MG tablet Take 1 tablet (5 mg total) by mouth daily. 08/25/15   Costin Karlyne Greenspan, MD  folic acid-pyridoxine-cyancobalamin (VIRT-VITE FORTE) 2.5-25-2 MG TABS Take 1 tablet by mouth once a week. Takes on days he takes methotrexate. (Saturdays)    Historical Provider, MD  latanoprost (XALATAN) 0.005 % ophthalmic solution Place 1 drop into the left eye at bedtime.     Historical Provider, MD  meclizine (ANTIVERT) 50 MG tablet Take 1 tablet (50 mg total) by mouth 3 (three) times daily as needed. 08/26/15   Stevi Barrett, PA-C  methotrexate (RHEUMATREX) 2.5 MG tablet Take 4 tablets (10 mg total) by mouth once a week. Resume once patient has completed IV antibiotics. 09/14/15   Shanker Kristeen Mans, MD  Multiple Vitamins-Minerals (OCUVITE ADULT 50+) CAPS Take 1 capsule by mouth daily.    Historical Provider, MD  polyethylene glycol (MIRALAX / GLYCOLAX) packet Take 17 g by mouth daily as needed (for constipation).    Historical Provider, MD  simvastatin (ZOCOR) 40 MG tablet Take 1 tablet (40 mg total) by mouth daily at 6 PM. 08/25/15   Costin Karlyne Greenspan, MD  sodium chloride (OCEAN) 0.65 % SOLN nasal spray Place 1 spray into both nostrils 3 (three) times daily.    Historical Provider, MD  tamsulosin (FLOMAX) 0.4 MG CAPS capsule Take 0.4 mg by mouth daily. 08/10/15   Historical Provider, MD  timolol (TIMOPTIC) 0.5 % ophthalmic solution Place 1 drop into the left eye every evening.     Historical Provider, MD  warfarin (COUMADIN) 6 MG tablet  09/18/15   Historical Provider, MD   BP 170/96 mmHg  Pulse 99  Temp(Src) 98.8 F (37.1 C) (Oral)  Resp 18  SpO2 100% Physical Exam  Constitutional:  Awake, frail and ill appearing but non-toxic, NAD   HENT:  Head: Normocephalic and atraumatic.  Dry mucous membranes  Eyes: Conjunctivae and EOM are normal.  R eye blindness, L pupil reactive to light  Neck: Neck supple.  Cardiovascular: Normal rate, regular rhythm and normal heart sounds.   No murmur heard. Pulmonary/Chest: Effort normal. No respiratory distress.  Coarse BS L lower lung  Abdominal: Soft. Bowel sounds are normal. He exhibits no distension.  Musculoskeletal: He exhibits no edema.  Neurological: He is alert. He has normal reflexes. No cranial nerve deficit.  Oriented to person, able to follow basic commands; 4/5 strength BUE, LLE, 3/5 strength RLE (chronic)  Skin: Skin is warm and  dry. No rash noted.  Psychiatric: Thought content normal.  Nursing note and vitals reviewed.   ED Course  Procedures (including critical care time)  CRITICAL CARE Performed by: Wenda Overland Keon Pender   Total critical care time: 35 minutes  Critical care time was exclusive of separately billable procedures and treating other patients.  Critical care was necessary to treat or prevent imminent or life-threatening deterioration.  Critical care was time spent personally by me on the following activities: development of treatment plan with patient and/or surrogate as well as nursing, discussions with consultants, evaluation of patient's response to treatment, examination of patient, obtaining history from patient or surrogate, ordering and performing treatments and interventions, ordering and review of laboratory studies, ordering and review of radiographic studies, pulse oximetry and re-evaluation of patient's condition.  Labs Review Labs Reviewed  CBC WITH DIFFERENTIAL/PLATELET - Abnormal; Notable for the following:    WBC 10.6 (*)    RBC 3.56 (*)    Hemoglobin 10.7 (*)    HCT 32.4 (*)    Monocytes Absolute 1.3 (*)    All other components within normal limits  URINE CULTURE  CULTURE, BLOOD (ROUTINE X 2)  CULTURE, BLOOD (ROUTINE X 2)   COMPREHENSIVE METABOLIC PANEL  URINALYSIS, ROUTINE W REFLEX MICROSCOPIC (NOT AT [facility])  PROTIME-INR  I-STAT CG4 LACTIC ACID, ED    Imaging Review  I have personally reviewed and evaluated these lab results as part of my medical decision-making.   EKG Interpretation   Date/Time:  Friday September 23 2015 09:43:22 EST Ventricular Rate:  98 PR Interval:  166 QRS Duration: 97 QT Interval:  333 QTC Calculation: 425 R Axis:   -9 Text Interpretation:  Sinus rhythm Atrial premature complex No significant  change since last tracing Confirmed by Vincent Streater MD, Moo Gravley [phone]) on  09/23/2015 9:49:30 AM     Medications  sodium chloride 0.9 % bolus 1,000 mL (not administered)    Followed by  sodium chloride 0.9 % bolus 500 mL (not administered)  piperacillin-tazobactam (ZOSYN) IVPB 3.375 g (not administered)  vancomycin (VANCOCIN) 1,500 mg in sodium chloride 0.9 % 500 mL IVPB (not administered)  vancomycin (VANCOCIN) IVPB 750 mg/150 ml premix (not administered)  piperacillin-tazobactam (ZOSYN) IVPB 3.375 g (not administered)    MDM   Final diagnoses:  Fever, unspecified fever cause  Confusion   Patient with recent positive blood cultures for listeria presents with lethargy and generalized malaise since last night. He had a fever this morning. On arrival to the ED, the patient was frail and ill-appearing but nontoxic and in no acute distress. Vital signs stable. Given recent bacteremia and fever at home, initiated code sepsis w/ above labs, cultures, IVFs and Vanc/zosyn.   EKG unremarkable. CXR negative acute. LAbs show normal lactate, WBC 10.6, negative UA. INR therapeutic at 2.3. The patient remains stable on re-evaluation. Discussed w/ family that pt is high risk for ischemic stroke given hx of stroke, however unable to obtain MRI given his loop recorder. Discussed admission for work up of fever and AMS w/ Dr. Darrick Meigs, appreciate assistance. Pt admitted to medicine for further  care.   Sharlett Iles, MD 09/24/15 [phone]

## 2015-09-23 NOTE — Progress Notes (Signed)
Received from ED 74 YO male

## 2015-09-23 NOTE — Progress Notes (Signed)
Dr Darrick Meigs spoke to wife for update.

## 2015-09-23 NOTE — Progress Notes (Signed)
ANTICOAGULATION CONSULT NOTE - Initial Consult  Pharmacy Consult for warfarin Indication: aortic arch plaque  Allergies  Allergen Reactions  . Dopamine Anaphylaxis  . Morphine And Related Other (See Comments)    Hallucinations, very bad reactions  . Vicodin [Hydrocodone-Acetaminophen] Nausea And Vomiting    Patient Measurements: Height: 5\' 11"  (180.3 cm) Weight: 167 lb 8 oz (75.978 kg) IBW/kg (Calculated) : 75.3  Vital Signs: Temp: 98.2 F (36.8 C) (12/30 1410) Temp Source: Oral (12/30 1410) BP: 156/133 mmHg (12/30 1410) Pulse Rate: 113 (12/30 1410)  Labs:  Recent Labs  09/21/15 0922 09/23/15 0947 09/23/15 0952  HGB  --   --  10.7*  HCT  --   --  32.4*  PLT  --   --  235  LABPROT  --  25.1*  --   INR 1.2 2.30*  --   CREATININE  --   --  1.23    Estimated Creatinine Clearance: 57.8 mL/min (by C-G formula based on Cr of 1.23).   Medical History: Past Medical History  Diagnosis Date  . BASAL CELL CARCINOMA, FACE 03/31/2009  . 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 CONSTIPATION 06/22/2008  . DIVERTICULOSIS, COLON 03/24/2007  . GERD 03/24/2007  . HYPERLIPIDEMIA 08/27/2007  . HYPERTENSION 12/14/2008  . Rheumatoid arthritis(714.0) 03/24/2007  . Blind right eye   . Retinal detachment     hx of  . Keratosis Oct. 2013  . Fall at home Nov. 5, 2014  06-28-13    in the home and Outside as well  . 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 Stroke [facility]) Oct 2014  . 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 at 11/09/13    Medications:  Prescriptions prior to admission  Medication Sig Dispense Refill Last Dose  . acetaminophen (TYLENOL) 500 MG tablet Take 500 mg by mouth every 6 (six) hours as needed for mild pain, moderate pain or fever.    09/23/2015 at Unknown time  . alendronate (FOSAMAX) 70 MG tablet Take 70 mg by mouth once a week. Take with a full glass of  water on an empty stomach. Takes on Wednesday   Past Week at Unknown time  . amLODipine (NORVASC) 5 MG tablet Take 1 tablet (5 mg total) by mouth daily. 30 tablet 0 09/22/2015 at Unknown time  . folic acid-pyridoxine-cyancobalamin (VIRT-VITE FORTE) 2.5-25-2 MG TABS Take 1 tablet by mouth once a week. Takes on days he takes methotrexate. (Saturdays)   Past Week at Unknown time  . latanoprost (XALATAN) 0.005 % ophthalmic solution Place 1 drop into the left eye at bedtime.    09/22/2015 at Unknown time  . Multiple Vitamins-Minerals (OCUVITE ADULT 50+) CAPS Take 1 capsule by mouth daily.   09/22/2015 at Unknown time  . polyethylene glycol (MIRALAX / GLYCOLAX) packet Take 17 g by mouth daily as needed (for constipation).   Past Week at Unknown time  . simvastatin (ZOCOR) 40 MG tablet Take 1 tablet (40 mg total) by mouth daily at 6 PM. 30 tablet 0 09/22/2015 at Unknown time  . sodium chloride (OCEAN) 0.65 % SOLN nasal spray Place 1 spray into both nostrils 3 (three) times daily.   09/22/2015 at Unknown time  . tamsulosin (FLOMAX) 0.4 MG CAPS capsule Take 0.4 mg by mouth daily.  0 09/22/2015 at Unknown time  . timolol (TIMOPTIC) 0.5 % ophthalmic solution Place 1 drop  into the left eye every evening.    09/22/2015 at Unknown time  . warfarin (COUMADIN) 6 MG tablet Take 3-6 mg by mouth daily.    09/22/2015 at Unknown time  . meclizine (ANTIVERT) 50 MG tablet Take 1 tablet (50 mg total) by mouth 3 (three) times daily as needed. (Patient not taking: Reported on 09/23/2015) 30 tablet 0 Not Taking at Unknown time  . methotrexate (RHEUMATREX) 2.5 MG tablet Take 4 tablets (10 mg total) by mouth once a week. Resume once patient has completed IV antibiotics.   09/17/2015    Assessment: 74 y/o male who presents to the ED with transient AMS and weakness most likely attributed to meclizine administration. 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 INR is  therapeutic at 2.3. No bleeding noted, Hb is 10.7, platelets are normal.  PTA: 6 mg daily except 3 mg MWF  Goal of Therapy:  INR 2-3 Monitor platelets by anticoagulation protocol: Yes   Plan:  - Warfarin 3 mg PO tonight - INR daily - Monitor for s/sx of bleeding  [facility], Pharm.D., BCPS Clinical Pharmacist Pager: [phone] 09/23/2015 2:54 PM

## 2015-09-23 NOTE — ED Notes (Signed)
Patient transported to X-ray 

## 2015-09-23 NOTE — Progress Notes (Addendum)
ANTIBIOTIC CONSULT NOTE - INITIAL  Pharmacy Consult for vancomycin/zosyn Indication: rule out sepsis  Allergies  Allergen Reactions  . Dopamine Anaphylaxis  . Morphine And Related Other (See Comments)    Hallucinations, very bad reactions  . Vicodin [Hydrocodone-Acetaminophen] Nausea And Vomiting    Patient Measurements: Wt: 75.3 kg  Vital Signs: Temp: 98.8 F (37.1 C) (12/30 0947) Temp Source: Oral (12/30 0947) BP: 170/96 mmHg (12/30 0947) Pulse Rate: 99 (12/30 0947) Intake/Output from previous day:   Intake/Output from this shift:    Labs: No results for input(s): WBC, HGB, PLT, LABCREA, CREATININE in the last 72 hours. CrCl cannot be calculated (Unknown ideal weight.). No results for input(s): VANCOTROUGH, VANCOPEAK, VANCORANDOM, GENTTROUGH, GENTPEAK, GENTRANDOM, TOBRATROUGH, TOBRAPEAK, TOBRARND, AMIKACINPEAK, AMIKACINTROU, AMIKACIN in the last 72 hours.   Microbiology: Recent Results (from the past 720 hour(s))  Urine culture     Status: None   Collection Time: 08/27/15  9:25 AM  Result Value Ref Range Status   Specimen Description URINE, RANDOM  Final   Special Requests UR BAG PED  Final   Culture MULTIPLE SPECIES PRESENT, SUGGEST RECOLLECTION  Final   Report Status 08/28/2015 FINAL  Final  Culture, blood (routine x 2)     Status: None   Collection Time: 08/27/15  9:40 AM  Result Value Ref Range Status   Specimen Description BLOOD LEFT ANTECUBITAL  Final   Special Requests BOTTLES DRAWN AEROBIC AND ANAEROBIC 10CC  Final   Culture NO GROWTH 5 DAYS  Final   Report Status 09/01/2015 FINAL  Final  Culture, blood (routine x 2)     Status: None   Collection Time: 08/27/15  9:43 AM  Result Value Ref Range Status   Specimen Description BLOOD RIGHT WRIST  Final   Special Requests BOTTLES DRAWN AEROBIC AND ANAEROBIC 10CC  Final   Culture  Setup Time   Final    GRAM VARIABLE ROD AEROBIC BOTTLE ONLY CRITICAL RESULT CALLED TO, READ BACK BY AND VERIFIED WITH: J.  ODDONO,RN AT [id number] ON [id number] BY Rhea Bleacher    Culture   Final    LISTERIA MONOCYTOGENES Results Called to: L HONEYCUTT,RN AT 0911 08/30/15 BY L BENFIELD Standardized susceptibility testing for this organism is not available. HEALTH DEPARTMENT NOTIFIED    Report Status 09/20/2015 FINAL  Final  Culture, blood (routine x 2)     Status: None   Collection Time: 08/28/15 10:10 AM  Result Value Ref Range Status   Specimen Description BLOOD RIGHT HAND  Final   Special Requests BOTTLES DRAWN AEROBIC AND ANAEROBIC 10CC  Final   Culture NO GROWTH 5 DAYS  Final   Report Status 09/02/2015 FINAL  Final  Culture, blood (routine x 2)     Status: None   Collection Time: 08/28/15 10:16 AM  Result Value Ref Range Status   Specimen Description BLOOD LEFT ANTECUBITAL  Final   Special Requests BOTTLES DRAWN AEROBIC AND ANAEROBIC 10CC  Final   Culture NO GROWTH 5 DAYS  Final   Report Status 09/02/2015 FINAL  Final    Medical History: Past Medical History  Diagnosis Date  . BASAL CELL CARCINOMA, FACE 03/31/2009  . 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 CONSTIPATION 06/22/2008  . DIVERTICULOSIS, COLON 03/24/2007  . GERD 03/24/2007  . HYPERLIPIDEMIA 08/27/2007  . HYPERTENSION 12/14/2008  . Rheumatoid arthritis(714.0) 03/24/2007  . Blind right eye   . Retinal detachment     hx of  . Keratosis Oct. 2013  .  Fall at home Nov. 5, 2014  06-28-13    in the home and Outside as well  . 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 Stroke [facility]) Oct 2014  . 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 Patient is afebrile in ED and labs are pending. Pharmacy consulted to begin vancomycin and zosyn. Patient recently in hospital on 12/13, SCr at that admission stable at 1.3.  Goal of Therapy:  Vancomycin trough level 15-20  mcg/ml  Plan:  - Vancomycin 1500 mg IV x1 loading dose - Followed by Vancomycin 750 mg IV q12h - Zosyn 3.375g IV x1 (77min infusion), followed by Zosyn 3.375g IV q8h (4hr infusion) - Monitor C&S, vitals and clinical progression  Dimitri Ped, PharmD. PGY-1 Pharmacy Resident Pager: [phone] 09/23/2015,9:59 AM  Pharmacy Code Sepsis Protocol  Time of code sepsis page: 1019 Time of antibiotic delivery: 1019  Were antibiotics ordered at the time of the code sepsis page? Yes Was it required to contact the physician? [x]  Physician not contacted []  Physician contacted to order antibiotics for code sepsis []  Physician contacted to recommend changing antibiotics  Pharmacy consulted for: Vancomycin/Zosyn  Anti-infectives    Start     Dose/Rate Route Frequency Ordered Stop   09/23/15 2200  vancomycin (VANCOCIN) IVPB 750 mg/150 ml premix     750 mg 150 mL/hr over 60 Minutes Intravenous Every 12 hours 09/23/15 0958     09/23/15 1600  piperacillin-tazobactam (ZOSYN) IVPB 3.375 g     3.375 g 12.5 mL/hr over 240 Minutes Intravenous 3 times per day 09/23/15 0958     09/23/15 1000  piperacillin-tazobactam (ZOSYN) IVPB 3.375 g     3.375 g 100 mL/hr over 30 Minutes Intravenous  Once 09/23/15 0953     09/23/15 1000  vancomycin (VANCOCIN) IVPB 1000 mg/200 mL premix  Status:  Discontinued     1,000 mg 200 mL/hr over 60 Minutes Intravenous  Once 09/23/15 0953 09/23/15 0958   09/23/15 1000  vancomycin (VANCOCIN) 1,500 mg in sodium chloride 0.9 % 500 mL IVPB     1,500 mg 250 mL/hr over 120 Minutes Intravenous  Once 09/23/15 [id number]        Nurse education provided: []  Minutes left to administer antibiotics to achieve 1 hour goal [x]  Correct order of antibiotic administration [x]  Antibiotic Y-site compatibilities    Dimitri Ped, PharmD. PGY-1 Pharmacy Resident Pager: [phone]  09/23/2015, 10:19 AM

## 2015-09-23 NOTE — ED Notes (Signed)
Attempted report 

## 2015-09-23 NOTE — Consult Note (Signed)
Reason for Consult: Bilateral subdural hematomas Referring Physician: Dr. Elgie Congo Brett Castaneda is an 74 y.o. male.  HPI: The patient is a 74 year old white male who presents with a very complicated history. 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 More recently the patient was admitted with dizziness and had blood cultures positive for Listeria. He was started on antibiotics. An echocardiogram was performed which did not demonstrate any findings consistent with endocarditis but a large mobile atherosclerotic plaque was noted in the patient's aorta. The patient was therefore started on Coumadin and treated with antibiotics.  The patient has had periods of confusion. He was readmitted. The workup included a head CT which demonstrated small bilateral subdural hematomas. A neurosurgical consultation was requested by Dr. Darrick Meigs.  Presently the patient is alert and pleasant. He admits to mild headaches.  Past Medical History  Diagnosis Date  . BASAL CELL CARCINOMA, FACE 03/31/2009  . 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 CONSTIPATION 06/22/2008  . DIVERTICULOSIS, COLON 03/24/2007  . GERD 03/24/2007  . HYPERLIPIDEMIA 08/27/2007  . HYPERTENSION 12/14/2008  . Rheumatoid arthritis(714.0) 03/24/2007  . Blind right eye   . Retinal detachment     hx of  . Keratosis Oct. 2013  . Fall at home Nov. 5, 2014  06-28-13    in the home and Outside as well  . 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 Stroke [facility]) Oct 2014  . 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 Total knee arthroplasty      bital  . Carpal tunnel release Right 2003  . Cataract extraction  1980    removed cataract in left eye. Currently  blind in right eye  . Cholecystectomy  2001    Gall Bladder  . Retinal detachment surgery  2004  . Lumbar puncture  Oct. 21, 2014  . Nerve conduction    . Mri  07-14-13  . Carotid endarterectomy Right 08-15-07    cea  . Joint replacement  2000    Right knee replacement  . Eye surgery  [id number]    retinal detachment  . Eye surgery      left cataract surgery  . Shoulder surgery Left 2009    hx "frozen" shoulder  . Ep implantable device N/A 08/25/2015    Procedure: Loop Recorder Insertion;  Surgeon: Will Meredith Leeds, MD;  Location: [facility];  Service: Cardiovascular;  Laterality: N/A;  . Tee without cardioversion N/A 08/30/2015    Procedure: TRANSESOPHAGEAL ECHOCARDIOGRAM (TEE);  Surgeon: Larey Dresser, MD;  Location: [facility] ENDOSCOPY;  Service: Cardiovascular;  Laterality: N/A;    Family History  Problem Relation Age of Onset  . Stroke Mother 46  . Hypertension Mother   . Heart disease Father   . Aneurysm Father   . Coronary artery disease Sister   . Pneumonia Sister   . Heart disease Sister   . Diabetes Sister   . Hypertension Sister   . Colon cancer Neg Hx     Social History:  reports that he quit smoking about 35 years ago. His smoking use included Cigarettes. He quit after 23 years of use. He has never used smokeless tobacco. He reports that he  does not drink alcohol or use illicit drugs.  Allergies:  Allergies  Allergen Reactions  . Dopamine Anaphylaxis  . Morphine And Related Other (See Comments)    Hallucinations, very bad reactions  . Vicodin [Hydrocodone-Acetaminophen] Nausea And Vomiting    Medications:  I have reviewed the patient's current medications. Prior to Admission:  Prescriptions prior to admission  Medication Sig Dispense Refill Last Dose  . acetaminophen (TYLENOL) 500 MG tablet Take 500 mg by mouth every 6 (six) hours as needed for mild pain, moderate pain or fever.    09/23/2015 at Unknown time  . alendronate (FOSAMAX) 70 MG tablet  Take 70 mg by mouth once a week. Take with a full glass of water on an empty stomach. Takes on Wednesday   Past Week at Unknown time  . amLODipine (NORVASC) 5 MG tablet Take 1 tablet (5 mg total) by mouth daily. 30 tablet 0 09/22/2015 at Unknown time  . folic acid-pyridoxine-cyancobalamin (VIRT-VITE FORTE) 2.5-25-2 MG TABS Take 1 tablet by mouth once a week. Takes on days he takes methotrexate. (Saturdays)   Past Week at Unknown time  . latanoprost (XALATAN) 0.005 % ophthalmic solution Place 1 drop into the left eye at bedtime.    09/22/2015 at Unknown time  . Multiple Vitamins-Minerals (OCUVITE ADULT 50+) CAPS Take 1 capsule by mouth daily.   09/22/2015 at Unknown time  . polyethylene glycol (MIRALAX / GLYCOLAX) packet Take 17 g by mouth daily as needed (for constipation).   Past Week at Unknown time  . simvastatin (ZOCOR) 40 MG tablet Take 1 tablet (40 mg total) by mouth daily at 6 PM. 30 tablet 0 09/22/2015 at Unknown time  . sodium chloride (OCEAN) 0.65 % SOLN nasal spray Place 1 spray into both nostrils 3 (three) times daily.   09/22/2015 at Unknown time  . tamsulosin (FLOMAX) 0.4 MG CAPS capsule Take 0.4 mg by mouth daily.  0 09/22/2015 at Unknown time  . timolol (TIMOPTIC) 0.5 % ophthalmic solution Place 1 drop into the left eye every evening.    09/22/2015 at Unknown time  . warfarin (COUMADIN) 6 MG tablet Take 3-6 mg by mouth daily.    09/22/2015 at Unknown time  . meclizine (ANTIVERT) 50 MG tablet Take 1 tablet (50 mg total) by mouth 3 (three) times daily as needed. (Patient not taking: Reported on 09/23/2015) 30 tablet 0 Not Taking at Unknown time  . methotrexate (RHEUMATREX) 2.5 MG tablet Take 4 tablets (10 mg total) by mouth once a week. Resume once patient has completed IV antibiotics.   09/17/2015   Scheduled: . amLODipine  5 mg Oral Daily  . atorvastatin  20 mg Oral q1800  . feeding supplement (ENSURE ENLIVE)  237 mL Oral BID BM  . latanoprost  1 drop Left Eye QHS  . tamsulosin   0.4 mg Oral Daily  . timolol  1 drop Left Eye QPM   Continuous: . sodium chloride 50 mL/hr at 09/23/15 1423   LZJ:[id number], ondansetron **OR** ondansetron (ZOFRAN) IV, polyethylene glycol Anti-infectives    Start     Dose/Rate Route Frequency Ordered Stop   09/23/15 2200  vancomycin (VANCOCIN) IVPB 750 mg/150 ml premix  Status:  Discontinued     750 mg 150 mL/hr over 60 Minutes Intravenous Every 12 hours 09/23/15 0958 09/23/15 1414   09/23/15 1600  piperacillin-tazobactam (ZOSYN) IVPB 3.375 g  Status:  Discontinued     3.375 g 12.5 mL/hr over 240 Minutes Intravenous 3 times per day 09/23/15 0958 09/23/15 1414  09/23/15 1000  piperacillin-tazobactam (ZOSYN) IVPB 3.375 g     3.375 g 100 mL/hr over 30 Minutes Intravenous  Once 09/23/15 0953 09/23/15 1150   09/23/15 1000  vancomycin (VANCOCIN) IVPB 1000 mg/200 mL premix  Status:  Discontinued     1,000 mg 200 mL/hr over 60 Minutes Intravenous  Once 09/23/15 0953 09/23/15 0958   09/23/15 1000  vancomycin (VANCOCIN) 1,500 mg in sodium chloride 0.9 % 500 mL IVPB     1,500 mg 250 mL/hr over 120 Minutes Intravenous  Once 09/23/15 5784 09/23/15 1335       Results for orders placed or performed during the hospital encounter of 09/23/15 (from the past 48 hour(s))  Protime-INR     Status: Abnormal   Collection Time: 09/23/15  9:47 AM  Result Value Ref Range   Prothrombin Time 25.1 (H) 11.6 - 15.2 seconds   INR 2.30 (H) 0.00 - 1.49  Comprehensive metabolic panel     Status: Abnormal   Collection Time: 09/23/15  9:52 AM  Result Value Ref Range   Sodium 133 (L) 135 - 145 mmol/L   Potassium 3.9 3.5 - 5.1 mmol/L   Chloride 103 101 - 111 mmol/L   CO2 22 22 - 32 mmol/L   Glucose, Bld 119 (H) 65 - 99 mg/dL   BUN 11 6 - 20 mg/dL   Creatinine, Ser 1.23 0.61 - 1.24 mg/dL   Calcium 8.0 (L) 8.9 - 10.3 mg/dL   Total Protein 6.8 6.5 - 8.1 g/dL   Albumin 2.7 (L) 3.5 - 5.0 g/dL   AST 20 15 - 41 U/L   ALT 14 (L) 17 - 63 U/L   Alkaline  Phosphatase 61 38 - 126 U/L   Total Bilirubin 0.8 0.3 - 1.2 mg/dL   GFR calc non Af Amer 57 (L) >60 mL/min   GFR calc Af Amer >60 >60 mL/min    Comment: (NOTE) The eGFR has been calculated using the CKD EPI equation. This calculation has not been validated in all clinical situations. eGFR's persistently <60 mL/min signify possible Chronic Kidney Disease.    Anion gap 8 5 - 15  CBC with Differential     Status: Abnormal   Collection Time: 09/23/15  9:52 AM  Result Value Ref Range   WBC 10.6 (H) 4.0 - 10.5 K/uL   RBC 3.56 (L) 4.22 - 5.81 MIL/uL   Hemoglobin 10.7 (L) 13.0 - 17.0 g/dL   HCT 32.4 (L) 39.0 - 52.0 %   MCV 91.0 78.0 - 100.0 fL   MCH 30.1 26.0 - 34.0 pg   MCHC 33.0 30.0 - 36.0 g/dL   RDW 13.5 11.5 - 15.5 %   Platelets 235 150 - 400 K/uL   Neutrophils Relative % 66 %   Neutro Abs 7.1 1.7 - 7.7 K/uL   Lymphocytes Relative 21 %   Lymphs Abs 2.2 0.7 - 4.0 K/uL   Monocytes Relative 12 %   Monocytes Absolute 1.3 (H) 0.1 - 1.0 K/uL   Eosinophils Relative 1 %   Eosinophils Absolute 0.1 0.0 - 0.7 K/uL   Basophils Relative 0 %   Basophils Absolute 0.0 0.0 - 0.1 K/uL  I-Stat CG4 Lactic Acid, ED  (not at [facility])     Status: None   Collection Time: 09/23/15 10:00 AM  Result Value Ref Range   Lactic Acid, Venous 0.66 0.5 - 2.0 mmol/L  Urinalysis, Routine w reflex microscopic (not at [facility])     Status: Abnormal   Collection Time: 09/23/15  10:35 AM  Result Value Ref Range   Color, Urine YELLOW YELLOW   APPearance CLEAR CLEAR   Specific Gravity, Urine 1.006 1.005 - 1.030   pH 7.0 5.0 - 8.0   Glucose, UA NEGATIVE NEGATIVE mg/dL   Hgb urine dipstick TRACE (A) NEGATIVE   Bilirubin Urine NEGATIVE NEGATIVE   Ketones, ur NEGATIVE NEGATIVE mg/dL   Protein, ur NEGATIVE NEGATIVE mg/dL   Nitrite NEGATIVE NEGATIVE   Leukocytes, UA NEGATIVE NEGATIVE  Urine microscopic-add on     Status: Abnormal   Collection Time: 09/23/15 10:35 AM  Result Value Ref Range   Squamous Epithelial / LPF  NONE SEEN NONE SEEN   WBC, UA NONE SEEN 0 - 5 WBC/hpf   RBC / HPF 0-5 0 - 5 RBC/hpf   Bacteria, UA RARE (A) NONE SEEN  CG4 I-STAT (Lactic acid)     Status: None   Collection Time: 09/23/15  2:16 PM  Result Value Ref Range   Lactic Acid, Venous 0.90 0.5 - 2.0 mmol/L    Dg Chest 2 View  09/23/2015  CLINICAL DATA:  Fever.  Altered mental status. EXAM: CHEST  2 VIEW COMPARISON:  Chest x-rays dated 08/27/2015 and 08/19/2015 and chest CT dated 08/23/2015 FINDINGS: Heart size and pulmonary vascularity are normal. The patient has chronic interstitial lung disease with honeycombing at the bases as demonstrated on the prior CT scan. No acute infiltrates or effusions. Calcification and tortuosity of the thoracic aorta. Loop recorder in place. IMPRESSION: No acute abnormality. Chronic interstitial lung disease, most severe at the lung bases. Electronically Signed   By: Lorriane Shire M.D.   On: 09/23/2015 11:40   Ct Head Wo Contrast  09/23/2015  ADDENDUM REPORT: 09/23/2015 16:11 ADDENDUM: Critical Value/emergent results were called by me at the time of interpretation on 09/23/2015 at 1555 hours to Dr. Eleonore Chiquito , who verbally acknowledged these results. Electronically Signed   By: Misty Stanley M.D.   On: 09/23/2015 16:11  09/23/2015  CLINICAL DATA:  Initial encounter for confusion and lethargy that started last night and got worse today. EXAM: CT HEAD WITHOUT CONTRAST TECHNIQUE: Contiguous axial images were obtained from the base of the skull through the vertex without intravenous contrast. COMPARISON:  08/23/2015. 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 On the left, this is extending over the frontal parietal lobes and measures up to about 16 mm in thickness. Although the bulk of the extra-axial hemorrhages low-attenuation, there is some wispy areas of increased attenuation which may be related to a trace amount of acute on  chronic hemorrhage. Similar right frontal parietal acute subacute to chronic subdural hematoma extends up to the convexity. This has slightly more hyper attenuating fluid in it in a degree of acute on chronic subdural hemorrhage is suspected. There is approximately 3 mm of left-to-right midline shift. No evidence for hydrocephalus. No intra-axial mass lesion is evident. High right frontoparietal encephalomalacia again noted. Patchy low attenuation in the deep hemispheric and periventricular white matter is nonspecific, but likely reflects chronic microvascular ischemic demyelination. Postsurgical change noted in both globes The visualized paranasal sinuses and mastoid air cells are clear. IMPRESSION: Bilateral frontoparietal subdural hematomas extend up to the convexity. Bulk of the extra-axial fluid on today's study is low attenuation suggesting chronicity although there is some minimal wispy areas of high density material within the fluid collection suggesting acute on chronic hemorrhage. Left subdural is slightly larger and results and 3 mm  left to right midline shift. Atrophy with chronic small vessel white matter ischemic demyelination. Electronically Signed: By: Misty Stanley M.D. On: 09/23/2015 15:48    ROS: As above Blood pressure 153/85, pulse 101, temperature 98.2 F (36.8 C), temperature source Oral, resp. rate 18, height _0  (1.803 m), weight 75.978 kg (167 lb 8 oz), SpO2 96 %. Physical Exam  General: An alert and pleasant 74 year old unkempt white male in no apparent distress  HEENT: Normocephalic, atraumatic. 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 The patient's right cornea is opaque and he is chronically blind in that right eye which he attributes to eye surgery  Neck: Supple without masses or deformities. He has limited cervical range of motion.  Thorax: Symmetric  Abdomen: Soft  Extremities:  Unremarkable  Neurologic exam the patient is alert and oriented 2+ (the patient was oriented to person, [facility], month. He thought the date was the 25th.) Cranial nerves II through XII were examined bilaterally and normal except as above he has a partial right third nerve palsy and he is blind in his right eye. The patient's motor strength is grossly normal and his left bicep, tricep, hand grip, quadriceps, gastrocnemius. His right quadriceps and gastrocnemius strength is grossly normal. He is weak in his bilateral dorsiflexor/EHL. His right bicep and handgrip strength is 4+ over 5. His speech is normal. His sensation is normal to light touch sensation all tested dermatomes bilaterally. Cerebellar function is intact to rapid alternating movements of the upper extremities bilaterally.  I have reviewed the patient's head CT performed without contrast today. It demonstrates small bilateral subdural hematomas which are largely chronic with some subacute components. There is no significant mass effect.    Assessment/Plan: Bilateral subdural hematomas, Coumadin anticoagulation: I have discussed the situation with Dr. Darrick Meigs. His subdural hematomas are small and would not require surgical intervention unless they significantly enlarged. I recommended that we stop his Coumadin for now. I don't think his anticoagulation needs to be rapidly reversed as he is doing well clinically. He will need to follow-up with me in the office in a week or 2 for a follow-up head CT.  The more difficult issue is when to restart anticoagulation. This is up to the cardiologist and medical doctors, i.e. if they feel the risk of embolic phenomenon from his aortic plaque is great, then the risk of not anticoagulating him may be greater that anticoagulation. I will of course leave this up to their expertise.  Please let me know if I can be of further assistance.  Sharonne Ricketts D 09/23/2015, 7:38 PM

## 2015-09-24 ENCOUNTER — Inpatient Hospital Stay (HOSPITAL_COMMUNITY): Payer: Medicare Other

## 2015-09-24 ENCOUNTER — Observation Stay (HOSPITAL_COMMUNITY): Payer: Medicare Other

## 2015-09-24 DIAGNOSIS — H5441 Blindness, right eye, normal vision left eye: Secondary | ICD-10-CM | POA: Diagnosis present

## 2015-09-24 DIAGNOSIS — R262 Difficulty in walking, not elsewhere classified: Secondary | ICD-10-CM | POA: Diagnosis not present

## 2015-09-24 DIAGNOSIS — R404 Transient alteration of awareness: Secondary | ICD-10-CM | POA: Diagnosis not present

## 2015-09-24 DIAGNOSIS — I10 Essential (primary) hypertension: Secondary | ICD-10-CM | POA: Diagnosis not present

## 2015-09-24 DIAGNOSIS — K219 Gastro-esophageal reflux disease without esophagitis: Secondary | ICD-10-CM | POA: Diagnosis not present

## 2015-09-24 DIAGNOSIS — I6203 Nontraumatic chronic subdural hemorrhage: Secondary | ICD-10-CM | POA: Diagnosis present

## 2015-09-24 DIAGNOSIS — Z8249 Family history of ischemic heart disease and other diseases of the circulatory system: Secondary | ICD-10-CM | POA: Diagnosis not present

## 2015-09-24 DIAGNOSIS — N400 Enlarged prostate without lower urinary tract symptoms: Secondary | ICD-10-CM | POA: Diagnosis not present

## 2015-09-24 DIAGNOSIS — G9340 Encephalopathy, unspecified: Secondary | ICD-10-CM | POA: Diagnosis not present

## 2015-09-24 DIAGNOSIS — R509 Fever, unspecified: Principal | ICD-10-CM

## 2015-09-24 DIAGNOSIS — Z8673 Personal history of transient ischemic attack (TIA), and cerebral infarction without residual deficits: Secondary | ICD-10-CM | POA: Diagnosis not present

## 2015-09-24 DIAGNOSIS — Z85828 Personal history of other malignant neoplasm of skin: Secondary | ICD-10-CM | POA: Diagnosis not present

## 2015-09-24 DIAGNOSIS — Z885 Allergy status to narcotic agent status: Secondary | ICD-10-CM | POA: Diagnosis not present

## 2015-09-24 DIAGNOSIS — R4182 Altered mental status, unspecified: Secondary | ICD-10-CM | POA: Diagnosis not present

## 2015-09-24 DIAGNOSIS — M6281 Muscle weakness (generalized): Secondary | ICD-10-CM | POA: Diagnosis not present

## 2015-09-24 DIAGNOSIS — F0390 Unspecified dementia without behavioral disturbance: Secondary | ICD-10-CM | POA: Diagnosis present

## 2015-09-24 DIAGNOSIS — G1221 Amyotrophic lateral sclerosis: Secondary | ICD-10-CM | POA: Diagnosis present

## 2015-09-24 DIAGNOSIS — R278 Other lack of coordination: Secondary | ICD-10-CM | POA: Diagnosis not present

## 2015-09-24 DIAGNOSIS — R410 Disorientation, unspecified: Secondary | ICD-10-CM | POA: Diagnosis present

## 2015-09-24 DIAGNOSIS — E784 Other hyperlipidemia: Secondary | ICD-10-CM | POA: Diagnosis not present

## 2015-09-24 DIAGNOSIS — Z7901 Long term (current) use of anticoagulants: Secondary | ICD-10-CM | POA: Diagnosis not present

## 2015-09-24 DIAGNOSIS — Z79899 Other long term (current) drug therapy: Secondary | ICD-10-CM | POA: Diagnosis not present

## 2015-09-24 DIAGNOSIS — Z87891 Personal history of nicotine dependence: Secondary | ICD-10-CM | POA: Diagnosis not present

## 2015-09-24 DIAGNOSIS — I6200 Nontraumatic subdural hemorrhage, unspecified: Secondary | ICD-10-CM | POA: Diagnosis not present

## 2015-09-24 DIAGNOSIS — H3321 Serous retinal detachment, right eye: Secondary | ICD-10-CM | POA: Diagnosis present

## 2015-09-24 DIAGNOSIS — M069 Rheumatoid arthritis, unspecified: Secondary | ICD-10-CM | POA: Diagnosis not present

## 2015-09-24 DIAGNOSIS — R05 Cough: Secondary | ICD-10-CM | POA: Diagnosis not present

## 2015-09-24 DIAGNOSIS — Z823 Family history of stroke: Secondary | ICD-10-CM | POA: Diagnosis not present

## 2015-09-24 DIAGNOSIS — Z96651 Presence of right artificial knee joint: Secondary | ICD-10-CM | POA: Diagnosis present

## 2015-09-24 DIAGNOSIS — E785 Hyperlipidemia, unspecified: Secondary | ICD-10-CM | POA: Diagnosis present

## 2015-09-24 LAB — COMPREHENSIVE METABOLIC PANEL
ALBUMIN: 2.4 g/dL — AB (ref 3.5–5.0)
ALK PHOS: 50 U/L (ref 38–126)
ALT: 13 U/L — AB (ref 17–63)
AST: 20 U/L (ref 15–41)
Anion gap: 8 (ref 5–15)
BILIRUBIN TOTAL: 0.8 mg/dL (ref 0.3–1.2)
BUN: 10 mg/dL (ref 6–20)
CALCIUM: 8.1 mg/dL — AB (ref 8.9–10.3)
CO2: 21 mmol/L — AB (ref 22–32)
CREATININE: 1.11 mg/dL (ref 0.61–1.24)
Chloride: 108 mmol/L (ref 101–111)
GFR calc Af Amer: >60 mL/min (ref >60)
GFR calc non Af Amer: >60 mL/min (ref >60)
GLUCOSE: 101 mg/dL — AB (ref 65–99)
Potassium: 3.5 mmol/L (ref 3.5–5.1)
SODIUM: 137 mmol/L (ref 135–145)
TOTAL PROTEIN: 6.7 g/dL (ref 6.5–8.1)

## 2015-09-24 LAB — URINE CULTURE

## 2015-09-24 LAB — CBC
HEMATOCRIT: 30.8 % — AB (ref 39.0–52.0)
HEMOGLOBIN: 10.2 g/dL — AB (ref 13.0–17.0)
MCH: 29.9 pg (ref 26.0–34.0)
MCHC: 33.1 g/dL (ref 30.0–36.0)
MCV: 90.3 fL (ref 78.0–100.0)
Platelets: 217 10*3/uL (ref 150–400)
RBC: 3.41 MIL/uL — AB (ref 4.22–5.81)
RDW: 13.4 % (ref 11.5–15.5)
WBC: 9.6 10*3/uL (ref 4.0–10.5)

## 2015-09-24 LAB — MAGNESIUM: Magnesium: 1.7 mg/dL (ref 1.7–2.4)

## 2015-09-24 MED ORDER — ACETAMINOPHEN 325 MG PO TABS
650.0000 mg | ORAL_TABLET | Freq: Four times a day (QID) | ORAL | Status: DC | PRN
  Administered 2015-09-24 – 2015-09-26 (×4): 650 mg via ORAL
  Filled 2015-09-24 (×4): qty 2

## 2015-09-24 MED ORDER — HALOPERIDOL LACTATE 5 MG/ML IJ SOLN
1.0000 mg | Freq: Four times a day (QID) | INTRAMUSCULAR | Status: DC | PRN
  Administered 2015-09-24 – 2015-09-26 (×4): 1 mg via INTRAVENOUS
  Filled 2015-09-24 (×4): qty 1

## 2015-09-24 NOTE — Progress Notes (Signed)
Utilization Review Completed.Nuno Brubacher T12/31/2016  

## 2015-09-24 NOTE — Progress Notes (Signed)
Pt complaint of headache. Pt has become more confused. RN administer tylenol and made MD aware of pt's condition. Dorita Fray 09/24/2015 2:04 PM

## 2015-09-24 NOTE — Progress Notes (Signed)
Nutrition Brief Note  Patient identified on the Malnutrition Screening Tool (MST) Report.  Per wt readings, pt has had a 9% weight loss since February 2016; not significant for time frame.  Wt Readings from Last 15 Encounters:  09/23/15 167 lb 8 oz (75.978 kg)  08/31/15 166 lb 1.6 oz (75.342 kg)  08/24/15 173 lb (78.472 kg)  08/20/15 171 lb 12.8 oz (77.928 kg)  06/30/15 174 lb 6.4 oz (79.107 kg)  05/03/15 176 lb (79.833 kg)  03/01/15 175 lb (79.379 kg)  11/02/14 184 lb (83.462 kg)  08/05/14 184 lb (83.462 kg)  05/17/14 184 lb (83.462 kg)  03/05/14 183 lb 8 oz (83.235 kg)  01/18/14 183 lb (83.008 kg)  11/20/13 179 lb 2 oz (81.251 kg)  10/29/13 182 lb (82.555 kg)  10/19/13 177 lb (80.287 kg)    Body mass index is 23.37 kg/(m^2). Patient meets criteria for Normal based on current BMI.   Current diet order is Heart Healthy.  He reports a good appetite but does not like the hospital food.  Labs and medications reviewed.   No nutrition interventions warranted at this time. If nutrition issues arise, please consult RD.   Arthur Holms, RD, LDN Pager #: [phone] After-Hours Pager #: [phone]

## 2015-09-24 NOTE — Evaluation (Signed)
Physical Therapy Evaluation Patient Details Name: Brett Castaneda MRN: [id number] DOB: 1941/05/23 Today's Date: 09/24/2015   History of Present Illness  pt presents with Bil SDHs and recent admit for Listeria Bacteremia.  pt with hx of ALS, CVA, CA, RA, HTN, Bil TKAs, and R eye blindness.    Clinical Impression  Pt is oriented, but confused throughout session thinking we were in his home and that his wife was down the hall.  Pt needs frequent re-direction to stay on task and participate.  Once pt gotten to chair, pt then begins to have BM in chair, so was returned to bed on bed pan for BM.  Spoke with RN and unclear pt's baseline level of cognition, but per chart pt was able to function in his own home and even perform some outdoor chores.  Feel at this time pt will need SNF level of therapies at D/C.      Follow Up Recommendations SNF    Equipment Recommendations  None recommended by PT    Recommendations for Other Services       Precautions / Restrictions Precautions Precautions: Fall Restrictions Weight Bearing Restrictions: No      Mobility  Bed Mobility Overal bed mobility: Needs Assistance Bed Mobility: Supine to Sit     Supine to sit: Min assist;HOB elevated     General bed mobility comments: pt needs icnreased time and definite use of UEs on bed rails.    Transfers Overall transfer level: Needs assistance Equipment used: Rolling walker (2 wheeled) Transfers: Sit to/from [facility] Sit to Stand: Mod assist;+2 physical assistance Stand pivot transfers: Mod assist;+2 physical assistance       General transfer comment: cues and A for UE use, power up to standing, and movement through pivot to/from recliner.    Ambulation/Gait                Stairs            Wheelchair Mobility    Modified Rankin (Stroke Patients Only) Modified Rankin (Stroke Patients Only) Pre-Morbid Rankin Score: Slight disability Modified Rankin: Severe  disability     Balance Overall balance assessment: Needs assistance Sitting-balance support: Bilateral upper extremity supported;Feet supported Sitting balance-Leahy Scale: Fair     Standing balance support: Bilateral upper extremity supported;During functional activity Standing balance-Leahy Scale: Poor                               Pertinent Vitals/Pain Pain Assessment: Faces Faces Pain Scale: Hurts a little bit Pain Intervention(s): Monitored during session;Limited activity within patient's tolerance;Repositioned    Home Living Family/patient expects to be discharged to:: Skilled nursing facility Living Arrangements: Spouse/significant other                    Prior Function Level of Independence: Independent with assistive device(s)         Comments: Pt does not drive, but does do yard work at times [employer] at home w/o AD.  Uses rollator when out in community     Hand Dominance   Dominant Hand: Right    Extremity/Trunk Assessment   Upper Extremity Assessment: Generalized weakness           Lower Extremity Assessment: Generalized weakness;Difficult to assess due to impaired cognition      Cervical / Trunk Assessment: Kyphotic  Communication   Communication: No difficulties  Cognition Arousal/Alertness: Awake/alert Behavior During Therapy: Restless  Overall Cognitive Status: No family/caregiver present to determine baseline cognitive functioning                      General Comments      Exercises        Assessment/Plan    PT Assessment Patient needs continued PT services  PT Diagnosis Difficulty walking;Generalized weakness   PT Problem List Decreased strength;Decreased activity tolerance;Decreased balance;Decreased mobility;Decreased coordination;Decreased cognition;Decreased knowledge of use of DME;Decreased safety awareness  PT Treatment Interventions DME instruction;Gait training;Functional mobility  training;Therapeutic activities;Therapeutic exercise;Balance training;Neuromuscular re-education;Cognitive remediation;Patient/family education   PT Goals (Current goals can be found in the Care Plan section) Acute Rehab PT Goals Patient Stated Goal: pt not staying on topic to answer question. PT Goal Formulation: Patient unable to participate in goal setting Time For Goal Achievement: 10/08/15 Potential to Achieve Goals: Fair    Frequency Min 2X/week   Barriers to discharge        Co-evaluation               End of Session Equipment Utilized During Treatment: Gait belt Activity Tolerance: Patient limited by fatigue Patient left: in bed;with call bell/phone within reach;with bed alarm set Nurse Communication: Mobility status (pt on bed pan for BM.)         Time: [id number] PT Time Calculation (min) (ACUTE ONLY): 26 min   Charges:   PT Evaluation $Initial PT Evaluation Tier I: 1 Procedure PT Treatments $Therapeutic Activity: 8-22 mins   PT G CodesCatarina Hartshorn, [place] [phone] 09/24/2015, 2:13 PM

## 2015-09-24 NOTE — Plan of Care (Signed)
Discussed with Dr. Candiss Norse over the phone. I agree with discontinue coumadin at this time in light of b/l SDH. He has appointment with Dr. Leonie Man on 09/29/15 9:30am. Further decision will be made at that time with Dr. Leonie Man regarding anticoagulation use or further work up if needed. Please remind pt about the appointment. Thanks.  Rosalin Hawking, MD PhD Stroke Neurology 09/24/2015 7:26 AM

## 2015-09-24 NOTE — Progress Notes (Signed)
Patient Demographics:    Brett Castaneda, is a 74 y.o. male, DOB - [birth year], [id number]  Admit date - 09/23/2015   Admitting Physician Oswald Hillock, MD  Outpatient Primary MD for the patient is Nyoka Cowden, MD  LOS - 1   Chief Complaint  Patient presents with  . Fatigue  . Fever        Subjective:    Brett Castaneda today has, No headache, No chest pain, No abdominal pain - No Nausea, No new weakness tingling or numbness, No Cough - SOB.     Assessment  & Plan :     1. Fever and fatigue in a patient with recent Listeria bacteremia which has been treated appropriately with antibiotics. Currently no evident source, chest x-ray stable, UA borderline but stable, blood cultures pending. We'll monitor.   2. Subdural hemorrhage. Coumadin discontinued, discussed with neurology Dr. Erlinda Hong, neurosurgery already provided input, no surgical intervention needed, no anticoagulation and antiplatelets for 2 weeks thereafter he will follow with Dr. Leonie Man neurologist he has appointment on January 5. Two-week MRI on 10/05/2015, follow with neurosurgery as well post MRI.   3. Dyslipidemia. On statin continue.   4. Hypertension. Currently on Norvasc.   5. GERD. On PPI continue   6. BPH. Continue Flomax.    Code Status : Full  Family Communication  : None present  Disposition Plan  : SNF in 2-3 days  Consults  :    Neurosurgery  Neurology over the phone Dr. Erlinda Hong  Procedures  :    DVT Prophylaxis  :   SCDs    Lab Results  Component Value Date   PLT 217 09/24/2015    Inpatient Medications  Scheduled Meds: . amLODipine  5 mg Oral Daily  . atorvastatin  20 mg Oral q1800  . feeding supplement (ENSURE ENLIVE)  237 mL Oral BID BM  . latanoprost  1 drop Left Eye QHS  . tamsulosin  0.4 mg  Oral Daily  . timolol  1 drop Left Eye QPM   Continuous Infusions:  PRN Meds:.acetaminophen, famotidine, ondansetron **OR** ondansetron (ZOFRAN) IV, polyethylene glycol  Antibiotics  :     Anti-infectives    Start     Dose/Rate Route Frequency Ordered Stop   09/23/15 2200  vancomycin (VANCOCIN) IVPB 750 mg/150 ml premix  Status:  Discontinued     750 mg 150 mL/hr over 60 Minutes Intravenous Every 12 hours 09/23/15 0958 09/23/15 1414   09/23/15 1600  piperacillin-tazobactam (ZOSYN) IVPB 3.375 g  Status:  Discontinued     3.375 g 12.5 mL/hr over 240 Minutes Intravenous 3 times per day 09/23/15 0958 09/23/15 1414   09/23/15 1000  piperacillin-tazobactam (ZOSYN) IVPB 3.375 g     3.375 g 100 mL/hr over 30 Minutes Intravenous  Once 09/23/15 0953 09/23/15 1150   09/23/15 1000  vancomycin (VANCOCIN) IVPB 1000 mg/200 mL premix  Status:  Discontinued     1,000 mg 200 mL/hr over 60 Minutes Intravenous  Once 09/23/15 0953 09/23/15 0958   09/23/15 1000  vancomycin (VANCOCIN) 1,500 mg in sodium chloride 0.9 % 500 mL IVPB     1,500 mg 250 mL/hr over 120 Minutes Intravenous  Once 09/23/15 0958 09/23/15 1335  Objective:   Filed Vitals:   09/23/15 1651 09/23/15 2121 09/24/15 0508 09/24/15 0930  BP: 153/85 121/80 145/83 134/80  Pulse: 101 88 95 82  Temp:  97.6 F (36.4 C) 100.7 F (38.2 C)   TempSrc:  Oral Oral   Resp:  18 18   Height:      Weight:      SpO2:  98% 99%     Wt Readings from Last 3 Encounters:  09/23/15 75.978 kg (167 lb 8 oz)  08/31/15 75.342 kg (166 lb 1.6 oz)  08/24/15 78.472 kg (173 lb)     Intake/Output Summary (Last 24 hours) at 09/24/15 1153 Last data filed at 09/24/15 0402  Gross per 24 hour  Intake    405 ml  Output   1200 ml  Net   -795 ml     Physical Exam  Awake , mildly confused oriented x 2, No new F.N deficits, Normal affect [place].AT,PERRAL Supple Neck,No JVD, No cervical lymphadenopathy appriciated.  Symmetrical Chest wall movement, Good  air movement bilaterally, CTAB RRR,No Gallops,Rubs or new Murmurs, No Parasternal Heave +ve B.Sounds, Abd Soft, No tenderness, No organomegaly appriciated, No rebound - guarding or rigidity. No Cyanosis, Clubbing or edema, No new Rash or bruise       Data Review:   Micro Results Recent Results (from the past 240 hour(s))  Blood Culture (routine x 2)     Status: None (Preliminary result)   Collection Time: 09/23/15 10:00 AM  Result Value Ref Range Status   Specimen Description BLOOD RIGHT ANTECUBITAL  Final   Special Requests BOTTLES DRAWN AEROBIC AND ANAEROBIC 5CC  Final   Culture NO GROWTH < 24 HOURS  Final   Report Status PENDING  Incomplete  Blood Culture (routine x 2)     Status: None (Preliminary result)   Collection Time: 09/23/15 10:15 AM  Result Value Ref Range Status   Specimen Description BLOOD RIGHT HAND  Final   Special Requests BOTTLES DRAWN AEROBIC AND ANAEROBIC 5CC  Final   Culture NO GROWTH < 24 HOURS  Final   Report Status PENDING  Incomplete  Urine culture     Status: None   Collection Time: 09/23/15 10:35 AM  Result Value Ref Range Status   Specimen Description URINE, CLEAN CATCH  Final   Special Requests NONE  Final   Culture MULTIPLE SPECIES PRESENT, SUGGEST RECOLLECTION  Final   Report Status 09/24/2015 FINAL  Final    Radiology Reports Dg Chest 2 View  09/23/2015  CLINICAL DATA:  Fever.  Altered mental status. EXAM: CHEST  2 VIEW COMPARISON:  Chest x-rays dated 08/27/2015 and 08/19/2015 and chest CT dated 08/23/2015 FINDINGS: Heart size and pulmonary vascularity are normal. The patient has chronic interstitial lung disease with honeycombing at the bases as demonstrated on the prior CT scan. No acute infiltrates or effusions. Calcification and tortuosity of the thoracic aorta. Loop recorder in place. IMPRESSION: No acute abnormality. Chronic interstitial lung disease, most severe at the lung bases. Electronically Signed   By: Lorriane Shire M.D.   On:  09/23/2015 11:40   Ct Head Wo Contrast  09/23/2015  ADDENDUM REPORT: 09/23/2015 16:11 ADDENDUM: Critical Value/emergent results were called by me at the time of interpretation on 09/23/2015 at 1555 hours to Dr. Eleonore Chiquito , who verbally acknowledged these results. Electronically Signed   By: Misty Stanley M.D.   On: 09/23/2015 16:11  09/23/2015  CLINICAL DATA:  Initial encounter for confusion and lethargy that started last night  and got worse today. EXAM: CT HEAD WITHOUT CONTRAST TECHNIQUE: Contiguous axial images were obtained from the base of the skull through the vertex without intravenous contrast. COMPARISON:  08/23/2015. 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 On the left, this is extending over the frontal parietal lobes and measures up to about 16 mm in thickness. Although the bulk of the extra-axial hemorrhages low-attenuation, there is some wispy areas of increased attenuation which may be related to a trace amount of acute on chronic hemorrhage. Similar right frontal parietal acute subacute to chronic subdural hematoma extends up to the convexity. This has slightly more hyper attenuating fluid in it in a degree of acute on chronic subdural hemorrhage is suspected. There is approximately 3 mm of left-to-right midline shift. No evidence for hydrocephalus. No intra-axial mass lesion is evident. High right frontoparietal encephalomalacia again noted. Patchy low attenuation in the deep hemispheric and periventricular white matter is nonspecific, but likely reflects chronic microvascular ischemic demyelination. Postsurgical change noted in both globes The visualized paranasal sinuses and mastoid air cells are clear. IMPRESSION: Bilateral frontoparietal subdural hematomas extend up to the convexity. Bulk of the extra-axial fluid on today's study is low attenuation suggesting chronicity although there is some minimal wispy  areas of high density material within the fluid collection suggesting acute on chronic hemorrhage. Left subdural is slightly larger and results and 3 mm left to right midline shift. Atrophy with chronic small vessel white matter ischemic demyelination. Electronically Signed: By: Misty Stanley M.D. On: 09/23/2015 15:48   Dg Chest Port 1 View  08/27/2015  CLINICAL DATA:  Fever with back pain. History of ALS, CVA and rheumatoid arthritis. EXAM: PORTABLE CHEST 1 VIEW COMPARISON:  CT and radiographs 08/23/2015. FINDINGS: 0831 hours. There are lower lung volumes. Patient has chronic fibrotic changes at both lung bases with probable mild superimposed atelectasis related to the lower lung volumes. There is no consolidation or significant pleural effusion. The heart size and mediastinal contours are stable. IMPRESSION: Stable fibrotic changes at both lung bases with mild superimposed atelectasis. No evidence of pneumonia. Electronically Signed   By: Richardean Sale M.D.   On: 08/27/2015 10:45     CBC  Recent Labs Lab 09/23/15 0952 09/24/15 0706  WBC 10.6* 9.6  HGB 10.7* 10.2*  HCT 32.4* 30.8*  PLT 235 217  MCV 91.0 90.3  MCH 30.1 29.9  MCHC 33.0 33.1  RDW 13.5 13.4  LYMPHSABS 2.2  --   MONOABS 1.3*  --   EOSABS 0.1  --   BASOSABS 0.0  --     Chemistries   Recent Labs Lab 09/23/15 0952 09/24/15 0706  NA 133* 137  K 3.9 3.5  CL 103 108  CO2 22 21*  GLUCOSE 119* 101*  BUN 11 10  CREATININE 1.23 1.11  CALCIUM 8.0* 8.1*  MG  --  1.7  AST 20 20  ALT 14* 13*  ALKPHOS 61 50  BILITOT 0.8 0.8   ------------------------------------------------------------------------------------------------------------------ estimated creatinine clearance is 64.1 mL/min (by C-G formula based on Cr of 1.11). ------------------------------------------------------------------------------------------------------------------ No results for input(s): HGBA1C in the last 72  hours. ------------------------------------------------------------------------------------------------------------------ No results for input(s): CHOL, HDL, LDLCALC, TRIG, CHOLHDL, LDLDIRECT in the last 72 hours. ------------------------------------------------------------------------------------------------------------------ No results for input(s): TSH, T4TOTAL, T3FREE, THYROIDAB in the last 72 hours.  Invalid input(s): FREET3 ------------------------------------------------------------------------------------------------------------------ No results for input(s): VITAMINB12, FOLATE, FERRITIN, TIBC, IRON, RETICCTPCT in the last 72 hours.  Coagulation profile  Recent  Labs Lab 09/21/15 0922 09/23/15 0947  INR 1.2 2.30*    No results for input(s): DDIMER in the last 72 hours.  Cardiac Enzymes No results for input(s): CKMB, TROPONINI, MYOGLOBIN in the last 168 hours.  Invalid input(s): CK ------------------------------------------------------------------------------------------------------------------ Invalid input(s): POCBNP   Time Spent in minutes  35   SINGH,PRASHANT K M.D on 09/24/2015 at 11:53 AM  Between 7am to 7pm - Pager - [phone]  After 7pm go to [email or web address] - password [email or web address]  [facility] -  Office  [phone]

## 2015-09-25 LAB — COMPREHENSIVE METABOLIC PANEL
ALBUMIN: 2.6 g/dL — AB (ref 3.5–5.0)
ALT: 13 U/L — AB (ref 17–63)
AST: 21 U/L (ref 15–41)
Alkaline Phosphatase: 53 U/L (ref 38–126)
Anion gap: 9 (ref 5–15)
BILIRUBIN TOTAL: 0.8 mg/dL (ref 0.3–1.2)
BUN: 10 mg/dL (ref 6–20)
CHLORIDE: 103 mmol/L (ref 101–111)
CO2: 23 mmol/L (ref 22–32)
CREATININE: 1.09 mg/dL (ref 0.61–1.24)
Calcium: 8.5 mg/dL — ABNORMAL LOW (ref 8.9–10.3)
GFR calc Af Amer: >60 mL/min (ref >60)
GLUCOSE: 130 mg/dL — AB (ref 65–99)
Potassium: 3.3 mmol/L — ABNORMAL LOW (ref 3.5–5.1)
Sodium: 135 mmol/L (ref 135–145)
TOTAL PROTEIN: 7.3 g/dL (ref 6.5–8.1)

## 2015-09-25 LAB — CBC
HEMATOCRIT: 32.1 % — AB (ref 39.0–52.0)
Hemoglobin: 11 g/dL — ABNORMAL LOW (ref 13.0–17.0)
MCH: 30.7 pg (ref 26.0–34.0)
MCHC: 34.3 g/dL (ref 30.0–36.0)
MCV: 89.7 fL (ref 78.0–100.0)
Platelets: 215 10*3/uL (ref 150–400)
RBC: 3.58 MIL/uL — AB (ref 4.22–5.81)
RDW: 13.6 % (ref 11.5–15.5)
WBC: 9.8 10*3/uL (ref 4.0–10.5)

## 2015-09-25 LAB — MAGNESIUM: MAGNESIUM: 1.7 mg/dL (ref 1.7–2.4)

## 2015-09-25 MED ORDER — POTASSIUM CHLORIDE CRYS ER 20 MEQ PO TBCR
40.0000 meq | EXTENDED_RELEASE_TABLET | Freq: Once | ORAL | Status: AC
  Administered 2015-09-25: 40 meq via ORAL
  Filled 2015-09-25: qty 2

## 2015-09-25 MED ORDER — BOOST / RESOURCE BREEZE PO LIQD
1.0000 | Freq: Three times a day (TID) | ORAL | Status: DC
  Administered 2015-09-25 – 2015-09-26 (×5): 1 via ORAL

## 2015-09-25 MED ORDER — MAGNESIUM SULFATE IN D5W 10-5 MG/ML-% IV SOLN
1.0000 g | Freq: Once | INTRAVENOUS | Status: AC
  Administered 2015-09-25: 1 g via INTRAVENOUS
  Filled 2015-09-25: qty 100

## 2015-09-25 NOTE — Progress Notes (Signed)
Patient Demographics:    Brett Castaneda, is a 75 y.o. male, DOB - 17-Jun-1941, [id number]  Admit date - 09/23/2015   Admitting Physician Oswald Hillock, MD  Outpatient Primary MD for the patient is Nyoka Cowden, MD  LOS - 2   Chief Complaint  Patient presents with  . Fatigue  . Fever        Subjective:    [place] today has, No headache, No chest pain, No abdominal pain - No Nausea, No new weakness tingling or numbness, No Cough - SOB.     Assessment  & Plan :     1. Fever and fatigue in a patient with recent Listeria bacteremia which has been treated appropriately with antibiotics. Currently no evident source, chest x-ray stable, UA borderline but stable, blood cultures pending. We'll monitor repeat cultures.   2. Subdural hemorrhage. Coumadin discontinued, discussed with neurology Dr. Erlinda Hong, neurosurgery already provided input, no surgical intervention needed, no anticoagulation and antiplatelets for 2 weeks thereafter he will follow with Dr. Leonie Man neurologist he has appointment on January 5. Two-week MRI on 10/05/2015, follow with neurosurgery as well post MRI.   3. Dyslipidemia. On statin continue.   4. Hypertension. Currently on Norvasc.   5. GERD. On PPI continue   6. BPH. Continue Flomax.   7. Mild Delirium - ? Underlying early dementia, delirious when temp rises, CT repeat stable, PRN Rx.    Code Status : Full  Family Communication  : None present  Disposition Plan  : SNF in 2-3 days  Consults  :    Neurosurgery  Neurology over the phone Dr. Erlinda Hong  Procedures  :    DVT Prophylaxis  :   SCDs    Lab Results  Component Value Date   PLT 215 09/25/2015    Inpatient Medications  Scheduled Meds: . amLODipine  5 mg Oral Daily  . atorvastatin  20 mg Oral  q1800  . feeding supplement  1 Container Oral TID BM  . latanoprost  1 drop Left Eye QHS  . tamsulosin  0.4 mg Oral Daily  . timolol  1 drop Left Eye QPM   Continuous Infusions:  PRN Meds:.acetaminophen, famotidine, haloperidol lactate, [DISCONTINUED] ondansetron **OR** ondansetron (ZOFRAN) IV, polyethylene glycol  Antibiotics  :     Anti-infectives    Start     Dose/Rate Route Frequency Ordered Stop   09/23/15 2200  vancomycin (VANCOCIN) IVPB 750 mg/150 ml premix  Status:  Discontinued     750 mg 150 mL/hr over 60 Minutes Intravenous Every 12 hours 09/23/15 0958 09/23/15 1414   09/23/15 1600  piperacillin-tazobactam (ZOSYN) IVPB 3.375 g  Status:  Discontinued     3.375 g 12.5 mL/hr over 240 Minutes Intravenous 3 times per day 09/23/15 0958 09/23/15 1414   09/23/15 1000  piperacillin-tazobactam (ZOSYN) IVPB 3.375 g     3.375 g 100 mL/hr over 30 Minutes Intravenous  Once 09/23/15 0953 09/23/15 1150   09/23/15 1000  vancomycin (VANCOCIN) IVPB 1000 mg/200 mL premix  Status:  Discontinued     1,000 mg 200 mL/hr over 60 Minutes Intravenous  Once 09/23/15 0953 09/23/15 0958   09/23/15 1000  vancomycin (VANCOCIN) 1,500 mg in sodium chloride 0.9 % 500 mL IVPB  1,500 mg 250 mL/hr over 120 Minutes Intravenous  Once 09/23/15 0958 09/23/15 1335        Objective:   Filed Vitals:   09/24/15 1510 09/24/15 2230 09/25/15 0611 09/25/15 1000  BP: 135/79 165/88 153/84 159/95  Pulse: 92 95 100 106  Temp: 99.1 F (37.3 C) 97.7 F (36.5 C) 98.5 F (36.9 C)   TempSrc: Oral Oral Oral   Resp: 18 18 18    Height:      Weight:      SpO2: 100% 98% 99%     Wt Readings from Last 3 Encounters:  09/23/15 75.978 kg (167 lb 8 oz)  08/31/15 75.342 kg (166 lb 1.6 oz)  08/24/15 78.472 kg (173 lb)     Intake/Output Summary (Last 24 hours) at 09/25/15 1003 Last data filed at 09/25/15 [id number]  Gross per 24 hour  Intake      0 ml  Output   2200 ml  Net  -2200 ml     Physical Exam  Awake ,  mildly confused oriented x 2, No new F.N deficits, Normal affect [place].AT,PERRAL Supple Neck,No JVD, No cervical lymphadenopathy appriciated.  Symmetrical Chest wall movement, Good air movement bilaterally, CTAB RRR,No Gallops,Rubs or new Murmurs, No Parasternal Heave +ve B.Sounds, Abd Soft, No tenderness, No organomegaly appriciated, No rebound - guarding or rigidity. No Cyanosis, Clubbing or edema, No new Rash or bruise       Data Review:   Micro Results Recent Results (from the past 240 hour(s))  Blood Culture (routine x 2)     Status: None (Preliminary result)   Collection Time: 09/23/15 10:00 AM  Result Value Ref Range Status   Specimen Description BLOOD RIGHT ANTECUBITAL  Final   Special Requests BOTTLES DRAWN AEROBIC AND ANAEROBIC 5CC  Final   Culture NO GROWTH < 24 HOURS  Final   Report Status PENDING  Incomplete  Blood Culture (routine x 2)     Status: None (Preliminary result)   Collection Time: 09/23/15 10:15 AM  Result Value Ref Range Status   Specimen Description BLOOD RIGHT HAND  Final   Special Requests BOTTLES DRAWN AEROBIC AND ANAEROBIC 5CC  Final   Culture NO GROWTH < 24 HOURS  Final   Report Status PENDING  Incomplete  Urine culture     Status: None   Collection Time: 09/23/15 10:35 AM  Result Value Ref Range Status   Specimen Description URINE, CLEAN CATCH  Final   Special Requests NONE  Final   Culture MULTIPLE SPECIES PRESENT, SUGGEST RECOLLECTION  Final   Report Status 09/24/2015 FINAL  Final    Radiology Reports Dg Chest 2 View  09/24/2015  CLINICAL DATA:  Fever, fatigue, and cough. EXAM: CHEST  2 VIEW COMPARISON:  09/23/2015 FINDINGS: Heart is mildly enlarged. There is prominence of interstitial markings, stable compared prior study. Loop recorder visualized over the cardiac apex. There are no new focal consolidations or pleural effusions. Surgical clips are noted in the upper abdomen . IMPRESSION: 1. Cardiomegaly. 2. Stable interstitial prominence.  Electronically Signed   By: Nolon Nations M.D.   On: 09/24/2015 12:42   Dg Chest 2 View  09/23/2015  CLINICAL DATA:  Fever.  Altered mental status. EXAM: CHEST  2 VIEW COMPARISON:  Chest x-rays dated 08/27/2015 and 08/19/2015 and chest CT dated 08/23/2015 FINDINGS: Heart size and pulmonary vascularity are normal. The patient has chronic interstitial lung disease with honeycombing at the bases as demonstrated on the prior CT scan. No acute infiltrates or effusions.  Calcification and tortuosity of the thoracic aorta. Loop recorder in place. IMPRESSION: No acute abnormality. Chronic interstitial lung disease, most severe at the lung bases. Electronically Signed   By: Lorriane Shire M.D.   On: 09/23/2015 11:40   Ct Head Wo Contrast  09/24/2015  CLINICAL DATA:  75 year old male with acute encephalopathy. EXAM: CT HEAD WITHOUT CONTRAST TECHNIQUE: Contiguous axial images were obtained from the base of the skull through the vertex without intravenous contrast. COMPARISON:  09/23/2015 and prior exams. FINDINGS: Bilateral frontoparietal subdural collections are again identified and unchanged from 09/23/2015, with a stable small amount of acute hemorrhage on the right. These subdural collections again measure 0.8 cm on the right and 1.5 cm on the left. 3 mm left to right midline shift is again identified. Cerebral atrophy, chronic small-vessel white matter ischemic changes and remote right frontoparietal infarct again noted. No new hemorrhage or hydrocephalus identified. Postsurgical changes within both globes again noted. IMPRESSION: Stable bilateral frontoparietal subdural collections/hematomas with small area of acute hemorrhage on the right, and 3 mm left to right midline shift. Atrophy, chronic small-vessel white matter ischemic changes and remote right frontoparietal infarct. Electronically Signed   By: Margarette Canada M.D.   On: 09/24/2015 15:14   Ct Head Wo Contrast  09/23/2015  ADDENDUM REPORT: 09/23/2015  16:11 ADDENDUM: Critical Value/emergent results were called by me at the time of interpretation on 09/23/2015 at 1555 hours to Dr. Eleonore Chiquito , who verbally acknowledged these results. Electronically Signed   By: Misty Stanley M.D.   On: 09/23/2015 16:11  09/23/2015  CLINICAL DATA:  Initial encounter for confusion and lethargy that started last night and got worse today. EXAM: CT HEAD WITHOUT CONTRAST TECHNIQUE: Contiguous axial images were obtained from the base of the skull through the vertex without intravenous contrast. COMPARISON:  08/23/2015. 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 On the left, this is extending over the frontal parietal lobes and measures up to about 16 mm in thickness. Although the bulk of the extra-axial hemorrhages low-attenuation, there is some wispy areas of increased attenuation which may be related to a trace amount of acute on chronic hemorrhage. Similar right frontal parietal acute subacute to chronic subdural hematoma extends up to the convexity. This has slightly more hyper attenuating fluid in it in a degree of acute on chronic subdural hemorrhage is suspected. There is approximately 3 mm of left-to-right midline shift. No evidence for hydrocephalus. No intra-axial mass lesion is evident. High right frontoparietal encephalomalacia again noted. Patchy low attenuation in the deep hemispheric and periventricular white matter is nonspecific, but likely reflects chronic microvascular ischemic demyelination. Postsurgical change noted in both globes The visualized paranasal sinuses and mastoid air cells are clear. IMPRESSION: Bilateral frontoparietal subdural hematomas extend up to the convexity. Bulk of the extra-axial fluid on today's study is low attenuation suggesting chronicity although there is some minimal wispy areas of high density material within the fluid collection suggesting acute on  chronic hemorrhage. Left subdural is slightly larger and results and 3 mm left to right midline shift. Atrophy with chronic small vessel white matter ischemic demyelination. Electronically Signed: By: Misty Stanley M.D. On: 09/23/2015 15:48   Dg Chest Port 1 View  08/27/2015  CLINICAL DATA:  Fever with back pain. History of ALS, CVA and rheumatoid arthritis. EXAM: PORTABLE CHEST 1 VIEW COMPARISON:  CT and radiographs 08/23/2015. FINDINGS: 0831 hours. There are lower lung volumes. Patient has chronic  fibrotic changes at both lung bases with probable mild superimposed atelectasis related to the lower lung volumes. There is no consolidation or significant pleural effusion. The heart size and mediastinal contours are stable. IMPRESSION: Stable fibrotic changes at both lung bases with mild superimposed atelectasis. No evidence of pneumonia. Electronically Signed   By: Richardean Sale M.D.   On: 08/27/2015 10:45     CBC  Recent Labs Lab 09/23/15 0952 09/24/15 0706 09/25/15 0345  WBC 10.6* 9.6 9.8  HGB 10.7* 10.2* 11.0*  HCT 32.4* 30.8* 32.1*  PLT 235 217 215  MCV 91.0 90.3 89.7  MCH 30.1 29.9 30.7  MCHC 33.0 33.1 34.3  RDW 13.5 13.4 13.6  LYMPHSABS 2.2  --   --   MONOABS 1.3*  --   --   EOSABS 0.1  --   --   BASOSABS 0.0  --   --     Chemistries   Recent Labs Lab 09/23/15 0952 09/24/15 0706 09/25/15 0345  NA 133* 137 135  K 3.9 3.5 3.3*  CL 103 108 103  CO2 22 21* 23  GLUCOSE 119* 101* 130*  BUN 11 10 10   CREATININE 1.23 1.11 1.09  CALCIUM 8.0* 8.1* 8.5*  MG  --  1.7 1.7  AST 20 20 21   ALT 14* 13* 13*  ALKPHOS 61 50 53  BILITOT 0.8 0.8 0.8   ------------------------------------------------------------------------------------------------------------------ estimated creatinine clearance is 65.2 mL/min (by C-G formula based on Cr of 1.09). ------------------------------------------------------------------------------------------------------------------ No results for  input(s): HGBA1C in the last 72 hours. ------------------------------------------------------------------------------------------------------------------ No results for input(s): CHOL, HDL, LDLCALC, TRIG, CHOLHDL, LDLDIRECT in the last 72 hours. ------------------------------------------------------------------------------------------------------------------ No results for input(s): TSH, T4TOTAL, T3FREE, THYROIDAB in the last 72 hours.  Invalid input(s): FREET3 ------------------------------------------------------------------------------------------------------------------ No results for input(s): VITAMINB12, FOLATE, FERRITIN, TIBC, IRON, RETICCTPCT in the last 72 hours.  Coagulation profile  Recent Labs Lab 09/21/15 0922 09/23/15 0947  INR 1.2 2.30*    No results for input(s): DDIMER in the last 72 hours.  Cardiac Enzymes No results for input(s): CKMB, TROPONINI, MYOGLOBIN in the last 168 hours.  Invalid input(s): CK ------------------------------------------------------------------------------------------------------------------ Invalid input(s): POCBNP   Time Spent in minutes  35   SINGH,PRASHANT K M.D on 09/25/2015 at 10:03 AM  Between 7am to 7pm - Pager - [phone]  After 7pm go to [email or web address] - password [email or web address]  [facility] -  Office  [phone]

## 2015-09-25 NOTE — Plan of Care (Signed)
Problem: Nutrition: Goal: Adequate nutrition will be maintained Outcome: Not Progressing Pt would drink boost but eat little of meal trays

## 2015-09-26 DIAGNOSIS — I6200 Nontraumatic subdural hemorrhage, unspecified: Secondary | ICD-10-CM

## 2015-09-26 DIAGNOSIS — R7881 Bacteremia: Secondary | ICD-10-CM | POA: Diagnosis not present

## 2015-09-26 DIAGNOSIS — I10 Essential (primary) hypertension: Secondary | ICD-10-CM | POA: Diagnosis not present

## 2015-09-26 DIAGNOSIS — R404 Transient alteration of awareness: Secondary | ICD-10-CM | POA: Diagnosis not present

## 2015-09-26 DIAGNOSIS — R2689 Other abnormalities of gait and mobility: Secondary | ICD-10-CM | POA: Diagnosis not present

## 2015-09-26 DIAGNOSIS — I6203 Nontraumatic chronic subdural hemorrhage: Secondary | ICD-10-CM | POA: Diagnosis not present

## 2015-09-26 DIAGNOSIS — R451 Restlessness and agitation: Secondary | ICD-10-CM | POA: Diagnosis not present

## 2015-09-26 DIAGNOSIS — N400 Enlarged prostate without lower urinary tract symptoms: Secondary | ICD-10-CM | POA: Diagnosis not present

## 2015-09-26 DIAGNOSIS — M6281 Muscle weakness (generalized): Secondary | ICD-10-CM | POA: Diagnosis not present

## 2015-09-26 DIAGNOSIS — F0151 Vascular dementia with behavioral disturbance: Secondary | ICD-10-CM | POA: Diagnosis not present

## 2015-09-26 DIAGNOSIS — I638 Other cerebral infarction: Secondary | ICD-10-CM | POA: Diagnosis not present

## 2015-09-26 DIAGNOSIS — R627 Adult failure to thrive: Secondary | ICD-10-CM | POA: Diagnosis not present

## 2015-09-26 DIAGNOSIS — J189 Pneumonia, unspecified organism: Secondary | ICD-10-CM | POA: Diagnosis not present

## 2015-09-26 DIAGNOSIS — K219 Gastro-esophageal reflux disease without esophagitis: Secondary | ICD-10-CM | POA: Diagnosis not present

## 2015-09-26 DIAGNOSIS — R55 Syncope and collapse: Secondary | ICD-10-CM | POA: Diagnosis not present

## 2015-09-26 DIAGNOSIS — R262 Difficulty in walking, not elsewhere classified: Secondary | ICD-10-CM | POA: Diagnosis not present

## 2015-09-26 DIAGNOSIS — R4182 Altered mental status, unspecified: Secondary | ICD-10-CM | POA: Diagnosis not present

## 2015-09-26 DIAGNOSIS — R509 Fever, unspecified: Secondary | ICD-10-CM | POA: Diagnosis not present

## 2015-09-26 DIAGNOSIS — R410 Disorientation, unspecified: Secondary | ICD-10-CM | POA: Diagnosis not present

## 2015-09-26 DIAGNOSIS — E784 Other hyperlipidemia: Secondary | ICD-10-CM | POA: Diagnosis not present

## 2015-09-26 DIAGNOSIS — M0510 Rheumatoid lung disease with rheumatoid arthritis of unspecified site: Secondary | ICD-10-CM | POA: Diagnosis not present

## 2015-09-26 DIAGNOSIS — R05 Cough: Secondary | ICD-10-CM | POA: Diagnosis not present

## 2015-09-26 DIAGNOSIS — R5383 Other fatigue: Secondary | ICD-10-CM | POA: Diagnosis not present

## 2015-09-26 DIAGNOSIS — R278 Other lack of coordination: Secondary | ICD-10-CM | POA: Diagnosis not present

## 2015-09-26 DIAGNOSIS — M069 Rheumatoid arthritis, unspecified: Secondary | ICD-10-CM | POA: Diagnosis not present

## 2015-09-26 DIAGNOSIS — I639 Cerebral infarction, unspecified: Secondary | ICD-10-CM | POA: Diagnosis not present

## 2015-09-26 LAB — POTASSIUM: Potassium: 3.8 mmol/L (ref 3.5–5.1)

## 2015-09-26 MED ORDER — HALOPERIDOL 2 MG PO TABS: 2.0000 mg | ORAL_TABLET | Freq: Four times a day (QID) | ORAL | Status: DC | PRN

## 2015-09-26 NOTE — Discharge Summary (Signed)
Brett Castaneda, is a 75 y.o. male  DOB 07-Apr-1941  MRN [id number].  Admission date:  09/23/2015  Admitting Physician  Oswald Hillock, MD  Discharge Date:  09/26/2015   Primary MD  Nyoka Cowden, MD  Recommendations for primary care physician for things to follow:   Follow with PCP closely, check CBC and CMP along with a two-view chest x-ray in a week.  Follow final blood culture results which were drawn here and are negative for 3 days.  Needs to see Dr. Leonie Man neurologist on 09/28/2014 he already has an appointment please call a day before and confirm that time.  Needs MRI scan of his head noncontrast on 10/05/2015. Anticoagulation to be resumed by neurology based on his MRI results.   Admission Diagnosis  Confusion [R41.0] Fever, unspecified fever cause [R50.9]   Discharge Diagnosis  Confusion [R41.0] Fever, unspecified fever cause [R50.9]     Active Problems:   Essential hypertension   Weakness   Dizziness   Fever   Altered awareness, transient      Past Medical History  Diagnosis Date  . BASAL CELL CARCINOMA, FACE 03/31/2009  . 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 CONSTIPATION 06/22/2008  . DIVERTICULOSIS, COLON 03/24/2007  . GERD 03/24/2007  . HYPERLIPIDEMIA 08/27/2007  . HYPERTENSION 12/14/2008  . Rheumatoid arthritis(714.0) 03/24/2007  . Blind right eye   . Retinal detachment     hx of  . Keratosis Oct. 2013  . Fall at home Nov. 5, 2014  06-28-13    in the home and Outside as well  . 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 Stroke [facility]) Oct 2014  . 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 Total knee  arthroplasty      bital  . Carpal tunnel release Right 2003  . Cataract extraction  1980    removed cataract in left eye. Currently blind in right eye  . Cholecystectomy  2001    Gall Bladder  . Retinal detachment surgery  2004  . Lumbar puncture  Oct. 21, 2014  . Nerve conduction    . Mri  07-14-13  . Carotid endarterectomy Right 08-15-07    cea  . Joint replacement  2000    Right knee replacement  . Eye surgery  [id number]    retinal detachment  . Eye surgery      left cataract surgery  . Shoulder surgery Left 2009    hx "frozen" shoulder  . Ep implantable device N/A 08/25/2015    Procedure: Loop Recorder Insertion;  Surgeon: Will Meredith Leeds, MD;  Location: [facility];  Service: Cardiovascular;  Laterality: N/A;  . Tee without cardioversion N/A 08/30/2015    Procedure: TRANSESOPHAGEAL ECHOCARDIOGRAM (TEE);  Surgeon: Larey Dresser, MD;  Location: [facility];  Service: Cardiovascular;  Laterality: N/A;  HPI  from the history and physical done on the day of admission:   75 year old male, Patient was recently hospitalized from 08/26/15 - 12/7/-16 for dizziness and syncopal episode. During the hospitalization he was found to have blood cultures positive for Listeria, at that time he underwent TEE which was negative for endocarditis but showed large mobile atherosclerotic plaque in the aortic arch and subsequently started on Coumadin. Patient was discharged on 2 weeks of antibiotics, ampicillin and went to skilled nursing facility. Patient was discharged from the skilled facility on 09/16/2015 and went home. Patient has a loop recorder in place for the workup for syncope. For last 2 days patient has been complaining of more dizziness in mornings. Yesterday he had an episode where he got transiently confused and was unable to locate the side of the bed to get on the bed.  As per wife patient's confusion improved in the evening. He also had more impaired coordination than  usual. Patient is back to his baseline. He is alert and oriented 4. Denies any weakness at this time. In the ED he was found to have low-grade temp 100.4, and was empirically started on vancomycin and Zosyn by the ED physician due to concern for positive blood culture as listed in previous admission.  Chest x-ray showed no infiltrate, UA was negative.Patient denies nausea vomiting or diarrhea. No blurred vision. The he complains of dizziness and impaired coordination. Patient was started on meclizine 50 mg 3 times a day when necessary for dizziness and was given meclizine yesterday morning.     Hospital Course:   1. Fever and fatigue in a patient with recent Listeria bacteremia which has been treated appropriately with antibiotics. Currently no evident source, chest x-ray stable, UA borderline but stable, blood cultures so far negative, currently afebrile, could have had mild low-grade fevers due to subdural hemorrhage. Kindly continue to monitor repeat results which are negative till today which is day 3.  Of note patient at baseline appears to be quite frail and weak, needs close monitoring and work with PT. Discussed with patient's daughter who is a hospital employee in detail.   2. Subdural hemorrhage. Coumadin discontinued, discussed with neurology Dr. Erlinda Hong, neurosurgery already provided input, no surgical intervention needed, no anticoagulation and antiplatelets for 2 weeks thereafter he will follow with Dr. Leonie Man neurologist he has appointment on January 5. Two-week MRI on 10/05/2015, follow with neurosurgery as well post MRI. Future anticoagulation or antiplatelet medications to be addressed by neurology next visit in the office I would anticipate none of those till his repeat MRI.   3. Dyslipidemia. On statin continue.   4. Hypertension. Currently on Norvasc.   5. GERD. On PPI continue   6. BPH. Continue Flomax.   7. Mild Delirium - ? Underlying undiagnosed early dementia, CT  repeat stable, continue supportive therapy, minimize narcotic and benzodiazepine use. He responds well to Haldol.        Discharge Condition: Fair  Follow UP  Follow-up Information    Follow up with Nyoka Cowden, MD. Schedule an appointment as soon as possible for a visit in 1 week.   Specialty:  Internal Medicine   Contact information:   [place] [address] [phone]       Follow up with Ophelia Charter, MD. Schedule an appointment as soon as possible for a visit in 1 week.   Specialty:  Neurosurgery   Why:  [facility]   Contact information:   [address] [facility] [address]  [address] [place] [address] [phone]       Follow up with [facility]. Schedule an appointment as soon as possible for a visit on 09/29/2015.   Why:  Appointment with Dr Leonie Man - call 1 day before and confirm the time   Contact information:   [address]     [place] [phone]       Consults obtained - N.Surg, Neuro over the phone  Diet and Activity recommendation: See Discharge Instructions below  Discharge Instructions           Discharge Instructions    Diet - low sodium heart healthy    Complete by:  As directed      Discharge instructions    Complete by:  As directed   Follow with Primary MD Nyoka Cowden, MD in 7 days   Get CBC, CMP, 2 view Chest X ray checked  by Primary MD next visit.    Activity: As tolerated with Full fall precautions use walker/cane & assistance as needed   Disposition SNF   Diet:   Heart Healthy.  For Heart failure patients - Check your Weight same time everyday, if you gain over 2 pounds, or you develop in leg swelling, experience more shortness of breath or chest pain, call your Primary MD immediately. Follow Cardiac Low Salt Diet and 1.5 lit/day fluid restriction.   On your next visit with your primary care physician please Get Medicines reviewed  and adjusted.   Please request your Prim.MD to go over all Hospital Tests and Procedure/Radiological results at the follow up, please get all Hospital records sent to your Prim MD by signing hospital release before you go home.   If you experience worsening of your admission symptoms, develop shortness of breath, life threatening emergency, suicidal or homicidal thoughts you must seek medical attention immediately by calling 911 or calling your MD immediately  if symptoms less severe.  You Must read complete instructions/literature along with all the possible adverse reactions/side effects for all the Medicines you take and that have been prescribed to you. Take any new Medicines after you have completely understood and accpet all the possible adverse reactions/side effects.   Do not drive, operating heavy machinery, perform activities at heights, swimming or participation in water activities or provide baby sitting services if your were admitted for syncope or siezures until you have seen by Primary MD or a Neurologist and advised to do so again.  Do not drive when taking Pain medications.    Do not take more than prescribed Pain, Sleep and Anxiety Medications  Special Instructions: If you have smoked or chewed Tobacco  in the last 2 yrs please stop smoking, stop any regular Alcohol  and or any Recreational drug use.  Wear Seat belts while driving.   Please note  You were cared for by a hospitalist during your hospital stay. If you have any questions about your discharge medications or the care you received while you were in the hospital after you are discharged, you can call the unit and asked to speak with the hospitalist on call if the hospitalist that took care of you is not available. Once you are discharged, your primary care physician will handle any further medical issues. Please note that NO REFILLS for any discharge medications will be authorized once you are discharged, as it is  imperative that you return to your primary care physician (or establish a relationship with a primary care physician  if you do not have one) for your aftercare needs so that they can reassess your need for medications and monitor your lab values.     Increase activity slowly    Complete by:  As directed              Discharge Medications       Medication List    STOP taking these medications        meclizine 50 MG tablet  Commonly known as:  ANTIVERT     warfarin 6 MG tablet  Commonly known as:  COUMADIN      TAKE these medications        acetaminophen 500 MG tablet  Commonly known as:  TYLENOL  Take 500 mg by mouth every 6 (six) hours as needed for mild pain, moderate pain or fever.     alendronate 70 MG tablet  Commonly known as:  FOSAMAX  Take 70 mg by mouth once a week. Take with a full glass of water on an empty stomach. Takes on Wednesday     amLODipine 5 MG tablet  Commonly known as:  NORVASC  Take 1 tablet (5 mg total) by mouth daily.     haloperidol 2 MG tablet  Commonly known as:  HALDOL  Take 1 tablet (2 mg total) by mouth every 6 (six) hours as needed for agitation.     latanoprost 0.005 % ophthalmic solution  Commonly known as:  XALATAN  Place 1 drop into the left eye at bedtime.     methotrexate 2.5 MG tablet  Commonly known as:  RHEUMATREX  Take 4 tablets (10 mg total) by mouth once a week. Resume once patient has completed IV antibiotics.     OCUVITE ADULT 50+ Caps  Take 1 capsule by mouth daily.     polyethylene glycol packet  Commonly known as:  MIRALAX / GLYCOLAX  Take 17 g by mouth daily as needed (for constipation).     simvastatin 40 MG tablet  Commonly known as:  ZOCOR  Take 1 tablet (40 mg total) by mouth daily at 6 PM.     sodium chloride 0.65 % Soln nasal spray  Commonly known as:  OCEAN  Place 1 spray into both nostrils 3 (three) times daily.     tamsulosin 0.4 MG Caps capsule  Commonly known as:  FLOMAX  Take 0.4 mg by  mouth daily.     timolol 0.5 % ophthalmic solution  Commonly known as:  TIMOPTIC  Place 1 drop into the left eye every evening.     VIRT-VITE FORTE 2.5-25-2 MG Tabs tablet  Generic drug:  folic acid-pyridoxine-cyancobalamin  Take 1 tablet by mouth once a week. Takes on days he takes methotrexate. (Saturdays)        Major procedures and Radiology Reports - PLEASE review detailed and final reports for all details, in brief -     Dg Chest 2 View  09/24/2015  CLINICAL DATA:  Fever, fatigue, and cough. EXAM: CHEST  2 VIEW COMPARISON:  09/23/2015 FINDINGS: Heart is mildly enlarged. There is prominence of interstitial markings, stable compared prior study. Loop recorder visualized over the cardiac apex. There are no new focal consolidations or pleural effusions. Surgical clips are noted in the upper abdomen . IMPRESSION: 1. Cardiomegaly. 2. Stable interstitial prominence. Electronically Signed   By: Nolon Nations M.D.   On: 09/24/2015 12:42   Dg Chest 2 View  09/23/2015  CLINICAL DATA:  Fever.  Altered mental status. EXAM:  CHEST  2 VIEW COMPARISON:  Chest x-rays dated 08/27/2015 and 08/19/2015 and chest CT dated 08/23/2015 FINDINGS: Heart size and pulmonary vascularity are normal. The patient has chronic interstitial lung disease with honeycombing at the bases as demonstrated on the prior CT scan. No acute infiltrates or effusions. Calcification and tortuosity of the thoracic aorta. Loop recorder in place. IMPRESSION: No acute abnormality. Chronic interstitial lung disease, most severe at the lung bases. Electronically Signed   By: Lorriane Shire M.D.   On: 09/23/2015 11:40   Ct Head Wo Contrast  09/24/2015  CLINICAL DATA:  75 year old male with acute encephalopathy. EXAM: CT HEAD WITHOUT CONTRAST TECHNIQUE: Contiguous axial images were obtained from the base of the skull through the vertex without intravenous contrast. COMPARISON:  09/23/2015 and prior exams. FINDINGS: Bilateral frontoparietal  subdural collections are again identified and unchanged from 09/23/2015, with a stable small amount of acute hemorrhage on the right. These subdural collections again measure 0.8 cm on the right and 1.5 cm on the left. 3 mm left to right midline shift is again identified. Cerebral atrophy, chronic small-vessel white matter ischemic changes and remote right frontoparietal infarct again noted. No new hemorrhage or hydrocephalus identified. Postsurgical changes within both globes again noted. IMPRESSION: Stable bilateral frontoparietal subdural collections/hematomas with small area of acute hemorrhage on the right, and 3 mm left to right midline shift. Atrophy, chronic small-vessel white matter ischemic changes and remote right frontoparietal infarct. Electronically Signed   By: Margarette Canada M.D.   On: 09/24/2015 15:14   Ct Head Wo Contrast  09/23/2015  ADDENDUM REPORT: 09/23/2015 16:11 ADDENDUM: Critical Value/emergent results were called by me at the time of interpretation on 09/23/2015 at 1555 hours to Dr. Eleonore Chiquito , who verbally acknowledged these results. Electronically Signed   By: Misty Stanley M.D.   On: 09/23/2015 16:11  09/23/2015  CLINICAL DATA:  Initial encounter for confusion and lethargy that started last night and got worse today. EXAM: CT HEAD WITHOUT CONTRAST TECHNIQUE: Contiguous axial images were obtained from the base of the skull through the vertex without intravenous contrast. COMPARISON:  08/23/2015. 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 On the left, this is extending over the frontal parietal lobes and measures up to about 16 mm in thickness. Although the bulk of the extra-axial hemorrhages low-attenuation, there is some wispy areas of increased attenuation which may be related to a trace amount of acute on chronic hemorrhage. Similar right frontal parietal acute subacute to chronic subdural hematoma  extends up to the convexity. This has slightly more hyper attenuating fluid in it in a degree of acute on chronic subdural hemorrhage is suspected. There is approximately 3 mm of left-to-right midline shift. No evidence for hydrocephalus. No intra-axial mass lesion is evident. High right frontoparietal encephalomalacia again noted. Patchy low attenuation in the deep hemispheric and periventricular white matter is nonspecific, but likely reflects chronic microvascular ischemic demyelination. Postsurgical change noted in both globes The visualized paranasal sinuses and mastoid air cells are clear. IMPRESSION: Bilateral frontoparietal subdural hematomas extend up to the convexity. Bulk of the extra-axial fluid on today's study is low attenuation suggesting chronicity although there is some minimal wispy areas of high density material within the fluid collection suggesting acute on chronic hemorrhage. Left subdural is slightly larger and results and 3 mm left to right midline shift. Atrophy with chronic small vessel white matter ischemic demyelination. Electronically Signed: By: Verda Cumins.D.  On: 09/23/2015 15:48    Micro Results      Recent Results (from the past 240 hour(s))  Blood Culture (routine x 2)     Status: None (Preliminary result)   Collection Time: 09/23/15 10:00 AM  Result Value Ref Range Status   Specimen Description BLOOD RIGHT ANTECUBITAL  Final   Special Requests BOTTLES DRAWN AEROBIC AND ANAEROBIC 5CC  Final   Culture NO GROWTH 2 DAYS  Final   Report Status PENDING  Incomplete  Blood Culture (routine x 2)     Status: None (Preliminary result)   Collection Time: 09/23/15 10:15 AM  Result Value Ref Range Status   Specimen Description BLOOD RIGHT HAND  Final   Special Requests BOTTLES DRAWN AEROBIC AND ANAEROBIC 5CC  Final   Culture NO GROWTH 2 DAYS  Final   Report Status PENDING  Incomplete  Urine culture     Status: None   Collection Time: 09/23/15 10:35 AM  Result Value  Ref Range Status   Specimen Description URINE, CLEAN CATCH  Final   Special Requests NONE  Final   Culture MULTIPLE SPECIES PRESENT, SUGGEST RECOLLECTION  Final   Report Status 09/24/2015 FINAL  Final    Today   Subjective   Brett Castaneda today has no headache,no chest abdominal pain,no new weakness tingling or numbness, feels much better wants to go home today.     Objective   Blood pressure 131/78, pulse 96, temperature 99.2 F (37.3 C), temperature source Oral, resp. rate 15, height 5\' 11"  (1.803 m), weight 75.978 kg (167 lb 8 oz), SpO2 100 %.   Intake/Output Summary (Last 24 hours) at 09/26/15 0906 Last data filed at 09/26/15 0858  Gross per 24 hour  Intake    480 ml  Output      0 ml  Net    480 ml    Exam Awake, Oriented x 1, No new F.N deficits, Normal affect [place].AT,PERRAL Supple Neck,No JVD, No cervical lymphadenopathy appriciated.  Symmetrical Chest wall movement, Good air movement bilaterally, CTAB RRR,No Gallops,Rubs or new Murmurs, No Parasternal Heave +ve B.Sounds, Abd Soft, Non tender, No organomegaly appriciated, No rebound -guarding or rigidity. No Cyanosis, Clubbing or edema, No new Rash or bruise    Data Review   CBC w Diff:  Lab Results  Component Value Date   WBC 9.8 09/25/2015   WBC 11.4 09/06/2015   HGB 11.0* 09/25/2015   HCT 32.1* 09/25/2015   PLT 215 09/25/2015   LYMPHOPCT 21 09/23/2015   MONOPCT 12 09/23/2015   EOSPCT 1 09/23/2015   BASOPCT 0 09/23/2015    CMP:  Lab Results  Component Value Date   NA 135 09/25/2015   NA 135* 09/06/2015   K 3.8 09/26/2015   CL 103 09/25/2015   CO2 23 09/25/2015   BUN 10 09/25/2015   BUN 13 09/06/2015   CREATININE 1.09 09/25/2015   CREATININE 1.3 09/06/2015   GLU 82 09/06/2015   PROT 7.3 09/25/2015   PROT 7.2 07/07/2013   ALBUMIN 2.6* 09/25/2015   BILITOT 0.8 09/25/2015   ALKPHOS 53 09/25/2015   AST 21 09/25/2015   ALT 13* 09/25/2015    Total Time in preparing paper work, data evaluation  and todays exam - 35 minutes  Lala Lund K M.D on 09/26/2015 at 9:06 AM  [facility]   Office  [phone]

## 2015-09-26 NOTE — Clinical Social Work Placement (Signed)
   CLINICAL SOCIAL WORK PLACEMENT  NOTE  Date:  09/26/2015  Patient Details  Name: Brett Castaneda MRN: [id number] Date of Birth: 08-Jun-1941  Clinical Social Work is seeking post-discharge placement for this patient at the [place] level of care (*CSW will initial, date and re-position this form in  chart as items are completed):  Yes   Patient/family provided with [facility] Work Department's list of facilities offering this level of care within the geographic area requested by the patient (or if unable, by the patient's family).  Yes   Patient/family informed of their freedom to choose among providers that offer the needed level of care, that participate in Medicare, Medicaid or managed care program needed by the patient, have an available bed and are willing to accept the patient.  Yes   Patient/family informed of [facility]'s ownership interest in [facility] and [facility], as well as of the fact that they are under no obligation to receive care at these facilities.  PASRR submitted to EDS on       PASRR number received on       Existing PASRR number confirmed on 09/26/15     FL2 transmitted to all facilities in geographic area requested by pt/family on 09/26/15     FL2 transmitted to all facilities within larger geographic area on       Patient informed that his/her managed care company has contracts with or will negotiate with certain facilities, including the following:        Yes   Patient/family informed of bed offers received.  Patient chooses bed at [facility]     Physician recommends and patient chooses bed at      Patient to be [facility] on 09/26/15.  Patient to be transferred to facility by [facility]     Patient family notified on 09/26/15 of transfer.  Name of family member notified:  [name], spouse, [phone]     PHYSICIAN Please prepare prescriptions     Additional  Comment:    _______________________________________________ Benard Halsted, [place] 09/26/2015, 11:48 AM

## 2015-09-26 NOTE — Care Management Important Message (Signed)
Important Message  Patient Details  Name: HARINDER LOPEMAN MRN: [id number] Date of Birth: Apr 01, 1941   Medicare Important Message Given:  Yes    Louanne Belton 09/26/2015, 1:03 [place] Message  Patient Details  Name: NIQUAN DEFINA MRN: [id number] Date of Birth: 1940-12-27   Medicare Important Message Given:  Yes    Lucah Petta G 09/26/2015, 1:03 PM

## 2015-09-26 NOTE — NC FL2 (Signed)
[place] LEVEL OF CARE SCREENING TOOL     IDENTIFICATION  Patient Name: Brett Castaneda Birthdate: 02-22-1941 Sex: male Admission Date (Current Location): 09/23/2015  [facility] and [place] Number:  Herbalist and Address:  [facility], [place] [address] [place], [place] [address]      Provider Number: [id number]  Attending Physician Name and Address:  Thurnell Lose, MD  Relative Name and Phone Number:  Sharene Skeans, [phone]    Current Level of Care:   Recommended Level of Care: [place] Prior Approval Number:    Date Approved/Denied:   PASRR Number: [id number]  Discharge Plan: SNF    Current Diagnoses: Patient Active Problem List   Diagnosis Date Noted  . Fever 09/23/2015  . Altered awareness, transient 09/23/2015  . Encounter for therapeutic drug monitoring 09/21/2015  . Bacteremia 08/28/2015  . Fall 08/26/2015  . Scalp laceration 08/26/2015  . Weakness 08/26/2015  . Dizziness   . Syncope 08/24/2015  . ARF (acute renal failure) ([place]) 08/24/2015  . Pain in the chest   . Faintness   . Cerebral infarction due to unspecified mechanism   . CVA (cerebral infarction) 08/20/2015  . Hyponatremia 08/20/2015  . Anemia due to other cause 08/20/2015  . Thrombocytopenia ([place]) 08/20/2015  . Acute ischemic stroke ([place]) 08/20/2015  . Pyrexia   . 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 Pain in limb-Left leg 07/30/2013  . Weakness of distal arms and legs 07/30/2013  . Aftercare following surgery of the circulatory system, [facility] 07/30/2013  . Sensorimotor neuropathy ([place]) 07/07/2013  . AAA (abdominal aortic aneurysm) without rupture ([place]) 04/16/2013  . CAP (community acquired pneumonia) 04/09/2013  . Chest pain 04/09/2013  . 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 Malignant neoplasm of skin of parts of face  03/31/2009  . Essential hypertension 12/14/2008  . CONSTIPATION 06/22/2008  . Dyslipidemia 08/27/2007  . Other and unspecified hyperlipidemia 08/27/2007  . GERD 03/24/2007  . DIVERTICULOSIS, COLON 03/24/2007  . Rheumatoid arthritis ([place]) 03/24/2007  . CEREBROVASCULAR ACCIDENT, HX OF 03/24/2007    Orientation RESPIRATION BLADDER Height & Weight    Self, Place  Normal Incontinent, External catheter (Condom catheter) 5\' 11"  (180.3 cm) 167 lbs.  BEHAVIORAL SYMPTOMS/MOOD NEUROLOGICAL BOWEL NUTRITION STATUS   (N/A)  (N/A) Continent  (Cardiac)  AMBULATORY STATUS COMMUNICATION OF NEEDS Skin   Extensive Assist Verbally Normal                       Personal Care Assistance Level of Assistance  Bathing, Feeding, Dressing, Total care Bathing Assistance: Maximum assistance Feeding assistance: Maximum assistance Dressing Assistance: Maximum assistance Total Care Assistance: Maximum assistance   Functional Limitations Info             SPECIAL CARE FACTORS FREQUENCY  PT (By licensed PT)     PT Frequency: 5x/week              Contractures      Additional Factors Info  Code Status, Allergies Code Status Info: Full Allergies Info: Dopamine, Morphine And Related, Vicodin           Current Medications (09/26/2015):  This is the current hospital active medication list Current Facility-Administered Medications  Medication Dose Route Frequency Provider Last Rate Last Dose  . acetaminophen (TYLENOL) tablet 650 mg  650 mg Oral Q6H PRN Dianne Dun,  NP   650 mg at 09/26/15 0500  . amLODipine (NORVASC) tablet 5 mg  5 mg Oral Daily Oswald Hillock, MD   5 mg at 09/26/15 1030  . atorvastatin (LIPITOR) tablet 20 mg  20 mg Oral q1800 Oswald Hillock, MD   20 mg at 09/25/15 1723  . famotidine (PEPCID) tablet 20 mg  20 mg Oral Q12H PRN Dianne Dun, NP   20 mg at 09/24/15 1844  . feeding supplement (BOOST / RESOURCE BREEZE) liquid 1 Container  1 Container Oral TID BM  Thurnell Lose, MD   1 Container at 09/25/15 2003  . haloperidol lactate (HALDOL) injection 1 mg  1 mg Intravenous Q6H PRN Thurnell Lose, MD   1 mg at 09/25/15 2007  . latanoprost (XALATAN) 0.005 % ophthalmic solution 1 drop  1 drop Left Eye QHS Oswald Hillock, MD   1 drop at 09/25/15 2007  . ondansetron (ZOFRAN) injection 4 mg  4 mg Intravenous Q6H PRN Oswald Hillock, MD      . polyethylene glycol (MIRALAX / GLYCOLAX) packet 17 g  17 g Oral Daily PRN Oswald Hillock, MD      . tamsulosin (FLOMAX) capsule 0.4 mg  0.4 mg Oral Daily Oswald Hillock, MD   0.4 mg at 09/26/15 1030  . timolol (TIMOPTIC) 0.5 % ophthalmic solution 1 drop  1 drop Left Eye QPM Oswald Hillock, MD   1 drop at 09/25/15 1723     Discharge Medications: Please see discharge summary for a list of discharge medications.  Relevant Imaging Results:  Relevant Lab Results:   Additional Information SSN: [id number]  Benard Halsted, LCSWA

## 2015-09-26 NOTE — Progress Notes (Signed)
Patient will DC to: [facility] Anticipated DC date: 09/26/15 Family notified: Wife, daughter Transport by: PTAR 3:30pm  CSW signing off.  Cedric Fishman, [place] Social Worker [phone]

## 2015-09-26 NOTE — Clinical Social Work Note (Signed)
Clinical Social Work Assessment  Patient Details  Name: Brett Castaneda MRN: [id number] Date of Birth: July 24, 1941  Date of referral:  09/26/15               Reason for consult:  Facility Placement                Permission sought to share information with:  Facility Sport and exercise psychologist, Family Supports Permission granted to share information::  No (Patient disoriented; Completed assessment w/ wife, Brett Castaneda.)  Name::     Brett Castaneda::  [facility]  Relationship::  Wife  Contact Information:  [phone]  Housing/Transportation Living arrangements for the past 2 months:  Single Family Home Source of Information:  Spouse Patient Interpreter Needed:  None Criminal Activity/Legal Involvement Pertinent to Current Situation/Hospitalization:  No - Comment as needed Significant Relationships:  Adult Children, Spouse Lives with:  Spouse Do you feel safe going back to the place where you live?  No Need for family participation in patient care:  Yes (Comment)  Care giving concerns:  CSW received referral for possible SNF placement at time of discharge. Patient is disoriented therefore CSW spoke w/ patient's spouse regarding PT recommendation of SNF placement at time of discharge. Per patient's wife, patient's wife is currently unable to care for patient at their home given patient's current physical needs and fall risk. Patient's wife expressed understanding of PT recommendation and is agreeable to SNF placement at time of discharge. CSW to continue to follow and assist with discharge planning needs.   Social Worker assessment / plan:  CSW spoke with patient's wife concerning possibility of rehab at [facility] before returning home.  Employment status:  Retired Forensic scientist:    PT Recommendations:  [facility] Cast[facility] / Referral to community resources:  [place]  Patient/Family's Response to care:  Patient's wife recognizes need for rehab before  returning home and is agreeable to a SNF in [place]. Patient's family reported preference for [facility]  Patient/Family's Understanding of and Emotional Response to Diagnosis, Current Treatment, and Prognosis:  Patient is realistic regarding therapy needs. No questions/concerns about plan or treatment.    Emotional Assessment Appearance:  Appears stated age Attitude/Demeanor/Rapport:  Unable to Assess (Patient disoriented) Affect (typically observed):  Unable to Assess (Patient disoriented) Orientation:  Oriented to Self Alcohol / Substance use:  Not Applicable Psych involvement (Current and /or in the community):  No (Comment)  Discharge Needs  Concerns to be addressed:  Care Coordination Readmission within the last 30 days:  Yes Current discharge risk:  None Barriers to Discharge:  No Barriers Identified   Brett Castaneda, [place] 09/26/2015, 11:00 AM

## 2015-09-26 NOTE — Discharge Instructions (Signed)
Follow with Primary MD Nyoka Cowden, MD in 7 days   Get CBC, CMP, 2 view Chest X ray checked  by Primary MD next visit.    Activity: As tolerated with Full fall precautions use walker/cane & assistance as needed   Disposition SNF   Diet:   Heart Healthy.  For Heart failure patients - Check your Weight same time everyday, if you gain over 2 pounds, or you develop in leg swelling, experience more shortness of breath or chest pain, call your Primary MD immediately. Follow Cardiac Low Salt Diet and 1.5 lit/day fluid restriction.   On your next visit with your primary care physician please Get Medicines reviewed and adjusted.   Please request your Prim.MD to go over all Hospital Tests and Procedure/Radiological results at the follow up, please get all Hospital records sent to your Prim MD by signing hospital release before you go home.   If you experience worsening of your admission symptoms, develop shortness of breath, life threatening emergency, suicidal or homicidal thoughts you must seek medical attention immediately by calling 911 or calling your MD immediately  if symptoms less severe.  You Must read complete instructions/literature along with all the possible adverse reactions/side effects for all the Medicines you take and that have been prescribed to you. Take any new Medicines after you have completely understood and accpet all the possible adverse reactions/side effects.   Do not drive, operating heavy machinery, perform activities at heights, swimming or participation in water activities or provide baby sitting services if your were admitted for syncope or siezures until you have seen by Primary MD or a Neurologist and advised to do so again.  Do not drive when taking Pain medications.    Do not take more than prescribed Pain, Sleep and Anxiety Medications  Special Instructions: If you have smoked or chewed Tobacco  in the last 2 yrs please stop smoking, stop any  regular Alcohol  and or any Recreational drug use.  Wear Seat belts while driving.   Please note  You were cared for by a hospitalist during your hospital stay. If you have any questions about your discharge medications or the care you received while you were in the hospital after you are discharged, you can call the unit and asked to speak with the hospitalist on call if the hospitalist that took care of you is not available. Once you are discharged, your primary care physician will handle any further medical issues. Please note that NO REFILLS for any discharge medications will be authorized once you are discharged, as it is imperative that you return to your primary care physician (or establish a relationship with a primary care physician if you do not have one) for your aftercare needs so that they can reassess your need for medications and monitor your lab values.

## 2015-09-26 NOTE — Progress Notes (Signed)
Nsg Discharge Note  Admit Date:  09/23/2015 Discharge date: 09/26/2015   Brett Castaneda to be D/C'd Rehab per MD order.  AVS completed.  Copy for chart, and copy for patient signed, and dated. Patient/caregiver able to verbalize understanding.  Discharge Medication:   Medication List    STOP taking these medications        meclizine 50 MG tablet  Commonly known as:  ANTIVERT     warfarin 6 MG tablet  Commonly known as:  COUMADIN      TAKE these medications        acetaminophen 500 MG tablet  Commonly known as:  TYLENOL  Take 500 mg by mouth every 6 (six) hours as needed for mild pain, moderate pain or fever.     alendronate 70 MG tablet  Commonly known as:  FOSAMAX  Take 70 mg by mouth once a week. Take with a full glass of water on an empty stomach. Takes on Wednesday     amLODipine 5 MG tablet  Commonly known as:  NORVASC  Take 1 tablet (5 mg total) by mouth daily.     haloperidol 2 MG tablet  Commonly known as:  HALDOL  Take 1 tablet (2 mg total) by mouth every 6 (six) hours as needed for agitation.     latanoprost 0.005 % ophthalmic solution  Commonly known as:  XALATAN  Place 1 drop into the left eye at bedtime.     methotrexate 2.5 MG tablet  Commonly known as:  RHEUMATREX  Take 4 tablets (10 mg total) by mouth once a week. Resume once patient has completed IV antibiotics.     OCUVITE ADULT 50+ Caps  Take 1 capsule by mouth daily.     polyethylene glycol packet  Commonly known as:  MIRALAX / GLYCOLAX  Take 17 g by mouth daily as needed (for constipation).     simvastatin 40 MG tablet  Commonly known as:  ZOCOR  Take 1 tablet (40 mg total) by mouth daily at 6 PM.     sodium chloride 0.65 % Soln nasal spray  Commonly known as:  OCEAN  Place 1 spray into both nostrils 3 (three) times daily.     tamsulosin 0.4 MG Caps capsule  Commonly known as:  FLOMAX  Take 0.4 mg by mouth daily.     timolol 0.5 % ophthalmic solution  Commonly known as:  TIMOPTIC   Place 1 drop into the left eye every evening.     VIRT-VITE FORTE 2.5-25-2 MG Tabs tablet  Generic drug:  folic acid-pyridoxine-cyancobalamin  Take 1 tablet by mouth once a week. Takes on days he takes methotrexate. (Saturdays)        Discharge Assessment: Filed Vitals:   09/26/15 0519 09/26/15 1335  BP: 131/78 179/85  Pulse: 96 99  Temp: 99.2 F (37.3 C) 98.3 F (36.8 C)  Resp: 15 16  Skin clean, dry and intact without evidence of skin break down, no evidence of skin tears noted. IV catheter discontinued intact. Site without signs and symptoms of complications - no redness or edema noted at insertion site, patient denies c/o pain - only slight tenderness at site.  Dressing with slight pressure applied.  D/c Instructions-Education: Discharge instructions given to patient/family with verbalized understanding. D/c education completed with patient/family including follow up instructions, medication list, d/c activities limitations if indicated, with other d/c instructions as indicated by MD - patient able to verbalize understanding, all questions fully answered. Patient instructed to return to  ED, call 911, or call MD for any changes in condition.  Patient D/C via [facility] to [facility]. Called report to nurse at facility  Salley Slaughter, RN 09/26/2015 4:37 PM

## 2015-09-27 ENCOUNTER — Other Ambulatory Visit (HOSPITAL_COMMUNITY): Payer: Self-pay | Admitting: Internal Medicine

## 2015-09-27 ENCOUNTER — Ambulatory Visit (INDEPENDENT_AMBULATORY_CARE_PROVIDER_SITE_OTHER): Payer: Medicare Other | Admitting: *Deleted

## 2015-09-27 DIAGNOSIS — I10 Essential (primary) hypertension: Secondary | ICD-10-CM | POA: Diagnosis not present

## 2015-09-27 DIAGNOSIS — N400 Enlarged prostate without lower urinary tract symptoms: Secondary | ICD-10-CM | POA: Diagnosis not present

## 2015-09-27 DIAGNOSIS — R55 Syncope and collapse: Secondary | ICD-10-CM

## 2015-09-27 DIAGNOSIS — I6200 Nontraumatic subdural hemorrhage, unspecified: Secondary | ICD-10-CM | POA: Diagnosis not present

## 2015-09-27 DIAGNOSIS — R7881 Bacteremia: Secondary | ICD-10-CM | POA: Diagnosis not present

## 2015-09-28 LAB — CULTURE, BLOOD (ROUTINE X 2)
CULTURE: NO GROWTH
Culture: NO GROWTH

## 2015-09-28 SURGERY — Surgical Case
Anesthesia: *Unknown

## 2015-09-29 ENCOUNTER — Ambulatory Visit: Payer: Self-pay

## 2015-09-29 ENCOUNTER — Encounter: Payer: Self-pay | Admitting: Neurology

## 2015-09-29 ENCOUNTER — Telehealth: Payer: Self-pay | Admitting: Neurology

## 2015-09-29 ENCOUNTER — Ambulatory Visit (INDEPENDENT_AMBULATORY_CARE_PROVIDER_SITE_OTHER): Payer: Medicare Other | Admitting: Neurology

## 2015-09-29 VITALS — BP 135/87 | HR 95 | Ht 71.0 in

## 2015-09-29 DIAGNOSIS — R451 Restlessness and agitation: Secondary | ICD-10-CM | POA: Diagnosis not present

## 2015-09-29 DIAGNOSIS — I6203 Nontraumatic chronic subdural hemorrhage: Secondary | ICD-10-CM | POA: Diagnosis not present

## 2015-09-29 DIAGNOSIS — I6200 Nontraumatic subdural hemorrhage, unspecified: Secondary | ICD-10-CM | POA: Insufficient documentation

## 2015-09-29 MED ORDER — ASPIRIN EC 81 MG PO TBEC: 81.0000 mg | DELAYED_RELEASE_TABLET | Freq: Every day | ORAL | Status: AC

## 2015-09-29 MED ORDER — DIVALPROEX SODIUM 500 MG PO DR TAB: 500.0000 mg | DELAYED_RELEASE_TABLET | Freq: Two times a day (BID) | ORAL | Status: DC

## 2015-09-29 NOTE — Progress Notes (Signed)
Carelink Summary Report / Loop Recorder 

## 2015-09-29 NOTE — Telephone Encounter (Signed)
Rn call Megan(RN) supervisor at [facility] facility about when should patient start the aspirin. Jinny Blossom stated patient had a CT scan today and wife stated pt should not take until after the CT scan results. Rn was advised by Dr.Sethi that pts CT scan was normal and he can start aspirin 81mg  now. Megan verbalized understanding.

## 2015-09-29 NOTE — Telephone Encounter (Signed)
Megan with [facility] has called and needs clarification with orders. She states that there is some confusion with medication list as well. Please call and advise [phone]

## 2015-09-29 NOTE — Patient Instructions (Addendum)
I had a long discussion with the patient and his wife. The patient appeared quite agitated and restless during this visit limiting evaluation but clearly he does not seem to be a good candidate for anticoagulation given his neuropathy, ALS, stroke, fall risk and hence I recommend starting only aspirin 81 mg daily  and stopping plavix but check a CT scan of the head today prior to that. Add Depakote 500 mg twice daily for agitation and continue Haldol as needed. Continue physical outpatient therapy. Return for follow-up in 3 months or call earlier if necessary.

## 2015-09-29 NOTE — Progress Notes (Signed)
[facility] [address] [place]. [place] [address] [phone]       OFFICE FOLLOW-UP NOTE  Mr. Brett Castaneda Date of Birth:  1941-05-21 Medical Record Number:  [id number]   HPI: Brett Castaneda is a 75 year old Caucasian male seen today for the first office follow-up visit following hospital admission in November [id number] for an embolic stroke subsequently followed by multiple medical problems and rehospitalization. He is accompanied today by his wife who provides most of the history. He was initially admitted  towards the end of November 2016 with transient episode of confusion at that time MRI scan showed a tiny diffusion positive lesion in the left temporal lobe felt to be an acute infarct of embolic etiology. Patient had history of peripheral neuropathy, ALS and I felt he was not a good long-term anticoagulant candidate and hence TEE and loop recorder were not done and he was discharged on antiplatelet therapy. He however presented a few weeks later with further episodes of confusion and was found to have positive blood cultures for listeria. Hence a TEE was obtained which showed no evidence of endocarditis but showed an ulcerated aortic arch plaque with was a mobile raising concern for further episodes of embolization hence patient was started on warfarin anticoagulation. He presented a few days later again with confusion and fall and speech CT scan of the head showed bilateral small subdural hematomas. Patient was seen by neurosurgery and not felt to be candidate for surgical evacuation. He was treated with antibiotics for 3 weeks and initially transferred for rehabilitation and currently he is living at [facility] facility for the last 4 days. Patient's wife has noted increasing agitation, confusion, mood swings. He tries to get up and has poor balance and falls. He did have some problems with memory and mood and agitation even prior to his strokes as per his wife  that has not significantly gotten worse. He does have chronic back pain and trouble walking. Review of patient's current medication list shows that he is on Haldol 2 mg every 6 hourly when necessary for agitation. He does work with physical and occupation therapy but is not very cooperative. He has stopped warfarin and is referred to see me to decide on future antiplatelet therapy versus anticoagulation.  ROS:   14 system review of systems is positive for  confusion, agitation, and gait difficulty, vision loss, mood swings, back pain and all other systems negative PMH:  Past Medical History  Diagnosis Date  . BASAL CELL CARCINOMA, FACE 03/31/2009  . 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 CONSTIPATION 06/22/2008  . DIVERTICULOSIS, COLON 03/24/2007  . GERD 03/24/2007  . HYPERLIPIDEMIA 08/27/2007  . HYPERTENSION 12/14/2008  . Rheumatoid arthritis(714.0) 03/24/2007  . Blind right eye   . Retinal detachment     hx of  . Keratosis Oct. 2013  . Fall at home Nov. 5, 2014  06-28-13    in the home and Outside as well  . 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 Stroke [facility]) Oct 2014  . 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 Marital Status: Married    Spouse Name: Brett Castaneda  . Number of Children: 4  . Years of Education: N/A   Occupational History  . retired  Social History Main Topics  . Smoking status: Former Smoker -- 23 years    Types: Cigarettes    Quit date: 09/24/1980  . Smokeless tobacco: Never Used  . Alcohol Use: No  . Drug Use: No  . Sexual Activity: Not on file   Other Topics Concern  . Not on file   Social History Narrative   Lives with wife of 22 years.  They have four children.  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     Medications:   Current  Outpatient Prescriptions on File Prior to Visit  Medication Sig Dispense Refill  . acetaminophen (TYLENOL) 500 MG tablet Take 500 mg by mouth every 6 (six) hours as needed for mild pain, moderate pain or fever.     Marland Kitchen alendronate (FOSAMAX) 70 MG tablet Take 70 mg by mouth once a week. Take with a full glass of water on an empty stomach. Takes on Wednesday    . amLODipine (NORVASC) 5 MG tablet Take 1 tablet (5 mg total) by mouth daily. 30 tablet 0  . folic acid-pyridoxine-cyancobalamin (VIRT-VITE FORTE) 2.5-25-2 MG TABS Take 1 tablet by mouth once a week. Takes on days he takes methotrexate. (Saturdays)    . haloperidol (HALDOL) 2 MG tablet Take 1 tablet (2 mg total) by mouth every 6 (six) hours as needed for agitation.    Marland Kitchen latanoprost (XALATAN) 0.005 % ophthalmic solution Place 1 drop into the left eye at bedtime.     . methotrexate (RHEUMATREX) 2.5 MG tablet Take 4 tablets (10 mg total) by mouth once a week. Resume once patient has completed IV antibiotics.    . Multiple Vitamins-Minerals (OCUVITE ADULT 50+) CAPS Take 1 capsule by mouth daily.    . polyethylene glycol (MIRALAX / GLYCOLAX) packet Take 17 g by mouth daily as needed (for constipation).    . simvastatin (ZOCOR) 40 MG tablet Take 1 tablet (40 mg total) by mouth daily at 6 PM. 30 tablet 0  . sodium chloride (OCEAN) 0.65 % SOLN nasal spray Place 1 spray into both nostrils 3 (three) times daily.    . tamsulosin (FLOMAX) 0.4 MG CAPS capsule Take 0.4 mg by mouth daily.  0  . timolol (TIMOPTIC) 0.5 % ophthalmic solution Place 1 drop into the left eye every evening.      No current facility-administered medications on file prior to visit.    Allergies:   Allergies  Allergen Reactions  . Dopamine Anaphylaxis  . Morphine And Related Other (See Comments)    Hallucinations, very bad reactions  . Vicodin [Hydrocodone-Acetaminophen] Nausea And Vomiting    Physical Exam General: well developed, well nourished, seated, in no evident  distress Head: head normocephalic and atraumatic.  Neck: supple with no carotid or supraclavicular bruits Cardiovascular: regular rate and rhythm, no murmurs Musculoskeletal: no deformity Skin:  no rash/petichiae Vascular:  Normal pulses all extremities Filed Vitals:   09/29/15 0937  BP: 135/87  Pulse: 95   Neurologic Exam Mental Status: Awake and fully alert. Disoriented to place and time. Recent and remote memory diminished. Attention span, concentration and fund of knowledge poor. Mood and affect appropriate. Restless and agitated and wanting to get up needing constant reminders to sit down Cranial Nerves: Fundoscopic exam not done . Blind in the right eye with corneal opacity and small fixed pupil. Right eye esotropia. Left pupil is reactive with intact visual fields. Intact facial sensation. Mild left lower facial weakness when he smiles. Tongue midline. Hearing  is intact. Hearing intact. Facial sensation intact. Face, tongue, palate moves normally and symmetrically.  Motor: Normal bulk and tone except mild distal atrophy in the lower extremities. No fasciculations noted.. Mild distal weakness in both hands intrinsic muscles as well as feet from ankle down.  Sensory.: intact to touch ,pinprick .position and vibratory sensation.  Coordination: Rapid alternating movements normal in all extremities. Finger-to-nose and heel-to-shin performed accurately bilaterally. Gait and Station: Arises from chair with  difficulty. Stance is stooped stooped.  Needs 2 person assist to stand and walk  Reflexes: 1+ and symmetric except ankle jerks are absent bilaterally. Toes downgoing.      ASSESSMENT: 72 year with complicated medical history with previous stroke with residual mild left hemiparesis, hypertension, hyperlipidemia, status post right carotid endarterectomy and diagnosis of ALS and peripheral neuropathy  With recentembolic left temporal lobe infarct   likely secondary to embolization from  mobile aortic arch plaque started on short-term anticoagulation but developed small bilateral subdural hematomas in December 2016 on Coumadin with recent admission for listeria bacteremia and confusion and agitation Patient neurological exam is limited due to his significant agitation and restlessness    PLAN: I had a long discussion with the patient and his wife. The patient appeared quite agitated and restless during this visit limiting evaluation but clearly he does not seem to be a good candidate for anticoagulation given his neuropathy, ALS, stroke, fall risk and hence I recommend starting only aspirin 81 mg daily  and stopping plavix but check a CT scan of the head today prior to that. Add Depakote 500 mg twice daily for agitation and continue Haldol as needed. Continue physical outpatient therapy. This was a prolonged visit with extensive history taking, review of data and medical decision making of high complexity Greater than 50% of time during this 45 minute visit was spent on counseling,explanation of diagnosis, planning of further management, discussion with patient and family and coordination of care Return for follow-up in 3 months or call earlier if necessary. Antony Contras, MD ADDENDUM; I received stat head CT report from [facility] showing stable subdural hematomas without acute blood or increase in size. Antony Contras, MD Note: This document was prepared with digital dictation and possible smart phrase technology. Any transcriptional errors that result from this process are unintentional

## 2015-09-30 ENCOUNTER — Telehealth: Payer: Self-pay | Admitting: *Deleted

## 2015-09-30 DIAGNOSIS — I6200 Nontraumatic subdural hemorrhage, unspecified: Secondary | ICD-10-CM | POA: Diagnosis not present

## 2015-09-30 DIAGNOSIS — F0151 Vascular dementia with behavioral disturbance: Secondary | ICD-10-CM | POA: Diagnosis not present

## 2015-09-30 DIAGNOSIS — I639 Cerebral infarction, unspecified: Secondary | ICD-10-CM | POA: Diagnosis not present

## 2015-09-30 DIAGNOSIS — R410 Disorientation, unspecified: Secondary | ICD-10-CM | POA: Diagnosis not present

## 2015-09-30 DIAGNOSIS — R2689 Other abnormalities of gait and mobility: Secondary | ICD-10-CM | POA: Diagnosis not present

## 2015-09-30 NOTE — Telephone Encounter (Signed)
Called patient's spouse to attempt to reschedule wound re-check appointment that was canceled for 09/15/15.  [facility] requesting call back and gave device clinic phone number.

## 2015-10-04 ENCOUNTER — Ambulatory Visit (INDEPENDENT_AMBULATORY_CARE_PROVIDER_SITE_OTHER): Payer: Medicare Other | Admitting: Internal Medicine

## 2015-10-04 ENCOUNTER — Encounter: Payer: Self-pay | Admitting: Internal Medicine

## 2015-10-04 VITALS — BP 130/90 | HR 70 | Temp 98.4°F | Resp 20 | Ht 71.0 in | Wt 155.0 lb

## 2015-10-04 DIAGNOSIS — I638 Other cerebral infarction: Secondary | ICD-10-CM

## 2015-10-04 DIAGNOSIS — I10 Essential (primary) hypertension: Secondary | ICD-10-CM

## 2015-10-04 DIAGNOSIS — I6389 Other cerebral infarction: Secondary | ICD-10-CM

## 2015-10-04 DIAGNOSIS — I6200 Nontraumatic subdural hemorrhage, unspecified: Secondary | ICD-10-CM

## 2015-10-04 DIAGNOSIS — M0510 Rheumatoid lung disease with rheumatoid arthritis of unspecified site: Secondary | ICD-10-CM

## 2015-10-04 LAB — CBC WITH DIFFERENTIAL/PLATELET
BASOS ABS: 0 10*3/uL (ref 0.0–0.1)
BASOS PCT: 0.3 % (ref 0.0–3.0)
EOS ABS: 0.4 10*3/uL (ref 0.0–0.7)
Eosinophils Relative: 3 % (ref 0.0–5.0)
HEMATOCRIT: 35.1 % — AB (ref 39.0–52.0)
Hemoglobin: 11.6 g/dL — ABNORMAL LOW (ref 13.0–17.0)
LYMPHS ABS: 2 10*3/uL (ref 0.7–4.0)
LYMPHS PCT: 16.5 % (ref 12.0–46.0)
MCHC: 33 g/dL (ref 30.0–36.0)
MCV: 91.8 fl (ref 78.0–100.0)
MONO ABS: 1.7 10*3/uL — AB (ref 0.1–1.0)
Monocytes Relative: 14.4 % — ABNORMAL HIGH (ref 3.0–12.0)
NEUTROS ABS: 7.9 10*3/uL — AB (ref 1.4–7.7)
NEUTROS PCT: 65.8 % (ref 43.0–77.0)
PLATELETS: 315 10*3/uL (ref 150.0–400.0)
RBC: 3.83 Mil/uL — ABNORMAL LOW (ref 4.22–5.81)
RDW: 14.9 % (ref 11.5–15.5)
WBC: 12 10*3/uL — ABNORMAL HIGH (ref 4.0–10.5)

## 2015-10-04 LAB — COMPREHENSIVE METABOLIC PANEL
ALT: 33 U/L (ref 0–53)
AST: 29 U/L (ref 0–37)
Albumin: 3.7 g/dL (ref 3.5–5.2)
Alkaline Phosphatase: 65 U/L (ref 39–117)
BILIRUBIN TOTAL: 0.6 mg/dL (ref 0.2–1.2)
BUN: 15 mg/dL (ref 6–23)
CHLORIDE: 97 meq/L (ref 96–112)
CO2: 26 meq/L (ref 19–32)
CREATININE: 1.16 mg/dL (ref 0.40–1.50)
Calcium: 9.2 mg/dL (ref 8.4–10.5)
GFR: 65.6 mL/min (ref >60.00)
GLUCOSE: 129 mg/dL — AB (ref 70–99)
Potassium: 3.8 mEq/L (ref 3.5–5.1)
SODIUM: 132 meq/L — AB (ref 135–145)
Total Protein: 8.2 g/dL (ref 6.0–8.3)

## 2015-10-04 NOTE — Progress Notes (Signed)
Subjective:    Patient ID: Brett Castaneda, male    DOB: 08-20-1941, 75 y.o.   MRN: [id number]  HPI  Brett Castaneda, is a 75 y.o. male DOB [birth year] MRN [id number].  Admission date: 09/23/2015 Admitting Physician Oswald Hillock, MD  Discharge Date: 09/26/2015    Recommendations for primary care physician for things to follow:   Follow with PCP closely, check CBC and CMP along with a two-view chest x-ray in a week.  Follow final blood culture results which were drawn here and are negative for 3 days.  Needs to see Dr. Leonie Man neurologist on 09/28/2014 he already has an appointment please call a day before and confirm that time.  Needs MRI scan of his head noncontrast on 10/05/2015. Anticoagulation to be resumed by neurology based on his MRI results.   Admission Diagnosis Confusion [R41.0] Fever, unspecified fever cause [R50.9]   Discharge Diagnosis Confusion [R41.0] Fever, unspecified fever cause [R50.9]   Active Problems:  Essential hypertension  Weakness  Dizziness  Fever  Altered awareness, transient  75 year old patient who has a complex past medical history.  He has had 4 hospitalizations since Thanksgiving;  he was discharged on November 29 following a cardioembolic stroke.  He was readmitted for chest pain and syncope, and discharged on December 1.  Hospital admission was, gated by acute renal failure.  A ILR was inserted at that time.  He was readmitted with Listeria bacteremia and discharged on December 7.  He completed 3 weeks of ampicillin.  More recently, he was readmitted with worsening confusion, weakness.  Anticoagulation was discontinued after diagnosis of bilateral small subdural hematomas.  Follow-up head CT was obtained 5 days ago which revealed stable hematomas, chronic small vessel disease and evidence of prior strokes. The patient presently is at [facility] and continues to receive physical therapy.  He continues to be confused and intermittently  quite agitated.  He now is on Depakote and also Haldol when necessary. Medical problems include rheumatoid arthritis and due to all his comorbidities, Methotrexate has recently been discontinued.  He is scheduled for ID follow-up tomorrow. No new problems.  No reported fever.  Periodic agitation presently is the main concern.  Past Medical History  Diagnosis Date  . BASAL CELL CARCINOMA, FACE 03/31/2009  . 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 CONSTIPATION 06/22/2008  . DIVERTICULOSIS, COLON 03/24/2007  . GERD 03/24/2007  . HYPERLIPIDEMIA 08/27/2007  . HYPERTENSION 12/14/2008  . Rheumatoid arthritis(714.0) 03/24/2007  . Blind right eye   . Retinal detachment     hx of  . Keratosis Oct. 2013  . Fall at home Nov. 5, 2014  06-28-13    in the home and Outside as well  . 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 Stroke [facility]) Oct 2014  . 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 Marital Status: Married    Spouse Name: [name]  . Number of Children: 4  . Years of Education: N/A   Occupational History  . retired    Social History Main Topics  . Smoking status: Former Smoker -- 23 years    Types: Cigarettes    Quit date: 09/24/1980  . Smokeless tobacco: Never Used  . Alcohol Use: No  . Drug Use: No  . Sexual Activity: Not on file  Other Topics Concern  . Not on file   Social History Narrative   Lives with wife of 26 years.  They have four children.  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     Past Surgical History  Procedure Laterality Date  . Total knee arthroplasty      bital  . Carpal tunnel release Right 2003  . Cataract extraction  1980    removed cataract in left eye. Currently blind in right eye  . Cholecystectomy  2001    Gall Bladder  . Retinal detachment surgery  2004    . Lumbar puncture  Oct. 21, 2014  . Nerve conduction    . Mri  07-14-13  . Carotid endarterectomy Right 08-15-07    cea  . Joint replacement  2000    Right knee replacement  . Eye surgery  [id number]    retinal detachment  . Eye surgery      left cataract surgery  . Shoulder surgery Left 2009    hx "frozen" shoulder  . Ep implantable device N/A 08/25/2015    Procedure: Loop Recorder Insertion;  Surgeon: Will Meredith Leeds, MD;  Location: [facility];  Service: Cardiovascular;  Laterality: N/A;  . Tee without cardioversion N/A 08/30/2015    Procedure: TRANSESOPHAGEAL ECHOCARDIOGRAM (TEE);  Surgeon: Larey Dresser, MD;  Location: [facility] ENDOSCOPY;  Service: Cardiovascular;  Laterality: N/A;    Family History  Problem Relation Age of Onset  . Stroke Mother 81  . Hypertension Mother   . Heart disease Father   . Aneurysm Father   . Coronary artery disease Sister   . Pneumonia Sister   . Heart disease Sister   . Diabetes Sister   . Hypertension Sister   . Colon cancer Neg Hx     Allergies  Allergen Reactions  . Dopamine Anaphylaxis  . Morphine And Related Other (See Comments)    Hallucinations, very bad reactions  . Vicodin [Hydrocodone-Acetaminophen] Nausea And Vomiting    Current Outpatient Prescriptions on File Prior to Visit  Medication Sig Dispense Refill  . acetaminophen (TYLENOL) 500 MG tablet Take 500 mg by mouth every 6 (six) hours as needed for mild pain, moderate pain or fever.     Marland Kitchen alendronate (FOSAMAX) 70 MG tablet Take 70 mg by mouth once a week. Take with a full glass of water on an empty stomach. Takes on Wednesday    . amLODipine (NORVASC) 5 MG tablet Take 1 tablet (5 mg total) by mouth daily. 30 tablet 0  . aspirin EC 81 MG tablet Take 1 tablet (81 mg total) by mouth daily. 30 tablet 1  . divalproex (DEPAKOTE) 500 MG DR tablet Take 1 tablet (500 mg total) by mouth 2 (two) times daily. 30 tablet 2  . folic acid-pyridoxine-cyancobalamin (VIRT-VITE FORTE)  2.5-25-2 MG TABS Take 1 tablet by mouth once a week. Takes on days he takes methotrexate. (Saturdays)    . haloperidol (HALDOL) 2 MG tablet Take 1 tablet (2 mg total) by mouth every 6 (six) hours as needed for agitation.    Marland Kitchen latanoprost (XALATAN) 0.005 % ophthalmic solution Place 1 drop into the left eye at bedtime.     . methotrexate (RHEUMATREX) 2.5 MG tablet Take 4 tablets (10 mg total) by mouth once a week. Resume once patient has completed IV antibiotics.    . Multiple Vitamins-Minerals (OCUVITE ADULT 50+) CAPS Take 1 capsule by mouth daily.    . polyethylene glycol (MIRALAX /  GLYCOLAX) packet Take 17 g by mouth daily as needed (for constipation).    . simvastatin (ZOCOR) 40 MG tablet Take 1 tablet (40 mg total) by mouth daily at 6 PM. 30 tablet 0  . sodium chloride (OCEAN) 0.65 % SOLN nasal spray Place 1 spray into both nostrils 3 (three) times daily.    . tamsulosin (FLOMAX) 0.4 MG CAPS capsule Take 0.4 mg by mouth daily.  0  . timolol (TIMOPTIC) 0.5 % ophthalmic solution Place 1 drop into the left eye every evening.      No current facility-administered medications on file prior to visit.    BP 130/90 mmHg  Pulse 70  Temp(Src) 98.4 F (36.9 C) (Oral)  Resp 20  Ht 5\' 11"  (1.803 m)  Wt 155 lb (70.308 kg)  BMI 21.63 kg/m2  SpO2 95%      Review of Systems  Constitutional: Positive for activity change, appetite change, fatigue and unexpected weight change. Negative for fever and chills.  HENT: Negative for congestion, dental problem, ear pain, hearing loss, sore throat, tinnitus, trouble swallowing and voice change.   Eyes: Negative for pain, discharge and visual disturbance.  Respiratory: Negative for cough, chest tightness, wheezing and stridor.   Cardiovascular: Negative for chest pain, palpitations and leg swelling.  Gastrointestinal: Negative for nausea, vomiting, abdominal pain, diarrhea, constipation, blood in stool and abdominal distention.  Genitourinary: Negative for  urgency, hematuria, flank pain, discharge, difficulty urinating and genital sores.  Musculoskeletal: Negative for myalgias, back pain, joint swelling, arthralgias, gait problem and neck stiffness.  Skin: Negative for rash.  Neurological: Positive for weakness. Negative for dizziness, syncope, speech difficulty, numbness and headaches.  Hematological: Negative for adenopathy. Does not bruise/bleed easily.  Psychiatric/Behavioral: Positive for suicidal ideas, behavioral problems, confusion, decreased concentration and agitation. Negative for dysphoric mood. The patient is not nervous/anxious.        Objective:   Physical Exam  Constitutional: He is oriented to person, place, and time. He appears well-developed.  Wheelchair bound Agitated Afebrile Tends to sit with his eyes closed tightly Does respond to simple requests Appears weak and malnourished  HENT:  Head: Normocephalic.  Right Ear: External ear normal.  Left Ear: External ear normal.  Eyes: Conjunctivae and EOM are normal.  Neck: Normal range of motion.  Cardiovascular: Normal rate and normal heart sounds.   Pulmonary/Chest: Breath sounds normal. No respiratory distress. He has no wheezes.  O2 saturation 95%  Abdominal: Bowel sounds are normal.  Musculoskeletal: Normal range of motion. He exhibits no edema or tenderness.  Neurological: He is alert and oriented to person, place, and time.  Agitated Generally weak Exam nonfocal No drift of the outstretched arms Able to hold both legs against gravity          Assessment & Plan:   Cerebrovascular disease.  Status post embolic strokes chronic small vessel disease, history of bilateral small subdural hematomas Agitation RA.  Methotrexate.  Presently on hold.  Wife was told to check with ID and rheumatology about resuming Essential hypertension Dyslipidemia.  We'll continue statin therapy History of syncope .  Follow cardiology status post ILR  neurology and ID  follow-up as scheduled  Wife has told to check with rheumatology concerning methotrexate therapy as well as ID

## 2015-10-04 NOTE — Patient Instructions (Addendum)
Return in 3 months for follow-up  Neurology  follow-up as scheduled  Continue active physical therapy  Call for any new or worsening problems

## 2015-10-04 NOTE — Progress Notes (Signed)
Pre visit review using our clinic review tool, if applicable. No additional management support is needed unless otherwise documented below in the visit note. 

## 2015-10-05 ENCOUNTER — Encounter: Payer: Self-pay | Admitting: Infectious Diseases

## 2015-10-05 ENCOUNTER — Ambulatory Visit (INDEPENDENT_AMBULATORY_CARE_PROVIDER_SITE_OTHER): Payer: Medicare Other | Admitting: Infectious Diseases

## 2015-10-05 ENCOUNTER — Encounter: Payer: Self-pay | Admitting: Cardiology

## 2015-10-05 VITALS — BP 133/88 | HR 82 | Temp 98.1°F

## 2015-10-05 DIAGNOSIS — R7881 Bacteremia: Secondary | ICD-10-CM

## 2015-10-05 NOTE — Telephone Encounter (Signed)
Patient's wife returned call.  She states patient was hospitalized during most recent wound check appointment.  Advised wife that we would still like to see patient in office for a follow-up wound check.  She is agreeable to appointment on 10/12/15 at 12:00pm.  Discussed remote appointments and stressed the importance of plugging in Carelink monitor next to patient's bed.  Brett Castaneda states she will plug in monitor this afternoon at [facility], where patient is currently residing.  She is appreciative of instructions and explanations.  She denies additional questions or concerns at this time.

## 2015-10-05 NOTE — Progress Notes (Signed)
   Subjective:    Patient ID: Brett Castaneda, male    DOB: 1940-12-12, 75 y.o.   MRN: [id number]  HPI 75 yo M with hx of RA on MTX, ALS, prev cva (2008, 2014, 07-2015). At his last CVA (11-26 to 11-29) he had imaging which revealed small L parietal lobe ischemic infarct.  He did not receive anti-coagulation (outside of ASA and plavix) or TPA.  He returned 11-30 after a syncopal episode. He was noted to be hypertensive in hospital. He had an implantable loop recorder placed.  He returned 12-2 with worsening dizziness. He also had a syncopal episode, fell and hit his head. In hospital he developed temp 101. 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  He had TEE (- negative for vegetation on eart valve but was noted to have mobile plaque in Aorta). He was treated with ampicillin for 14 days (end date 12-20). He was d/c to SNF on 12-7.   He returned to hospital on 12-30 with small bilateral subdural hematomas due to his coumdain. He was not felt to be an operative candidate. He had repeat BCx in hospital, negative. He had temp 100.4 in ED and was started on vanco/zosyn. These were stopped in hospital. He was d/c to SNF on 09-26-15.  Currently he is staying at [facility]. Saw his PCP yesterday. Notable only for a WBC of 12.0.  Has had no f/c.  Has been hoarse and had some coughing.     Review of Systems  Constitutional: Negative for fever and chills.  HENT: Positive for voice change.   Respiratory: Positive for cough.   Gastrointestinal: Positive for constipation. Negative for diarrhea.  Genitourinary: Negative for difficulty urinating.  Neurological: Negative for headaches.       Objective:   Physical Exam  Constitutional: He appears well-developed and well-nourished.  HENT:  Mouth/Throat: No oropharyngeal exudate.  Eyes: EOM are normal.    Neck: Normal range of motion. Neck supple.  Cardiovascular: Normal rate, regular rhythm and normal heart sounds.     Pulmonary/Chest: Effort normal and breath sounds normal.  Abdominal: Soft. Bowel sounds are normal. There is no tenderness. There is no rebound.  Musculoskeletal: He exhibits edema.  Lymphadenopathy:    He has no cervical adenopathy.      Assessment & Plan:

## 2015-10-05 NOTE — Assessment & Plan Note (Signed)
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  He had repeat BCx 1 week after completing therapy, while having low grade temp.  Will recheck his BCx today.  No further anbx at this point.  He will return to SNF.  rtc prn

## 2015-10-07 ENCOUNTER — Encounter: Payer: Self-pay | Admitting: Family

## 2015-10-11 LAB — CULTURE, BLOOD (SINGLE)
ORGANISM ID, BACTERIA: NO GROWTH
Organism ID, Bacteria: NO GROWTH

## 2015-10-12 ENCOUNTER — Ambulatory Visit: Payer: Medicare Other

## 2015-10-12 ENCOUNTER — Encounter: Payer: Self-pay | Admitting: *Deleted

## 2015-10-12 ENCOUNTER — Ambulatory Visit: Payer: Medicare Other | Admitting: Family

## 2015-10-12 ENCOUNTER — Ambulatory Visit (HOSPITAL_COMMUNITY)
Admission: RE | Admit: 2015-10-12 | Discharge: 2015-10-12 | Disposition: A | Discharge status: Home / Self Care | Payer: Medicare Other | Source: Ambulatory Visit | Attending: Family | Admitting: Family

## 2015-10-12 DIAGNOSIS — Z48812 Encounter for surgical aftercare following surgery on the circulatory system: Secondary | ICD-10-CM

## 2015-10-12 DIAGNOSIS — I714 Abdominal aortic aneurysm, without rupture, unspecified: Secondary | ICD-10-CM

## 2015-10-12 NOTE — Progress Notes (Signed)
Patient had an appointment today for yearly protocol vascular lab studies and to see our NP. When our vascular tech, Juliann Pulse Comfort, called the patient back to the room, she noticed that he was very lethargic. She had difficulty arousing him and he was slumped in his wheelchair. The patient's wife was very concerned and so the tech came over to get the nursing staff. I came over and took VS (BP 135/98 right wrist, Pulse 101. RR 18, Temp 100.3 and O2 sat 95%). I immediately started him on 2L of 02 via [place]. Since the patient was here only for yearly testing, I called [facility] and spoke to his nurse, [place]. She instructed me to call transport to bring him back to them for evaluation. Reeves Forth stated that their doctor was there and would see him immediately upon arrival. She said that the patient was NPO this morning and had no medications since 1715 on 10-11-15 (Tramadol). The patient's wife thought that he may have been given Ativan this morning but this was not the case according to [facility]. The patient had no medication (Depakote, Haldol, Ativan) and he is not a diabetic per wife and [place].  Transport was called and the patient sat in our subwait under supervision from this nurse until he was picked up by [facility]. The patient started to become more oriented with the O2. Wife said that patient had a "terrible cough recently" and she agreed with [place] that her husband "needed to go back to [facility] for assessment for possible pneumonia etc".

## 2015-10-13 DIAGNOSIS — I6200 Nontraumatic subdural hemorrhage, unspecified: Secondary | ICD-10-CM | POA: Diagnosis not present

## 2015-10-13 DIAGNOSIS — R410 Disorientation, unspecified: Secondary | ICD-10-CM | POA: Diagnosis not present

## 2015-10-13 DIAGNOSIS — R5383 Other fatigue: Secondary | ICD-10-CM | POA: Diagnosis not present

## 2015-10-13 DIAGNOSIS — J189 Pneumonia, unspecified organism: Secondary | ICD-10-CM | POA: Diagnosis not present

## 2015-10-14 DIAGNOSIS — I10 Essential (primary) hypertension: Secondary | ICD-10-CM | POA: Diagnosis not present

## 2015-10-14 DIAGNOSIS — R410 Disorientation, unspecified: Secondary | ICD-10-CM | POA: Diagnosis not present

## 2015-10-14 DIAGNOSIS — I6200 Nontraumatic subdural hemorrhage, unspecified: Secondary | ICD-10-CM | POA: Diagnosis not present

## 2015-10-14 DIAGNOSIS — N400 Enlarged prostate without lower urinary tract symptoms: Secondary | ICD-10-CM | POA: Diagnosis not present

## 2015-10-17 ENCOUNTER — Encounter: Payer: Self-pay | Admitting: Cardiology

## 2015-10-17 ENCOUNTER — Telehealth: Payer: Self-pay | Admitting: *Deleted

## 2015-10-17 DIAGNOSIS — R410 Disorientation, unspecified: Secondary | ICD-10-CM | POA: Diagnosis not present

## 2015-10-17 DIAGNOSIS — I6200 Nontraumatic subdural hemorrhage, unspecified: Secondary | ICD-10-CM | POA: Diagnosis not present

## 2015-10-17 DIAGNOSIS — J189 Pneumonia, unspecified organism: Secondary | ICD-10-CM | POA: Diagnosis not present

## 2015-10-17 DIAGNOSIS — I10 Essential (primary) hypertension: Secondary | ICD-10-CM | POA: Diagnosis not present

## 2015-10-17 NOTE — Telephone Encounter (Signed)
[facility] requesting call back.  Gave device clinic phone number.  Patient missed wound re-check appointments on 09/15/15 and 10/11/14.

## 2015-10-21 ENCOUNTER — Ambulatory Visit: Payer: Self-pay | Admitting: Neurology

## 2015-10-21 DIAGNOSIS — R410 Disorientation, unspecified: Secondary | ICD-10-CM | POA: Diagnosis not present

## 2015-10-21 DIAGNOSIS — J189 Pneumonia, unspecified organism: Secondary | ICD-10-CM | POA: Diagnosis not present

## 2015-10-21 DIAGNOSIS — I10 Essential (primary) hypertension: Secondary | ICD-10-CM | POA: Diagnosis not present

## 2015-10-21 DIAGNOSIS — I6200 Nontraumatic subdural hemorrhage, unspecified: Secondary | ICD-10-CM | POA: Diagnosis not present

## 2015-10-24 ENCOUNTER — Ambulatory Visit (INDEPENDENT_AMBULATORY_CARE_PROVIDER_SITE_OTHER): Payer: Medicare Other | Admitting: *Deleted

## 2015-10-24 DIAGNOSIS — R55 Syncope and collapse: Secondary | ICD-10-CM

## 2015-10-24 NOTE — Progress Notes (Signed)
Carelink Summary Report / Loop Recorder 

## 2015-10-28 DIAGNOSIS — R410 Disorientation, unspecified: Secondary | ICD-10-CM | POA: Diagnosis not present

## 2015-10-28 DIAGNOSIS — I6200 Nontraumatic subdural hemorrhage, unspecified: Secondary | ICD-10-CM | POA: Diagnosis not present

## 2015-10-28 DIAGNOSIS — I10 Essential (primary) hypertension: Secondary | ICD-10-CM | POA: Diagnosis not present

## 2015-10-31 DIAGNOSIS — R627 Adult failure to thrive: Secondary | ICD-10-CM | POA: Diagnosis not present

## 2015-11-02 NOTE — Telephone Encounter (Signed)
LMOM requesting call back.  Gave device clinic phone number. 

## 2015-11-04 ENCOUNTER — Ambulatory Visit: Payer: Self-pay | Admitting: Internal Medicine

## 2015-11-04 DIAGNOSIS — I10 Essential (primary) hypertension: Secondary | ICD-10-CM | POA: Diagnosis not present

## 2015-11-04 DIAGNOSIS — I6200 Nontraumatic subdural hemorrhage, unspecified: Secondary | ICD-10-CM | POA: Diagnosis not present

## 2015-11-04 DIAGNOSIS — R410 Disorientation, unspecified: Secondary | ICD-10-CM | POA: Diagnosis not present

## 2015-11-04 DIAGNOSIS — N400 Enlarged prostate without lower urinary tract symptoms: Secondary | ICD-10-CM | POA: Diagnosis not present

## 2015-11-07 NOTE — Telephone Encounter (Signed)
Mrs. Portugal returned call.  She states that patient is at [facility] and has "had a terrible time".  She states that since his hospitalization in November, patient has had many worsening health issues.  Mrs. [place] states she has another important call coming in and she will have to call back later before we were able to discuss wound re-check and tachy episode from 10/15/15.

## 2015-11-09 ENCOUNTER — Telehealth: Payer: Self-pay | Admitting: Internal Medicine

## 2015-11-09 DIAGNOSIS — R451 Restlessness and agitation: Secondary | ICD-10-CM | POA: Diagnosis not present

## 2015-11-09 NOTE — Telephone Encounter (Signed)
FYI

## 2015-11-09 NOTE — Telephone Encounter (Signed)
Wife would like Dr Raliegh Ip to know that pt is coming home from [facility] and Hospice will be contacting  us to set up. Brett Castaneda is with [facility] to set up home care

## 2015-11-09 NOTE — Telephone Encounter (Signed)
Hospice needs a call back stating whether Dr Raliegh Ip is going to be the attending physician for this patient.  Also, does he want Hospice to do symptom management or does he want to do it himself.

## 2015-11-10 LAB — CUP PACEART REMOTE DEVICE CHECK: MDC IDC SESS DTM: 20161231163519

## 2015-11-10 NOTE — Progress Notes (Signed)
Carelink summary report received. Battery status OK. Normal device function. No new symptom episodes, tachy episodes, brady, or pause episodes. No new AF episodes. Monthly summary reports and ROV/PRN 

## 2015-11-10 NOTE — Telephone Encounter (Signed)
Please see message and advise 

## 2015-11-10 NOTE — Telephone Encounter (Signed)
Okay for hospice to do symptomatic management Okay for hospice to be attending physician

## 2015-11-10 NOTE — Telephone Encounter (Signed)
Discussed with Dr. Raliegh Ip again if he would be the attending for pt. Dr.K said yes.  Amy with Hospice called told her Dr.K will be attending and hospice to do symptomatic management. Amy verbalized understanding.

## 2015-11-11 ENCOUNTER — Telehealth: Payer: Self-pay | Admitting: Internal Medicine

## 2015-11-11 DIAGNOSIS — R410 Disorientation, unspecified: Secondary | ICD-10-CM | POA: Diagnosis not present

## 2015-11-11 DIAGNOSIS — I6200 Nontraumatic subdural hemorrhage, unspecified: Secondary | ICD-10-CM | POA: Diagnosis not present

## 2015-11-11 DIAGNOSIS — I10 Essential (primary) hypertension: Secondary | ICD-10-CM | POA: Diagnosis not present

## 2015-11-11 MED ORDER — DIVALPROEX SODIUM 250 MG PO DR TAB: 250.0000 mg | DELAYED_RELEASE_TABLET | Freq: Two times a day (BID) | ORAL | Status: DC

## 2015-11-11 MED ORDER — HALOPERIDOL 2 MG PO TABS: 2.0000 mg | ORAL_TABLET | Freq: Four times a day (QID) | ORAL | Status: AC | PRN

## 2015-11-11 NOTE — Telephone Encounter (Signed)
Brett Castaneda from [facility] and [place] care has admitted the following pt. She call to ask if the following med can be refill. Pt will run out this weekend and she said she did not want him to  [phone]  Pt request refill of the following: haloperidol (HALDOL) 2 MG tablet  ,  divalproex (DEPAKOTE) 500 MG DR tablet this one was change to 250 mg twice a day .Marland Kitchen      Phamacy: [facility]

## 2015-11-11 NOTE — Telephone Encounter (Signed)
Called Helene Kelp, told her Rx's were called into pharmacy for pt. Helene Kelp verbalized understanding.

## 2015-11-13 ENCOUNTER — Encounter: Payer: Self-pay | Admitting: Cardiology

## 2015-11-21 ENCOUNTER — Ambulatory Visit: Payer: PRIVATE HEALTH INSURANCE | Admitting: Neurology

## 2015-11-21 ENCOUNTER — Telehealth: Payer: Self-pay | Admitting: *Deleted

## 2015-11-21 NOTE — Telephone Encounter (Signed)
LMTCB/sss  Needs ov to discuss [place] per Dr.Camnitz.

## 2015-11-22 ENCOUNTER — Telehealth: Payer: Self-pay | Admitting: Internal Medicine

## 2015-11-22 NOTE — Telephone Encounter (Signed)
Rickey Barbara , RN with hospice would like to discuss a med addition for pt. Pt has Increased agitation at night. Although pt is on  haloperidol (HALDOL) 2 MG tablet Pt is still waking at night very agitated.  Renee was thinking perhaps adding Seroquel ,  or whatever Dr Raliegh Ip recommends.   If you cannot get back with her today, renee will be in a meeting from 8:30 am to 10:30 am tomorrow.

## 2015-11-22 NOTE — Telephone Encounter (Signed)
Please see message and advise 

## 2015-11-23 ENCOUNTER — Ambulatory Visit (INDEPENDENT_AMBULATORY_CARE_PROVIDER_SITE_OTHER): Admitting: *Deleted

## 2015-11-23 DIAGNOSIS — R55 Syncope and collapse: Secondary | ICD-10-CM | POA: Diagnosis not present

## 2015-11-23 NOTE — Progress Notes (Signed)
Carelink Summary Report / Loop Recorder 

## 2015-11-25 NOTE — Telephone Encounter (Signed)
Encounter closed.  See phone note from 11/21/15.

## 2015-11-25 NOTE — Telephone Encounter (Signed)
Spoke with patient's wife.  Advised that patient's loop recorder detected AF and that typically, we have patients come into the office to discuss starting anticoagulation.  Gave verbal education about AF and stroke risk.  Advised that after discussing with Dr. Curt Bears, he does not feel that the patient is a candidate for anticoagulation as he has a history of a subdural hematoma and is receiving hospice services.  Patient's wife agrees with Dr. Macky Lower decision to avoid anticoagulation at this time.  She is appreciative of call and explanation of findings.  She states that their daughter, Brett Castaneda, may call with more questions.  Advised that I would be happy to speak with patient's daughter.  [name] again [employer] appreciation of call and denies additional questions or concerns at this time.

## 2015-12-07 ENCOUNTER — Other Ambulatory Visit: Payer: Self-pay | Admitting: *Deleted

## 2015-12-07 MED ORDER — AMLODIPINE BESYLATE 5 MG PO TABS: 5.0000 mg | ORAL_TABLET | Freq: Every day | ORAL | Status: AC

## 2015-12-07 NOTE — Telephone Encounter (Signed)
Received message from [facility] nurse that pt needs refill on Amlodipine 5 mg one tablet daily. Called and left detailed message for her that Rx sent to  Pharmacy and I will let family and pt know. If any questions please call office.  Called and left message on pt's mobile voicemail Rx sent to pharmacy.   Pt's wife [name] called back, told her Rx was sent to pharmacy for pt. Robbie verbalized understanding.

## 2015-12-19 ENCOUNTER — Telehealth: Payer: Self-pay | Admitting: Internal Medicine

## 2015-12-19 MED ORDER — QUETIAPINE FUMARATE 25 MG PO TABS: ORAL_TABLET | ORAL | Status: AC

## 2015-12-19 NOTE — Telephone Encounter (Signed)
ok 

## 2015-12-19 NOTE — Telephone Encounter (Signed)
Left message on pt's voicemail Rx sent to pharmacy as Hospice requested.

## 2015-12-19 NOTE — Telephone Encounter (Signed)
Needs to have seriquil 25 mg at bedtime refilled  [facility] [address], [place] [phone] (Phone) [phone] (Fax)

## 2015-12-19 NOTE — Telephone Encounter (Signed)
Please see message and advise 

## 2015-12-20 NOTE — Telephone Encounter (Signed)
Left message on voicemail calling to make sure got message Rx sent to pharmacy. Please call office.

## 2015-12-20 NOTE — Telephone Encounter (Signed)
Pt's wife called back and said she did get the message about the Rx. Told her okay just wanted to make sure.

## 2015-12-21 ENCOUNTER — Telehealth: Payer: Self-pay | Admitting: Internal Medicine

## 2015-12-21 NOTE — Telephone Encounter (Signed)
Renee from Hospice call to say she need to speak with someone about the following med divalproex (DEPAKOTE) 250 MG DR tablet   [place] with Hospice

## 2015-12-22 NOTE — Telephone Encounter (Signed)
Called Renee with Hospice, wanting to know if Dr. Raliegh Ip can change Depakote to a capsule/sprinkles it comes in 125 mg so that they can open and give to pt with applesauce because he is having a hard time swallowing large pill. Told Renee Dr. Raliegh Ip is out of the office today and I will check with him first thing in the morning and call you back. Renee verbalized understanding.

## 2015-12-23 ENCOUNTER — Ambulatory Visit (INDEPENDENT_AMBULATORY_CARE_PROVIDER_SITE_OTHER): Admitting: *Deleted

## 2015-12-23 DIAGNOSIS — R55 Syncope and collapse: Secondary | ICD-10-CM

## 2015-12-23 MED ORDER — DIVALPROEX SODIUM 125 MG PO CSDR: 250.0000 mg | DELAYED_RELEASE_CAPSULE | Freq: Two times a day (BID) | ORAL | Status: AC

## 2015-12-23 NOTE — Telephone Encounter (Signed)
Spoke to Brett Castaneda, told her Dr.K said it was fine to change Depakote to sprinkles. Rx sent to pharmacy. Brett Castaneda verbalized understanding. Asked Brett Castaneda if she would like me to let the family know? She said yes, if I have time. Told her no problem I will call. Brett Castaneda verbalized understanding.  Called pt and left detailed message on voicemail regarding Rx changed for Depakote and sent to pharmacy. Also I contacted Brett Castaneda the Hospice nurse and she is aware. Any questions please call office.

## 2015-12-23 NOTE — Progress Notes (Signed)
Carelink Summary Report / Loop Recorder 

## 2015-12-23 NOTE — Addendum Note (Signed)
Addended by: Marian Sorrow on: 12/23/2015 01:32 PM   Modules accepted: Orders

## 2015-12-23 NOTE — Telephone Encounter (Signed)
Please see message and advise 

## 2015-12-23 NOTE — Telephone Encounter (Signed)
Okay to change formulation

## 2015-12-26 ENCOUNTER — Telehealth: Payer: Self-pay | Admitting: Internal Medicine

## 2015-12-26 NOTE — Telephone Encounter (Signed)
Renee from Hospice [phone]) was called to the patients house for an emergency and he is complaining that his lungs hurting, aspirating when he drinks, hallucination and confusion.

## 2015-12-26 NOTE — Telephone Encounter (Signed)
Called Renee back told her discussed pt symptoms with Dr.K, he said pt can go to ED if family wants if unable to control pain at home,but he does not know there care of plan right now. If pt comfort care only then treat symptomatic only. Renee verbalized understanding and said they wanted to know if they could thicken liquids. Told her that would be fine. Renee verbalized understanding and said she will contact Hospice doctor for change in pain medication order. Told her okay.

## 2015-12-27 ENCOUNTER — Ambulatory Visit: Payer: Self-pay | Admitting: General Practice

## 2016-01-02 ENCOUNTER — Telehealth: Payer: Self-pay | Admitting: Neurology

## 2016-01-02 NOTE — Telephone Encounter (Signed)
Rn call patients wife [name] back about her husband being in hospice care. Rn stated Dr. Leonie Man will be sent a message about he is in hospice care and that the appt was cancel 4/.13/.2017.

## 2016-01-02 NOTE — Telephone Encounter (Signed)
Patient's wife called to cancel patient's appt on 4/13.  She states her husband is with Hospice and in his last days.  She wanted to thank Dr. Leonie Man for his excellent care to her husband.  Thanks!

## 2016-01-02 NOTE — Telephone Encounter (Signed)
thanks

## 2016-01-03 LAB — CUP PACEART REMOTE DEVICE CHECK: Date Time Interrogation Session: 20170130170556

## 2016-01-05 ENCOUNTER — Ambulatory Visit: Payer: Medicare Other | Admitting: Neurology

## 2016-01-05 ENCOUNTER — Telehealth: Payer: Self-pay | Admitting: Internal Medicine

## 2016-01-05 NOTE — Telephone Encounter (Signed)
Patient passed away this morning at 8:50 am at home.

## 2016-01-09 NOTE — Telephone Encounter (Signed)
FYI

## 2016-01-09 NOTE — Telephone Encounter (Signed)
Pt wife is calling to thank dr Raliegh Ip for everything

## 2016-01-20 ENCOUNTER — Telehealth: Payer: Self-pay | Admitting: Cardiology

## 2016-01-20 NOTE — Telephone Encounter (Signed)
Pt wife informed me that pt passed away on Jan 18, 2016. She wanted to know if the remote transmission from that gave a time. I attempted to look for this information but was unable to find it. Pt wife verbalized understanding. Return kit ordered for pt wife to send home monitor back.

## 2016-01-20 NOTE — Telephone Encounter (Signed)
LMOVM requesting that pt send manual transmission b/c home monitor has not updated in at least 14 days.    

## 2016-01-23 DEATH — deceased

## 2016-02-01 ENCOUNTER — Ambulatory Visit: Payer: Self-pay | Admitting: Internal Medicine

## 2016-02-14 LAB — CUP PACEART REMOTE DEVICE CHECK
Date Time Interrogation Session: 20170331173521
MDC IDC SESS DTM: 20170301170621

## 2016-03-16 IMAGING — CT CT HEAD W/O CM
2 series · 15 of 30 positions shown, 19 images · non-contrast
Comparison: 08/23/2015.

ADDENDUM:
Critical Value/emergent results were called by me at the time of
interpretation on 09/23/2015 at 9000 hours to Dr. KLPIGBB MOOLMAN , who
verbally acknowledged these results.
CLINICAL DATA: Initial encounter for confusion and lethargy that
started last night and got worse today.

EXAM:
CT HEAD WITHOUT CONTRAST
TECHNIQUE: Contiguous axial images were obtained from the base of the skull
through the vertex without intravenous contrast.

[Series 201: head w/o, idose (1) · axial · non-contrast · 0.49mm/px · z∈[+88,+223]mm · 13 of 33 slices shown, 17 images]
[im 3/33  brain]
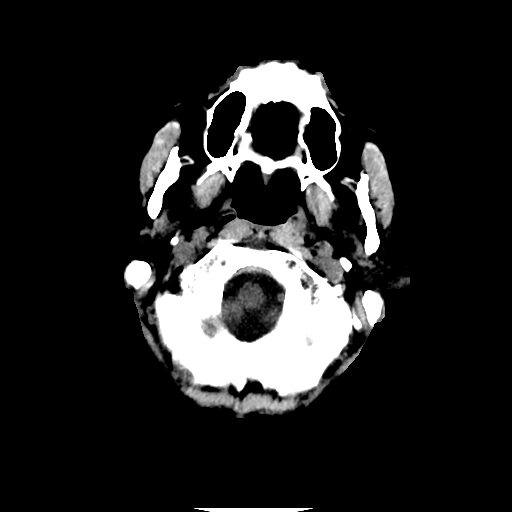
[im 3/33  bone]
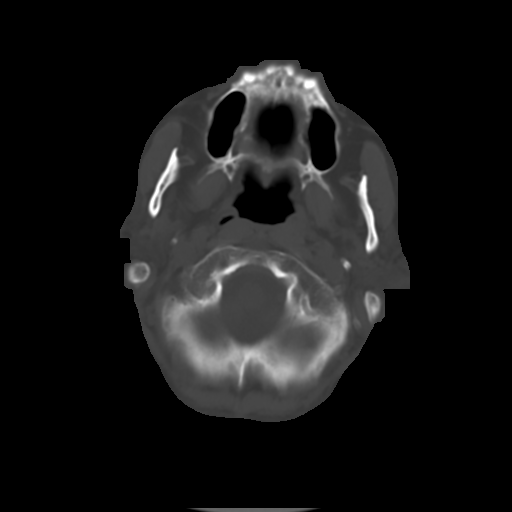
[im 5/33  brain]
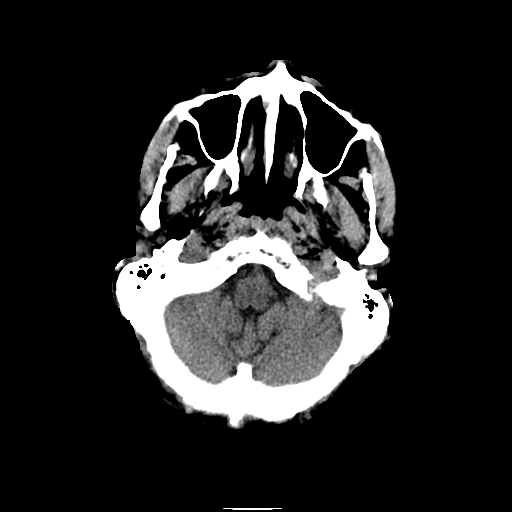
[im 7/33  brain]
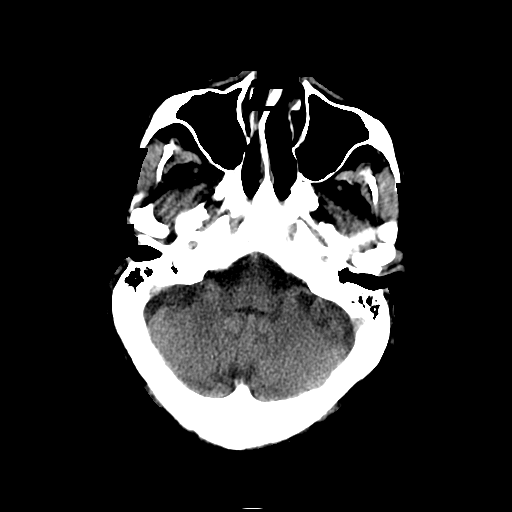
[im 10/33  brain]
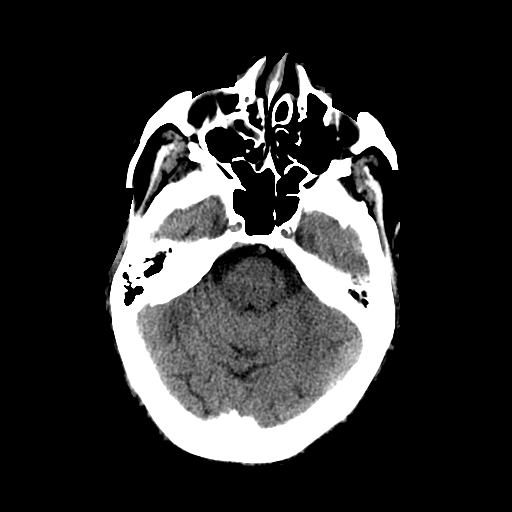
[im 12/33  brain]
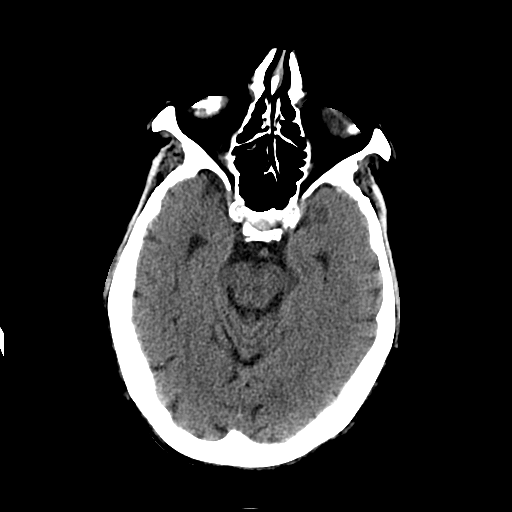
[im 12/33  bone]
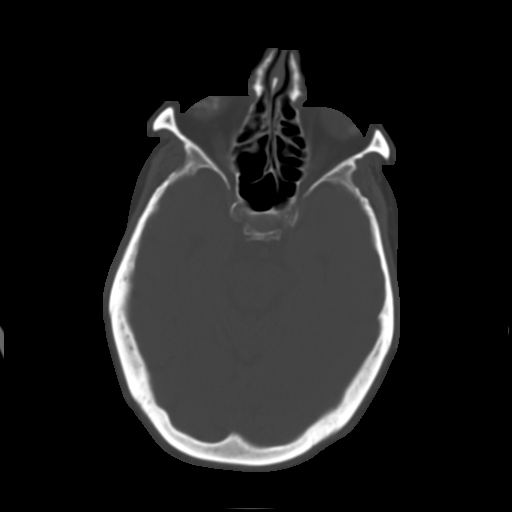
[im 14/33  brain]
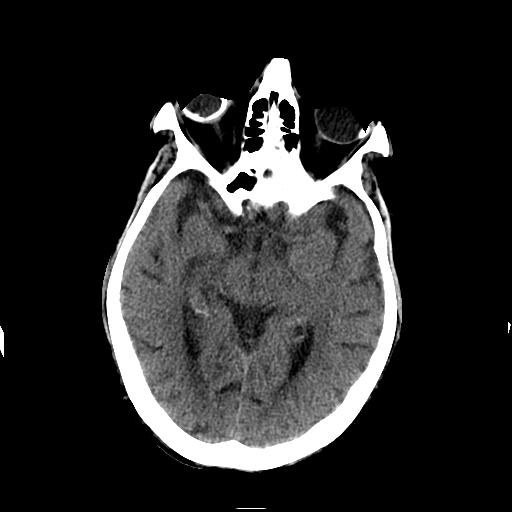
[im 17/33  brain]
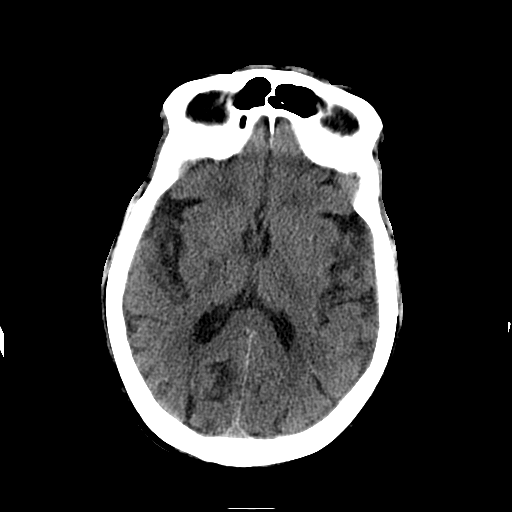
[im 19/33  brain]
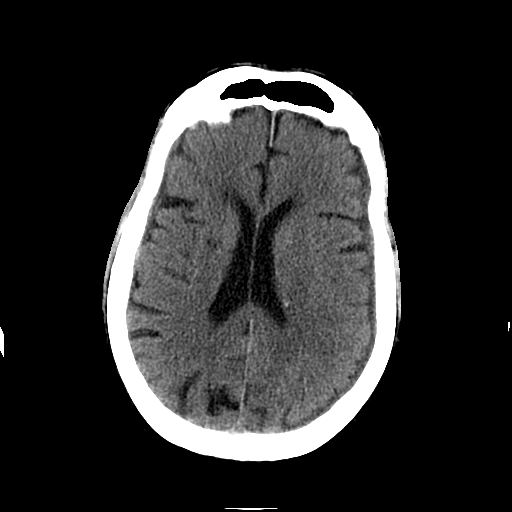
[im 21/33  brain]
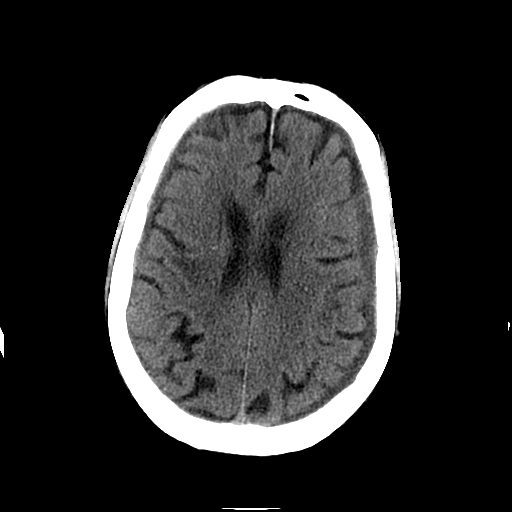
[im 21/33  bone]
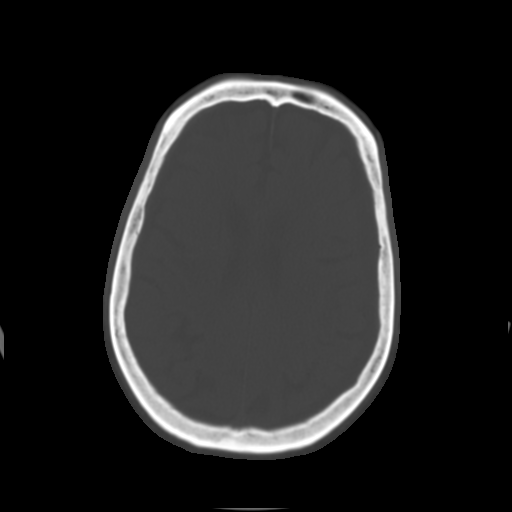
[im 23/33  brain]
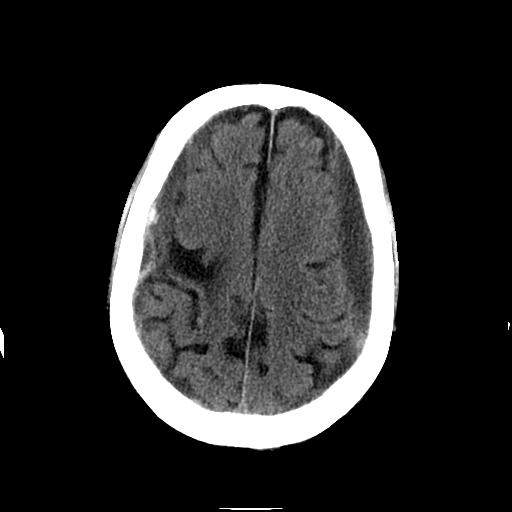
[im 26/33  brain]
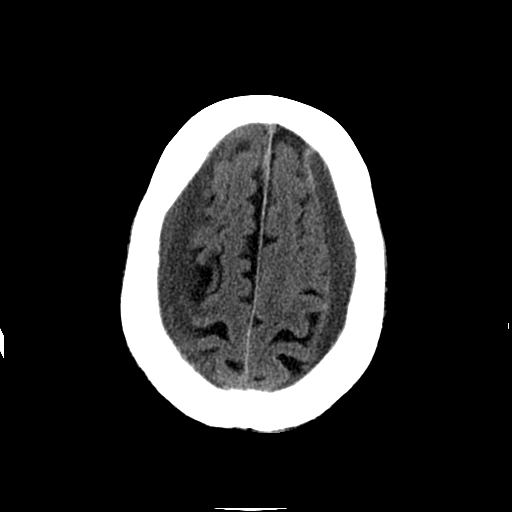
[im 28/33  brain]
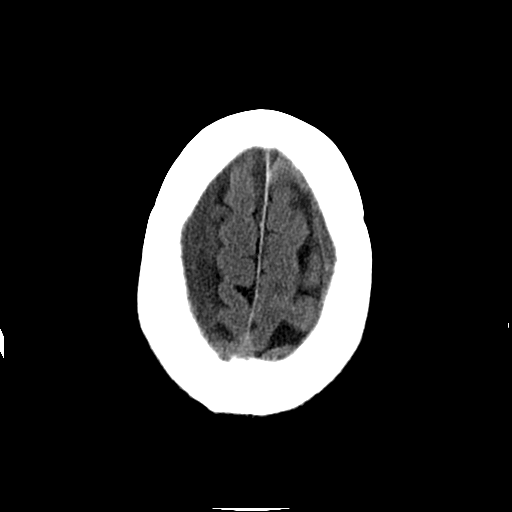
[im 30/33  brain]
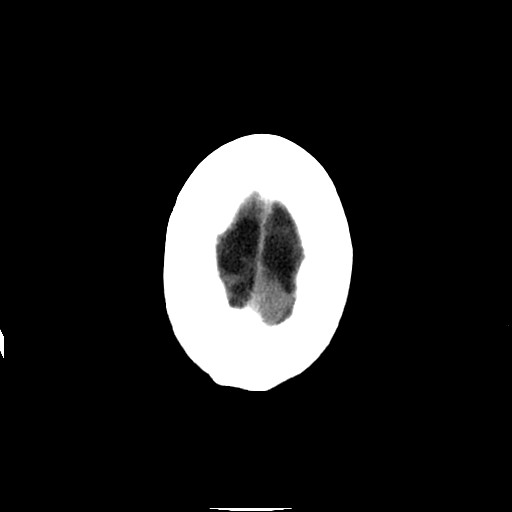
[im 30/33  bone]
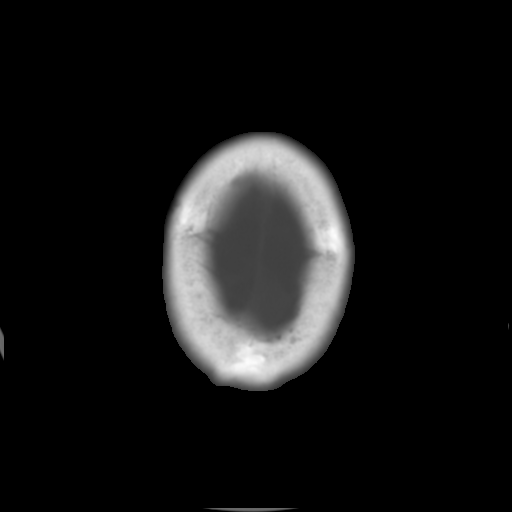

[Series 202: head w/o bone, idose (1) · axial · non-contrast · 0.49mm/px · z∈[+88,+108]mm · 2 of 33 slices shown]
[im 3/33  bone]
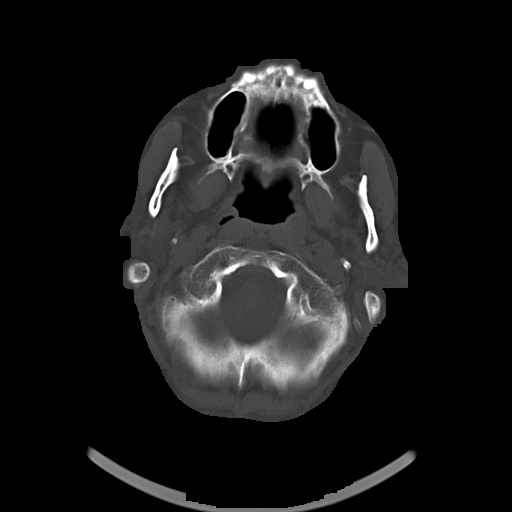
[im 7/33  bone]
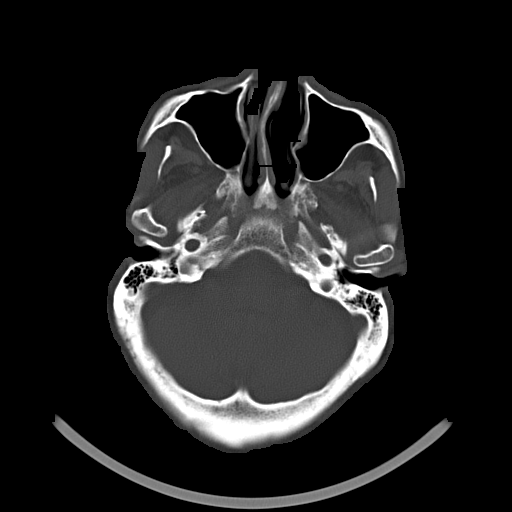

[15 of 30 positions shown; findings below may reference images not displayed]

FINDINGS: In the interval since the prior study, the patient has developed
bilateral subacute to chronic subdural hematomas. On the left, this
is extending over the frontal parietal lobes and measures up to
about 16 mm in thickness. Although the bulk of the extra-axial
hemorrhages low-attenuation, there is some wispy areas of increased
attenuation which may be related to a trace amount of acute on
chronic hemorrhage.

Similar right frontal parietal acute subacute to chronic subdural
hematoma extends up to the convexity. This has slightly more hyper
attenuating fluid in it in a degree of acute on chronic subdural
hemorrhage is suspected.

There is approximately 3 mm of left-to-right midline shift.

No evidence for hydrocephalus. No intra-axial mass lesion is
evident. High right frontoparietal encephalomalacia again noted.
Patchy low attenuation in the deep hemispheric and periventricular
white matter is nonspecific, but likely reflects chronic
microvascular ischemic demyelination.

Postsurgical change noted in both globes The visualized paranasal
sinuses and mastoid air cells are clear.
IMPRESSION: Bilateral frontoparietal subdural hematomas extend up to the
convexity. Bulk of the extra-axial fluid on today's study is low
attenuation suggesting chronicity although there is some minimal
wispy areas of high density material within the fluid collection
suggesting acute on chronic hemorrhage. Left subdural is slightly
larger and results and 3 mm left to right midline shift.

Atrophy with chronic small vessel white matter ischemic
demyelination.

## 2016-03-17 IMAGING — CR DG CHEST 2V
2 series · 2 of 2 positions shown · non-contrast
Comparison: 09/23/2015

CLINICAL DATA: Fever, fatigue, and cough.

EXAM:
CHEST  2 VIEW

[chest lat]
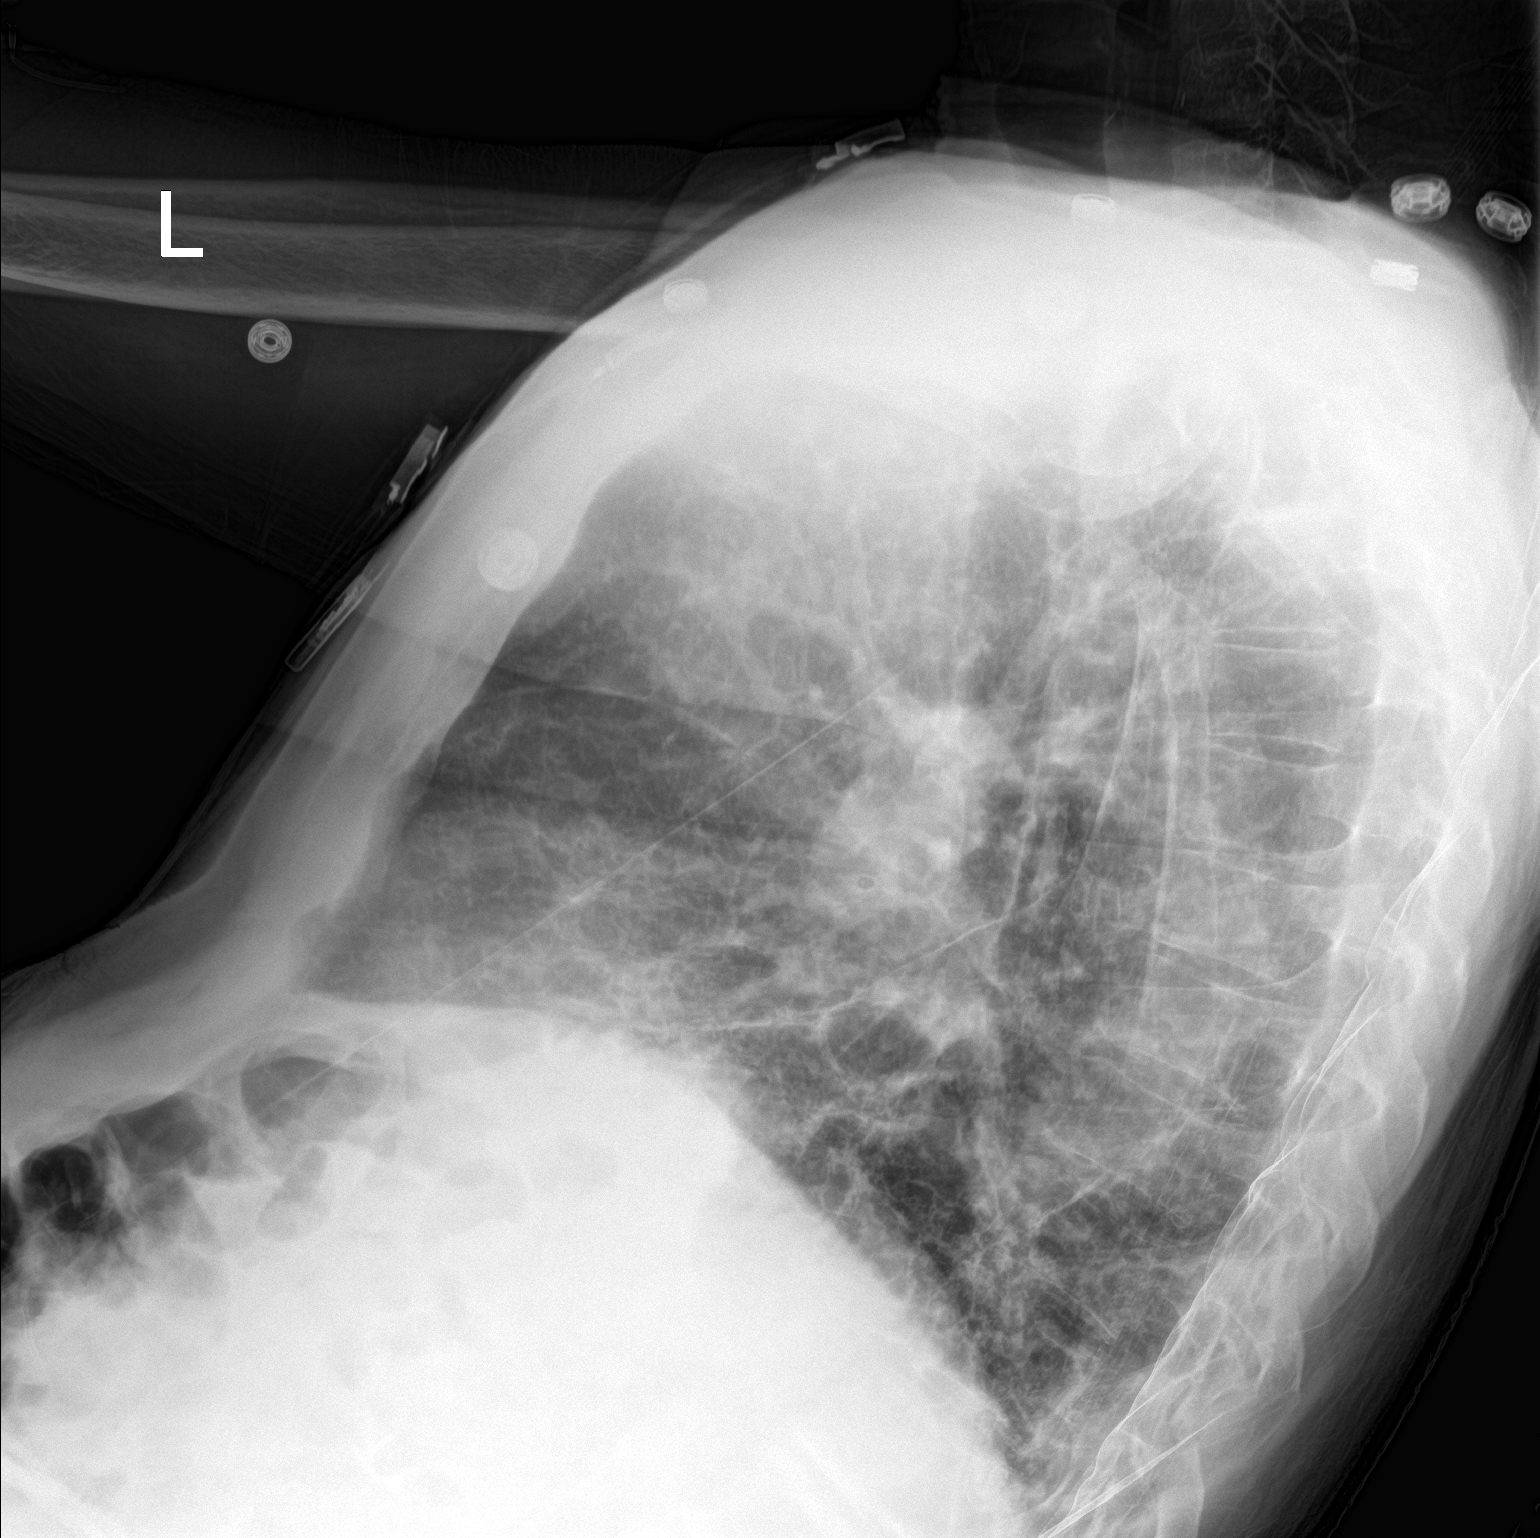

[chest ap]
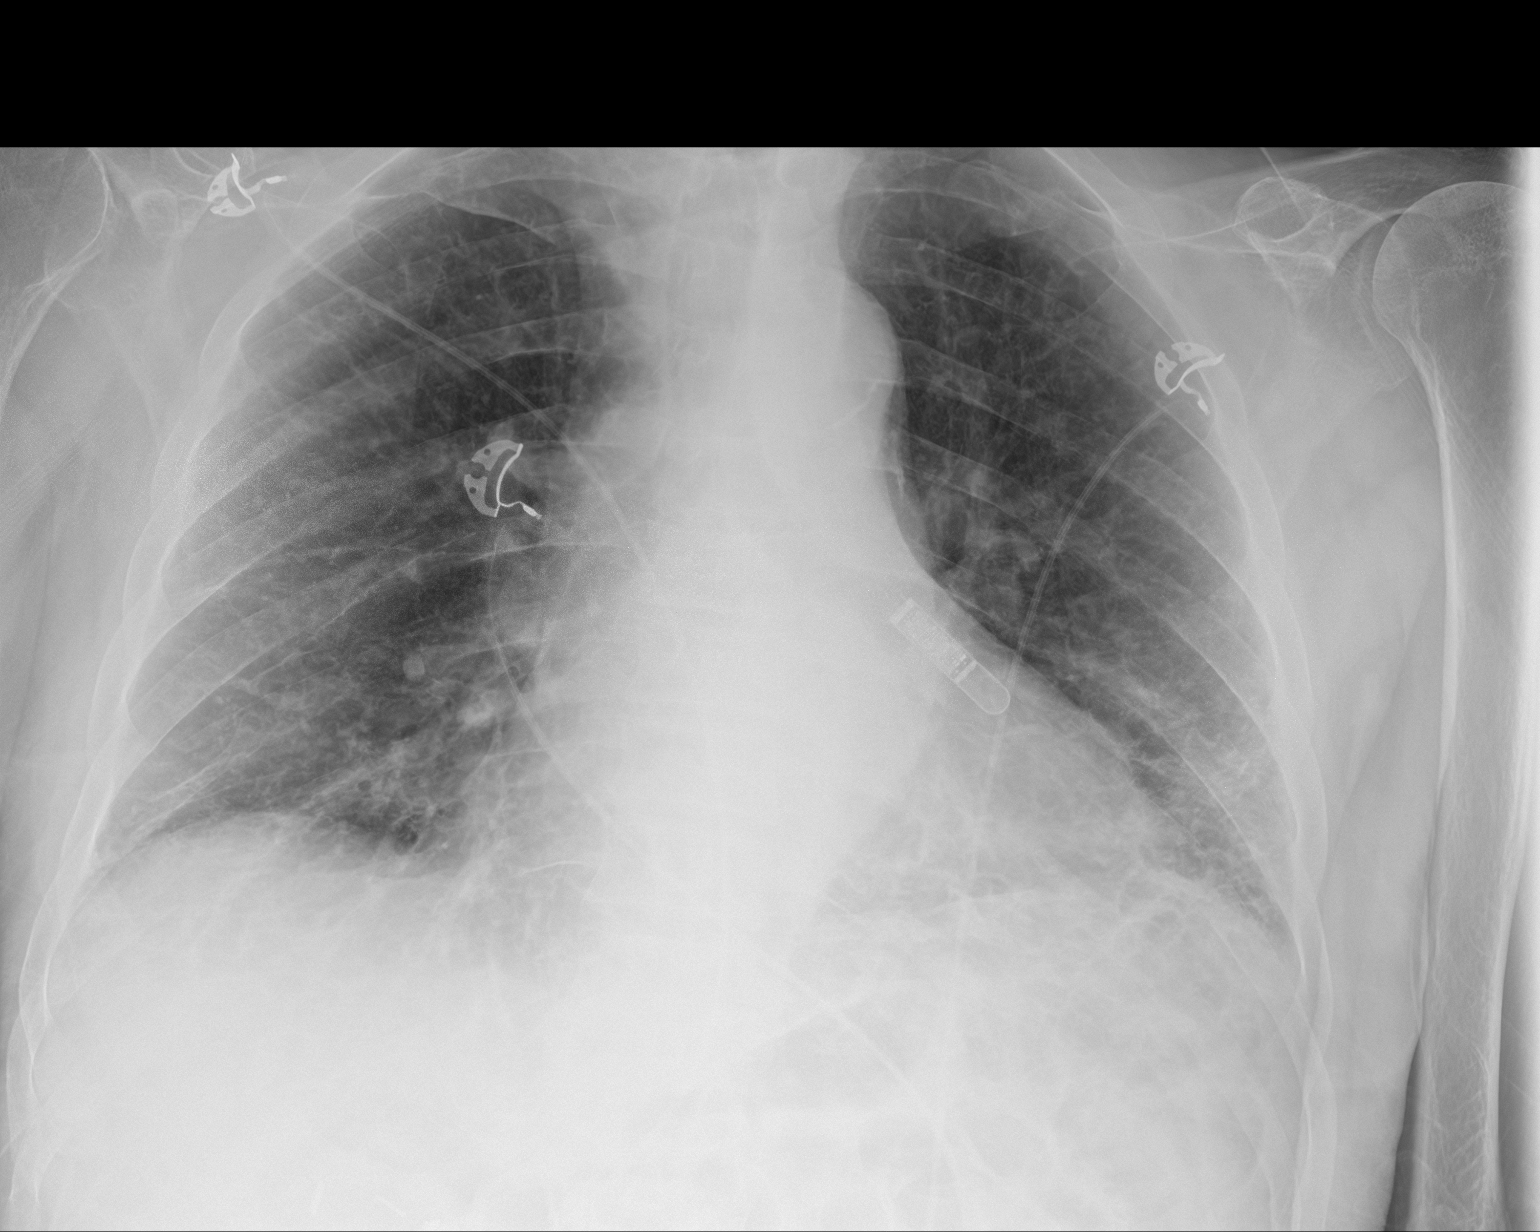

[2 of 2 positions shown; findings below may reference images not displayed]

FINDINGS: Heart is mildly enlarged. There is prominence of interstitial
markings, stable compared prior study. Loop recorder visualized over
the cardiac apex. There are no new focal consolidations or pleural
effusions. Surgical clips are noted in the upper abdomen .
IMPRESSION: 1. Cardiomegaly.
2. Stable interstitial prominence.
